# Patient Record
Sex: Male | Born: 1940 | Race: White | Hispanic: No | Smoking: Former smoker
Health system: Southern US, Community
[De-identification: ages and names within clinical notes are randomized; demographics above are authoritative.]

## PROBLEM LIST (undated history)

## (undated) DIAGNOSIS — Z87898 Personal history of other specified conditions: Secondary | ICD-10-CM

## (undated) DIAGNOSIS — R600 Localized edema: Secondary | ICD-10-CM

## (undated) DIAGNOSIS — H9190 Unspecified hearing loss, unspecified ear: Secondary | ICD-10-CM

## (undated) DIAGNOSIS — N183 Chronic kidney disease, stage 3 unspecified: Secondary | ICD-10-CM

## (undated) DIAGNOSIS — Z923 Personal history of irradiation: Secondary | ICD-10-CM

## (undated) DIAGNOSIS — E119 Type 2 diabetes mellitus without complications: Secondary | ICD-10-CM

## (undated) DIAGNOSIS — D649 Anemia, unspecified: Secondary | ICD-10-CM

## (undated) DIAGNOSIS — Z8719 Personal history of other diseases of the digestive system: Secondary | ICD-10-CM

## (undated) DIAGNOSIS — I872 Venous insufficiency (chronic) (peripheral): Secondary | ICD-10-CM

## (undated) DIAGNOSIS — M1990 Unspecified osteoarthritis, unspecified site: Secondary | ICD-10-CM

## (undated) DIAGNOSIS — F1011 Alcohol abuse, in remission: Secondary | ICD-10-CM

## (undated) DIAGNOSIS — I10 Essential (primary) hypertension: Secondary | ICD-10-CM

## (undated) DIAGNOSIS — C61 Malignant neoplasm of prostate: Secondary | ICD-10-CM

## (undated) HISTORY — DX: Essential (primary) hypertension: I10

## (undated) HISTORY — PX: TOTAL KNEE ARTHROPLASTY: SHX125

## (undated) HISTORY — DX: Venous insufficiency (chronic) (peripheral): I87.2

## (undated) HISTORY — PX: TONSILLECTOMY: SUR1361

---

## 1998-11-27 ENCOUNTER — Encounter: Admission: RE | Admit: 1998-11-27 | Discharge: 1998-11-27 | Payer: Self-pay | Admitting: Internal Medicine

## 2002-04-27 ENCOUNTER — Ambulatory Visit (HOSPITAL_COMMUNITY): Admission: RE | Admit: 2002-04-27 | Discharge: 2002-04-27 | Payer: Self-pay | Admitting: Internal Medicine

## 2002-04-27 ENCOUNTER — Encounter: Payer: Self-pay | Admitting: Internal Medicine

## 2004-04-17 ENCOUNTER — Ambulatory Visit (HOSPITAL_COMMUNITY): Admission: RE | Admit: 2004-04-17 | Discharge: 2004-04-17 | Payer: Self-pay | Admitting: General Surgery

## 2004-10-02 ENCOUNTER — Ambulatory Visit: Payer: Self-pay | Admitting: Gastroenterology

## 2004-11-07 ENCOUNTER — Ambulatory Visit (HOSPITAL_COMMUNITY): Admission: RE | Admit: 2004-11-07 | Discharge: 2004-11-07 | Payer: Self-pay | Admitting: Obstetrics and Gynecology

## 2004-11-07 ENCOUNTER — Encounter (INDEPENDENT_AMBULATORY_CARE_PROVIDER_SITE_OTHER): Payer: Self-pay | Admitting: Specialist

## 2005-10-21 ENCOUNTER — Encounter (INDEPENDENT_AMBULATORY_CARE_PROVIDER_SITE_OTHER): Payer: Self-pay | Admitting: Specialist

## 2006-01-31 ENCOUNTER — Ambulatory Visit: Payer: Self-pay | Admitting: Gastroenterology

## 2009-09-07 ENCOUNTER — Ambulatory Visit (HOSPITAL_COMMUNITY): Admission: RE | Admit: 2009-09-07 | Discharge: 2009-09-07 | Payer: Self-pay | Admitting: Family Medicine

## 2009-12-25 ENCOUNTER — Encounter: Payer: Self-pay | Admitting: Infectious Diseases

## 2009-12-27 ENCOUNTER — Inpatient Hospital Stay (HOSPITAL_COMMUNITY): Admission: RE | Admit: 2009-12-27 | Discharge: 2010-01-01 | Payer: Self-pay | Admitting: Orthopedic Surgery

## 2009-12-29 HISTORY — PX: TOTAL KNEE  PROSTHESIS REMOVAL W/ SPACER INSERTION: SHX2541

## 2009-12-30 ENCOUNTER — Ambulatory Visit: Payer: Self-pay | Admitting: Infectious Diseases

## 2010-01-01 ENCOUNTER — Inpatient Hospital Stay: Admission: AD | Admit: 2010-01-01 | Discharge: 2010-02-15 | Payer: Self-pay | Admitting: Internal Medicine

## 2010-01-04 ENCOUNTER — Ambulatory Visit: Payer: Self-pay | Admitting: Infectious Diseases

## 2010-01-04 DIAGNOSIS — E119 Type 2 diabetes mellitus without complications: Secondary | ICD-10-CM

## 2010-01-04 DIAGNOSIS — M86669 Other chronic osteomyelitis, unspecified tibia and fibula: Secondary | ICD-10-CM | POA: Insufficient documentation

## 2010-01-10 ENCOUNTER — Encounter (INDEPENDENT_AMBULATORY_CARE_PROVIDER_SITE_OTHER): Payer: Self-pay | Admitting: *Deleted

## 2010-01-10 DIAGNOSIS — Z8546 Personal history of malignant neoplasm of prostate: Secondary | ICD-10-CM

## 2010-01-10 DIAGNOSIS — I10 Essential (primary) hypertension: Secondary | ICD-10-CM

## 2010-02-21 ENCOUNTER — Ambulatory Visit: Payer: Self-pay | Admitting: Infectious Diseases

## 2010-03-16 ENCOUNTER — Inpatient Hospital Stay (HOSPITAL_COMMUNITY): Admission: RE | Admit: 2010-03-16 | Discharge: 2010-03-19 | Payer: Self-pay | Admitting: Orthopedic Surgery

## 2010-03-16 HISTORY — PX: REIMPLANTATION OF TOTAL KNEE: SHX6052

## 2010-06-08 ENCOUNTER — Ambulatory Visit (HOSPITAL_COMMUNITY): Admission: RE | Admit: 2010-06-08 | Discharge: 2010-06-08 | Payer: Self-pay | Admitting: Family Medicine

## 2010-07-29 ENCOUNTER — Encounter (HOSPITAL_BASED_OUTPATIENT_CLINIC_OR_DEPARTMENT_OTHER): Payer: Self-pay | Admitting: Internal Medicine

## 2010-08-07 NOTE — Miscellaneous (Signed)
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 d/c summary

## 2010-08-07 NOTE — Assessment & Plan Note (Signed)
Summary: per Gunnar Fusi 6wks f/u [mkj]   CC:  6 weeks follow up.  History of Present Illness: 70  yo M with left total knee arthroplasty that was   performed in 2007.  In 2009 the knee became infected.  It was cleaned   out arthroscopically and did well following that.  According to records   and cultures were strep and at the time they thought that this was a contaminant.  The patient was given vancomycin.  He was then seen in Dr Jeannetta Ellis office with    2 week of swelling  and redness about the left knee that is becoming progressively worse.   The patient denied any constitutional symptoms such as fever. He was adm 12-27-09 and had his prosthesis resected 12-30-09. His Cx grew 12-25-09 Streptococcus galolyticus (s-pen) from the office.  He was d/c home on 6-27-11with IV ceftriaxone.  His hospital course was further complicated by the new dx of Diabetes Mellitus.  ESR 113 and CRP 14.8  NO fevers or chills. No wound drainage. off antibiotics. Not wt bearing yet. Has f/u with Dr Darrelyn Hillock 02-23-10.   Preventive Screening-Counseling & Management  Alcohol-Tobacco     Alcohol drinks/day: 0     Smoking Status: never  Caffeine-Diet-Exercise     Caffeine use/day: sodas     Does Patient Exercise: yes     Type of exercise: waiting to start PT  Safety-Violence-Falls     Seat Belt Use: yes  Current Medications (verified): 1)  Torsemide 20 Mg Tabs (Torsemide) .... Take One Tablet By Mouth Daily 2)  Klor-Con 10 10 Meq Cr-Tabs (Potassium Chloride) .... Take One Tablet By Mouth Daily 3)  Coumadin 5 Mg Tabs (Warfarin Sodium) .... Take One Tablet By Mouth Daily 4)  Lantus 100 Unit/ml Soln (Insulin Glargine) .... 20 Units Subcu Q Pm 5)  Metformin Hcl 500 Mg Tabs (Metformin Hcl) .... Take One Tablet By Mouth Twice A Day 6)  Oxycodone-Acetaminophen 10-650 Mg Tabs (Oxycodone-Acetaminophen) .... Take One Tablet By Mouth Every 4 Hours As Needed For Pain 7)  Robaxin 500 Mg Tabs (Methocarbamol) .... Take One Tablet  By Mouth Three Times A Day For Muscle Spasms 8)  Klor-Con M10 10 Meq Cr-Tabs (Potassium Chloride Crys Cr) .... Take 1 Tablet By Mouth Once A Day 9)  Cozaar 100 Mg Tabs (Losartan Potassium) .... Take 1 Tablet By Mouth Once A Day 10)  Coumadin 4 Mg Tabs (Warfarin Sodium) .... 2 Tab At Bedtime 11)  Oxycontin 10 Mg Xr12h-Tab (Oxycodone Hcl) .... Take 1 Tablet By Mouth Two Times A Day  Allergies (verified): No Known Drug Allergies    Updated Prior Medication List: TORSEMIDE 20 MG TABS (TORSEMIDE) Take one tablet by mouth daily KLOR-CON 10 10 MEQ CR-TABS (POTASSIUM CHLORIDE) Take one tablet by mouth daily COUMADIN 5 MG TABS (WARFARIN SODIUM) Take one tablet by mouth daily LANTUS 100 UNIT/ML SOLN (INSULIN GLARGINE) 20 units subcu q pm METFORMIN HCL 500 MG TABS (METFORMIN HCL) Take one tablet by mouth twice a day OXYCODONE-ACETAMINOPHEN 10-650 MG TABS (OXYCODONE-ACETAMINOPHEN) Take one tablet by mouth every 4 hours as needed for pain ROBAXIN 500 MG TABS (METHOCARBAMOL) Take one tablet by mouth three times a day for muscle spasms KLOR-CON M10 10 MEQ CR-TABS (POTASSIUM CHLORIDE CRYS CR) Take 1 tablet by mouth once a day COZAAR 100 MG TABS (LOSARTAN POTASSIUM) Take 1 tablet by mouth once a day COUMADIN 4 MG TABS (WARFARIN SODIUM) 2 tab at bedtime OXYCONTIN 10 MG XR12H-TAB (OXYCODONE HCL) Take 1  tablet by mouth two times a day  Current Allergies (reviewed today): No known allergies  Review of Systems       FSGs run 108-140.   Vital Signs:  Patient profile:   70 year old male Height:      0.0 inches (0 cm) Weight:      000.0 pounds (0 kg) Temp:     98.8 degrees F (37.11 degrees C) oral Pulse rate:   89 / minute BP sitting:   119 / 71  (left arm)  Vitals Entered By: Baxter Hire) (February 21, 2010 11:06 AM) CC: 6 weeks follow up Pain Assessment Patient in pain? yes     Location: left knee Intensity: 3 Type: aching Onset of pain  pain gets worse at night Nutritional Status  Detail appetite is great per patient  Does patient need assistance? Functional Status Self care Ambulation Wheelchair   Physical Exam  General:  well-developed, well-nourished, well-hydrated, and overweight-appearing.   Extremities:  L knee with  ~5cm wound. there is some yellowness of the skin at the inferior portion but no d/c. It is non-tender. there is no fluctuance or effusion.    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 will check his ESR and CRP. If these are normal would suggest he can procede to reimplantation. If they are elavated, would suggest 3-6 months of by mouth antibiotics (amoxil or keflx or levaquin or avelox). He will return to clinic if his labs are abnormal.  The following medications were removed from the medication list:    Rocephin 1 Gm Solr (Ceftriaxone sodium) .Marland Kitchen... Pic line q 24 hours His updated medication list for this problem includes:    Oxycodone-acetaminophen 10-650 Mg Tabs (Oxycodone-acetaminophen) .Marland Kitchen... Take one tablet by mouth every 4 hours as needed for pain    Oxycontin 10 Mg Xr12h-tab (Oxycodone hcl) .Marland Kitchen... Take 1 tablet by mouth two times a day  Orders: Est. Patient Level III (03474) T-C-Reactive Protein (25956-38756) T-Sed Rate (Automated) (43329-51884)  Medications Added to Medication List This Visit: 1)  Klor-con M10 10 Meq Cr-tabs (Potassium chloride crys cr) .... Take 1 tablet by mouth once a day 2)  Cozaar 100 Mg Tabs (Losartan potassium) .... Take 1 tablet by mouth once a day 3)  Coumadin 4 Mg Tabs (Warfarin sodium) .... 2 tab at bedtime 4)  Oxycontin 10 Mg Xr12h-tab (Oxycodone hcl) .... Take 1 tablet by mouth two times a day  Prevention & Chronic Care Immunizations   Influenza vaccine: Not documented    Tetanus booster: Not documented    Pneumococcal vaccine: Not documented    H. zoster vaccine: Not documented  Colorectal Screening   Hemoccult:  Not documented    Colonoscopy: Not documented  Other Screening   PSA: Not documented   Smoking status: never  (02/21/2010)  Diabetes Mellitus   HgbA1C: Not documented    Eye exam: Not documented    Foot exam: Not documented   High risk foot: Not documented   Foot care education: Not documented    Urine microalbumin/creatinine ratio: Not documented  Lipids   Total Cholesterol: Not documented   LDL: Not documented   LDL Direct: Not documented   HDL: Not documented   Triglycerides: Not documented  Hypertension   Last Blood Pressure: 119 / 71  (02/21/2010)   Serum creatinine: Not documented   Serum potassium Not documented  Self-Management Support :    Diabetes self-management  support: Not documented    Hypertension self-management support: Not documented

## 2010-08-07 NOTE — Assessment & Plan Note (Signed)
Summary: hsfu l tkr inf/need chart/jh oupt6/11   CC:  hsfu I tkr inf.  History of Present Illness: 70  yo M with left total knee arthroplasty that was   performed in 2007.  In 2009 the knee became infected.  It was cleaned   out arthroscopically and did well following that.  According to records   and cultures were strep and at the time they thought that this was a contaminant.  The patient was given vancomycin.  He was then seen in Dr Jeannetta Ellis office with    2 week of swelling  and redness about the left knee that is becoming progressively worse.   The patient denied any constitutional symptoms such as fever. He was adm 12-27-09 and had his prosthesis resected 12-30-09. His Cx grew 12-25-09 Streptococcus galolyticus (s-pen) from the office.  He was d/c home on 6-27-11with IV ceftriaxone.  His hospital course was further complicated by the new dx of Diabetes Mellitus.  ESR 113 and CRP 14.8  Toady c/o pain in his leg. no probs with PIC line. He is at [facility]. has had FSG there- last was 170.   Preventive Screening-Counseling & Management  Alcohol-Tobacco     Alcohol drinks/day: 0     Smoking Status: never  Caffeine-Diet-Exercise     Caffeine use/day: tea     Does Patient Exercise: yes     Type of exercise: starting PT  Safety-Violence-Falls     Seat Belt Use: yes      Drug Use:  yes.     Updated Prior Medication List: TORSEMIDE 20 MG TABS (TORSEMIDE) Take one tablet by mouth daily KLOR-CON 10 10 MEQ CR-TABS (POTASSIUM CHLORIDE) Take one tablet by mouth daily COUMADIN 5 MG TABS (WARFARIN SODIUM) Take one tablet by mouth daily ROCEPHIN 1 GM SOLR (CEFTRIAXONE SODIUM) PIC line q 24 hours LANTUS 100 UNIT/ML SOLN (INSULIN GLARGINE) 20 units subcu q pm METFORMIN HCL 500 MG TABS (METFORMIN HCL) Take one tablet by mouth twice a day OXYCODONE-ACETAMINOPHEN 10-650 MG TABS (OXYCODONE-ACETAMINOPHEN) Take one tablet by mouth every 4 hours as needed for pain ROBAXIN 500 MG TABS  (METHOCARBAMOL) Take one tablet by mouth three times a day for muscle spasms KEFLEX 500 MG CAPS (CEPHALEXIN) four times a day for 5 days  Current Allergies (reviewed today): No known allergies  Past History:  Past Medical History: Current Problems:  DIABETES MELLITUS, TYPE II (ICD-250.00) OSTEOMYELITIS, CHRONIC, 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  Social History: Never Smoked Alcohol use-yes- 5 drinks /day Drug Use:  yes  Review of Systems       no diarrhea, has had constipation, no fever/chills. no paresthesias. having LE edema with being up all day.   Vital Signs:  Patient profile:   70 year old male Temp:     98.6 degrees F oral Pulse rate:   109 / minute BP sitting:   100 / 73  (left arm) CC: hsfu I tkr inf Pain Assessment Patient in pain? yes     Location: left leg Intensity: 7 Type: aching Onset of pain  pain comes and goes  Does patient need assistance? Functional Status Self care Ambulation Wheelchair   Physical Exam  General:  well-developed, well-nourished, well-hydrated, and overweight-appearing.   Eyes:  pupils equal, pupils round, and pupils reactive to light.   Mouth:  pharynx pink and moist and no exudates.   Neck:  no masses.   Lungs:  normal respiratory effort and normal breath sounds.  Heart:  normal rate, regular rhythm, and no murmur.   Abdomen:  soft, non-tender, and normal bowel sounds.   Extremities:  RUE PIC- clean, nontender, no d/c. L Knee- well healed except for 4 x 2 cm open wound midway. no d/c. non-tender. 3+ BLE edema.    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 adds nothing to coverage. will cont his ceftriaxone for at least 6 weeks, guide his therapy by his wound healing as well as his ESR and CRP.  will see him back in 6 weeks to re-asses.  His updated medication list for this problem includes:    Rocephin 1 Gm Solr (Ceftriaxone  sodium) .Marland Kitchen... Pic line q 24 hours    Oxycodone-acetaminophen 10-650 Mg Tabs (Oxycodone-acetaminophen) .Marland Kitchen... Take one tablet by mouth every 4 hours as needed for pain  Orders: New Patient Level IV (16109)  Problem # 2:  DIABETES MELLITUS, TYPE II (ICD-250.00) he will con tto get f[facility] for this at the [facility]. Diabetic control will key to healing.  His updated medication list for this problem includes:    Lantus 100 Unit/ml Soln (Insulin glargine) .Marland Kitchen... 20 units subcu q pm    Metformin Hcl 500 Mg Tabs (Metformin hcl) .Marland Kitchen... Take one tablet by mouth twice a day  Medications Added to Medication List This Visit: 1)  Torsemide 20 Mg Tabs (Torsemide) .... Take one tablet by mouth daily 2)  Klor-con 10 10 Meq Cr-tabs (Potassium chloride) .... Take one tablet by mouth daily 3)  Coumadin 5 Mg Tabs (Warfarin sodium) .... Take one tablet by mouth daily 4)  Rocephin 1 Gm Solr (Ceftriaxone sodium) .... Pic line q 24 hours 5)  Lantus 100 Unit/ml Soln (Insulin glargine) .... 20 units subcu q pm 6)  Metformin Hcl 500 Mg Tabs (Metformin hcl) .... Take one tablet by mouth twice a day 7)  Oxycodone-acetaminophen 10-650 Mg Tabs (Oxycodone-acetaminophen) .... Take one tablet by mouth every 4 hours as needed for pain 8)  Robaxin 500 Mg Tabs (Methocarbamol) .... Take one tablet by mouth three times a day for muscle spasms

## 2010-08-07 NOTE — Miscellaneous (Signed)
Summary: HIPAA Restriction  HIPAA Restriction   Imported By: Florinda Marker 01/05/2010 14:34:11  _____________________________________________________________________  External Attachment:    Type:   Image     Comment:   External Document

## 2010-09-20 LAB — GRAM STAIN

## 2010-09-20 LAB — PROTIME-INR: INR: 1.08 (ref 0.00–1.49)

## 2010-09-20 LAB — COMPREHENSIVE METABOLIC PANEL
Albumin: 4.3 g/dL (ref 3.5–5.2)
Alkaline Phosphatase: 82 U/L (ref 39–117)
BUN: 12 mg/dL (ref 6–23)
Calcium: 9.7 mg/dL (ref 8.4–10.5)
Creatinine, Ser: 0.89 mg/dL (ref 0.4–1.5)
GFR calc Af Amer: >60 mL/min (ref >60)
GFR calc non Af Amer: >60 mL/min (ref >60)
Potassium: 4.4 mEq/L (ref 3.5–5.1)
Sodium: 140 mEq/L (ref 135–145)
Total Protein: 8 g/dL (ref 6.0–8.3)

## 2010-09-20 LAB — BASIC METABOLIC PANEL
Calcium: 8.8 mg/dL (ref 8.4–10.5)
Chloride: 105 mEq/L (ref 96–112)
Chloride: 106 mEq/L (ref 96–112)
Creatinine, Ser: 0.77 mg/dL (ref 0.4–1.5)
Creatinine, Ser: 0.79 mg/dL (ref 0.4–1.5)
GFR calc Af Amer: >60 mL/min (ref >60)
GFR calc Af Amer: >60 mL/min (ref >60)
GFR calc non Af Amer: >60 mL/min (ref >60)
GFR calc non Af Amer: >60 mL/min (ref >60)
Sodium: 137 mEq/L (ref 135–145)

## 2010-09-20 LAB — GLUCOSE, CAPILLARY
Glucose-Capillary: 129 mg/dL — ABNORMAL HIGH (ref 70–99)
Glucose-Capillary: 134 mg/dL — ABNORMAL HIGH (ref 70–99)
Glucose-Capillary: 137 mg/dL — ABNORMAL HIGH (ref 70–99)
Glucose-Capillary: 141 mg/dL — ABNORMAL HIGH (ref 70–99)
Glucose-Capillary: 145 mg/dL — ABNORMAL HIGH (ref 70–99)
Glucose-Capillary: 149 mg/dL — ABNORMAL HIGH (ref 70–99)
Glucose-Capillary: 157 mg/dL — ABNORMAL HIGH (ref 70–99)
Glucose-Capillary: 98 mg/dL (ref 70–99)

## 2010-09-20 LAB — CBC
Hemoglobin: 13.5 g/dL (ref 13.0–17.0)
MCH: 31.5 pg (ref 26.0–34.0)
MCH: 31.6 pg (ref 26.0–34.0)
MCHC: 34.4 g/dL (ref 30.0–36.0)
MCV: 91.8 fL (ref 78.0–100.0)
Platelets: 162 10*3/uL (ref 150–400)
Platelets: 233 10*3/uL (ref 150–400)
RBC: 3.24 MIL/uL — ABNORMAL LOW (ref 4.22–5.81)
RBC: 4.27 MIL/uL (ref 4.22–5.81)
WBC: 8.9 10*3/uL (ref 4.0–10.5)
WBC: 9.4 10*3/uL (ref 4.0–10.5)
WBC: 9.5 10*3/uL (ref 4.0–10.5)

## 2010-09-20 LAB — URINALYSIS, ROUTINE W REFLEX MICROSCOPIC
Hgb urine dipstick: NEGATIVE
Ketones, ur: NEGATIVE mg/dL
Nitrite: NEGATIVE
Protein, ur: NEGATIVE mg/dL
Specific Gravity, Urine: 1.022 (ref 1.005–1.030)
Urobilinogen, UA: 0.2 mg/dL (ref 0.0–1.0)
pH: 5 (ref 5.0–8.0)

## 2010-09-20 LAB — CULTURE, ROUTINE-ABSCESS: Culture: NO GROWTH

## 2010-09-20 LAB — SURGICAL PCR SCREEN
MRSA, PCR: NEGATIVE
Staphylococcus aureus: NEGATIVE

## 2010-09-20 LAB — DIFFERENTIAL
Basophils Absolute: 0.1 10*3/uL (ref 0.0–0.1)
Basophils Relative: 2 % — ABNORMAL HIGH (ref 0–1)
Lymphocytes Relative: 19 % (ref 12–46)
Monocytes Relative: 8 % (ref 3–12)
Neutro Abs: 6 10*3/uL (ref 1.7–7.7)
Neutrophils Relative %: 67 % (ref 43–77)

## 2010-09-20 LAB — TYPE AND SCREEN: Antibody Screen: NEGATIVE

## 2010-09-20 LAB — ANAEROBIC CULTURE

## 2010-09-21 LAB — GLUCOSE, CAPILLARY
Glucose-Capillary: 101 mg/dL — ABNORMAL HIGH (ref 70–99)
Glucose-Capillary: 103 mg/dL — ABNORMAL HIGH (ref 70–99)
Glucose-Capillary: 119 mg/dL — ABNORMAL HIGH (ref 70–99)
Glucose-Capillary: 125 mg/dL — ABNORMAL HIGH (ref 70–99)
Glucose-Capillary: 136 mg/dL — ABNORMAL HIGH (ref 70–99)
Glucose-Capillary: 174 mg/dL — ABNORMAL HIGH (ref 70–99)
Glucose-Capillary: 183 mg/dL — ABNORMAL HIGH (ref 70–99)
Glucose-Capillary: 184 mg/dL — ABNORMAL HIGH (ref 70–99)
Glucose-Capillary: 84 mg/dL (ref 70–99)
Glucose-Capillary: 89 mg/dL (ref 70–99)

## 2010-09-22 LAB — GLUCOSE, CAPILLARY
Glucose-Capillary: 101 mg/dL — ABNORMAL HIGH (ref 70–99)
Glucose-Capillary: 101 mg/dL — ABNORMAL HIGH (ref 70–99)
Glucose-Capillary: 102 mg/dL — ABNORMAL HIGH (ref 70–99)
Glucose-Capillary: 106 mg/dL — ABNORMAL HIGH (ref 70–99)
Glucose-Capillary: 106 mg/dL — ABNORMAL HIGH (ref 70–99)
Glucose-Capillary: 106 mg/dL — ABNORMAL HIGH (ref 70–99)
Glucose-Capillary: 107 mg/dL — ABNORMAL HIGH (ref 70–99)
Glucose-Capillary: 108 mg/dL — ABNORMAL HIGH (ref 70–99)
Glucose-Capillary: 118 mg/dL — ABNORMAL HIGH (ref 70–99)
Glucose-Capillary: 118 mg/dL — ABNORMAL HIGH (ref 70–99)
Glucose-Capillary: 130 mg/dL — ABNORMAL HIGH (ref 70–99)
Glucose-Capillary: 143 mg/dL — ABNORMAL HIGH (ref 70–99)
Glucose-Capillary: 160 mg/dL — ABNORMAL HIGH (ref 70–99)
Glucose-Capillary: 172 mg/dL — ABNORMAL HIGH (ref 70–99)
Glucose-Capillary: 201 mg/dL — ABNORMAL HIGH (ref 70–99)
Glucose-Capillary: 97 mg/dL (ref 70–99)
Glucose-Capillary: 98 mg/dL (ref 70–99)

## 2010-09-23 LAB — GLUCOSE, CAPILLARY
Glucose-Capillary: 101 mg/dL — ABNORMAL HIGH (ref 70–99)
Glucose-Capillary: 108 mg/dL — ABNORMAL HIGH (ref 70–99)
Glucose-Capillary: 112 mg/dL — ABNORMAL HIGH (ref 70–99)
Glucose-Capillary: 113 mg/dL — ABNORMAL HIGH (ref 70–99)
Glucose-Capillary: 114 mg/dL — ABNORMAL HIGH (ref 70–99)
Glucose-Capillary: 118 mg/dL — ABNORMAL HIGH (ref 70–99)
Glucose-Capillary: 119 mg/dL — ABNORMAL HIGH (ref 70–99)
Glucose-Capillary: 130 mg/dL — ABNORMAL HIGH (ref 70–99)
Glucose-Capillary: 134 mg/dL — ABNORMAL HIGH (ref 70–99)
Glucose-Capillary: 135 mg/dL — ABNORMAL HIGH (ref 70–99)
Glucose-Capillary: 137 mg/dL — ABNORMAL HIGH (ref 70–99)
Glucose-Capillary: 139 mg/dL — ABNORMAL HIGH (ref 70–99)
Glucose-Capillary: 141 mg/dL — ABNORMAL HIGH (ref 70–99)
Glucose-Capillary: 150 mg/dL — ABNORMAL HIGH (ref 70–99)
Glucose-Capillary: 151 mg/dL — ABNORMAL HIGH (ref 70–99)
Glucose-Capillary: 151 mg/dL — ABNORMAL HIGH (ref 70–99)
Glucose-Capillary: 152 mg/dL — ABNORMAL HIGH (ref 70–99)
Glucose-Capillary: 153 mg/dL — ABNORMAL HIGH (ref 70–99)
Glucose-Capillary: 164 mg/dL — ABNORMAL HIGH (ref 70–99)
Glucose-Capillary: 166 mg/dL — ABNORMAL HIGH (ref 70–99)
Glucose-Capillary: 170 mg/dL — ABNORMAL HIGH (ref 70–99)
Glucose-Capillary: 171 mg/dL — ABNORMAL HIGH (ref 70–99)
Glucose-Capillary: 172 mg/dL — ABNORMAL HIGH (ref 70–99)
Glucose-Capillary: 175 mg/dL — ABNORMAL HIGH (ref 70–99)
Glucose-Capillary: 181 mg/dL — ABNORMAL HIGH (ref 70–99)
Glucose-Capillary: 183 mg/dL — ABNORMAL HIGH (ref 70–99)
Glucose-Capillary: 186 mg/dL — ABNORMAL HIGH (ref 70–99)
Glucose-Capillary: 189 mg/dL — ABNORMAL HIGH (ref 70–99)
Glucose-Capillary: 196 mg/dL — ABNORMAL HIGH (ref 70–99)
Glucose-Capillary: 198 mg/dL — ABNORMAL HIGH (ref 70–99)
Glucose-Capillary: 224 mg/dL — ABNORMAL HIGH (ref 70–99)
Glucose-Capillary: 224 mg/dL — ABNORMAL HIGH (ref 70–99)
Glucose-Capillary: 226 mg/dL — ABNORMAL HIGH (ref 70–99)
Glucose-Capillary: 233 mg/dL — ABNORMAL HIGH (ref 70–99)
Glucose-Capillary: 238 mg/dL — ABNORMAL HIGH (ref 70–99)
Glucose-Capillary: 273 mg/dL — ABNORMAL HIGH (ref 70–99)
Glucose-Capillary: 405 mg/dL — ABNORMAL HIGH (ref 70–99)
Glucose-Capillary: 94 mg/dL (ref 70–99)
Glucose-Capillary: 99 mg/dL (ref 70–99)

## 2010-09-23 LAB — BASIC METABOLIC PANEL
BUN: 5 mg/dL — ABNORMAL LOW (ref 6–23)
CO2: 27 mEq/L (ref 19–32)
CO2: 27 mEq/L (ref 19–32)
Calcium: 8 mg/dL — ABNORMAL LOW (ref 8.4–10.5)
Chloride: 100 mEq/L (ref 96–112)
Chloride: 103 mEq/L (ref 96–112)
Creatinine, Ser: 0.64 mg/dL (ref 0.4–1.5)
Creatinine, Ser: 0.68 mg/dL (ref 0.4–1.5)
GFR calc Af Amer: >60 mL/min (ref >60)
GFR calc Af Amer: >60 mL/min (ref >60)
GFR calc non Af Amer: >60 mL/min (ref >60)
GFR calc non Af Amer: >60 mL/min (ref >60)
Glucose, Bld: 170 mg/dL — ABNORMAL HIGH (ref 70–99)
Potassium: 4.5 mEq/L (ref 3.5–5.1)
Sodium: 133 mEq/L — ABNORMAL LOW (ref 135–145)
Sodium: 135 mEq/L (ref 135–145)
Sodium: 135 mEq/L (ref 135–145)

## 2010-09-23 LAB — CBC
HCT: 38 % — ABNORMAL LOW (ref 39.0–52.0)
MCV: 96.7 fL (ref 78.0–100.0)
Platelets: 286 10*3/uL (ref 150–400)
RBC: 3.93 MIL/uL — ABNORMAL LOW (ref 4.22–5.81)
WBC: 12.1 10*3/uL — ABNORMAL HIGH (ref 4.0–10.5)

## 2010-09-23 LAB — COMPREHENSIVE METABOLIC PANEL
ALT: 33 U/L (ref 0–53)
AST: 40 U/L — ABNORMAL HIGH (ref 0–37)
Albumin: 2.7 g/dL — ABNORMAL LOW (ref 3.5–5.2)
Alkaline Phosphatase: 76 U/L (ref 39–117)
Chloride: 95 mEq/L — ABNORMAL LOW (ref 96–112)
GFR calc Af Amer: >60 mL/min (ref >60)
Potassium: 4.9 mEq/L (ref 3.5–5.1)
Sodium: 129 mEq/L — ABNORMAL LOW (ref 135–145)
Total Bilirubin: 0.7 mg/dL (ref 0.3–1.2)

## 2010-09-23 LAB — HEMOGLOBIN AND HEMATOCRIT, BLOOD
HCT: 29.4 % — ABNORMAL LOW (ref 39.0–52.0)
HCT: 29.5 % — ABNORMAL LOW (ref 39.0–52.0)
HCT: 34.8 % — ABNORMAL LOW (ref 39.0–52.0)
Hemoglobin: 11.4 g/dL — ABNORMAL LOW (ref 13.0–17.0)
Hemoglobin: 9.9 g/dL — ABNORMAL LOW (ref 13.0–17.0)

## 2010-09-23 LAB — CROSSMATCH

## 2010-09-23 LAB — URINALYSIS, ROUTINE W REFLEX MICROSCOPIC
Bilirubin Urine: NEGATIVE
Specific Gravity, Urine: 1.014 (ref 1.005–1.030)
Urobilinogen, UA: 0.2 mg/dL (ref 0.0–1.0)
pH: 5 (ref 5.0–8.0)

## 2010-09-23 LAB — PROTIME-INR
INR: 1.23 (ref 0.00–1.49)
Prothrombin Time: 15.4 seconds — ABNORMAL HIGH (ref 11.6–15.2)
Prothrombin Time: 16.9 seconds — ABNORMAL HIGH (ref 11.6–15.2)
Prothrombin Time: 17.3 seconds — ABNORMAL HIGH (ref 11.6–15.2)

## 2010-09-23 LAB — URINE MICROSCOPIC-ADD ON

## 2010-09-23 LAB — MRSA PCR SCREENING: MRSA by PCR: NEGATIVE

## 2010-09-23 LAB — C-REACTIVE PROTEIN: CRP: 14.8 mg/dL — ABNORMAL HIGH (ref <0.6)

## 2010-09-23 LAB — HEMOGLOBIN A1C: Mean Plasma Glucose: 229 mg/dL — ABNORMAL HIGH (ref <117)

## 2010-09-23 LAB — SEDIMENTATION RATE: Sed Rate: 113 mm/hr — ABNORMAL HIGH (ref 0–16)

## 2010-09-26 ENCOUNTER — Encounter: Payer: Self-pay | Admitting: *Deleted

## 2010-11-23 NOTE — H&P (Signed)
NAMECOPELAND, NEISEN                 ACCOUNT NO.:  [id number]   MEDICAL RECORD NO.:  [id number]           PATIENT TYPE:   LOCATION:                                 FACILITY:   PHYSICIAN:  Dalia Heading, M.D.  DATE OF BIRTH:  1941-02-06   DATE OF ADMISSION:  04/17/2004  DATE OF DISCHARGE:  LH                                HISTORY & PHYSICAL   CHIEF COMPLAINT:  Need for screening colonoscopy.   HISTORY OF PRESENT ILLNESS:  The patient is a 70 year old white male who is  referred for endoscopic evaluation.  He needs a colonoscopy for screening  purposes.  No abdominal pain, weight loss, nausea, vomiting, diarrhea, or  constipation have been noted.  He has never had a colonoscopy.  There is no  family history of colon carcinoma.   PAST MEDICAL HISTORY:  Hypertension.   PAST SURGICAL HISTORY:  Arthroscopy on the knee.   CURRENT MEDICATIONS:  A blood pressure pill.   ALLERGIES:  No known drug allergies.   REVIEW OF SYSTEMS:  Noncontributory.   PHYSICAL EXAMINATION:  GENERAL:  The patient is a well-developed, well-  nourished white male in no acute distress.  VITAL SIGNS:  He is afebrile, and vital signs are stable.  CHEST:  Lungs are clear to auscultation with equal breath sounds  bilaterally.  CARDIAC:  Regular rate and rhythm without S3, S4, or murmurs.  ABDOMEN:  Soft, nontender, nondistended.  No hepatosplenomegaly or masses  are noted.  RECTAL:  Deferred to the procedure.   IMPRESSION:  Need for screening colonoscopy.   PLAN:  The patient is scheduled for a colonoscopy on April 17, 2004.  The  risks and benefits of the procedure, including bleeding and perforation,  were fully explained to the patient, who gave informed consent.       ___________________________________________  Dalia Heading, M.D.    MAJ/MEDQ  D:  04/10/2004  T:  04/10/2004  Job:  [id number]   cc:   Patrica Duel, M.D.  [address]  [place]  [place] [address]  Fax: [phone]

## 2011-05-06 ENCOUNTER — Other Ambulatory Visit (HOSPITAL_COMMUNITY): Payer: Self-pay | Admitting: Physician Assistant

## 2011-05-06 DIAGNOSIS — K7689 Other specified diseases of liver: Secondary | ICD-10-CM

## 2011-05-09 ENCOUNTER — Other Ambulatory Visit (HOSPITAL_COMMUNITY): Payer: Medicare Other

## 2011-05-10 ENCOUNTER — Ambulatory Visit (HOSPITAL_COMMUNITY)
Admission: RE | Admit: 2011-05-10 | Discharge: 2011-05-10 | Disposition: A | Discharge status: Home / Self Care | Payer: Medicare Other | Source: Ambulatory Visit | Attending: Physician Assistant | Admitting: Physician Assistant

## 2011-05-10 DIAGNOSIS — K8020 Calculus of gallbladder without cholecystitis without obstruction: Secondary | ICD-10-CM | POA: Insufficient documentation

## 2011-05-10 DIAGNOSIS — R748 Abnormal levels of other serum enzymes: Secondary | ICD-10-CM | POA: Insufficient documentation

## 2011-05-10 DIAGNOSIS — K7689 Other specified diseases of liver: Secondary | ICD-10-CM

## 2011-05-15 ENCOUNTER — Encounter (INDEPENDENT_AMBULATORY_CARE_PROVIDER_SITE_OTHER): Payer: Self-pay | Admitting: Internal Medicine

## 2011-05-23 ENCOUNTER — Ambulatory Visit (INDEPENDENT_AMBULATORY_CARE_PROVIDER_SITE_OTHER): Payer: Medicare Other | Admitting: Internal Medicine

## 2011-05-28 ENCOUNTER — Ambulatory Visit (INDEPENDENT_AMBULATORY_CARE_PROVIDER_SITE_OTHER): Payer: Medicare Other | Admitting: Internal Medicine

## 2011-05-28 ENCOUNTER — Encounter (INDEPENDENT_AMBULATORY_CARE_PROVIDER_SITE_OTHER): Payer: Self-pay | Admitting: Internal Medicine

## 2011-05-28 VITALS — BP 112/80 | HR 80 | Temp 98.5°F | Ht 67.0 in | Wt 277.9 lb

## 2011-05-28 DIAGNOSIS — K760 Fatty (change of) liver, not elsewhere classified: Secondary | ICD-10-CM

## 2011-05-28 DIAGNOSIS — B1920 Unspecified viral hepatitis C without hepatic coma: Secondary | ICD-10-CM

## 2011-05-28 DIAGNOSIS — R935 Abnormal findings on diagnostic imaging of other abdominal regions, including retroperitoneum: Secondary | ICD-10-CM

## 2011-05-28 DIAGNOSIS — K7689 Other specified diseases of liver: Secondary | ICD-10-CM

## 2011-05-28 LAB — CBC WITH DIFFERENTIAL/PLATELET
Basophils Absolute: 0 10*3/uL (ref 0.0–0.1)
Basophils Relative: 1 % (ref 0–1)
Eosinophils Absolute: 0.2 10*3/uL (ref 0.0–0.7)
Eosinophils Relative: 2 % (ref 0–5)
MCH: 34.5 pg — ABNORMAL HIGH (ref 26.0–34.0)
MCHC: 34.6 g/dL (ref 30.0–36.0)
MCV: 99.5 fL (ref 78.0–100.0)
Platelets: 372 10*3/uL (ref 150–400)
RDW: 13 % (ref 11.5–15.5)

## 2011-05-28 LAB — COMPREHENSIVE METABOLIC PANEL
BUN: 14 mg/dL (ref 6–23)
CO2: 20 mEq/L (ref 19–32)
Creat: 0.93 mg/dL (ref 0.50–1.35)
Glucose, Bld: 194 mg/dL — ABNORMAL HIGH (ref 70–99)
Sodium: 133 mEq/L — ABNORMAL LOW (ref 135–145)
Total Bilirubin: 1.2 mg/dL (ref 0.3–1.2)
Total Protein: 7.3 g/dL (ref 6.0–8.3)

## 2011-05-28 NOTE — Patient Instructions (Signed)
Will get labs. OV in 3 months.

## 2011-05-28 NOTE — Progress Notes (Signed)
Subjective:     Patient ID: Colin [name], male   DOB: 01-23-1941, 70 y.o.   MRN: [id number]  HPI  Colin [name] is a 70 yr old male referred fatty liver/Hepatitis C.  He says he has known he had Hepatitis C for 30-35 yrs.  He has never undergone treatment for Hepatitis C. 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 He denies any problems. No abdominal pain. No prior history of jaundice. Appetite is good. No weight loss.  BM x 1 a day. No melena or bright red rectal bleeding.   05/10/2011 US abdomen: cholelithiasis without evidence of acute cholecystitis. Significant increased echogenicity of the liver, which could be related to fatty infiltration, cirrhosis or some infiltrative disorder.  No gross hepatic mass identified though intrahepatic assessment is limited and intrahepatic pathology is not excluded by this exam. CBD upper normal caliber 6mm diameter.  05/14/2011 Bilirubin 1.5mg , ALP 57, AST 86, ALT 83,  Total protein 7.4, Hep A AB igM negative, Hep B Core AB igM  negative, Hep B S antigen negative,  Hep C antibody reactive Review of Systems see hpi     Current Outpatient Prescriptions  Medication Sig Dispense Refill  . losartan (COZAAR) 100 MG tablet Take 100 mg by mouth daily.        . metFORMIN (GLUCOPHAGE) 500 MG tablet Take 500 mg by mouth 2 (two) times daily.        . Multiple Vitamins-Minerals (MULTIVITAMIN WITH MINERALS) tablet Take 1 tablet by mouth daily.        Marland Kitchen oxyCODONE-acetaminophen (PERCOCET) 10-650 MG per tablet Take 1 tablet by mouth every 4 (four) hours as needed.        . potassium chloride (KLOR-CON 10) 10 MEQ CR tablet Take 10 mEq by mouth daily.        Marland Kitchen torsemide (DEMADEX) 20 MG tablet Take 20 mg by mouth daily.         Past Surgical History  Procedure Date  . Replacement total knee     bilateral   Past Medical History  Diagnosis Date  . Diabetes mellitus   . Hypertension   . Prostate cancer    History   Social History  . Marital Status: Divorced   Spouse Name: N/A    Number of Children: N/A  . Years of Education: N/A   Occupational History  . Not on file.   Social History Main Topics  . Smoking status: Never Smoker   . Smokeless tobacco: Not on file  . Alcohol Use: Yes     3-4 drinks a day  . Drug Use: Yes     Previous IV drug user ovr 30 yrs ago  . Sexually Active: Not on file   Other Topics Concern  . Not on file   Social History Narrative  . No narrative on file   No Known Allergies    Objective:   Physical Exam Filed Vitals:   05/28/11 1109  BP: 112/80  Pulse: 80  Temp: 98.5 F (36.9 C)  Height: 5\' 7"  (1.702 m)  Weight: 277 lb 14.4 oz (126.055 kg)    Alert and oriented. Skin warm and dry. Oral mucosa is moist. Natural teeth in good condition. Sclera anicteric, conjunctivae is pink. Thyroid not enlarged. No cervical lymphadenopathy. Lungs clear. Heart regular rate and rhythm.  Abdomen is soft. Bowel sounds are positive. No hepatomegaly. No abdominal masses felt. No tenderness.  No edema to lower extremities. Patient is alert and oriented.  Assessment:    Fatty Liver, Hepatitis C. He has never received treatment or followed for his Hepatitis C    Plan:     CT abdomen with CM.   AFP, PT/INR Hep C RNA Quant., Hep C Qualitative   OV in 3 months.  Need Hepatitis A and B vaccine. In future may need US guided needle biopsy.

## 2011-05-29 LAB — HEPATITIS C RNA QUANTITATIVE
HCV Quantitative Log: 6.1 {Log} — ABNORMAL HIGH (ref <1.63)
HCV Quantitative: 1270000 IU/mL — ABNORMAL HIGH (ref <43)

## 2011-05-31 ENCOUNTER — Ambulatory Visit (HOSPITAL_COMMUNITY)
Admission: RE | Admit: 2011-05-31 | Discharge: 2011-05-31 | Disposition: A | Discharge status: Home / Self Care | Payer: Medicare Other | Source: Ambulatory Visit | Attending: Internal Medicine | Admitting: Internal Medicine

## 2011-05-31 DIAGNOSIS — R935 Abnormal findings on diagnostic imaging of other abdominal regions, including retroperitoneum: Secondary | ICD-10-CM

## 2011-05-31 DIAGNOSIS — K7689 Other specified diseases of liver: Secondary | ICD-10-CM | POA: Insufficient documentation

## 2011-05-31 DIAGNOSIS — B1920 Unspecified viral hepatitis C without hepatic coma: Secondary | ICD-10-CM

## 2011-05-31 DIAGNOSIS — K8020 Calculus of gallbladder without cholecystitis without obstruction: Secondary | ICD-10-CM | POA: Insufficient documentation

## 2011-05-31 DIAGNOSIS — K760 Fatty (change of) liver, not elsewhere classified: Secondary | ICD-10-CM

## 2011-05-31 MED ORDER — IOHEXOL 300 MG/ML  SOLN
100.0000 mL | Freq: Once | INTRAMUSCULAR | Status: AC | PRN
  Administered 2011-05-31: 100 mL via INTRAVENOUS

## 2011-07-09 HISTORY — PX: ENDOVENOUS ABLATION SAPHENOUS VEIN W/ LASER: SUR449

## 2011-07-31 ENCOUNTER — Encounter (INDEPENDENT_AMBULATORY_CARE_PROVIDER_SITE_OTHER): Payer: Self-pay | Admitting: *Deleted

## 2011-08-08 ENCOUNTER — Ambulatory Visit: Payer: Medicare Other | Admitting: Gastroenterology

## 2011-08-28 ENCOUNTER — Ambulatory Visit (INDEPENDENT_AMBULATORY_CARE_PROVIDER_SITE_OTHER): Payer: Medicare Other | Admitting: Internal Medicine

## 2011-08-28 ENCOUNTER — Encounter (INDEPENDENT_AMBULATORY_CARE_PROVIDER_SITE_OTHER): Payer: Self-pay | Admitting: Internal Medicine

## 2011-08-28 VITALS — BP 132/80 | HR 72 | Temp 98.3°F | Ht 67.0 in | Wt 283.2 lb

## 2011-08-28 DIAGNOSIS — B1920 Unspecified viral hepatitis C without hepatic coma: Secondary | ICD-10-CM

## 2011-08-28 NOTE — Patient Instructions (Signed)
Health dept for Hep  A and B vaccine

## 2011-08-28 NOTE — Progress Notes (Signed)
Subjective:     Patient ID: Colin [name], male   DOB: 1941-01-31, 71 y.o.   MRN: [id number]  HPI Colin [name] presents today for f/u of his Hepatitis C.  He has had Hepatitis C for over 20 yrs.  He was previously an IV drug user. Appetite is good. No weight loss. No abdominal pain. No ascites. No confusion. No lower leg extremity. He has BM at least once a day. No melena or bright red rectal bleeding.  05/28/2011 H and H 15.0 and 43.3, MCV 99.5, ALP 145, AST 92, ALT 90, Albumin 4.2 Hep C RNA Quantiative: 127000, PT/INR  PT 13.4, INR 0.90, AFP 6.2 Hep A ABa IGM negative. Hep B AB IGM negative  05/31/2011 CT abdomen with CT: Fatty infiltration of the liver. Gallstones. 05/10/2011 US abdomen: IMPRESSION:  Cholelithiasis without evidence of acute cholecystitis.  Inadequate visualization of the aorta, pancreas and IVC.  Significantly increased echogenicity of the liver, which could be  related to fatty infiltration, cirrhosis or some infiltrative  disorders.  No gross hepatic mass identified though intrahepatic assessment is  limited and intrahepatic pathology is not excluded by this exam.  If better assessment of the liver is required, recommend CT or MR  imaging with IV contrast.   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 Moderate steatosis.  Review of Systems see hpi Current Outpatient Prescriptions  Medication Sig Dispense Refill  . losartan (COZAAR) 100 MG tablet Take 100 mg by mouth daily.        . metFORMIN (GLUCOPHAGE) 500 MG tablet Take 500 mg by mouth 2 (two) times daily.        . Multiple Vitamins-Minerals (MULTIVITAMIN WITH MINERALS) tablet Take 1 tablet by mouth daily.        Marland Kitchen oxyCODONE-acetaminophen (PERCOCET) 10-650 MG per tablet Take 1 tablet by mouth every 4 (four) hours as needed.        . potassium chloride (KLOR-CON 10) 10 MEQ CR tablet Take 10 mEq by mouth daily.        Marland Kitchen torsemide  (DEMADEX) 20 MG tablet Take 20 mg by mouth daily.         Past Medical History  Diagnosis Date  . Diabetes mellitus   . Hypertension   . Prostate cancer   . 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 Hepatitis C    Past Surgical History  Procedure Date  . Replacement total knee     bilateral   Family Status  Relation Status Death Age  . Mother Deceased     age 47 Alzheimer's  . Father Deceased     80 Alzheimer's  . Brother Alive     good health   History   Social History  . Marital Status: Divorced    Spouse Name: N/A    Number of Children: N/A  . Years of Education: N/A   Occupational History  . Not on file.   Social History Main Topics  . Smoking status: Never Smoker   . Smokeless tobacco: Not on file  . Alcohol Use: Yes     3-4 drinks a day  . Drug Use: Yes     Previous IV drug user ovr 30 yrs ago  . Sexually Active: Not on file   Other Topics Concern  . Not on file   Social History Narrative  . No narrative on file   No Known Allergies  Objective:   Physical Exam Filed Vitals:   08/28/11 1018  Height: 5\' 7"  (1.702 m)  Weight: 283 lb 3.2 oz (128.459 kg)  Alert and oriented. Skin warm and dry. Oral mucosa is moist.   . Sclera anicteric, conjunctivae is pink. Thyroid not enlarged. No cervical lymphadenopathy. Lungs clear. Heart regular rate and rhythm.  Abdomen is soft. Bowel sounds are positive. Unable to palpate liver. No abdominal masses felt. No tenderness.  No edema to L lower extremities, 2+ edema to rt lower extremitiy. Patient is alert and oriented.       Assessment:    Hepatitis C. Patient does not want to seek treatment at this time. Advised to go to Health Dept for Hep A and B vaccine    Plan:    He may follow up on a prn basis.

## 2011-11-05 ENCOUNTER — Encounter (INDEPENDENT_AMBULATORY_CARE_PROVIDER_SITE_OTHER): Payer: Self-pay

## 2012-04-10 ENCOUNTER — Other Ambulatory Visit: Payer: Self-pay

## 2012-04-10 DIAGNOSIS — M7989 Other specified soft tissue disorders: Secondary | ICD-10-CM

## 2012-04-13 ENCOUNTER — Encounter: Payer: Medicare Other | Admitting: Vascular Surgery

## 2012-05-01 ENCOUNTER — Encounter: Payer: Self-pay | Admitting: Vascular Surgery

## 2012-05-04 ENCOUNTER — Ambulatory Visit (INDEPENDENT_AMBULATORY_CARE_PROVIDER_SITE_OTHER): Payer: Medicare Other | Admitting: Vascular Surgery

## 2012-05-04 ENCOUNTER — Encounter (INDEPENDENT_AMBULATORY_CARE_PROVIDER_SITE_OTHER): Payer: Medicare Other | Admitting: *Deleted

## 2012-05-04 ENCOUNTER — Encounter: Payer: Self-pay | Admitting: Vascular Surgery

## 2012-05-04 VITALS — BP 119/77 | HR 98 | Resp 20 | Ht 67.0 in | Wt 273.0 lb

## 2012-05-04 DIAGNOSIS — R609 Edema, unspecified: Secondary | ICD-10-CM

## 2012-05-04 DIAGNOSIS — R600 Localized edema: Secondary | ICD-10-CM | POA: Insufficient documentation

## 2012-05-04 DIAGNOSIS — I83893 Varicose veins of bilateral lower extremities with other complications: Secondary | ICD-10-CM

## 2012-05-04 DIAGNOSIS — M7989 Other specified soft tissue disorders: Secondary | ICD-10-CM

## 2012-05-04 NOTE — Progress Notes (Signed)
Subjective:     Patient ID: Colin [name], male   DOB: 1940/11/16, 71 y.o.   MRN: [id number]  HPI this 71 year old male patient was referred by Dr. Rennis Golden for possible closure of perforating branch right leg. Patient states that one year ago both legs were equal in size and he had no swelling in the right lower extremity. Sometime in the past several months he developed progressive edema in the right leg with no documented DVT or thrombophlebitis. He developed severe skin changes with scaly thickened skin over the lower half of the right leg. He had RF ablation of the right small saphenous and right great saphenous veins performed by Dr. Rennis Golden in August and September. Patient has continued to have progressive edema. He is not elevate his legs at night. He has tried wearing elastic stockings but does not wear them on a regular basis. Dr. Blanchie Dessert lab discovered a perforating branch in the lower third of the leg and he was referred for closure of that branch. Patient has no symptoms in the contralateral left leg.  Past Medical History  Diagnosis Date  . Diabetes mellitus   . Hypertension   . Prostate cancer   . 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 Hepatitis C     History  Substance Use Topics  . Smoking status: Former Smoker    Quit date: 05/04/1972  . Smokeless tobacco: Not on file  . Alcohol Use: Yes     3-4 drinks a day    History reviewed. No pertinent family history.  No Known Allergies  Current outpatient prescriptions:losartan (COZAAR) 100 MG tablet, Take 100 mg by mouth daily.  , Disp: , Rfl: ;  metFORMIN (GLUCOPHAGE) 500 MG tablet, Take 500 mg by mouth 2 (two) times daily.  , Disp: , Rfl: ;  Multiple Vitamins-Minerals (MULTIVITAMIN WITH MINERALS) tablet, Take 1 tablet by mouth daily.  , Disp: , Rfl: ;  oxyCODONE-acetaminophen (PERCOCET) 10-650 MG per tablet, Take 1 tablet by mouth every 4 (four) hours as needed.  , Disp: , Rfl:  potassium chloride (KLOR-CON 10) 10 MEQ CR tablet, Take  10 mEq by mouth daily.  , Disp: , Rfl: ;  torsemide (DEMADEX) 20 MG tablet, Take 20 mg by mouth daily.  , Disp: , Rfl:   BP 119/77  Pulse 98  Resp 20  Ht 5\' 7"  (1.702 m)  Wt 273 lb (123.832 kg)  BMI 42.76 kg/m2  SpO2 98%  Body mass index is 42.76 kg/(m^2).           Review of Systems denies chest pain, but does complain of mild dyspnea on exertion. Denies claudication. Has had multiple problems with joints including 2 knee replacements on the left and one on the right. Other systems negative and complete review of systems     Objective:   Physical Exam blood pressure 119/77 heart rate 98 respirations 20 Gen.-alert and oriented x3 in no apparent distress-obese  HEENT normal for age Lungs no rhonchi or wheezing Cardiovascular regular rhythm no murmurs carotid pulses 3+ palpable no bruits audible Abdomen soft nontender no palpable masses-obese Musculoskeletal free of  major deformities Skin clear -no rashes Neurologic normal Lower extremities 3+ femoral and dorsalis pedis pulses palpable bilaterally  Right leg with 2+ edema particularly below the knee with severe white lipo dermato sclerosis involving the lower half of the right leg. There is no active ulceration noted. Multiple small reticular veins are noted no bulging varicosities noted.  Left leg is free of bulging varicosities or distal edema.  Today I ordered a venous duplex of the right leg which I reviewed and interpreted. The right great saphenous and right small saphenous veins are absent. No perforators are identified in the lower half of the right leg. Right leg is very edematous. There is gross deep vein reflux on the right.      Assessment:     Chronic edema right lower extremity status post RF closure right great saphenous and right small saphenous vein by Dr. Rennis Golden Referred for perforator closure right leg-none identified on venous study in our office    Plan:     #1 chronic edema with severe skin changes  right lower extremity post right RF closure GSD and SSV #2 deep vein insufficiency right leg   Recommend #1 elevate foot of bed 3-4 inches every night #2 placed short leg elastic compression stocking before getting out of bed in the morning #3 treatment of his problem needs to the medical with elevation and elastic compression #4 patient return to see Dr. Rennis Golden for followup

## 2012-12-01 ENCOUNTER — Encounter (HOSPITAL_COMMUNITY): Payer: Self-pay | Admitting: Pharmacy Technician

## 2012-12-07 ENCOUNTER — Encounter (HOSPITAL_COMMUNITY): Payer: Self-pay

## 2012-12-07 ENCOUNTER — Encounter (HOSPITAL_COMMUNITY): Payer: Self-pay | Admitting: Pharmacy Technician

## 2012-12-07 ENCOUNTER — Encounter (HOSPITAL_COMMUNITY)
Admission: RE | Admit: 2012-12-07 | Discharge: 2012-12-07 | Disposition: A | Discharge status: Home / Self Care | Payer: Medicare Other | Source: Ambulatory Visit | Attending: Ophthalmology | Admitting: Ophthalmology

## 2012-12-07 HISTORY — DX: Unspecified osteoarthritis, unspecified site: M19.90

## 2012-12-07 LAB — BASIC METABOLIC PANEL
BUN: 18 mg/dL (ref 6–23)
Calcium: 9.6 mg/dL (ref 8.4–10.5)
Creatinine, Ser: 1.25 mg/dL (ref 0.50–1.35)
GFR calc non Af Amer: 56 mL/min — ABNORMAL LOW (ref >90)
Glucose, Bld: 151 mg/dL — ABNORMAL HIGH (ref 70–99)

## 2012-12-07 NOTE — Patient Instructions (Addendum)
Your procedure is scheduled on: 12/14/2012  Report to [facility] at  1230   PM.  Call this number if you have problems the morning of surgery: [phone]   Do not eat food or drink liquids :After Midnight.      Take these medicines the morning of surgery with A SIP OF WATER: cozaar, percocet   Do not wear jewelry, make-up or nail polish.  Do not wear lotions, powders, or perfumes.   Do not shave 48 hours prior to surgery.  Do not bring valuables to the hospital.  Contacts, dentures or bridgework may not be worn into surgery.  Leave suitcase in the car. After surgery it may be brought to your room.  For patients admitted to the hospital, checkout time is 11:00 AM the day of discharge.   Patients discharged the day of surgery will not be allowed to drive home.  :     Please read over the following fact sheets that you were given: Coughing and Deep Breathing, Surgical Site Infection Prevention, Anesthesia Post-op Instructions and Care and Recovery After Surgery    Cataract A cataract is a clouding of the lens of the eye. When a lens becomes cloudy, vision is reduced based on the degree and nature of the clouding. Many cataracts reduce vision to some degree. Some cataracts make people more near-sighted as they develop. Other cataracts increase glare. Cataracts that are ignored and become worse can sometimes look white. The white color can be seen through the pupil. CAUSES   Aging. However, cataracts may occur at any age, even in newborns.   Certain drugs.   Trauma to the eye.   Certain diseases such as diabetes.   Specific eye diseases such as chronic inflammation inside the eye or a sudden attack of a rare form of glaucoma.   Inherited or acquired medical problems.  SYMPTOMS   Gradual, progressive drop in vision in the affected eye.   Severe, rapid visual loss. This most often happens when trauma is the cause.  DIAGNOSIS  To detect a cataract, an eye doctor examines the lens.  Cataracts are best diagnosed with an exam of the eyes with the pupils enlarged (dilated) by drops.  TREATMENT  For an early cataract, vision may improve by using different eyeglasses or stronger lighting. If that does not help your vision, surgery is the only effective treatment. A cataract needs to be surgically removed when vision loss interferes with your everyday activities, such as driving, reading, or watching TV. A cataract may also have to be removed if it prevents examination or treatment of another eye problem. Surgery removes the cloudy lens and usually replaces it with a substitute lens (intraocular lens, IOL).  At a time when both you and your doctor agree, the cataract will be surgically removed. If you have cataracts in both eyes, only one is usually removed at a time. This allows the operated eye to heal and be out of danger from any possible problems after surgery (such as infection or poor wound healing). In rare cases, a cataract may be doing damage to your eye. In these cases, your caregiver may advise surgical removal right away. The vast majority of people who have cataract surgery have better vision afterward. HOME CARE INSTRUCTIONS  If you are not planning surgery, you may be asked to do the following:  Use different eyeglasses.   Use stronger or brighter lighting.   Ask your eye doctor about reducing your medicine dose or changing  medicines if it is thought that a medicine caused your cataract. Changing medicines does not make the cataract go away on its own.   Become familiar with your surroundings. Poor vision can lead to injury. Avoid bumping into things on the affected side. You are at a higher risk for tripping or falling.   Exercise extreme care when driving or operating machinery.   Wear sunglasses if you are sensitive to bright light or experiencing problems with glare.  SEEK IMMEDIATE MEDICAL CARE IF:   You have a worsening or sudden vision loss.   You notice  redness, swelling, or increasing pain in the eye.   You have a fever.  Document Released: 06/24/2005 Document Revised: 06/13/2011 Document Reviewed: 02/15/2011 [facility] Patient Information 2012 [place].PATIENT INSTRUCTIONS POST-ANESTHESIA  IMMEDIATELY FOLLOWING SURGERY:  Do not drive or operate machinery for the first twenty four hours after surgery.  Do not make any important decisions for twenty four hours after surgery or while taking narcotic pain medications or sedatives.  If you develop intractable nausea and vomiting or a severe headache please notify your doctor immediately.  FOLLOW-UP:  Please make an appointment with your surgeon as instructed. You do not need to follow up with anesthesia unless specifically instructed to do so.  WOUND CARE INSTRUCTIONS (if applicable):  Keep a dry clean dressing on the anesthesia/puncture wound site if there is drainage.  Once the wound has quit draining you may leave it open to air.  Generally you should leave the bandage intact for twenty four hours unless there is drainage.  If the epidural site drains for more than 36-48 hours please call the anesthesia department.  QUESTIONS?:  Please feel free to call your physician or the hospital operator if you have any questions, and they will be happy to assist you.

## 2012-12-14 ENCOUNTER — Encounter (HOSPITAL_COMMUNITY): Payer: Self-pay | Admitting: *Deleted

## 2012-12-14 ENCOUNTER — Ambulatory Visit (HOSPITAL_COMMUNITY)
Admission: RE | Admit: 2012-12-14 | Discharge: 2012-12-14 | Disposition: A | Discharge status: Home / Self Care | Payer: Medicare Other | Source: Ambulatory Visit | Attending: Ophthalmology | Admitting: Ophthalmology

## 2012-12-14 ENCOUNTER — Ambulatory Visit (HOSPITAL_COMMUNITY): Payer: Medicare Other | Admitting: Anesthesiology

## 2012-12-14 ENCOUNTER — Encounter (HOSPITAL_COMMUNITY): Payer: Self-pay | Admitting: Anesthesiology

## 2012-12-14 ENCOUNTER — Encounter (HOSPITAL_COMMUNITY)
Admission: RE | Disposition: A | Discharge status: Home / Self Care | Payer: Self-pay | Source: Ambulatory Visit | Attending: Ophthalmology

## 2012-12-14 DIAGNOSIS — Z01810 Encounter for preprocedural cardiovascular examination: Secondary | ICD-10-CM | POA: Insufficient documentation

## 2012-12-14 DIAGNOSIS — Z01812 Encounter for preprocedural laboratory examination: Secondary | ICD-10-CM | POA: Insufficient documentation

## 2012-12-14 DIAGNOSIS — E119 Type 2 diabetes mellitus without complications: Secondary | ICD-10-CM | POA: Insufficient documentation

## 2012-12-14 DIAGNOSIS — I10 Essential (primary) hypertension: Secondary | ICD-10-CM | POA: Insufficient documentation

## 2012-12-14 DIAGNOSIS — H2589 Other age-related cataract: Secondary | ICD-10-CM | POA: Insufficient documentation

## 2012-12-14 HISTORY — PX: CATARACT EXTRACTION W/PHACO: SHX586

## 2012-12-14 SURGERY — PHACOEMULSIFICATION, CATARACT, WITH IOL INSERTION
Anesthesia: Monitor Anesthesia Care | Site: Eye | Laterality: Left | Wound class: Clean

## 2012-12-14 MED ORDER — LIDOCAINE HCL (PF) 1 % IJ SOLN
INTRAMUSCULAR | Status: DC | PRN
  Administered 2012-12-14: .4 mL

## 2012-12-14 MED ORDER — EPINEPHRINE HCL 1 MG/ML IJ SOLN
INTRAOCULAR | Status: DC | PRN
  Administered 2012-12-14: 15:00:00

## 2012-12-14 MED ORDER — MIDAZOLAM HCL 5 MG/5ML IJ SOLN
INTRAMUSCULAR | Status: AC
  Filled 2012-12-14: qty 5

## 2012-12-14 MED ORDER — TETRACAINE HCL 0.5 % OP SOLN
1.0000 [drp] | OPHTHALMIC | Status: AC
  Administered 2012-12-14 (×3): 1 [drp] via OPHTHALMIC

## 2012-12-14 MED ORDER — PHENYLEPHRINE HCL 2.5 % OP SOLN
1.0000 [drp] | OPHTHALMIC | Status: AC
  Administered 2012-12-14 (×3): 1 [drp] via OPHTHALMIC

## 2012-12-14 MED ORDER — LIDOCAINE HCL 3.5 % OP GEL
OPHTHALMIC | Status: AC
  Filled 2012-12-14: qty 5

## 2012-12-14 MED ORDER — NEOMYCIN-POLYMYXIN-DEXAMETH 0.1 % OP OINT
TOPICAL_OINTMENT | OPHTHALMIC | Status: DC | PRN
  Administered 2012-12-14: 1 via OPHTHALMIC

## 2012-12-14 MED ORDER — CYCLOPENTOLATE-PHENYLEPHRINE 0.2-1 % OP SOLN
1.0000 [drp] | OPHTHALMIC | Status: AC
  Administered 2012-12-14 (×3): 1 [drp] via OPHTHALMIC

## 2012-12-14 MED ORDER — NA HYALUR & NA CHOND-NA HYALUR 0.55-0.5 ML IO KIT
PACK | INTRAOCULAR | Status: DC | PRN
  Administered 2012-12-14: 1 via OPHTHALMIC

## 2012-12-14 MED ORDER — EPINEPHRINE HCL 1 MG/ML IJ SOLN
INTRAMUSCULAR | Status: AC
  Filled 2012-12-14: qty 1

## 2012-12-14 MED ORDER — NEOMYCIN-POLYMYXIN-DEXAMETH 3.5-10000-0.1 OP OINT
TOPICAL_OINTMENT | OPHTHALMIC | Status: AC
  Filled 2012-12-14: qty 3.5

## 2012-12-14 MED ORDER — TETRACAINE HCL 0.5 % OP SOLN
OPHTHALMIC | Status: AC
  Filled 2012-12-14: qty 2

## 2012-12-14 MED ORDER — PHENYLEPHRINE HCL 2.5 % OP SOLN
OPHTHALMIC | Status: AC
  Filled 2012-12-14: qty 2

## 2012-12-14 MED ORDER — LACTATED RINGERS IV SOLN
INTRAVENOUS | Status: DC
  Administered 2012-12-14: 15:00:00 via INTRAVENOUS

## 2012-12-14 MED ORDER — MIDAZOLAM HCL 2 MG/2ML IJ SOLN
1.0000 mg | INTRAMUSCULAR | Status: DC | PRN
  Administered 2012-12-14: 2 mg via INTRAVENOUS

## 2012-12-14 MED ORDER — LIDOCAINE HCL (PF) 1 % IJ SOLN
INTRAMUSCULAR | Status: AC
  Filled 2012-12-14: qty 2

## 2012-12-14 MED ORDER — BSS IO SOLN
INTRAOCULAR | Status: DC | PRN
  Administered 2012-12-14: 15 mL via INTRAOCULAR

## 2012-12-14 MED ORDER — LIDOCAINE 3.5 % OP GEL OPTIME - NO CHARGE
OPHTHALMIC | Status: DC | PRN
  Administered 2012-12-14: 1 [drp] via OPHTHALMIC

## 2012-12-14 MED ORDER — LIDOCAINE HCL 3.5 % OP GEL
1.0000 "application " | Freq: Once | OPHTHALMIC | Status: AC
  Administered 2012-12-14: 1 via OPHTHALMIC

## 2012-12-14 MED ORDER — POVIDONE-IODINE 5 % OP SOLN
OPHTHALMIC | Status: DC | PRN
  Administered 2012-12-14: 1 via OPHTHALMIC

## 2012-12-14 MED ORDER — CYCLOPENTOLATE-PHENYLEPHRINE 0.2-1 % OP SOLN
OPHTHALMIC | Status: AC
  Filled 2012-12-14: qty 2

## 2012-12-14 SURGICAL SUPPLY — 32 items
CAPSULAR TENSION RING-AMO (OPHTHALMIC RELATED) IMPLANT
CLOTH BEACON ORANGE TIMEOUT ST (SAFETY) ×1 IMPLANT
EYE SHIELD UNIVERSAL CLEAR (GAUZE/BANDAGES/DRESSINGS) ×1 IMPLANT
GLOVE BIO SURGEON STRL SZ 6.5 (GLOVE) IMPLANT
GLOVE BIOGEL PI IND STRL 6.5 (GLOVE) IMPLANT
GLOVE BIOGEL PI IND STRL 7.0 (GLOVE) IMPLANT
GLOVE BIOGEL PI IND STRL 7.5 (GLOVE) IMPLANT
GLOVE BIOGEL PI INDICATOR 6.5 (GLOVE)
GLOVE BIOGEL PI INDICATOR 7.0 (GLOVE) ×2
GLOVE BIOGEL PI INDICATOR 7.5 (GLOVE)
GLOVE ECLIPSE 6.5 STRL STRAW (GLOVE) IMPLANT
GLOVE ECLIPSE 7.0 STRL STRAW (GLOVE) IMPLANT
GLOVE ECLIPSE 7.5 STRL STRAW (GLOVE) IMPLANT
GLOVE EXAM NITRILE LRG STRL (GLOVE) IMPLANT
GLOVE EXAM NITRILE MD LF STRL (GLOVE) IMPLANT
GLOVE SKINSENSE NS SZ6.5 (GLOVE)
GLOVE SKINSENSE NS SZ7.0 (GLOVE)
GLOVE SKINSENSE STRL SZ6.5 (GLOVE) IMPLANT
GLOVE SKINSENSE STRL SZ7.0 (GLOVE) IMPLANT
KIT VITRECTOMY (OPHTHALMIC RELATED) IMPLANT
PAD ARMBOARD 7.5X6 YLW CONV (MISCELLANEOUS) ×1 IMPLANT
PROC W NO LENS (INTRAOCULAR LENS)
PROC W SPEC LENS (INTRAOCULAR LENS)
PROCESS W NO LENS (INTRAOCULAR LENS) IMPLANT
PROCESS W SPEC LENS (INTRAOCULAR LENS) IMPLANT
RING MALYGIN (MISCELLANEOUS) IMPLANT
SIGHTPATH CAT PROC W REG LENS (Ophthalmic Related) ×2 IMPLANT
SYR TB 1ML LL NO SAFETY (SYRINGE) ×1 IMPLANT
TAPE SURG TRANSPORE 1 IN (GAUZE/BANDAGES/DRESSINGS) IMPLANT
TAPE SURGICAL TRANSPORE 1 IN (GAUZE/BANDAGES/DRESSINGS) ×1
VISCOELASTIC ADDITIONAL (OPHTHALMIC RELATED) IMPLANT
WATER STERILE IRR 250ML POUR (IV SOLUTION) ×1 IMPLANT

## 2012-12-14 NOTE — Anesthesia Preprocedure Evaluation (Signed)
Anesthesia Evaluation  Patient identified by MRN, date of birth, ID band Patient awake    Reviewed: Allergy & Precautions, H&P , NPO status , Patient's Chart, lab work & pertinent test results  Airway Mallampati: II TM Distance: >3 FB     Dental  (+) Edentulous Upper and Edentulous Lower   Pulmonary  breath sounds clear to auscultation        Cardiovascular hypertension, + Peripheral Vascular Disease Rhythm:Regular Rate:Normal     Neuro/Psych    GI/Hepatic (+)     substance abuse (remission)  IV drug use, Hepatitis -, C  Endo/Other  diabetes, Type 2  Renal/GU      Musculoskeletal   Abdominal   Peds  Hematology   Anesthesia Other Findings   Reproductive/Obstetrics                           Anesthesia Physical Anesthesia Plan  ASA: III  Anesthesia Plan: MAC   Post-op Pain Management:    Induction: Intravenous  Airway Management Planned: Nasal Cannula  Additional Equipment:   Intra-op Plan:   Post-operative Plan:   Informed Consent: I have reviewed the patients History and Physical, chart, labs and discussed the procedure including the risks, benefits and alternatives for the proposed anesthesia with the patient or authorized representative who has indicated his/her understanding and acceptance.     Plan Discussed with:   Anesthesia Plan Comments:         Anesthesia Quick Evaluation

## 2012-12-14 NOTE — Transfer of Care (Signed)
Immediate Anesthesia Transfer of Care Note  Patient: Colin [name]  Procedure(s) Performed: Procedure(s) with comments: CATARACT EXTRACTION PHACO AND INTRAOCULAR LENS PLACEMENT (IOC) (Left) - CDE: 13.74  Patient Location: Short Stay  Anesthesia Type:MAC  Level of Consciousness: awake, alert , oriented and patient cooperative  Airway & Oxygen Therapy: Patient Spontanous Breathing  Post-op Assessment: Report given to PACU RN and Post -op Vital signs reviewed and stable  Post vital signs: Reviewed and stable  Complications: No apparent anesthesia complications

## 2012-12-14 NOTE — Anesthesia Postprocedure Evaluation (Signed)
  Anesthesia Post-op Note  Patient: Colin [name]  Procedure(s) Performed: Procedure(s) with comments: CATARACT EXTRACTION PHACO AND INTRAOCULAR LENS PLACEMENT (IOC) (Left) - CDE: 13.74  Patient Location: Short Stay  Anesthesia Type:MAC  Level of Consciousness: awake, alert , oriented and patient cooperative  Airway and Oxygen Therapy: Patient Spontanous Breathing  Post-op Pain: none  Post-op Assessment: Post-op Vital signs reviewed, Patient's Cardiovascular Status Stable, Respiratory Function Stable, Patent Airway and Pain level controlled  Post-op Vital Signs: Reviewed and stable  Complications: No apparent anesthesia complications

## 2012-12-14 NOTE — Op Note (Signed)
Date of Admission: 12/14/2012  Date of Surgery: 12/14/2012  Pre-Op Dx: Cataract  Left  Eye  Post-Op Dx: Cataract  Left  Eye,  Dx Code 366.19  Surgeon: Gemma Payor, M.D.  Assistants: None  Anesthesia: Topical with MAC  Indications: Painless, progressive loss of vision with compromise of daily activities.  Surgery: Cataract Extraction with Intraocular lens Implant Left Eye  Discription: The patient had dilating drops and viscous lidocaine placed into the left eye in the pre-op holding area. After transfer to the operating room, a time out was performed. The patient was then prepped and draped. Beginning with a 75 degree blade a paracentesis port was made at the surgeon's 2 o'clock position. The anterior chamber was then filled with 1% non-preserved lidocaine. This was followed by filling the anterior chamber with Provisc. A bent cystatome needle was used to create a continuous tear capsulotomy. Hydrodissection was performed with balanced salt solution on a Fine canula. The lens nucleus was then removed using the phacoemulsification handpiece. Residual cortex was removed with the I&A handpiece. The anterior chamber and capsular bag were refilled with Provisc. A posterior chamber intraocular lens was placed into the capsular bag with it's injector. The implant was positioned with the Kuglan hook. The Provisc was then removed from the anterior chamber and capsular bag with the I&A handpiece. Stromal hydration of the main incision and paracentesis port was performed with BSS on a Fine canula. The wounds were tested for leak which was negative. The patient tolerated the procedure well. There were no operative complications. The patient was then transferred to the recovery room in stable condition.  Complications: None  Specimen: None  EBL: None  Prosthetic device: B&L enVista, MX60, power 19.0, SN [id number].

## 2012-12-14 NOTE — H&P (Signed)
I have reviewed the H&P, the patient was re-examined, and I have identified no interval changes in medical condition and plan of care since the history and physical of record  

## 2012-12-15 ENCOUNTER — Encounter (HOSPITAL_COMMUNITY): Payer: Self-pay | Admitting: Ophthalmology

## 2012-12-21 ENCOUNTER — Encounter (HOSPITAL_COMMUNITY): Payer: Medicare Other

## 2012-12-21 ENCOUNTER — Encounter (HOSPITAL_COMMUNITY): Payer: Self-pay | Admitting: *Deleted

## 2012-12-21 NOTE — Progress Notes (Signed)
12/21/12 1300  OBSTRUCTIVE SLEEP APNEA  Have you ever been diagnosed with sleep apnea through a sleep study? No  Do you snore loudly (loud enough to be heard through closed doors)?  0  Do you often feel tired, fatigued, or sleepy during the daytime? 0  Has anyone observed you stop breathing during your sleep? 0  Do you have, or are you being treated for high blood pressure? 1  BMI more than 35 kg/m2? 1  Age over 71 years old? 1  Neck circumference greater than 40 cm/18 inches? 1  Gender: 1  Obstructive Sleep Apnea Score 5  Score 4 or greater  Results sent to PCP

## 2012-12-24 MED ORDER — FENTANYL CITRATE 0.05 MG/ML IJ SOLN: 25.0000 ug | INTRAMUSCULAR | Status: DC | PRN

## 2012-12-24 MED ORDER — ONDANSETRON HCL 4 MG/2ML IJ SOLN: 4.0000 mg | Freq: Once | INTRAMUSCULAR | Status: AC | PRN

## 2012-12-25 MED ORDER — CYCLOPENTOLATE-PHENYLEPHRINE 0.2-1 % OP SOLN
OPHTHALMIC | Status: AC
  Filled 2012-12-25: qty 2

## 2012-12-25 MED ORDER — TETRACAINE HCL 0.5 % OP SOLN
OPHTHALMIC | Status: AC
  Filled 2012-12-25: qty 2

## 2012-12-25 MED ORDER — NEOMYCIN-POLYMYXIN-DEXAMETH 3.5-10000-0.1 OP OINT
TOPICAL_OINTMENT | OPHTHALMIC | Status: AC
  Filled 2012-12-25: qty 3.5

## 2012-12-25 MED ORDER — LIDOCAINE HCL 3.5 % OP GEL
OPHTHALMIC | Status: AC
  Filled 2012-12-25: qty 5

## 2012-12-25 MED ORDER — LIDOCAINE HCL (PF) 1 % IJ SOLN
INTRAMUSCULAR | Status: AC
  Filled 2012-12-25: qty 2

## 2012-12-28 ENCOUNTER — Encounter (HOSPITAL_COMMUNITY): Payer: Self-pay | Admitting: *Deleted

## 2012-12-28 ENCOUNTER — Ambulatory Visit (HOSPITAL_COMMUNITY)
Admission: RE | Admit: 2012-12-28 | Discharge: 2012-12-28 | Disposition: A | Discharge status: Home / Self Care | Payer: Medicare Other | Source: Ambulatory Visit | Attending: Ophthalmology | Admitting: Ophthalmology

## 2012-12-28 ENCOUNTER — Ambulatory Visit (HOSPITAL_COMMUNITY): Payer: Medicare Other | Admitting: Anesthesiology

## 2012-12-28 ENCOUNTER — Encounter (HOSPITAL_COMMUNITY)
Admission: RE | Disposition: A | Discharge status: Home / Self Care | Payer: Self-pay | Source: Ambulatory Visit | Attending: Ophthalmology

## 2012-12-28 ENCOUNTER — Encounter (HOSPITAL_COMMUNITY): Payer: Self-pay | Admitting: Anesthesiology

## 2012-12-28 DIAGNOSIS — I10 Essential (primary) hypertension: Secondary | ICD-10-CM | POA: Insufficient documentation

## 2012-12-28 DIAGNOSIS — E119 Type 2 diabetes mellitus without complications: Secondary | ICD-10-CM | POA: Insufficient documentation

## 2012-12-28 DIAGNOSIS — Z01812 Encounter for preprocedural laboratory examination: Secondary | ICD-10-CM | POA: Insufficient documentation

## 2012-12-28 DIAGNOSIS — H2589 Other age-related cataract: Secondary | ICD-10-CM | POA: Insufficient documentation

## 2012-12-28 HISTORY — PX: CATARACT EXTRACTION W/PHACO: SHX586

## 2012-12-28 LAB — GLUCOSE, CAPILLARY: Glucose-Capillary: 139 mg/dL — ABNORMAL HIGH (ref 70–99)

## 2012-12-28 SURGERY — PHACOEMULSIFICATION, CATARACT, WITH IOL INSERTION
Anesthesia: Monitor Anesthesia Care | Site: Eye | Laterality: Right | Wound class: Clean

## 2012-12-28 MED ORDER — PHENYLEPHRINE HCL 2.5 % OP SOLN
1.0000 [drp] | OPHTHALMIC | Status: AC
  Administered 2012-12-28 (×3): 1 [drp] via OPHTHALMIC

## 2012-12-28 MED ORDER — MIDAZOLAM HCL 2 MG/2ML IJ SOLN
INTRAMUSCULAR | Status: AC
  Filled 2012-12-28: qty 2

## 2012-12-28 MED ORDER — BSS IO SOLN
INTRAOCULAR | Status: DC | PRN
  Administered 2012-12-28: 15 mL via INTRAOCULAR

## 2012-12-28 MED ORDER — MIDAZOLAM HCL 2 MG/2ML IJ SOLN
1.0000 mg | INTRAMUSCULAR | Status: DC | PRN
  Administered 2012-12-28 (×2): 2 mg via INTRAVENOUS

## 2012-12-28 MED ORDER — PHENYLEPHRINE HCL 2.5 % OP SOLN
OPHTHALMIC | Status: AC
  Filled 2012-12-28: qty 15

## 2012-12-28 MED ORDER — NEOMYCIN-POLYMYXIN-DEXAMETH 0.1 % OP OINT
TOPICAL_OINTMENT | OPHTHALMIC | Status: DC | PRN
  Administered 2012-12-28: 1 via OPHTHALMIC

## 2012-12-28 MED ORDER — LIDOCAINE HCL (PF) 1 % IJ SOLN
INTRAMUSCULAR | Status: DC | PRN
  Administered 2012-12-28: .4 mL

## 2012-12-28 MED ORDER — LIDOCAINE HCL 3.5 % OP GEL
1.0000 "application " | Freq: Once | OPHTHALMIC | Status: AC
  Administered 2012-12-28: 1 via OPHTHALMIC

## 2012-12-28 MED ORDER — PROVISC 10 MG/ML IO SOLN
INTRAOCULAR | Status: DC | PRN
  Administered 2012-12-28: 8.5 mg via INTRAOCULAR

## 2012-12-28 MED ORDER — LACTATED RINGERS IV SOLN
INTRAVENOUS | Status: DC
  Administered 2012-12-28: 08:00:00 via INTRAVENOUS

## 2012-12-28 MED ORDER — EPINEPHRINE HCL 1 MG/ML IJ SOLN
INTRAMUSCULAR | Status: AC
  Filled 2012-12-28: qty 1

## 2012-12-28 MED ORDER — POVIDONE-IODINE 5 % OP SOLN
OPHTHALMIC | Status: DC | PRN
  Administered 2012-12-28: 1 via OPHTHALMIC

## 2012-12-28 MED ORDER — TETRACAINE HCL 0.5 % OP SOLN
1.0000 [drp] | OPHTHALMIC | Status: AC
  Administered 2012-12-28 (×3): 1 [drp] via OPHTHALMIC

## 2012-12-28 MED ORDER — CYCLOPENTOLATE-PHENYLEPHRINE 0.2-1 % OP SOLN
1.0000 [drp] | OPHTHALMIC | Status: AC
  Administered 2012-12-28 (×3): 1 [drp] via OPHTHALMIC

## 2012-12-28 MED ORDER — EPINEPHRINE HCL 1 MG/ML IJ SOLN
INTRAOCULAR | Status: DC | PRN
  Administered 2012-12-28: 08:00:00

## 2012-12-28 SURGICAL SUPPLY — 31 items
CAPSULAR TENSION RING-AMO (OPHTHALMIC RELATED) IMPLANT
CLOTH BEACON ORANGE TIMEOUT ST (SAFETY) ×1 IMPLANT
EYE SHIELD UNIVERSAL CLEAR (GAUZE/BANDAGES/DRESSINGS) ×1 IMPLANT
GLOVE BIO SURGEON STRL SZ 6.5 (GLOVE) IMPLANT
GLOVE BIOGEL PI IND STRL 6.5 (GLOVE) IMPLANT
GLOVE BIOGEL PI IND STRL 7.0 (GLOVE) IMPLANT
GLOVE BIOGEL PI IND STRL 7.5 (GLOVE) IMPLANT
GLOVE BIOGEL PI INDICATOR 6.5 (GLOVE) ×1
GLOVE BIOGEL PI INDICATOR 7.0 (GLOVE) ×1
GLOVE BIOGEL PI INDICATOR 7.5 (GLOVE)
GLOVE ECLIPSE 6.5 STRL STRAW (GLOVE) IMPLANT
GLOVE ECLIPSE 7.0 STRL STRAW (GLOVE) IMPLANT
GLOVE ECLIPSE 7.5 STRL STRAW (GLOVE) IMPLANT
GLOVE EXAM NITRILE LRG STRL (GLOVE) IMPLANT
GLOVE EXAM NITRILE MD LF STRL (GLOVE) IMPLANT
GLOVE SKINSENSE NS SZ6.5 (GLOVE)
GLOVE SKINSENSE NS SZ7.0 (GLOVE)
GLOVE SKINSENSE STRL SZ6.5 (GLOVE) IMPLANT
GLOVE SKINSENSE STRL SZ7.0 (GLOVE) IMPLANT
KIT VITRECTOMY (OPHTHALMIC RELATED) IMPLANT
PAD ARMBOARD 7.5X6 YLW CONV (MISCELLANEOUS) ×1 IMPLANT
PROC W NO LENS (INTRAOCULAR LENS)
PROC W SPEC LENS (INTRAOCULAR LENS)
PROCESS W NO LENS (INTRAOCULAR LENS) IMPLANT
PROCESS W SPEC LENS (INTRAOCULAR LENS) IMPLANT
RING MALYGIN (MISCELLANEOUS) IMPLANT
SIGHTPATH CAT PROC W REG LENS (Ophthalmic Related) ×2 IMPLANT
SYR TB 1ML LL NO SAFETY (SYRINGE) ×1 IMPLANT
TAPE CLOTH SOFT 2X10 (GAUZE/BANDAGES/DRESSINGS) ×1 IMPLANT
VISCOELASTIC ADDITIONAL (OPHTHALMIC RELATED) IMPLANT
WATER STERILE IRR 250ML POUR (IV SOLUTION) ×1 IMPLANT

## 2012-12-28 NOTE — Transfer of Care (Signed)
Immediate Anesthesia Transfer of Care Note  Patient: Colin [name]  Procedure(s) Performed: Procedure(s) with comments: CATARACT EXTRACTION PHACO AND INTRAOCULAR LENS PLACEMENT (IOC) (Right) - CDE:12.80  Patient Location: PACU and Short Stay  Anesthesia Type:MAC  Level of Consciousness: awake  Airway & Oxygen Therapy: Patient Spontanous Breathing  Post-op Assessment: Report given to PACU RN  Post vital signs: Reviewed  Complications: No apparent anesthesia complications

## 2012-12-28 NOTE — H&P (Signed)
I have reviewed the H&P, the patient was re-examined, and I have identified no interval changes in medical condition and plan of care since the history and physical of record  

## 2012-12-28 NOTE — Op Note (Signed)
Date of Admission: 12/28/2012  Date of Surgery: 12/28/2012  Pre-Op Dx: Cataract  Right  Eye  Post-Op Dx: Cataract  Right  Eye,  Dx Code 366.19  Surgeon: Gemma Payor, M.D.  Assistants: None  Anesthesia: Topical with MAC  Indications: Painless, progressive loss of vision with compromise of daily activities.  Surgery: Cataract Extraction with Intraocular lens Implant Right Eye  Discription: The patient had dilating drops and viscous lidocaine placed into the left eye in the pre-op holding area. After transfer to the operating room, a time out was performed. The patient was then prepped and draped. Beginning with a 75 degree blade a paracentesis port was made at the surgeon's 2 o'clock position. The anterior chamber was then filled with 1% non-preserved lidocaine. This was followed by filling the anterior chamber with Provisc. A bent cystatome needle was used to create a continuous tear capsulotomy. Hydrodissection was performed with balanced salt solution on a Fine canula. The lens nucleus was then removed using the phacoemulsification handpiece. Residual cortex was removed with the I&A handpiece. The anterior chamber and capsular bag were refilled with Provisc. A posterior chamber intraocular lens was placed into the capsular bag with it's injector. The implant was positioned with the Kuglan hook. The Provisc was then removed from the anterior chamber and capsular bag with the I&A handpiece. Stromal hydration of the main incision and paracentesis port was performed with BSS on a Fine canula. The wounds were tested for leak which was negative. The patient tolerated the procedure well. There were no operative complications. The patient was then transferred to the recovery room in stable condition.  Complications: None  Specimen: None  EBL: None  Prosthetic device: B&L enVista, MX60, power 19.0D, SN [id number].

## 2012-12-28 NOTE — Anesthesia Preprocedure Evaluation (Signed)
Anesthesia Evaluation  Patient identified by MRN, date of birth, ID band Patient awake    Reviewed: Allergy & Precautions, H&P , NPO status , Patient's Chart, lab work & pertinent test results  Airway Mallampati: II TM Distance: >3 FB     Dental  (+) Edentulous Upper and Edentulous Lower   Pulmonary  breath sounds clear to auscultation        Cardiovascular hypertension, + Peripheral Vascular Disease Rhythm:Regular Rate:Normal     Neuro/Psych    GI/Hepatic (+)     substance abuse (remission)  IV drug use, Hepatitis -, C  Endo/Other  diabetes, Type 2  Renal/GU      Musculoskeletal   Abdominal   Peds  Hematology   Anesthesia Other Findings   Reproductive/Obstetrics                           Anesthesia Physical Anesthesia Plan  ASA: III  Anesthesia Plan: MAC   Post-op Pain Management:    Induction: Intravenous  Airway Management Planned: Nasal Cannula  Additional Equipment:   Intra-op Plan:   Post-operative Plan:   Informed Consent: I have reviewed the patients History and Physical, chart, labs and discussed the procedure including the risks, benefits and alternatives for the proposed anesthesia with the patient or authorized representative who has indicated his/her understanding and acceptance.     Plan Discussed with:   Anesthesia Plan Comments:         Anesthesia Quick Evaluation  

## 2012-12-28 NOTE — Anesthesia Postprocedure Evaluation (Signed)
  Anesthesia Post-op Note  Patient: Colin [name]  Procedure(s) Performed: Procedure(s) with comments: CATARACT EXTRACTION PHACO AND INTRAOCULAR LENS PLACEMENT (IOC) (Right) - CDE:12.80  Patient Location: PACU and Short Stay  Anesthesia Type:MAC  Level of Consciousness: awake, alert  and oriented  Airway and Oxygen Therapy: Patient Spontanous Breathing  Post-op Pain: none  Post-op Assessment: Post-op Vital signs reviewed, Patient's Cardiovascular Status Stable, Respiratory Function Stable, Patent Airway and No signs of Nausea or vomiting  Post-op Vital Signs: Reviewed  Complications: No apparent anesthesia complications

## 2012-12-29 ENCOUNTER — Encounter (HOSPITAL_COMMUNITY): Payer: Self-pay | Admitting: Ophthalmology

## 2013-01-25 ENCOUNTER — Other Ambulatory Visit (HOSPITAL_COMMUNITY): Payer: Self-pay | Admitting: Family Medicine

## 2013-01-25 DIAGNOSIS — R1013 Epigastric pain: Secondary | ICD-10-CM

## 2013-01-27 ENCOUNTER — Ambulatory Visit (HOSPITAL_COMMUNITY)
Admission: RE | Admit: 2013-01-27 | Discharge: 2013-01-27 | Disposition: A | Discharge status: Home / Self Care | Payer: Medicare Other | Source: Ambulatory Visit | Attending: Family Medicine | Admitting: Family Medicine

## 2013-01-27 DIAGNOSIS — K7689 Other specified diseases of liver: Secondary | ICD-10-CM | POA: Insufficient documentation

## 2013-01-27 DIAGNOSIS — R1013 Epigastric pain: Secondary | ICD-10-CM

## 2013-01-27 DIAGNOSIS — Z8546 Personal history of malignant neoplasm of prostate: Secondary | ICD-10-CM | POA: Insufficient documentation

## 2013-01-27 DIAGNOSIS — K8020 Calculus of gallbladder without cholecystitis without obstruction: Secondary | ICD-10-CM | POA: Insufficient documentation

## 2013-01-27 DIAGNOSIS — R112 Nausea with vomiting, unspecified: Secondary | ICD-10-CM | POA: Insufficient documentation

## 2013-01-27 LAB — POCT I-STAT, CHEM 8
BUN: 21 mg/dL (ref 6–23)
Creatinine, Ser: 1.5 mg/dL — ABNORMAL HIGH (ref 0.50–1.35)
Potassium: 3.7 mEq/L (ref 3.5–5.1)
Sodium: 135 mEq/L (ref 135–145)

## 2013-01-27 MED ORDER — IOHEXOL 300 MG/ML  SOLN
100.0000 mL | Freq: Once | INTRAMUSCULAR | Status: AC | PRN
  Administered 2013-01-27: 100 mL via INTRAVENOUS

## 2013-02-24 ENCOUNTER — Encounter: Payer: Self-pay | Admitting: Internal Medicine

## 2013-02-24 ENCOUNTER — Encounter: Payer: Self-pay | Admitting: *Deleted

## 2013-02-25 ENCOUNTER — Ambulatory Visit (INDEPENDENT_AMBULATORY_CARE_PROVIDER_SITE_OTHER): Payer: Medicare Other | Admitting: Internal Medicine

## 2013-02-25 ENCOUNTER — Encounter: Payer: Self-pay | Admitting: Internal Medicine

## 2013-02-25 VITALS — BP 100/60 | HR 82 | Ht 67.0 in | Wt 218.8 lb

## 2013-02-25 DIAGNOSIS — I10 Essential (primary) hypertension: Secondary | ICD-10-CM

## 2013-02-25 DIAGNOSIS — I890 Lymphedema, not elsewhere classified: Secondary | ICD-10-CM

## 2013-02-25 DIAGNOSIS — I83893 Varicose veins of bilateral lower extremities with other complications: Secondary | ICD-10-CM

## 2013-02-25 NOTE — Progress Notes (Signed)
OFFICE NOTE  Chief Complaint:  Routine office visit  Primary Care Physician: Cassell Smiles., MD  HPI:  Colin [name] s a 72 year old gentleman with continued right lower extremity swelling. He has significant reflux in the right short saphenous vein but very little in the right greater saphenous vein. He underwent successful closure of the right short saphenous vein and then developed significant reflux of the right greater saphenous vein. We then went ahead and closed the right greater saphenous vein which initially provided improvement in his swelling; however, some of the swelling did return. It is dependent and goes away almost completely at night. On review of his Dopplers, it appears that there is only 1 small area of a septal perforator in the right mid calf which could be responsible for his symptoms. We talked about various management options; however, I felt that ablation of this final septal perforator would eliminate all of the possible causes of his swelling due to venous reflux, and at that point, if there continues to be swelling it is more likely related to small vessel lymphatics. Treatment for swelling would be very difficult and could include a sequential compression device or other therapies. I did refer him to see Dr. Hart Rochester had been in vascular surgery. They repeated the ultrasound which indicated successful closure of the greater and short saphenous veins. The perforator vessel could not be demonstrated. There was however a small amount of deep vein reflux noted. It was thought that he most likely has some element of lymphedema and that no further treatment options were available.  PMHx:  Past Medical History  Diagnosis Date  . Diabetes mellitus   . Hypertension   . Prostate cancer   . 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 Hepatitis C   . Arthritis   . Lower extremity edema   . Chronic venous insufficiency     Past Surgical History  Procedure Laterality Date  .  Joint replacement      Bilateral knee replacement  . Endovenous ablation saphenous vein w/ laser  2013    Right leg X 2 by Dr. Rennis Golden  . Left knee replacement      x2  . Right knee replacement    . Tonsillectomy    . Cataract extraction w/phaco Left 12/14/2012    Procedure: CATARACT EXTRACTION PHACO AND INTRAOCULAR LENS PLACEMENT (IOC);  Surgeon: Gemma Payor, MD;  Location: [facility];  Service: Ophthalmology;  Laterality: Left;  CDE: 13.74  . Cataract extraction w/phaco Right 12/28/2012    Procedure: CATARACT EXTRACTION PHACO AND INTRAOCULAR LENS PLACEMENT (IOC);  Surgeon: Gemma Payor, MD;  Location: [facility];  Service: Ophthalmology;  Laterality: Right;  CDE:12.80    FAMHx:  History reviewed. No pertinent family history.  SOCHx:   reports that he quit smoking about 40 years ago. He does not have any smokeless tobacco history on file. He reports that  drinks alcohol. He reports that he does not use illicit drugs.  ALLERGIES:  No Known Allergies  ROS: A comprehensive review of systems was negative except for: Cardiovascular: positive for lower extremity edema  HOME MEDS: Current Outpatient Prescriptions  Medication Sig Dispense Refill  . HYDROcodone-acetaminophen (LORCET HD) 10-325 MG per tablet Take 1 tablet by mouth every 6 (six) hours as needed for pain.      Marland Kitchen losartan (COZAAR) 100 MG tablet Take 100 mg by mouth daily.        . metFORMIN (GLUCOPHAGE) 500 MG tablet  Take 500 mg by mouth 3 (three) times daily.       . Multiple Vitamins-Minerals (MULTIVITAMIN WITH MINERALS) tablet Take 1 tablet by mouth daily.        Marland Kitchen omeprazole (PRILOSEC) 40 MG capsule Take 40 mg by mouth daily.      Marland Kitchen torsemide (DEMADEX) 20 MG tablet Take 120 mg by mouth daily.        No current facility-administered medications for this visit.    LABS/IMAGING: No results found for this or any previous visit (from the past 48 hour(s)). No results found.  VITALS: BP 100/60  Pulse 82  Ht 5\' 7"  (1.702 m)  Wt 218 lb  12.8 oz (99.247 kg)  BMI 34.26 kg/m2  EXAM: General appearance: alert and no distress Neck: no adenopathy, no carotid bruit, no JVD, supple, symmetrical, trachea midline and thyroid not enlarged, symmetric, no tenderness/mass/nodules Lungs: clear to auscultation bilaterally Heart: regular rate and rhythm, S1, S2 normal, no murmur, click, rub or gallop Abdomen: soft, non-tender; bowel sounds normal; no masses,  no organomegaly and morbidly obese Extremities: edema 3+ RLE, 2+ LLE, varicose veins noted and venous stasis dermatitis noted Pulses: 2+ and symmetric Skin: Skin color, texture, turgor normal. No rashes or lesions Neurologic: Grossly normal  EKG: NSR at 82  ASSESSMENT: 1. Symptomatic varicose veins status post ablation of the right greater saphenous vein and right short saphenous vein 2. Chronic lymphedema 3. Hypertension-controlled  PLAN: 1.   Colin [name] has had several venous ablations successful closure of the varicose veins, but has persistent swelling mostly in the right lower extremity. He was seen by vascular surgery and felt to have no options for further treatment. This is likely chronic lymphedema which can only be managed with diuretics and compression and elevation. Some patients do get benefits from a sequential compression device. At this point with very little left to offer, I would not recommend any further followup with me and his blood pressure can continue to be managed in your office.   Thank you again for allowing me to participate in his care.  Chrystie Nose, MD, [facility] Attending Cardiologist [facility]  HILTY,Kenneth C 02/25/2013, 11:58 AM

## 2013-02-25 NOTE — Patient Instructions (Signed)
Follow up as needed

## 2013-07-07 ENCOUNTER — Telehealth: Payer: Self-pay

## 2013-07-07 NOTE — Telephone Encounter (Signed)
Pt was referred by Dr. Sherwood Gambler for screening colonoscopy. I called him and he is not having any problems and no family hx of colon cancer. He thinks he had one 6-7 years ago at [facility] ( I will request any record).  He said he will call me when he is ready to schedule.

## 2013-08-19 NOTE — Telephone Encounter (Signed)
Called [facility] and requested again from [place].

## 2013-09-28 NOTE — Telephone Encounter (Signed)
Letter was sent to pt and PCP on 09/20/2013.

## 2013-12-08 ENCOUNTER — Encounter: Payer: Self-pay | Admitting: *Deleted

## 2014-01-03 ENCOUNTER — Other Ambulatory Visit (HOSPITAL_COMMUNITY): Payer: Self-pay | Admitting: Nephrology

## 2014-01-03 DIAGNOSIS — N179 Acute kidney failure, unspecified: Secondary | ICD-10-CM

## 2014-01-03 DIAGNOSIS — N184 Chronic kidney disease, stage 4 (severe): Secondary | ICD-10-CM

## 2014-01-05 ENCOUNTER — Ambulatory Visit (HOSPITAL_COMMUNITY)
Admission: RE | Admit: 2014-01-05 | Discharge: 2014-01-05 | Disposition: A | Discharge status: Home / Self Care | Payer: Medicare Other | Source: Ambulatory Visit | Attending: Nephrology | Admitting: Nephrology

## 2014-01-05 DIAGNOSIS — N179 Acute kidney failure, unspecified: Secondary | ICD-10-CM

## 2014-01-05 DIAGNOSIS — N281 Cyst of kidney, acquired: Secondary | ICD-10-CM | POA: Insufficient documentation

## 2014-01-05 DIAGNOSIS — N184 Chronic kidney disease, stage 4 (severe): Secondary | ICD-10-CM

## 2014-01-14 ENCOUNTER — Encounter: Payer: Self-pay | Admitting: Gastroenterology

## 2014-01-14 ENCOUNTER — Ambulatory Visit (INDEPENDENT_AMBULATORY_CARE_PROVIDER_SITE_OTHER): Payer: Medicare Other | Admitting: Gastroenterology

## 2014-01-14 VITALS — BP 113/69 | HR 73 | Temp 98.6°F | Ht 67.0 in | Wt 190.2 lb

## 2014-01-14 DIAGNOSIS — B182 Chronic viral hepatitis C: Secondary | ICD-10-CM

## 2014-01-14 DIAGNOSIS — Z1211 Encounter for screening for malignant neoplasm of colon: Secondary | ICD-10-CM | POA: Insufficient documentation

## 2014-01-14 LAB — CBC WITH DIFFERENTIAL/PLATELET
BASOS PCT: 0 % (ref 0–1)
Basophils Absolute: 0 10*3/uL (ref 0.0–0.1)
Eosinophils Absolute: 0.3 10*3/uL (ref 0.0–0.7)
Eosinophils Relative: 5 % (ref 0–5)
HEMATOCRIT: 28.8 % — AB (ref 39.0–52.0)
HEMOGLOBIN: 9.7 g/dL — AB (ref 13.0–17.0)
LYMPHS ABS: 1.8 10*3/uL (ref 0.7–4.0)
LYMPHS PCT: 27 % (ref 12–46)
MCH: 30.6 pg (ref 26.0–34.0)
MCHC: 33.7 g/dL (ref 30.0–36.0)
MCV: 90.9 fL (ref 78.0–100.0)
MONO ABS: 0.7 10*3/uL (ref 0.1–1.0)
MONOS PCT: 10 % (ref 3–12)
NEUTROS ABS: 3.9 10*3/uL (ref 1.7–7.7)
Neutrophils Relative %: 58 % (ref 43–77)
Platelets: 326 10*3/uL (ref 150–400)
RBC: 3.17 MIL/uL — AB (ref 4.22–5.81)
RDW: 14.9 % (ref 11.5–15.5)
WBC: 6.7 10*3/uL (ref 4.0–10.5)

## 2014-01-14 LAB — COMPREHENSIVE METABOLIC PANEL
ALT: 18 U/L (ref 0–53)
AST: 24 U/L (ref 0–37)
Albumin: 3.8 g/dL (ref 3.5–5.2)
Alkaline Phosphatase: 112 U/L (ref 39–117)
BILIRUBIN TOTAL: 0.4 mg/dL (ref 0.2–1.2)
BUN: 15 mg/dL (ref 6–23)
CO2: 24 mEq/L (ref 19–32)
Calcium: 9 mg/dL (ref 8.4–10.5)
Chloride: 103 mEq/L (ref 96–112)
Creat: 1.31 mg/dL (ref 0.50–1.35)
GLUCOSE: 103 mg/dL — AB (ref 70–99)
POTASSIUM: 3.6 meq/L (ref 3.5–5.3)
Sodium: 141 mEq/L (ref 135–145)
Total Protein: 6.5 g/dL (ref 6.0–8.3)

## 2014-01-14 NOTE — Progress Notes (Signed)
Primary Care Physician:  Glo Herring., MD  Primary Gastroenterologist:  Garfield Cornea, MD   Chief Complaint  Patient presents with  . none    he is not sure why he is here except PCP told him to come    HPI:  Colin [name] is a 73 y.o. male here at the request of Dr. Gerarda Fraction for further evaluation of hepatitis C. Patient reports he was not sure why was here today. He has a about his chronic hepatitis C for years but is not "bothered by it". He saw all Allen Norris, NP with Dr. Laural Golden back in 2013 for hepatitis C but did not want to pursue treatment at that time. Patient denies any abdominal pain, pruritus, fatigue, constipation, diarrhea, melena, rectal bleeding, dysphagia. Denies heartburn on his Prilosec. Reports he's never had a EGD. Discussed possibility of Barrett's esophagus and chronic GERD the patient declines EGD. He reports having a previous colonoscopy and had a small polyp which could not be retrieved. This was in the remote past. He declines on further colonoscopies.  He reports his hepatitis C is likely from his remote IV drug use. HCV RNA was positive in 2012, 1,270,000 international units per mL. Genotype was not done. I do not have any recent labs.  Current Outpatient Prescriptions  Medication Sig Dispense Refill  . HYDROcodone-acetaminophen (LORCET HD) 10-325 MG per tablet Take 1 tablet by mouth every 6 (six) hours as needed for pain.      Marland Kitchen losartan (COZAAR) 100 MG tablet Take 100 mg by mouth daily.        . Multiple Vitamins-Minerals (MULTIVITAMIN WITH MINERALS) tablet Take 1 tablet by mouth daily.        Marland Kitchen omeprazole (PRILOSEC) 40 MG capsule Take 40 mg by mouth daily.      Marland Kitchen torsemide (DEMADEX) 20 MG tablet 3 tablets 3 (three) times daily.       No current facility-administered medications for this visit.    Allergies as of 01/14/2014  . (No Known Allergies)    Past Medical History  Diagnosis Date  . Diabetes mellitus     no longer on meds  . Hypertension    . Prostate cancer   . 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 Hepatitis C   . Arthritis   . Lower extremity edema   . Chronic venous insufficiency   . CKD (chronic kidney disease)     Past Surgical History  Procedure Laterality Date  . Joint replacement      Bilateral knee replacement  . Endovenous ablation saphenous vein w/ laser  2013    Right leg X 2 by Dr. Debara Pickett  . Left knee replacement      x2  . Right knee replacement    . Tonsillectomy    . Cataract extraction w/phaco Left 12/14/2012    Procedure: CATARACT EXTRACTION PHACO AND INTRAOCULAR LENS PLACEMENT (IOC);  Surgeon: Tonny Branch, MD;  Location: [facility];  Service: Ophthalmology;  Laterality: Left;  CDE: 13.74  . Cataract extraction w/phaco Right 12/28/2012    Procedure: CATARACT EXTRACTION PHACO AND INTRAOCULAR LENS PLACEMENT (IOC);  Surgeon: Tonny Branch, MD;  Location: [facility];  Service: Ophthalmology;  Laterality: Right;  CDE:12.80    Family History  Problem Relation Age of Onset  . Colon cancer Neg Hx   . Liver disease Neg Hx     History   Social History  . Marital Status: Divorced    Spouse Name:  N/A    Number of Children: 3  . Years of Education: N/A   Occupational History  . Not on file.   Social History Main Topics  . Smoking status: Former Smoker    Quit date: 05/04/1972  . Smokeless tobacco: Not on file  . Alcohol Use: No     Comment: no beer since 2013.  . Drug Use: No     Comment: Previous IV drug user ovr 30 yrs ago  . Sexual Activity: Not on file   Other Topics Concern  . Not on file   Social History Narrative  . No narrative on file      ROS:  General: Negative for anorexia, weight loss, fever, chills, fatigue, weakness. Eyes: Negative for vision changes.  ENT: Negative for hoarseness, difficulty swallowing , nasal congestion. CV: Negative for chest pain, angina, palpitations, dyspnea on exertion, peripheral edema.  Respiratory: Negative for dyspnea at rest, dyspnea on exertion, cough,  sputum, wheezing.  GI: See history of present illness. GU:  Negative for dysuria, hematuria, urinary incontinence, urinary frequency, nocturnal urination.  MS: Negative for joint pain, low back pain.  Derm: Negative for rash or itching.  Neuro: Negative for weakness, abnormal sensation, seizure, frequent headaches, memory loss, confusion.  Psych: Negative for anxiety, depression, suicidal ideation, hallucinations.  Endo: Negative for unusual weight change.  Heme: Negative for bruising or bleeding. Allergy: Negative for rash or hives.    Physical Examination:  BP 113/69  Pulse 73  Temp(Src) 98.6 F (37 C) (Oral)  Ht 5\' 7"  (1.702 m)  Wt 190 lb 3.2 oz (86.274 kg)  BMI 29.78 kg/m2   General: Well-nourished, well-developed in no acute distress.  Head: Normocephalic, atraumatic.   Eyes: Conjunctiva pink, no icterus. Mouth: Oropharyngeal mucosa moist and pink , no lesions erythema or exudate. Neck: Supple without thyromegaly, masses, or lymphadenopathy.  Lungs: Clear to auscultation bilaterally.  Heart: Regular rate and rhythm, no murmurs rubs or gallops.  Abdomen: Bowel sounds are normal, nontender, nondistended, no hepatosplenomegaly or masses, no abdominal bruits or    hernia , no rebound or guarding.   Rectal: Not performed Extremities: No lower extremity edema. No clubbing or deformities.  Neuro: Alert and oriented x 4 , grossly normal neurologically.  Skin: Warm and dry, no rash or jaundice.   Psych: Alert and cooperative, normal mood and affect.  Imaging Studies: US Renal  01/05/2014   CLINICAL DATA:  Stage IV chronic renal disease  EXAM: RETROPERITONEAL ULTRASOUND  COMPARISON:  CT abdomen and pelvis January 27, 2013  FINDINGS: Right Kidney:  Length: 11.6 cm. Echogenicity and renal cortical thickness within normal limits. No hydronephrosis or perinephric fluid visualized. There is a simple cyst arising from the lower pole right kidney measuring 1.8 x 1.7 x 1.8 cm. No noncystic renal  masses are identified. There is no demonstrable renal calculus or ureterectasis.  Left Kidney:  Length: 12.8 cm. Echogenicity within normal limits. No mass, perinephric fluid, or hydronephrosis visualized. There is no demonstrable renal calculus or ureterectasis.  Bladder:  Appears normal for degree of bladder distention.  IMPRESSION: Cyst arising from lower pole right kidney. Study otherwise unremarkable.   Electronically Signed   By: Lowella Grip M.D.   On: 01/05/2014 13:09

## 2014-01-14 NOTE — Patient Instructions (Signed)
1. Please collect your stool specimen and return to our office. 2. Please have your labs and ultrasound done.  Hepatitis C Hepatitis C is a viral infection of the liver. Infection may go undetected for months or years because symptoms may be absent or very mild. Chronic liver disease is the main danger of hepatitis C. This may lead to scarring of the liver (cirrhosis), liver failure, and liver cancer. CAUSES  Hepatitis C is caused by the hepatitis C virus (HCV). Formerly, hepatitis C infections were most commonly transmitted through blood transfusions. In the early 1990s, routine testing of donated blood for hepatitis C and exclusion of blood that tests positive for HCV began. Now, HCV is most commonly transmitted from person to person through injection drug use, sharing needles, or sex with an infected person. A caregiver may also get the infection from exposure to the blood of an infected patient by way of a cut or needle stick.  SYMPTOMS  Acute Phase Many cases of acute HCV infection are mild and cause few problems.Some people may not even realize they are sick.Symptoms in others may last a few weeks to several months and include:  Feeling very tired.  Loss of appetite.  Nausea.  Vomiting.  Abdominal pain.  Dark yellow urine.  Yellow skin and eyes (jaundice).  Itching of the skin. Chronic Phase  Between 50% to 85% of people who get HCV infection become "chronic carriers." They often have no symptoms, but the virus stays in their body.They may spread the virus to others and can get long-term liver disease.  Many people with chronic HCV infection remain healthy for many years. However, up to 1 in 5 chronically infected people may develop severe liver diseases including scarring of the liver (cirrhosis), liver failure, or liver cancer. DIAGNOSIS  Diagnosis of hepatitis C infection is made by testing blood for the presence of hepatitis C viral particles called RNA. Other tests may  also be done to measure the status of current liver function, exclude other liver problems, or assess liver damage. TREATMENT  Treatment with many antiviral drugs is available and recommended for some patients with chronic HCV infection. Drug treatment is generally considered appropriate for patients who:  Are 58 years of age or older.  Have a positive test for HCV particles in the blood.  Have a liver tissue sample (biopsy) that shows chronic hepatitis and significant scarring (fibrosis).  Do not have signs of liver failure.  Have acceptable blood test results that confirm the wellness of other body organs.  Are willing to be treated and conform to treatment requirements.  Have no other circumstances that would prevent treatment from being recommended (contraindications). All people who are offered and choose to receive drug treatment must understand that careful medical follow up for many months and even years is crucial in order to make successful care possible. The goal of drug treatment is to eliminate any evidence of HCV in the blood on a long-term basis. This is called a "sustained virologic response" or SVR. Achieving a SVR is associated with a decrease in the chance of life-threatening liver problems, need for a liver transplant, liver cancer rates, and liver-related complications. Successful treatment currently requires taking treatment drugs for at least 24 weeks and up to 72 weeks. An injected drug (interferon) given weekly and an oral antiviral medicine taken daily are usually prescribed. Side effects from these drugs are common and some may be very serious. Your response to treatment must be carefully monitored by  both you and your caregiver throughout the entire treatment period. PREVENTION There is no vaccine for hepatitis C. The only way to prevent the disease is to reduce the risk of exposure to the virus.   Avoid sharing drug needles or personal items like toothbrushes,  razors, and nail clippers with an infected person.  Healthcare workers need to avoid injuries and wear appropriate protective equipment such as gloves, gowns, and face masks when performing invasive medical or nursing procedures. HOME CARE INSTRUCTIONS  To avoid making your liver disease worse:  Strictly avoid drinking alcohol.  Carefully review all new prescriptions of medicines with your caregiver. Ask your caregiver which drugs you should avoid. The following drugs are toxic to the liver, and your caregiver may tell you to avoid them:  Isoniazid.  Methyldopa.  Acetaminophen.  Anabolic steroids (muscle-building drugs).  Erythromycin.  Oral contraceptives (birth control pills).  Check with your caregiver to make sure medicine you are currently taking will not be harmful.  Periodic blood tests may be required. Follow your caregiver's advice about when you should have blood tests.  Avoid a sexual relationship until advised otherwise by your caregiver.  Avoid activities that could expose other people to your blood. Examples include sharing a toothbrush, nail clippers, razors, and needles.  Bed rest is not necessary, but it may make you feel better. Recovery time is not related to the amount of rest you receive.  This infection is contagious. Follow your caregiver's instructions in order to avoid spread of the infection. SEEK IMMEDIATE MEDICAL CARE IF:  You have increasing fatigue or weakness.  You have an oral temperature above 102 F (38.9 C), not controlled by medicine.  You develop loss of appetite, nausea, or vomiting.  You develop jaundice.  You develop easy bruising or bleeding.  You develop any severe problems as a result of your treatment. MAKE SURE YOU:   Understand these instructions.  Will watch your condition.  Will get help right away if you are not doing well or get worse. Document Released: 06/21/2000 Document Revised: 09/16/2011 Document Reviewed:  10/24/2010 [facility] Patient Information 2015 [place], [place]. This information is not intended to replace advice given to you by your health care provider. Make sure you discuss any questions you have with your health care provider.

## 2014-01-15 LAB — PROTIME-INR
INR: 1.16 (ref <1.50)
Prothrombin Time: 14.8 seconds (ref 11.6–15.2)

## 2014-01-17 ENCOUNTER — Encounter: Payer: Self-pay | Admitting: Gastroenterology

## 2014-01-17 ENCOUNTER — Ambulatory Visit (INDEPENDENT_AMBULATORY_CARE_PROVIDER_SITE_OTHER): Payer: Medicare Other | Admitting: Gastroenterology

## 2014-01-17 DIAGNOSIS — B182 Chronic viral hepatitis C: Secondary | ICD-10-CM

## 2014-01-17 LAB — IFOBT (OCCULT BLOOD): IFOBT: POSITIVE

## 2014-01-17 NOTE — Progress Notes (Signed)
cc'd to pcp 

## 2014-01-17 NOTE — Assessment & Plan Note (Signed)
Chronic hepatitis C, genotype unknown. Never treated. Patient declined previously. At this time he really does not want to pursue HCV treatment. I discussed various options, potential likelihood of minimal side effects, benefits of treatment but he simply is not sure that he wants to pursue it. Discussed at length need to have at least yearly monitoring of his liver looking for advanced liver disease related to his hepatitis C. He agreed to blood work at this time. We will also genotype him to help him further decide about treatment. Further recommendations to follow.

## 2014-01-17 NOTE — Assessment & Plan Note (Signed)
Patient with history of small polyp not retrieved at time of remote colonoscopy. He declines colonoscopy at this time but does agree to I. FOBT. If positive he states he may reconsider having a colonoscopy.

## 2014-01-17 NOTE — Progress Notes (Signed)
IFOBT Test Results (+) Positive

## 2014-01-18 ENCOUNTER — Ambulatory Visit (HOSPITAL_COMMUNITY)
Admission: RE | Admit: 2014-01-18 | Discharge: 2014-01-18 | Disposition: A | Discharge status: Home / Self Care | Payer: Medicare Other | Source: Ambulatory Visit | Attending: Gastroenterology | Admitting: Gastroenterology

## 2014-01-18 DIAGNOSIS — B182 Chronic viral hepatitis C: Secondary | ICD-10-CM | POA: Insufficient documentation

## 2014-01-18 DIAGNOSIS — K8020 Calculus of gallbladder without cholecystitis without obstruction: Secondary | ICD-10-CM | POA: Insufficient documentation

## 2014-01-18 DIAGNOSIS — K769 Liver disease, unspecified: Secondary | ICD-10-CM | POA: Insufficient documentation

## 2014-01-18 DIAGNOSIS — Z1211 Encounter for screening for malignant neoplasm of colon: Secondary | ICD-10-CM

## 2014-01-18 LAB — HCV RNA QUANT RFLX ULTRA OR GENOTYP
HCV Quantitative Log: 5.75 {Log} — ABNORMAL HIGH (ref <1.18)
HCV Quantitative: 557210 IU/mL — ABNORMAL HIGH (ref <15)

## 2014-01-21 LAB — HEPATITIS C GENOTYPE

## 2014-01-24 NOTE — Progress Notes (Signed)
Quick Note:  Please patient know he is significantly anemia. ifobt was positive. Chronic hep C, genotype 1 (patient aware of HCV status). Please let him know is has a 95% chance of being cured with treatment in case he changes his mind. Gallstones on U/S, no evidence of tumor or cirrhosis.   He needs colonoscopy +/- EGD (dx: anemia, heme +, chronic GERD, h/o colon polyp). With RMR. Recommend abd u/s in 1 year (hepatoma surveillance, chronic HCV). OV six months for f/u chronic HCV/liver disease. He previously declined HCV treatment at recent OV.  ______

## 2014-01-24 NOTE — Progress Notes (Signed)
Quick Note:  See lab result note. ______

## 2014-01-27 ENCOUNTER — Other Ambulatory Visit: Payer: Self-pay | Admitting: Internal Medicine

## 2014-01-27 DIAGNOSIS — R195 Other fecal abnormalities: Secondary | ICD-10-CM

## 2014-01-27 DIAGNOSIS — Z86010 Personal history of colonic polyps: Secondary | ICD-10-CM

## 2014-01-27 DIAGNOSIS — D500 Iron deficiency anemia secondary to blood loss (chronic): Secondary | ICD-10-CM

## 2014-01-27 DIAGNOSIS — D509 Iron deficiency anemia, unspecified: Secondary | ICD-10-CM

## 2014-01-27 DIAGNOSIS — K219 Gastro-esophageal reflux disease without esophagitis: Secondary | ICD-10-CM

## 2014-01-27 MED ORDER — PEG 3350-KCL-NA BICARB-NACL 420 G PO SOLR: 4000.0000 mL | ORAL | Status: DC

## 2014-01-27 NOTE — Progress Notes (Signed)
Routing to RMR to sign off on

## 2014-01-27 NOTE — Progress Notes (Signed)
Quick Note:  Discussed with Colin [name]. She said 02/14/14 would be within 30 days from OV. OK to schedule. ______

## 2014-01-27 NOTE — Progress Notes (Signed)
Quick Note:  Routing to RMR to sign off on ______

## 2014-01-28 ENCOUNTER — Encounter (HOSPITAL_COMMUNITY): Payer: Self-pay | Admitting: Pharmacy Technician

## 2014-02-11 DIAGNOSIS — D649 Anemia, unspecified: Secondary | ICD-10-CM | POA: Diagnosis not present

## 2014-02-11 DIAGNOSIS — B182 Chronic viral hepatitis C: Secondary | ICD-10-CM | POA: Diagnosis not present

## 2014-02-11 DIAGNOSIS — D131 Benign neoplasm of stomach: Secondary | ICD-10-CM | POA: Diagnosis not present

## 2014-02-11 DIAGNOSIS — K219 Gastro-esophageal reflux disease without esophagitis: Secondary | ICD-10-CM | POA: Diagnosis not present

## 2014-02-11 DIAGNOSIS — D126 Benign neoplasm of colon, unspecified: Secondary | ICD-10-CM | POA: Diagnosis not present

## 2014-02-11 DIAGNOSIS — K5730 Diverticulosis of large intestine without perforation or abscess without bleeding: Secondary | ICD-10-CM | POA: Diagnosis not present

## 2014-02-11 DIAGNOSIS — Z86010 Personal history of colonic polyps: Secondary | ICD-10-CM | POA: Diagnosis not present

## 2014-02-11 DIAGNOSIS — R195 Other fecal abnormalities: Secondary | ICD-10-CM | POA: Diagnosis not present

## 2014-02-14 ENCOUNTER — Ambulatory Visit (HOSPITAL_COMMUNITY)
Admission: RE | Admit: 2014-02-14 | Discharge: 2014-02-14 | Disposition: A | Discharge status: Home / Self Care | Payer: Medicare Other | Source: Ambulatory Visit | Attending: Internal Medicine | Admitting: Internal Medicine

## 2014-02-14 ENCOUNTER — Encounter (HOSPITAL_COMMUNITY)
Admission: RE | Disposition: A | Discharge status: Home / Self Care | Payer: Self-pay | Source: Ambulatory Visit | Attending: Internal Medicine

## 2014-02-14 DIAGNOSIS — K219 Gastro-esophageal reflux disease without esophagitis: Secondary | ICD-10-CM | POA: Diagnosis not present

## 2014-02-14 DIAGNOSIS — K317 Polyp of stomach and duodenum: Secondary | ICD-10-CM

## 2014-02-14 DIAGNOSIS — D126 Benign neoplasm of colon, unspecified: Secondary | ICD-10-CM | POA: Insufficient documentation

## 2014-02-14 DIAGNOSIS — B182 Chronic viral hepatitis C: Secondary | ICD-10-CM | POA: Insufficient documentation

## 2014-02-14 DIAGNOSIS — D131 Benign neoplasm of stomach: Secondary | ICD-10-CM

## 2014-02-14 DIAGNOSIS — K5730 Diverticulosis of large intestine without perforation or abscess without bleeding: Secondary | ICD-10-CM

## 2014-02-14 DIAGNOSIS — D649 Anemia, unspecified: Secondary | ICD-10-CM | POA: Insufficient documentation

## 2014-02-14 DIAGNOSIS — Z860100 Personal history of colon polyps, unspecified: Secondary | ICD-10-CM | POA: Insufficient documentation

## 2014-02-14 DIAGNOSIS — R195 Other fecal abnormalities: Secondary | ICD-10-CM | POA: Insufficient documentation

## 2014-02-14 DIAGNOSIS — D509 Iron deficiency anemia, unspecified: Secondary | ICD-10-CM

## 2014-02-14 HISTORY — PX: ESOPHAGOGASTRODUODENOSCOPY: SHX5428

## 2014-02-14 HISTORY — PX: COLONOSCOPY: SHX5424

## 2014-02-14 LAB — GLUCOSE, CAPILLARY: GLUCOSE-CAPILLARY: 104 mg/dL — AB (ref 70–99)

## 2014-02-14 SURGERY — COLONOSCOPY
Anesthesia: Moderate Sedation

## 2014-02-14 MED ORDER — MIDAZOLAM HCL 5 MG/5ML IJ SOLN
INTRAMUSCULAR | Status: AC
  Filled 2014-02-14: qty 10

## 2014-02-14 MED ORDER — LIDOCAINE VISCOUS 2 % MT SOLN
OROMUCOSAL | Status: AC
  Filled 2014-02-14: qty 30

## 2014-02-14 MED ORDER — LIDOCAINE VISCOUS 2 % MT SOLN
OROMUCOSAL | Status: DC | PRN
  Administered 2014-02-14: 1 via OROMUCOSAL

## 2014-02-14 MED ORDER — MEPERIDINE HCL 100 MG/ML IJ SOLN
INTRAMUSCULAR | Status: AC
  Filled 2014-02-14: qty 2

## 2014-02-14 MED ORDER — ONDANSETRON HCL 4 MG/2ML IJ SOLN
INTRAMUSCULAR | Status: DC | PRN
  Administered 2014-02-14: 4 mg via INTRAVENOUS

## 2014-02-14 MED ORDER — ONDANSETRON HCL 4 MG/2ML IJ SOLN
INTRAMUSCULAR | Status: AC
  Filled 2014-02-14: qty 2

## 2014-02-14 MED ORDER — MIDAZOLAM HCL 5 MG/5ML IJ SOLN
INTRAMUSCULAR | Status: DC | PRN
  Administered 2014-02-14 (×2): 2 mg via INTRAVENOUS
  Administered 2014-02-14 (×2): 1 mg via INTRAVENOUS

## 2014-02-14 MED ORDER — MEPERIDINE HCL 100 MG/ML IJ SOLN
INTRAMUSCULAR | Status: DC | PRN
  Administered 2014-02-14 (×2): 25 mg via INTRAVENOUS
  Administered 2014-02-14: 50 mg via INTRAVENOUS

## 2014-02-14 MED ORDER — STERILE WATER FOR IRRIGATION IR SOLN
Status: DC | PRN
  Administered 2014-02-14: 14:00:00

## 2014-02-14 MED ORDER — SODIUM CHLORIDE 0.9 % IV SOLN
INTRAVENOUS | Status: DC
  Administered 2014-02-14: 1000 mL via INTRAVENOUS

## 2014-02-14 NOTE — H&P (View-Only) (Signed)
Quick Note:  Please patient know he is significantly anemia. ifobt was positive. Chronic hep C, genotype 1 (patient aware of HCV status). Please let him know is has a 95% chance of being cured with treatment in case he changes his mind. Gallstones on U/S, no evidence of tumor or cirrhosis.   He needs colonoscopy +/- EGD (dx: anemia, heme +, chronic GERD, h/o colon polyp). With RMR. Recommend abd u/s in 1 year (hepatoma surveillance, chronic HCV). OV six months for f/u chronic HCV/liver disease. He previously declined HCV treatment at recent OV.  ______

## 2014-02-14 NOTE — Op Note (Signed)
[facility] [address] [place], [address]   COLONOSCOPY PROCEDURE REPORT  PATIENT: [name], Colin  MR#:         [id number] BIRTHDATE: 1941/06/24 , 73  yrs. old GENDER: Male ENDOSCOPIST: R.  Garfield Cornea, MD FACP FACG REFERRED BY:  Kerin Perna, M.D. PROCEDURE DATE:  02/14/2014 PROCEDURE:     Colonoscopy with snare polypectomy and hemostasis clips placement  INDICATIONS:  Hemoccult-positive stool  INFORMED CONSENT:  The risks, benefits, alternatives and imponderables including but not limited to bleeding, perforation as well as the possibility of a missed lesion have been reviewed.  The potential for biopsy, lesion removal, etc. have also been discussed.  Questions have been answered.  All parties agreeable. Please see the history and physical in the medical record for more information.  MEDICATIONS: Versed 6 mg IV and Demerol 100 mg IV in divided doses. Zofran 4 mg IV.  DESCRIPTION OF PROCEDURE:  After a digital rectal exam was performed, the EG-[id number] ([id number]) and [id number] ([id number]) colonoscope was advanced from the anus through the rectum and colon to the area of the cecum, ileocecal valve and appendiceal orifice. The cecum was deeply intubated.  These structures were well-seen and photographed for the record.  From the level of the cecum and ileocecal valve, the scope was slowly and cautiously withdrawn. The mucosal surfaces were carefully surveyed utilizing scope tip deflection to facilitate fold flattening as needed.  The scope was pulled down into the rectum where a thorough examination including retroflexion was performed.    FINDINGS:  adequate preparation; anal papilla; otherwise, normal rectum.  Scattered left-sided diverticula; (1) 1.5cm x 8 mm carpet polyp in the base of the cecum. The patient also had a 5 mm polyp in the mid descending segment; otherwise, the remainder of colonic mucosa appeared normal.  THERAPEUTIC / DIAGNOSTIC  MANEUVERS PERFORMED:  The above-mentioned polyps were hot snare removed. The cecal polypectomy site had slight oozing at the periphery  - left somewhat of a gaping defect. No evidence of any through and through injury.  I elected to close this polypectomy site with 3 clips  - (2) resolution and 1 instinct.  COMPLICATIONS: none  CECAL WITHDRAWAL TIME:  11 minutes  IMPRESSION:  Multiple colonic polyps-removed as described above. Colonic diverticulosis.  RECOMMENDATIONS: No MRI until clip gone. Followup on pathology.  See EGD report.   _______________________________ eSigned:  R. Garfield Cornea, MD FACP [facility] 02/14/2014 2:51 PM   CC:

## 2014-02-14 NOTE — Interval H&P Note (Signed)
History and Physical Interval Note:  02/14/2014 1:42 PM  Colin [name]  has presented today for surgery, with the diagnosis of ANEMIA, HEME POSITIVE STOOL, GERD, HISTORY OF COLON POLYP  The various methods of treatment have been discussed with the patient and family. After consideration of risks, benefits and other options for treatment, the patient has consented to  Procedure(s) with comments: COLONOSCOPY (N/A) - 230-moved to [place] notified pt ESOPHAGOGASTRODUODENOSCOPY (EGD) (N/A) as a surgical intervention .  The patient's history has been reviewed, patient examined, no change in status, stable for surgery.  I have reviewed the patient's chart and labs.  Questions were answered to the patient's satisfaction.     No change. Heme she colonoscopy per plan.The risks, benefits, limitations, imponderables and alternatives regarding both EGD and colonoscopy have been reviewed with the patient. Questions have been answered. All parties agreeable.    Manus Rudd

## 2014-02-14 NOTE — Op Note (Signed)
[facility] [address] [place], [address]   ENDOSCOPY PROCEDURE REPORT  PATIENT: Colin, [name]  MR#: [id number] BIRTHDATE: [birth year] , 73  yrs. old GENDER: Male ENDOSCOPIST: R.  Garfield Cornea, MD FACP FACG REFERRED BY:  Kerin Perna, M.D. PROCEDURE DATE:  02/14/2014 PROCEDURE:     EGD with gastric polypectomy and hemostasis clipping  INDICATIONS:     chronic GERD; Hemoccult-positive stool  INFORMED CONSENT:   The risks, benefits, limitations, alternatives and imponderables have been discussed.  The potential for biopsy, esophogeal dilation, etc. have also been reviewed.  Questions have been answered.  All parties agreeable.  Please see the history and physical in the medical record for more information.  MEDICATIONS:   Versed 4 mg IV and Demerol 75 mg IV in divided doses. Zofran 4 mg IV and Xylocaine gel orally  DESCRIPTION OF PROCEDURE:   The BL-[id number] ([id number])  endoscope was introduced through the mouth and advanced to the second portion of the duodenum without difficulty or limitations.  The mucosal surfaces were surveyed very carefully during advancement of the scope and upon withdrawal.  Retroflexion view of the proximal stomach and esophagogastric junction was performed.      FINDINGS: Normal esophagus. Stomach empty. (1) 9-10 mm pedunculated Antral polyp. Otherwise, the remainder of the gastric mucosa appeared normal. Patent pylorus. Normal first and second portion of the duodenum  THERAPEUTIC / DIAGNOSTIC MANEUVERS PERFORMED:  The above-mentioned polyp was clipped at its base to ensure hemostasis. Subsequently, the polyp was removed with hot snare cautery x1 pass. It was recovered with the Jabier Mutton net   COMPLICATIONS:  None  IMPRESSION:  Single large pedunculated gastric polyp as described - removed as described above.  RECOMMENDATIONS:   No MRI until clip gone. Followup on pathology. See colonoscopy  report.    _______________________________ R. Garfield Cornea, MD FACP [facility] eSigned:  R. Garfield Cornea, MD FACP [facility] 02/14/2014 2:14 PM     CC:  PATIENT NAME:  Colin, [name] MR#: [id number]

## 2014-02-14 NOTE — Discharge Instructions (Addendum)
Colonoscopy Discharge Instructions  Read the instructions outlined below and refer to this sheet in the next few weeks. These discharge instructions provide you with general information on caring for yourself after you leave the hospital. Your doctor may also give you specific instructions. While your treatment has been planned according to the most current medical practices available, unavoidable complications occasionally occur. If you have any problems or questions after discharge, call Dr. Gala Romney at [phone]. ACTIVITY  You may resume your regular activity, but move at a slower pace for the next 24 hours.   Take frequent rest periods for the next 24 hours.   Walking will help get rid of the air and reduce the bloated feeling in your belly (abdomen).   No driving for 24 hours (because of the medicine (anesthesia) used during the test).    Do not sign any important legal documents or operate any machinery for 24 hours (because of the anesthesia used during the test).  NUTRITION  Drink plenty of fluids.   You may resume your normal diet as instructed by your doctor.   Begin with a light meal and progress to your normal diet. Heavy or fried foods are harder to digest and may make you feel sick to your stomach (nauseated).   Avoid alcoholic beverages for 24 hours or as instructed.  MEDICATIONS  You may resume your normal medications unless your doctor tells you otherwise.  WHAT YOU CAN EXPECT TODAY  Some feelings of bloating in the abdomen.   Passage of more gas than usual.   Spotting of blood in your stool or on the toilet paper.  IF YOU HAD POLYPS REMOVED DURING THE COLONOSCOPY:  No aspirin products for 7 days or as instructed.   No alcohol for 7 days or as instructed.   Eat a soft diet for the next 24 hours.  FINDING OUT THE RESULTS OF YOUR TEST Not all test results are available during your visit. If your test results are not back during the visit, make an appointment  with your caregiver to find out the results. Do not assume everything is normal if you have not heard from your caregiver or the medical facility. It is important for you to follow up on all of your test results.  SEEK IMMEDIATE MEDICAL ATTENTION IF:  You have more than a spotting of blood in your stool.   Your belly is swollen (abdominal distention).   You are nauseated or vomiting.   You have a temperature over 101.  You have abdominal pain or discomfort that is severe or gets worse throughout the day. EGD Discharge instructions Please read the instructions outlined below and refer to this sheet in the next few weeks. These discharge instructions provide you with general information on caring for yourself after you leave the hospital. Your doctor may also give you specific instructions. While your treatment has been planned according to the most current medical practices available, unavoidable complications occasionally occur. If you have any problems or questions after discharge, please call your doctor. ACTIVITY You may resume your regular activity but move at a slower pace for the next 24 hours.  Take frequent rest periods for the next 24 hours.  Walking will help expel (get rid of) the air and reduce the bloated feeling in your abdomen.  No driving for 24 hours (because of the anesthesia (medicine) used during the test).  You may shower.  Do not sign any important legal documents or operate any machinery for 24  hours (because of the anesthesia used during the test).  NUTRITION Drink plenty of fluids.  You may resume your normal diet.  Begin with a light meal and progress to your normal diet.  Avoid alcoholic beverages for 24 hours or as instructed by your caregiver.  MEDICATIONS You may resume your normal medications unless your caregiver tells you otherwise.  WHAT YOU CAN EXPECT TODAY You may experience abdominal discomfort such as a feeling of fullness or gas pains.   FOLLOW-UP Your doctor will discuss the results of your test with you.  SEEK IMMEDIATE MEDICAL ATTENTION IF ANY OF THE FOLLOWING OCCUR: Excessive nausea (feeling sick to your stomach) and/or vomiting.  Severe abdominal pain and distention (swelling).  Trouble swallowing.  Temperature over 101 F (37.8 C).  Rectal bleeding or vomiting of blood.    You had polyps removed from your stomach and your colon. You also had diverticulosis.  You cannot have an MRI in the future until the clips placed are no longer present.  Office visit with [place] in 6 weeks  Further recommendations to follow pending review of pathology report     Diverticulosis Diverticulosis is the condition that develops when small pouches (diverticula) form in the wall of your colon. Your colon, or large intestine, is where water is absorbed and stool is formed. The pouches form when the inside layer of your colon pushes through weak spots in the outer layers of your colon. CAUSES  No one knows exactly what causes diverticulosis. RISK FACTORS Being older than 48. Your risk for this condition increases with age. Diverticulosis is rare in people younger than 40 years. By age 78, almost everyone has it. Eating a low-fiber diet. Being frequently constipated. Being overweight. Not getting enough exercise. Smoking. Taking over-the-counter pain medicines, like aspirin and ibuprofen. SYMPTOMS  Most people with diverticulosis do not have symptoms. DIAGNOSIS  Because diverticulosis often has no symptoms, health care providers often discover the condition during an exam for other colon problems. In many cases, a health care provider will diagnose diverticulosis while using a flexible scope to examine the colon (colonoscopy). TREATMENT  If you have never developed an infection related to diverticulosis, you may not need treatment. If you have had an infection before, treatment may include: Eating more fruits, vegetables, and  grains. Taking a fiber supplement. Taking a live bacteria supplement (probiotic). Taking medicine to relax your colon. HOME CARE INSTRUCTIONS  Drink at least 6-8 glasses of water each day to prevent constipation. Try not to strain when you have a bowel movement. Keep all follow-up appointments. If you have had an infection before: Increase the fiber in your diet as directed by your health care provider or dietitian. Take a dietary fiber supplement if your health care provider approves. Only take medicines as directed by your health care provider. SEEK MEDICAL CARE IF:  You have abdominal pain. You have bloating. You have cramps. You have not gone to the bathroom in 3 days. SEEK IMMEDIATE MEDICAL CARE IF:  Your pain gets worse. Yourbloating becomes very bad. You have a fever or chills, and your symptoms suddenly get worse. You begin vomiting. You have bowel movements that are bloody or black. MAKE SURE YOU: Understand these instructions. Will watch your condition. Will get help right away if you are not doing well or get worse. Document Released: 03/21/2004 Document Revised: 06/29/2013 Document Reviewed: 05/19/2013 [facility] Patient Information 2015 [place], [place]. This information is not intended to replace advice given to you by your health  care provider. Make sure you discuss any questions you have with your health care provider. Gastric Polyps Gastric polyps (also called stomach polyps) are growths on the inner lining of the stomach. They are clumps of cells that usually do not cause any symptoms. The polyps are often found when medical procedures are being performed for a different problem.  Gastric polyps are rare. However, several types of polyps can develop. Most are noncancerous (benign). Some are very small and do not require any treatment. Some are potentially harmful because of their size or location. Other types (adenomas) are known to cause complications and may become  cancerous if left alone. Polyps that may become harmful are usually removed. Removal is often successful in preventing problems from developing. CAUSES  Gastric polyps form when inflammation or damage to the stomach lining occurs. This may happen because of: Chronic stomach conditions, such as gastritis. Use of certain medicines for reducing stomach acid (proton pump inhibitors). An inherited condition called familial adenomatous polyposis. SYMPTOMS  Gastric polyps usually do not cause any symptoms. In rare cases, they may develop into ulcers or cause blockages. The following symptoms may occur: Abdominal pain or tenderness. Nausea. Trouble eating or swallowing. Blood in the stool. Reduced number of red blood cells in the blood (anemia). DIAGNOSIS  Most often, gastric polyps are found when a medical procedure called endoscopy is being performed to look for some other problem in the stomach area. During endoscopy, a thin tube with a tiny camera attached is placed down the throat and into the stomach. If polyps are discovered during endoscopy, your health care provider may: Examine the polyps closely to determine the type of polyps. Remove a tissue sample (biopsy) from the polyps for testing in a lab. A medical history will then be taken to assess risks and the possibility of inherited disorders.  TREATMENT  Treatment will depend on the type, location, and size of the polyps found. Often, treatment is not necessary. In that case, your health care provider may choose to monitor the polyps by performing endoscopy periodically. Polyps that cause symptoms or are potentially harmful will often be removed during endoscopy. If you have a stomach infection or other gastric condition, you may be given medicines to treat the condition. In rare cases, surgery (partial gastrectomy) may be done to remove very large polyps. HOME CARE INSTRUCTIONS  Only take over-the-counter or prescription medicines as directed  by your health care provider. Do not take any other medicines unless your health care provider approves. If your health care provider prescribes antibiotic medicines, take them as directed. Finish them even if you start to feel better. Follow up with your health care provider as directed. SEEK MEDICAL CARE IF:  You develop new or worsening symptoms. SEEK IMMEDIATE MEDICAL CARE IF: You throw up blood. You develop severe abdominal pain. You cannot eat or drink. You notice blood in your stool. Document Released: 06/10/2012 Document Revised: 11/08/2013 Document Reviewed: 06/10/2012 [facility] Patient Information 2015 [place], [place]. This information is not intended to replace advice given to you by your health care provider. Make sure you discuss any questions you have with your health care provider. Colon Polyps Polyps are lumps of extra tissue growing inside the body. Polyps can grow in the large intestine (colon). Most colon polyps are noncancerous (benign). However, some colon polyps can become cancerous over time. Polyps that are larger than a pea may be harmful. To be safe, caregivers remove and test all polyps. CAUSES  Polyps form when  mutations in the genes cause your cells to grow and divide even though no more tissue is needed. RISK FACTORS There are a number of risk factors that can increase your chances of getting colon polyps. They include: Being older than 50 years. Family history of colon polyps or colon cancer. Long-term colon diseases, such as colitis or Crohn disease. Being overweight. Smoking. Being inactive. Drinking too much alcohol. SYMPTOMS  Most small polyps do not cause symptoms. If symptoms are present, they may include: Blood in the stool. The stool may look dark red or black. Constipation or diarrhea that lasts longer than 1 week. DIAGNOSIS People often do not know they have polyps until their caregiver finds them during a regular checkup. Your caregiver can use 4  tests to check for polyps: Digital rectal exam. The caregiver wears gloves and feels inside the rectum. This test would find polyps only in the rectum. Barium enema. The caregiver puts a liquid called barium into your rectum before taking X-rays of your colon. Barium makes your colon look white. Polyps are dark, so they are easy to see in the X-ray pictures. Sigmoidoscopy. A thin, flexible tube (sigmoidoscope) is placed into your rectum. The sigmoidoscope has a light and tiny camera in it. The caregiver uses the sigmoidoscope to look at the last third of your colon. Colonoscopy. This test is like sigmoidoscopy, but the caregiver looks at the entire colon. This is the most common method for finding and removing polyps. TREATMENT  Any polyps will be removed during a sigmoidoscopy or colonoscopy. The polyps are then tested for cancer. PREVENTION  To help lower your risk of getting more colon polyps: Eat plenty of fruits and vegetables. Avoid eating fatty foods. Do not smoke. Avoid drinking alcohol. Exercise every day. Lose weight if recommended by your caregiver. Eat plenty of calcium and folate. Foods that are rich in calcium include milk, cheese, and broccoli. Foods that are rich in folate include chickpeas, kidney beans, and spinach. HOME CARE INSTRUCTIONS Keep all follow-up appointments as directed by your caregiver. You may need periodic exams to check for polyps. SEEK MEDICAL CARE IF: You notice bleeding during a bowel movement. Document Released: 03/20/2004 Document Revised: 09/16/2011 Document Reviewed: 09/03/2011 [facility] Patient Information 2015 [place], [place]. This information is not intended to replace advice given to you by your health care provider. Make sure you discuss any questions you have with your health care provider.

## 2014-02-16 ENCOUNTER — Encounter: Payer: Self-pay | Admitting: Internal Medicine

## 2014-02-18 ENCOUNTER — Encounter (HOSPITAL_COMMUNITY): Payer: Self-pay | Admitting: Internal Medicine

## 2014-04-14 ENCOUNTER — Encounter: Payer: Self-pay | Admitting: Internal Medicine

## 2014-05-16 ENCOUNTER — Ambulatory Visit (INDEPENDENT_AMBULATORY_CARE_PROVIDER_SITE_OTHER): Payer: Medicare Other | Admitting: Gastroenterology

## 2014-05-16 ENCOUNTER — Encounter: Payer: Self-pay | Admitting: Gastroenterology

## 2014-05-16 VITALS — BP 129/76 | HR 63 | Temp 97.4°F | Ht 67.0 in | Wt 210.0 lb

## 2014-05-16 DIAGNOSIS — D649 Anemia, unspecified: Secondary | ICD-10-CM | POA: Insufficient documentation

## 2014-05-16 DIAGNOSIS — B182 Chronic viral hepatitis C: Secondary | ICD-10-CM

## 2014-05-16 NOTE — Assessment & Plan Note (Signed)
Chronic hepatitis C, genotype 1A, treatment nave. Previous ultrasound without evidence of cirrhosis. Patient now interested in pursuing HCV treatment. Discussed Harvoni as treatment option. Discussed importance of compliance with medication and labs. He will notify us at anytime his medication list changes whether due to RX or OTC changes. We will start process to get approval for Harvoni. Update labs today.

## 2014-05-16 NOTE — Patient Instructions (Signed)
1. Please read over the [facility] information booklet. 2. We will start the approval process for Harvoni with your insurance company. 3. Please have your labs done. 4. Please notify us if you plan on taking any over the counter medications OR if you doctors change/add any new medications.

## 2014-05-16 NOTE — Assessment & Plan Note (Signed)
EGD/TCS as outlined. Recheck labs today.

## 2014-05-16 NOTE — Progress Notes (Signed)
Primary Care Physician: Glo Herring., MD  Primary Gastroenterologist:  Garfield Cornea, MD   Chief Complaint  Patient presents with  . Referral    HPI: Colin [name] is a 73 y.o. male here today again at the request of his PCP for hepatitis C treatment. He was initially seen back in July for the same. Previously seen by [name], NP with Dr. Laural Golden, in 2013 for the same. Patient declined hepatitis C treatment back then as well as recently when I saw him. States he's never been "bothered by it". He believes he was infected with hepatitis C when he was using IV drugs remotely.  At his July 2015 office visit we've advised an EGD to rule out Barrett's for his chronic GERD as well as a colonoscopy for his history of polyps (previously not retrieved). Initially he declined procedures however we checked labs which showed significant anemia, I FOBT was positive. Noted to have chronic hepatitis C, genotype 1. EGD and colonoscopy were done in August which showed multiple tubular adenomas, 9 millimeter pedunculated antral polyp which was hyperplastic. He will be due for repeat colonoscopy in 3 years.  Patient is now considering HCV treatment. States his PCP has continued to advise him to do it. He denies abdominal pain, vomiting, constipation, diarrhea, melena, rectal bleeding. Appetite good. Since his last office visit he came off omeprazole for unclear reasons. He denies any heartburn. Overall no complaints. Discussed HCV treatment at length. Patient has never been treated. Last alcohol consumption more than 2 years ago, even more remotely IV drug use decades ago. Denies any drug use at this time.  Current Outpatient Prescriptions  Medication Sig Dispense Refill  . HYDROcodone-acetaminophen (LORCET HD) 10-325 MG per tablet Take 1 tablet by mouth every 6 (six) hours as needed for pain.    . Multiple Vitamins-Minerals (MULTIVITAMIN WITH MINERALS) tablet Take 1 tablet by mouth daily.      Marland Kitchen  torsemide (DEMADEX) 20 MG tablet Take 3 tablets by mouth 3 (three) times daily.      No current facility-administered medications for this visit.    Allergies as of 05/16/2014  . (No Known Allergies)   Past Medical History  Diagnosis Date  . Diabetes mellitus     no longer on meds  . Hypertension   . Prostate cancer   . 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 Hepatitis C   . Arthritis   . Lower extremity edema   . Chronic venous insufficiency   . CKD (chronic kidney disease)    Past Surgical History  Procedure Laterality Date  . Joint replacement      Bilateral knee replacement  . Endovenous ablation saphenous vein w/ laser  2013    Right leg X 2 by Dr. Debara Pickett  . Left knee replacement      x2  . Right knee replacement    . Tonsillectomy    . Cataract extraction w/phaco Left 12/14/2012    Procedure: CATARACT EXTRACTION PHACO AND INTRAOCULAR LENS PLACEMENT (IOC);  Surgeon: Tonny Branch, MD;  Location: [facility];  Service: Ophthalmology;  Laterality: Left;  CDE: 13.74  . Cataract extraction w/phaco Right 12/28/2012    Procedure: CATARACT EXTRACTION PHACO AND INTRAOCULAR LENS PLACEMENT (IOC);  Surgeon: Tonny Branch, MD;  Location: [facility];  Service: Ophthalmology;  Laterality: Right;  CDE:12.80  . Colonoscopy N/A 02/14/2014    KWI:OXBDZHGD colonic polyps-removed as described above Colonic diverticulosis (tubular adenoma)  .  Esophagogastroduodenoscopy N/A 02/14/2014    EUM:PNTIRW large pedunculated gastric polyp (hyperplastic)    ROS:  General: Negative for anorexia, weight loss, fever, chills, fatigue, weakness. ENT: Negative for hoarseness, difficulty swallowing , nasal congestion. CV: Negative for chest pain, angina, palpitations, dyspnea on exertion, peripheral edema.  Respiratory: Negative for dyspnea at rest, dyspnea on exertion, cough, sputum, wheezing.  GI: See history of present illness. GU:  Negative for dysuria, hematuria, urinary incontinence, urinary frequency, nocturnal  urination.  Endo: Negative for unusual weight change.  MS: chronic leg pain   Physical Examination:   BP 129/76 mmHg  Pulse 63  Temp(Src) 97.4 F (36.3 C) (Oral)  Ht 5\' 7"  (1.702 m)  Wt 210 lb (95.255 kg)  BMI 32.88 kg/m2  General: Well-nourished, well-developed in no acute distress.  Eyes: No icterus. Mouth: Oropharyngeal mucosa moist and pink , no lesions erythema or exudate. Lungs: Clear to auscultation bilaterally.  Heart: Regular rate and rhythm, no murmurs rubs or gallops.  Abdomen: Bowel sounds are normal, nontender, nondistended, no hepatosplenomegaly or masses, no abdominal bruits or hernia , no rebound or guarding.   Extremities: No lower extremity edema. No clubbing or deformities. Neuro: Alert and oriented x 4   Skin: Warm and dry, no jaundice.   Psych: Alert and cooperative, normal mood and affect.

## 2014-05-17 LAB — CBC WITH DIFFERENTIAL/PLATELET
Basophils Absolute: 0 10*3/uL (ref 0.0–0.1)
Basophils Relative: 0 % (ref 0–1)
EOS PCT: 3 % (ref 0–5)
Eosinophils Absolute: 0.3 10*3/uL (ref 0.0–0.7)
HCT: 35.6 % — ABNORMAL LOW (ref 39.0–52.0)
HEMOGLOBIN: 12.1 g/dL — AB (ref 13.0–17.0)
LYMPHS ABS: 1.6 10*3/uL (ref 0.7–4.0)
LYMPHS PCT: 16 % (ref 12–46)
MCH: 29 pg (ref 26.0–34.0)
MCHC: 34 g/dL (ref 30.0–36.0)
MCV: 85.4 fL (ref 78.0–100.0)
MONO ABS: 0.8 10*3/uL (ref 0.1–1.0)
MONOS PCT: 8 % (ref 3–12)
Neutro Abs: 7.4 10*3/uL (ref 1.7–7.7)
Neutrophils Relative %: 73 % (ref 43–77)
Platelets: 278 10*3/uL (ref 150–400)
RBC: 4.17 MIL/uL — AB (ref 4.22–5.81)
RDW: 16 % — AB (ref 11.5–15.5)
WBC: 10.2 10*3/uL (ref 4.0–10.5)

## 2014-05-17 LAB — COMPREHENSIVE METABOLIC PANEL
ALK PHOS: 127 U/L — AB (ref 39–117)
ALT: 25 U/L (ref 0–53)
AST: 36 U/L (ref 0–37)
Albumin: 4.3 g/dL (ref 3.5–5.2)
BILIRUBIN TOTAL: 1 mg/dL (ref 0.2–1.2)
BUN: 32 mg/dL — ABNORMAL HIGH (ref 6–23)
CO2: 29 mEq/L (ref 19–32)
Calcium: 9.4 mg/dL (ref 8.4–10.5)
Chloride: 98 mEq/L (ref 96–112)
Creat: 1.74 mg/dL — ABNORMAL HIGH (ref 0.50–1.35)
GLUCOSE: 90 mg/dL (ref 70–99)
Potassium: 4.3 mEq/L (ref 3.5–5.3)
SODIUM: 140 meq/L (ref 135–145)
TOTAL PROTEIN: 7.3 g/dL (ref 6.0–8.3)

## 2014-05-17 LAB — IRON AND TIBC
%SAT: 11 % — AB (ref 20–55)
Iron: 33 ug/dL — ABNORMAL LOW (ref 42–165)
TIBC: 305 ug/dL (ref 215–435)
UIBC: 272 ug/dL (ref 125–400)

## 2014-05-17 LAB — PROTIME-INR
INR: 1.08 (ref <1.50)
Prothrombin Time: 14 seconds (ref 11.6–15.2)

## 2014-05-17 LAB — FERRITIN: Ferritin: 344 ng/mL — ABNORMAL HIGH (ref 22–322)

## 2014-05-17 NOTE — Progress Notes (Signed)
cc'ed to pcp °

## 2014-05-30 NOTE — Progress Notes (Signed)
Quick Note:  Please let patient know his labs show improved Hgb. He has some increased creatinine c/w known chronic renal disease. His estimated GFR is 40.  If he is still interested in [place], let's start Bioplus referral. Find out if he will need elastography or Fibrosure test. Order whichever is required by his insurance.    ______

## 2014-06-06 NOTE — Progress Notes (Signed)
Quick Note:  Pt is aware of results. OK to start the process for Harvoni. ______

## 2014-06-21 ENCOUNTER — Other Ambulatory Visit: Payer: Self-pay

## 2014-06-21 ENCOUNTER — Telehealth: Payer: Self-pay | Admitting: Gastroenterology

## 2014-06-21 DIAGNOSIS — D649 Anemia, unspecified: Secondary | ICD-10-CM

## 2014-06-21 DIAGNOSIS — B182 Chronic viral hepatitis C: Secondary | ICD-10-CM

## 2014-06-21 NOTE — Telephone Encounter (Signed)
Patient needs U/S with elastography in preparation of Harvoni.

## 2014-06-21 NOTE — Telephone Encounter (Signed)
Called pt and he is aware of  U/S appointment.  Which is 06/24/2014 @ 845am. Called [employer] and no PA is required per machine.

## 2014-06-21 NOTE — Telephone Encounter (Signed)
I spoke with the pt and he said it was ok to schedule u/s with elastography.   Please schedule.

## 2014-06-24 ENCOUNTER — Ambulatory Visit (HOSPITAL_COMMUNITY)
Admission: RE | Admit: 2014-06-24 | Discharge: 2014-06-24 | Disposition: A | Discharge status: Home / Self Care | Payer: Medicare Other | Source: Ambulatory Visit | Attending: Gastroenterology | Admitting: Gastroenterology

## 2014-06-24 DIAGNOSIS — B182 Chronic viral hepatitis C: Secondary | ICD-10-CM | POA: Diagnosis not present

## 2014-06-24 DIAGNOSIS — D649 Anemia, unspecified: Secondary | ICD-10-CM | POA: Diagnosis not present

## 2014-07-04 NOTE — Progress Notes (Signed)
Quick Note:  . ______ 

## 2014-07-04 NOTE — Progress Notes (Signed)
Quick Note:  Please let pt know his has gallstones, fatty liver, F0/F1 score with minimal fibrosis. We will push forward on the paperwork for HCV approval but may be declined since very little fibrosis. ______

## 2014-07-06 ENCOUNTER — Encounter: Payer: Self-pay | Admitting: Internal Medicine

## 2014-07-07 NOTE — Progress Notes (Signed)
Quick Note:  Paperwork completed and placed on Julie's desk. ______

## 2014-08-02 DIAGNOSIS — Z8546 Personal history of malignant neoplasm of prostate: Secondary | ICD-10-CM | POA: Diagnosis not present

## 2014-08-02 DIAGNOSIS — E1129 Type 2 diabetes mellitus with other diabetic kidney complication: Secondary | ICD-10-CM | POA: Diagnosis not present

## 2014-08-02 DIAGNOSIS — I10 Essential (primary) hypertension: Secondary | ICD-10-CM | POA: Diagnosis not present

## 2014-08-02 DIAGNOSIS — Z6834 Body mass index (BMI) 34.0-34.9, adult: Secondary | ICD-10-CM | POA: Diagnosis not present

## 2014-08-02 DIAGNOSIS — R32 Unspecified urinary incontinence: Secondary | ICD-10-CM | POA: Diagnosis not present

## 2014-08-16 ENCOUNTER — Ambulatory Visit (INDEPENDENT_AMBULATORY_CARE_PROVIDER_SITE_OTHER): Payer: Medicare Other | Admitting: Gastroenterology

## 2014-08-16 ENCOUNTER — Encounter: Payer: Self-pay | Admitting: Gastroenterology

## 2014-08-16 VITALS — BP 126/73 | HR 80 | Temp 97.6°F | Ht 67.0 in | Wt 219.8 lb

## 2014-08-16 DIAGNOSIS — D649 Anemia, unspecified: Secondary | ICD-10-CM | POA: Diagnosis not present

## 2014-08-16 DIAGNOSIS — B182 Chronic viral hepatitis C: Secondary | ICD-10-CM | POA: Diagnosis not present

## 2014-08-16 NOTE — Progress Notes (Signed)
      Primary Care Physician: Glo Herring., MD  Primary Gastroenterologist:  Garfield Cornea, MD   Chief Complaint  Patient presents with  . Follow-up    HPI: Colin [name] is a 74 y.o. male here for f/u of chronic Hepatitis C, genotype 1a. Awaiting approval for Harvoni. H/O chronic renal insufficiency, last calculated GFR at 40. He is HCV treatment naive. F0/F1 on elastography.   Patient is feeling well. Denies abdominal pain, vomiting, heartburn, dysphagia, melena, brbpr.   Current Outpatient Prescriptions  Medication Sig Dispense Refill  . HYDROcodone-acetaminophen (LORCET HD) 10-325 MG per tablet Take 1 tablet by mouth every 6 (six) hours as needed for pain.    . Multiple Vitamins-Minerals (MULTIVITAMIN WITH MINERALS) tablet Take 1 tablet by mouth daily.      Marland Kitchen torsemide (DEMADEX) 20 MG tablet Take 3 tablets by mouth 3 (three) times daily.      No current facility-administered medications for this visit.    Allergies as of 08/16/2014  . (No Known Allergies)    ROS:  General: Negative for anorexia, weight loss, fever, chills, fatigue, weakness. ENT: Negative for hoarseness, difficulty swallowing , nasal congestion. CV: Negative for chest pain, angina, palpitations, dyspnea on exertion, peripheral edema.  Respiratory: Negative for dyspnea at rest, dyspnea on exertion, cough, sputum, wheezing.  GI: See history of present illness. GU:  Negative for dysuria, hematuria, urinary incontinence, urinary frequency, nocturnal urination.  Endo: Negative for unusual weight change.    Physical Examination:   BP 126/73 mmHg  Pulse 80  Temp(Src) 97.6 F (36.4 C) (Oral)  Ht 5\' 7"  (1.702 m)  Wt 219 lb 12.8 oz (99.701 kg)  BMI 34.42 kg/m2  General: Well-nourished, well-developed in no acute distress.  Eyes: No icterus. Mouth: Oropharyngeal mucosa moist and pink , no lesions erythema or exudate. Lungs: Clear to auscultation bilaterally.  Heart: Regular rate and rhythm, no  murmurs rubs or gallops.  Abdomen: Bowel sounds are normal, nontender, nondistended, no hepatosplenomegaly or masses, no abdominal bruits or hernia , no rebound or guarding.   Extremities: No lower extremity edema. No clubbing or deformities. Neuro: Alert and oriented x 4   Skin: Warm and dry, no jaundice.   Psych: Alert and cooperative, normal mood and affect.   Imaging Studies: No results found.

## 2014-08-16 NOTE — Patient Instructions (Signed)
1. We will continue to work on getting your medication approved. Further recommendations to follow.

## 2014-08-18 ENCOUNTER — Telehealth: Payer: Self-pay

## 2014-08-18 DIAGNOSIS — K7460 Unspecified cirrhosis of liver: Secondary | ICD-10-CM

## 2014-08-18 DIAGNOSIS — Z79899 Other long term (current) drug therapy: Secondary | ICD-10-CM

## 2014-08-18 NOTE — Telephone Encounter (Signed)
Called pt- his harvoni arrived today. It is at the front desk for him to pick up. I advised him he would have to sign for the medication and he said that was fine. He said he may come tomorrow, he is aware that we close at noon on Friday.

## 2014-08-19 DIAGNOSIS — Z79899 Other long term (current) drug therapy: Secondary | ICD-10-CM | POA: Diagnosis not present

## 2014-08-19 DIAGNOSIS — K7460 Unspecified cirrhosis of liver: Secondary | ICD-10-CM | POA: Diagnosis not present

## 2014-08-19 LAB — BASIC METABOLIC PANEL
BUN: 32 mg/dL — AB (ref 6–23)
CO2: 27 meq/L (ref 19–32)
CREATININE: 1.58 mg/dL — AB (ref 0.50–1.35)
Calcium: 9.4 mg/dL (ref 8.4–10.5)
Chloride: 100 mEq/L (ref 96–112)
Glucose, Bld: 131 mg/dL — ABNORMAL HIGH (ref 70–99)
Potassium: 3.8 mEq/L (ref 3.5–5.3)
Sodium: 139 mEq/L (ref 135–145)

## 2014-08-19 LAB — PROTIME-INR
INR: 1.11 (ref <1.50)
PROTHROMBIN TIME: 14.3 s (ref 11.6–15.2)

## 2014-08-19 LAB — CBC WITH DIFFERENTIAL/PLATELET
BASOS ABS: 0 10*3/uL (ref 0.0–0.1)
Basophils Relative: 0 % (ref 0–1)
EOS PCT: 8 % — AB (ref 0–5)
Eosinophils Absolute: 0.7 10*3/uL (ref 0.0–0.7)
HCT: 32.3 % — ABNORMAL LOW (ref 39.0–52.0)
Hemoglobin: 10.6 g/dL — ABNORMAL LOW (ref 13.0–17.0)
Lymphocytes Relative: 19 % (ref 12–46)
Lymphs Abs: 1.6 10*3/uL (ref 0.7–4.0)
MCH: 28.3 pg (ref 26.0–34.0)
MCHC: 32.8 g/dL (ref 30.0–36.0)
MCV: 86.1 fL (ref 78.0–100.0)
MONO ABS: 0.8 10*3/uL (ref 0.1–1.0)
MPV: 9.7 fL (ref 8.6–12.4)
Monocytes Relative: 9 % (ref 3–12)
NEUTROS PCT: 64 % (ref 43–77)
Neutro Abs: 5.4 10*3/uL (ref 1.7–7.7)
Platelets: 343 10*3/uL (ref 150–400)
RBC: 3.75 MIL/uL — ABNORMAL LOW (ref 4.22–5.81)
RDW: 14.7 % (ref 11.5–15.5)
WBC: 8.5 10*3/uL (ref 4.0–10.5)

## 2014-08-19 LAB — HEPATIC FUNCTION PANEL
ALK PHOS: 131 U/L — AB (ref 39–117)
ALT: 22 U/L (ref 0–53)
AST: 25 U/L (ref 0–37)
Albumin: 4 g/dL (ref 3.5–5.2)
BILIRUBIN TOTAL: 0.5 mg/dL (ref 0.2–1.2)
Bilirubin, Direct: 0.2 mg/dL (ref 0.0–0.3)
Indirect Bilirubin: 0.3 mg/dL (ref 0.2–1.2)
Total Protein: 7.6 g/dL (ref 6.0–8.3)

## 2014-08-19 NOTE — Telephone Encounter (Signed)
Pt came in the office to pick up Colin [name]. I gave him the orders for the bloodwork. He said he would go have it done. He is aware to not start Harvoni until he hears from [place] about his blood work. Pt verbalized understanding of instructions.

## 2014-08-19 NOTE — Telephone Encounter (Signed)
OK. Let's update his labs so we can get started.  Tell him to NOT start the meds until we have call him with lab results and give him the go ahead.

## 2014-08-20 NOTE — Assessment & Plan Note (Signed)
Chronic hepatitis C, genotype 1A, treatment naive. F0/F1 on elastography. Awaiting Harvoni approval/assistance with high copay. We will start treatment once approved. Patient aware of importance of compliance with medication and labs. He will notify us of any medication changes RX or OTC.

## 2014-08-20 NOTE — Assessment & Plan Note (Signed)
F/u with labs at this time. Currently off PPI therapy and asymptomatic.

## 2014-08-23 NOTE — Telephone Encounter (Signed)
APPOINTMENT MADE AND PATIENT CALLED. AWARE OF DATE AND TIME OF APPOINTMENT

## 2014-08-23 NOTE — Telephone Encounter (Signed)
Quick Note:  Calculated GFR of 44. OK to start [place] one tab daily. Please send patient copy of below in letter form and verbally tell patient as well. Please document what day patient plans on starting. He needs ov with me in 4 weeks. We will make arrangements for labs at that time.   Send to patient in a letter. -Please start Harvoni one tablet daily. Take at the same time every day. Do not skip a dose. Please keep your medication safe. -Do not start any new medications without consulting [place] first. This includes prescription medications or medications you can buy without a prescription. This includes all medications including vitamins and supplements, antacids, etc. -Please be sure to get plenty of rest and drink plenty of fluids to keep your urine light yellow. This will help you copy with the side effects of fatigue and headache. -We will check your blood work in four weeks and have you return to the office at that time. -If you have any questions or concerns, please call us immediately. ______

## 2014-08-27 NOTE — Progress Notes (Signed)
CC'ED TO PCP 

## 2014-09-20 ENCOUNTER — Ambulatory Visit (INDEPENDENT_AMBULATORY_CARE_PROVIDER_SITE_OTHER): Payer: Medicare Other | Admitting: Nurse Practitioner

## 2014-09-20 ENCOUNTER — Encounter: Payer: Self-pay | Admitting: Nurse Practitioner

## 2014-09-20 VITALS — BP 123/73 | HR 72 | Temp 98.2°F | Ht 67.0 in | Wt 227.6 lb

## 2014-09-20 DIAGNOSIS — B182 Chronic viral hepatitis C: Secondary | ICD-10-CM

## 2014-09-20 NOTE — Progress Notes (Signed)
Referring Provider: Redmond School, MD Primary Care Physician:  Glo Herring., MD Primary GI:  Dr. Gala Romney  Chief Complaint  Patient presents with  . Follow-up    HPI:   74 year old male presents for follow-up on initiation of Hep C treatment with Harvoni. Last viral load was 557210 on 01/14/14, elastography F0/F1. Started treatment about 1 month ago.  Today states he's doing quite well. Finished the first bottle and is starting the second. States his medication was 1 pill short and he contacted Harvoni and they were supposed to send a single pill to our office. The second bottle that he counted was accurate. Denies headaches, worsening fatigue, jaundice, dark urine. Is drinking a lot of fluids. Denies any excessive bruising or bleeding. Denies any other symptoms at this time.  Past Medical History  Diagnosis Date  . Diabetes mellitus     no longer on meds  . Hypertension   . Prostate cancer   . 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 Hepatitis C   . Arthritis   . Lower extremity edema   . Chronic venous insufficiency   . CKD (chronic kidney disease)     Past Surgical History  Procedure Laterality Date  . Joint replacement      Bilateral knee replacement  . Endovenous ablation saphenous vein w/ laser  2013    Right leg X 2 by Dr. Debara Pickett  . Left knee replacement      x2  . Right knee replacement    . Tonsillectomy    . Cataract extraction w/phaco Left 12/14/2012    Procedure: CATARACT EXTRACTION PHACO AND INTRAOCULAR LENS PLACEMENT (IOC);  Surgeon: Tonny Branch, MD;  Location: [facility];  Service: Ophthalmology;  Laterality: Left;  CDE: 13.74  . Cataract extraction w/phaco Right 12/28/2012    Procedure: CATARACT EXTRACTION PHACO AND INTRAOCULAR LENS PLACEMENT (IOC);  Surgeon: Tonny Branch, MD;  Location: [facility];  Service: Ophthalmology;  Laterality: Right;  CDE:12.80  . Colonoscopy N/A 02/14/2014    YHC:WCBJSEGB colonic polyps-removed as described above Colonic diverticulosis  (tubular adenoma)  . Esophagogastroduodenoscopy N/A 02/14/2014    TDV:VOHYWV large pedunculated gastric polyp (hyperplastic)    Current Outpatient Prescriptions  Medication Sig Dispense Refill  . HYDROcodone-acetaminophen (LORCET HD) 10-325 MG per tablet Take 1 tablet by mouth every 6 (six) hours as needed for pain.    . Ledipasvir-Sofosbuvir 90-400 MG TABS Take by mouth.    . Multiple Vitamins-Minerals (MULTIVITAMIN WITH MINERALS) tablet Take 1 tablet by mouth daily.      Marland Kitchen torsemide (DEMADEX) 20 MG tablet Take 3 tablets by mouth 3 (three) times daily.      No current facility-administered medications for this visit.    Allergies as of 09/20/2014  . (No Known Allergies)    Family History  Problem Relation Age of Onset  . Colon cancer Neg Hx   . Liver disease Neg Hx     History   Social History  . Marital Status: Divorced    Spouse Name: N/A  . Number of Children: 3  . Years of Education: N/A   Social History Main Topics  . Smoking status: Former Smoker    Quit date: 05/04/1972  . Smokeless tobacco: Not on file  . Alcohol Use: No     Comment: no beer since 2013.  . Drug Use: No     Comment: Previous IV drug user ovr 30 yrs ago  . Sexual Activity: Not  on file   Other Topics Concern  . None   Social History Narrative    Review of Systems: Gen: Denies fever, chills, anorexia. Denies fatigue, weakness, weight loss.  CV: Denies chest pain, palpitations, syncope, peripheral edema, and claudication. Resp: Denies dyspnea at rest, cough, wheezing, coughing up blood, and pleurisy. GI: Denies vomiting blood, jaundice, and fecal incontinence.   Denies dysphagia or odynophagia. Derm: Denies rash, itching, dry skin Psych: Denies depression, anxiety, memory loss, confusion. No homicidal or suicidal ideation.  Heme: Denies bruising, bleeding, and enlarged lymph nodes.  Physical Exam: BP 123/73 mmHg  Pulse 72  Temp(Src) 98.2 F (36.8 C) (Oral)  Ht 5\' 7"  (1.702 m)  Wt 227  lb 9.6 oz (103.239 kg)  BMI 35.64 kg/m2 General:   Alert and oriented. No distress noted. Pleasant and cooperative.  Head:  Normocephalic and atraumatic. Eyes:  Conjuctiva clear without scleral icterus. Mouth:  Oral mucosa pink and moist. No lesions. Neck:  Supple, without mass or thyromegaly. Lungs:  Clear to auscultation bilaterally. No wheezes, rales, or rhonchi. No distress.  Heart:  S1, S2 present without murmurs, rubs, or gallops. Regular rate and rhythm. Abdomen:  +BS, soft, non-tender and non-distended. No rebound or guarding. No HSM or masses noted. Extremities:  With mild non-[itting edema consistent with baseline (per patient). Neurologic:  Alert and  oriented x4;  grossly normal neurologically. Skin:  Intact without significant lesions or rashes. Psych:  Alert and cooperative. Normal mood and affect.    09/20/2014 10:03 AM

## 2014-09-20 NOTE — Patient Instructions (Signed)
1. We will check some labs for you today to monitor your progress. 2. Continue taking your medication as ordered 3. Notify us before starting any new medications including over the counter medication, prescription medications, or herbal medications. 4. Call if you have any problems. 5. Return for follow-up in 2 months (or sooner if any problems)

## 2014-09-21 NOTE — Assessment & Plan Note (Addendum)
74 year old male with a history of hepatitis C hepatitis current amount treatment with her bony has completed the first month of treatment and is doing well. Denies any side effects or adverse reactions. Today as part of routine monitoring of her bony antiviral treatment will order lab tests including CBC, BMP, HSP, HCV RNA. Return to clinic in 2 months for continued management or sooner if any problems. Reinforce education to patient to please notify us of any changes in medications including over-the-counter prescription herbal supplements. Instructed to notify our office of any new problems.

## 2014-09-21 NOTE — Progress Notes (Signed)
cc'ed to pcp °

## 2014-09-22 LAB — CBC WITH DIFFERENTIAL/PLATELET
BASOS ABS: 0 10*3/uL (ref 0.0–0.1)
Basophils Relative: 0 % (ref 0–1)
EOS PCT: 6 % — AB (ref 0–5)
Eosinophils Absolute: 0.5 10*3/uL (ref 0.0–0.7)
HEMATOCRIT: 32.3 % — AB (ref 39.0–52.0)
Hemoglobin: 10.3 g/dL — ABNORMAL LOW (ref 13.0–17.0)
Lymphocytes Relative: 19 % (ref 12–46)
Lymphs Abs: 1.5 10*3/uL (ref 0.7–4.0)
MCH: 26.3 pg (ref 26.0–34.0)
MCHC: 31.9 g/dL (ref 30.0–36.0)
MCV: 82.6 fL (ref 78.0–100.0)
MONO ABS: 0.6 10*3/uL (ref 0.1–1.0)
MPV: 10.2 fL (ref 8.6–12.4)
Monocytes Relative: 8 % (ref 3–12)
NEUTROS ABS: 5.3 10*3/uL (ref 1.7–7.7)
Neutrophils Relative %: 67 % (ref 43–77)
Platelets: 350 10*3/uL (ref 150–400)
RBC: 3.91 MIL/uL — ABNORMAL LOW (ref 4.22–5.81)
RDW: 14.8 % (ref 11.5–15.5)
WBC: 7.9 10*3/uL (ref 4.0–10.5)

## 2014-09-22 LAB — BASIC METABOLIC PANEL
BUN: 33 mg/dL — ABNORMAL HIGH (ref 6–23)
CALCIUM: 9.4 mg/dL (ref 8.4–10.5)
CO2: 26 mEq/L (ref 19–32)
Chloride: 102 mEq/L (ref 96–112)
Creat: 1.86 mg/dL — ABNORMAL HIGH (ref 0.50–1.35)
Glucose, Bld: 122 mg/dL — ABNORMAL HIGH (ref 70–99)
Potassium: 4.1 mEq/L (ref 3.5–5.3)
Sodium: 141 mEq/L (ref 135–145)

## 2014-09-22 LAB — HEPATIC FUNCTION PANEL
ALBUMIN: 3.9 g/dL (ref 3.5–5.2)
ALK PHOS: 131 U/L — AB (ref 39–117)
ALT: 9 U/L (ref 0–53)
AST: 15 U/L (ref 0–37)
BILIRUBIN DIRECT: 0.1 mg/dL (ref 0.0–0.3)
BILIRUBIN INDIRECT: 0.3 mg/dL (ref 0.2–1.2)
BILIRUBIN TOTAL: 0.4 mg/dL (ref 0.2–1.2)
TOTAL PROTEIN: 7.8 g/dL (ref 6.0–8.3)

## 2014-09-22 LAB — HEPATITIS C RNA QUANTITATIVE
HCV Quantitative Log: <1.18 {Log} (ref <1.18)
HCV Quantitative: <15 IU/mL (ref <15)

## 2014-11-01 ENCOUNTER — Ambulatory Visit (INDEPENDENT_AMBULATORY_CARE_PROVIDER_SITE_OTHER): Payer: Medicare Other | Admitting: Urology

## 2014-11-01 DIAGNOSIS — N3941 Urge incontinence: Secondary | ICD-10-CM | POA: Diagnosis not present

## 2014-11-01 DIAGNOSIS — C61 Malignant neoplasm of prostate: Secondary | ICD-10-CM | POA: Diagnosis not present

## 2014-11-01 DIAGNOSIS — R3915 Urgency of urination: Secondary | ICD-10-CM | POA: Diagnosis not present

## 2014-11-01 DIAGNOSIS — R32 Unspecified urinary incontinence: Secondary | ICD-10-CM | POA: Diagnosis not present

## 2014-11-01 DIAGNOSIS — N529 Male erectile dysfunction, unspecified: Secondary | ICD-10-CM

## 2014-11-21 ENCOUNTER — Encounter: Payer: Self-pay | Admitting: Gastroenterology

## 2014-11-21 ENCOUNTER — Ambulatory Visit (INDEPENDENT_AMBULATORY_CARE_PROVIDER_SITE_OTHER): Payer: Medicare Other | Admitting: Gastroenterology

## 2014-11-21 ENCOUNTER — Ambulatory Visit: Payer: Medicare Other | Admitting: Nurse Practitioner

## 2014-11-21 VITALS — BP 139/77 | HR 66 | Temp 98.3°F | Ht 67.0 in | Wt 236.2 lb

## 2014-11-21 DIAGNOSIS — B182 Chronic viral hepatitis C: Secondary | ICD-10-CM

## 2014-11-21 DIAGNOSIS — D649 Anemia, unspecified: Secondary | ICD-10-CM | POA: Diagnosis not present

## 2014-11-21 NOTE — Assessment & Plan Note (Signed)
Completed 3 month course of Harvoni. Negative HCV RNA after first four weeks of therapy. Clinically doing well. Limited fibrosis on u/s with elastography. No evidence of portal hypertension on EGD/TCS previously.   Will recheck HCV RNA first week of August (3 months post-completion of Harvoni) and at 6 months after treatment completion.   Will also recheck his CBC, CMET at that time as well to follow up on chronic renal insufficiency, elevated alkaline phosphatase and anemia.

## 2014-11-21 NOTE — Progress Notes (Signed)
Need CBC, CMET, HCV RNA quantitative, iron/tibc, ferritin in 10 weeks.   Dx: HCV, normocytic anemia, chronic renal insufficiency.

## 2014-11-21 NOTE — Patient Instructions (Signed)
1. Please collect ifobt (stool specimen) and return to our office. 2. We will send you a letter when it is time to repeat your blood work. 3. Congratulations on completing your Hepatitis C treatment!

## 2014-11-21 NOTE — Assessment & Plan Note (Signed)
Recheck CBC in August. Check I FOBT now. EGD and colonoscopy up-to-date as outlined above. If I FOBT is positive, may consider small bowel endoscopy. Suspect his anemia related to chronic renal insufficiency.

## 2014-11-21 NOTE — Progress Notes (Signed)
      Primary Care Physician: Glo Herring., MD  Primary Gastroenterologist:  Garfield Cornea, MD   Chief Complaint  Patient presents with  . OTHER    Followup    HPI: Colin [name] is a 74 y.o. male here for follow up. Completed HCV antiviral treatment with Harvoni for genotype 1, Metavir F0/F1. Complete treatment last week. H/o chronic renal insufficiency with stable creatinine during treatment.  Patient had no side effects to treatment. He feels well. Chronic lower extremity edema stable. No abdominal pain, heartburn, vomiting, melena, brbpr, constipation, diarrhea.   Last HCV RNA was <15 on 09/20/14.   Lab Results  Component Value Date   WBC 7.9 09/20/2014   HGB 10.3* 09/20/2014   HCT 32.3* 09/20/2014   MCV 82.6 09/20/2014   PLT 350 09/20/2014   Lab Results  Component Value Date   ALT 9 09/20/2014   AST 15 09/20/2014   ALKPHOS 131* 09/20/2014   BILITOT 0.4 09/20/2014   Lab Results  Component Value Date   CREATININE 1.86* 09/20/2014   BUN 33* 09/20/2014   NA 141 09/20/2014   K 4.1 09/20/2014   CL 102 09/20/2014   CO2 26 09/20/2014     Current Outpatient Prescriptions  Medication Sig Dispense Refill  . HYDROcodone-acetaminophen (LORCET HD) 10-325 MG per tablet Take 1 tablet by mouth every 6 (six) hours as needed for pain.    . Multiple Vitamins-Minerals (MULTIVITAMIN WITH MINERALS) tablet Take 1 tablet by mouth daily.      Marland Kitchen torsemide (DEMADEX) 20 MG tablet Take 3 tablets by mouth 3 (three) times daily.      No current facility-administered medications for this visit.    Allergies as of 11/21/2014  . (No Known Allergies)    ROS:  General: Negative for anorexia, weight loss, fever, chills, fatigue, weakness. ENT: Negative for hoarseness, difficulty swallowing , nasal congestion. CV: Negative for chest pain, angina, palpitations, dyspnea on exertion. 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  Respiratory: Negative for dyspnea at rest, dyspnea on  exertion, cough, sputum, wheezing.  GI: See history of present illness. GU:  Negative for dysuria, hematuria, urinary incontinence, urinary frequency, nocturnal urination.  Endo: Negative for unusual weight change.    Physical Examination:   BP 139/77 mmHg  Pulse 66  Temp(Src) 98.3 F (36.8 C) (Oral)  Ht 5\' 7"  (1.702 m)  Wt 236 lb 3.2 oz (107.14 kg)  BMI 36.99 kg/m2  General: Well-nourished, well-developed in no acute distress.  Eyes: No icterus. Mouth: Oropharyngeal mucosa moist and pink , no lesions erythema or exudate. Lungs: Clear to auscultation bilaterally.  Heart: Regular rate and rhythm, no murmurs rubs or gallops.  Abdomen: Bowel sounds are normal, nontender, nondistended, no hepatosplenomegaly or masses, no abdominal bruits or hernia , no rebound or guarding.   Extremities:right lower extremity 2+, left LLE 1+. No clubbing or deformities. Neuro: Alert and oriented x 4   Skin: Warm and dry, no jaundice.   Psych: Alert and cooperative, normal mood and affect.   Imaging Studies: No results found.

## 2014-11-22 ENCOUNTER — Ambulatory Visit (INDEPENDENT_AMBULATORY_CARE_PROVIDER_SITE_OTHER): Payer: Self-pay

## 2014-11-22 DIAGNOSIS — D649 Anemia, unspecified: Secondary | ICD-10-CM

## 2014-11-22 LAB — IFOBT (OCCULT BLOOD): IMMUNOLOGICAL FECAL OCCULT BLOOD TEST: NEGATIVE

## 2014-11-22 NOTE — Progress Notes (Signed)
Pt returned ifobt and it was negative.  

## 2014-11-23 ENCOUNTER — Other Ambulatory Visit: Payer: Self-pay | Admitting: Gastroenterology

## 2014-11-23 DIAGNOSIS — B182 Chronic viral hepatitis C: Secondary | ICD-10-CM

## 2014-11-23 DIAGNOSIS — D649 Anemia, unspecified: Secondary | ICD-10-CM

## 2014-11-23 NOTE — Progress Notes (Signed)
Lab order on file. 

## 2014-11-23 NOTE — Progress Notes (Signed)
cc'ed to pcp °

## 2014-11-23 NOTE — Progress Notes (Signed)
Quick Note:  ifobt negative.  Please let pt know.  Recheck labs in 10 weeks as planned. ______

## 2014-12-20 ENCOUNTER — Telehealth: Payer: Self-pay | Admitting: Internal Medicine

## 2014-12-20 NOTE — Telephone Encounter (Signed)
Pt is on the July recall to have abd U/S in one year

## 2014-12-20 NOTE — Telephone Encounter (Signed)
Letter in mail

## 2014-12-23 ENCOUNTER — Other Ambulatory Visit: Payer: Self-pay

## 2014-12-23 DIAGNOSIS — D649 Anemia, unspecified: Secondary | ICD-10-CM

## 2014-12-23 DIAGNOSIS — B182 Chronic viral hepatitis C: Secondary | ICD-10-CM

## 2015-01-19 DIAGNOSIS — R600 Localized edema: Secondary | ICD-10-CM | POA: Diagnosis not present

## 2015-01-19 DIAGNOSIS — Z1389 Encounter for screening for other disorder: Secondary | ICD-10-CM | POA: Diagnosis not present

## 2015-01-19 DIAGNOSIS — M1991 Primary osteoarthritis, unspecified site: Secondary | ICD-10-CM | POA: Diagnosis not present

## 2015-01-19 DIAGNOSIS — I872 Venous insufficiency (chronic) (peripheral): Secondary | ICD-10-CM | POA: Diagnosis not present

## 2015-01-19 DIAGNOSIS — I10 Essential (primary) hypertension: Secondary | ICD-10-CM | POA: Diagnosis not present

## 2015-01-19 DIAGNOSIS — E118 Type 2 diabetes mellitus with unspecified complications: Secondary | ICD-10-CM | POA: Diagnosis not present

## 2015-01-27 ENCOUNTER — Other Ambulatory Visit: Payer: Self-pay | Admitting: Gastroenterology

## 2015-01-27 DIAGNOSIS — D649 Anemia, unspecified: Secondary | ICD-10-CM | POA: Diagnosis not present

## 2015-01-27 DIAGNOSIS — B182 Chronic viral hepatitis C: Secondary | ICD-10-CM | POA: Diagnosis not present

## 2015-01-28 ENCOUNTER — Other Ambulatory Visit (INDEPENDENT_AMBULATORY_CARE_PROVIDER_SITE_OTHER): Payer: Self-pay | Admitting: Internal Medicine

## 2015-01-28 ENCOUNTER — Telehealth (INDEPENDENT_AMBULATORY_CARE_PROVIDER_SITE_OTHER): Payer: Self-pay | Admitting: Internal Medicine

## 2015-01-28 LAB — COMPREHENSIVE METABOLIC PANEL
ALBUMIN: 4.7 g/dL (ref 3.5–5.2)
ALK PHOS: 159 U/L — AB (ref 39–117)
ALT: 29 U/L (ref 0–53)
AST: 25 U/L (ref 0–37)
BUN: 108 mg/dL — ABNORMAL HIGH (ref 6–23)
CO2: 31 mEq/L (ref 19–32)
Calcium: 9.6 mg/dL (ref 8.4–10.5)
Chloride: 82 mEq/L — ABNORMAL LOW (ref 96–112)
Creat: 2.66 mg/dL — ABNORMAL HIGH (ref 0.50–1.35)
Glucose, Bld: 209 mg/dL — ABNORMAL HIGH (ref 70–99)
Potassium: 2.6 mEq/L — CL (ref 3.5–5.3)
SODIUM: 131 meq/L — AB (ref 135–145)
TOTAL PROTEIN: 8.7 g/dL — AB (ref 6.0–8.3)
Total Bilirubin: 1 mg/dL (ref 0.2–1.2)

## 2015-01-28 LAB — IRON AND TIBC
%SAT: 19 % — ABNORMAL LOW (ref 20–55)
Iron: 71 ug/dL (ref 42–165)
TIBC: 379 ug/dL (ref 215–435)
UIBC: 308 ug/dL (ref 125–400)

## 2015-01-28 LAB — CBC WITH DIFFERENTIAL/PLATELET
BASOS ABS: 0 10*3/uL (ref 0.0–0.1)
BASOS PCT: 0 % (ref 0–1)
Eosinophils Absolute: 0.1 10*3/uL (ref 0.0–0.7)
Eosinophils Relative: 1 % (ref 0–5)
HCT: 38.2 % — ABNORMAL LOW (ref 39.0–52.0)
Hemoglobin: 12.9 g/dL — ABNORMAL LOW (ref 13.0–17.0)
LYMPHS PCT: 12 % (ref 12–46)
Lymphs Abs: 1.5 10*3/uL (ref 0.7–4.0)
MCH: 26.5 pg (ref 26.0–34.0)
MCHC: 33.8 g/dL (ref 30.0–36.0)
MCV: 78.4 fL (ref 78.0–100.0)
MONO ABS: 0.9 10*3/uL (ref 0.1–1.0)
MPV: 9.6 fL (ref 8.6–12.4)
Monocytes Relative: 7 % (ref 3–12)
NEUTROS ABS: 9.8 10*3/uL — AB (ref 1.7–7.7)
NEUTROS PCT: 80 % — AB (ref 43–77)
Platelets: 354 10*3/uL (ref 150–400)
RBC: 4.87 MIL/uL (ref 4.22–5.81)
RDW: 15.7 % — AB (ref 11.5–15.5)
WBC: 12.3 10*3/uL — AB (ref 4.0–10.5)

## 2015-01-28 LAB — MAGNESIUM: Magnesium: 3.5 mg/dL — ABNORMAL HIGH (ref 1.5–2.5)

## 2015-01-28 LAB — FERRITIN: Ferritin: 103 ng/mL (ref 22–322)

## 2015-01-28 MED ORDER — POTASSIUM CHLORIDE CRYS ER 20 MEQ PO TBCR: EXTENDED_RELEASE_TABLET | ORAL | Status: DC

## 2015-01-28 NOTE — Telephone Encounter (Signed)
Lab called to let me know patient's potassium was 2.6. Asian contacted immediately. He is on torsemide but no potassium supplement. Patient reports no symptoms such as weakness. He declined to come to emergency room but agreed to take by mouth potassium. Tamara at lab asked to check serum magnesium. Prescription for KCl sent to his pharmacy as follows 40 mg by mouth twice a day for 2 days and 40 mg by mouth daily. Patient will need repeat abs studies repeated next week to be done by Ms. Neil Crouch PAC.

## 2015-01-30 DIAGNOSIS — E1129 Type 2 diabetes mellitus with other diabetic kidney complication: Secondary | ICD-10-CM | POA: Diagnosis not present

## 2015-01-30 DIAGNOSIS — Z6834 Body mass index (BMI) 34.0-34.9, adult: Secondary | ICD-10-CM | POA: Diagnosis not present

## 2015-01-30 DIAGNOSIS — E6609 Other obesity due to excess calories: Secondary | ICD-10-CM | POA: Diagnosis not present

## 2015-01-30 DIAGNOSIS — Z1389 Encounter for screening for other disorder: Secondary | ICD-10-CM | POA: Diagnosis not present

## 2015-01-30 DIAGNOSIS — I10 Essential (primary) hypertension: Secondary | ICD-10-CM | POA: Diagnosis not present

## 2015-01-30 DIAGNOSIS — R600 Localized edema: Secondary | ICD-10-CM | POA: Diagnosis not present

## 2015-01-30 DIAGNOSIS — R609 Edema, unspecified: Secondary | ICD-10-CM | POA: Diagnosis not present

## 2015-01-30 LAB — HEPATITIS C RNA QUANTITATIVE: HCV Quantitative: NOT DETECTED IU/mL (ref <15)

## 2015-01-30 NOTE — Progress Notes (Signed)
Quick Note:  I called and informed pt that he should see PCP today, that I would call PCP and let them know and they will call him for appt today. I called [facility] and spoke to [place] who is taking care of making the appt for pt today. I am faxing the labs to [phone]. ______

## 2015-01-30 NOTE — Progress Notes (Signed)
Quick Note:  Pt is aware PCP will call for appt today. ______

## 2015-01-30 NOTE — Progress Notes (Signed)
Quick Note:  See result note for CMET.  Patient needs to follow up with PCP regarding worsening renal failure and electrolyte abnormalities.   We will follow up with patient when pending HCV results are back. ______

## 2015-01-30 NOTE — Telephone Encounter (Signed)
Patient to see PCP today about electrolyte abnormalities and worsening renal insufficiency. See result note.

## 2015-01-30 NOTE — Progress Notes (Signed)
Quick Note:  Please call patient first thing this morning.  He needs to see his PCP today about his electrolyte abnormalities and worsening renal function TODAY. -His potassium is low, his magnesium is high, creatinine from 1.86 to 2.66.  Please send copy of labs (CMET and magnesium) to his PCP and make contact with PCP nurse regarding our request for them to manage issue. ______

## 2015-01-30 NOTE — Progress Notes (Signed)
Quick Note:  Thanks. ______

## 2015-01-30 NOTE — Progress Notes (Signed)
Quick Note:  Please let patient know his HCV is still negative. His H/H was improved.   Needs HCV RNA quantitative in 3 months.  Needs LFTs, CBC, ferritin in 3 months.  Complete ifobt now. Let's get copy of PCP note from today when available. ______

## 2015-01-31 ENCOUNTER — Other Ambulatory Visit: Payer: Self-pay

## 2015-01-31 DIAGNOSIS — D649 Anemia, unspecified: Secondary | ICD-10-CM

## 2015-01-31 DIAGNOSIS — B182 Chronic viral hepatitis C: Secondary | ICD-10-CM

## 2015-01-31 NOTE — Progress Notes (Signed)
Quick Note:  PT is aware of results. He will be by today or tomorrow to pick up iFOBT and I will explain how to do it. I called [facility] Pickett and spoke to [place] who said the computers are down, but as soon as they are up she will fax the office visit notes from yesterday. ______

## 2015-02-08 DIAGNOSIS — Z1389 Encounter for screening for other disorder: Secondary | ICD-10-CM | POA: Diagnosis not present

## 2015-02-27 DIAGNOSIS — Z6833 Body mass index (BMI) 33.0-33.9, adult: Secondary | ICD-10-CM | POA: Diagnosis not present

## 2015-02-27 DIAGNOSIS — E1129 Type 2 diabetes mellitus with other diabetic kidney complication: Secondary | ICD-10-CM | POA: Diagnosis not present

## 2015-02-27 DIAGNOSIS — Z0000 Encounter for general adult medical examination without abnormal findings: Secondary | ICD-10-CM | POA: Diagnosis not present

## 2015-02-27 DIAGNOSIS — R5383 Other fatigue: Secondary | ICD-10-CM | POA: Diagnosis not present

## 2015-02-27 DIAGNOSIS — E6609 Other obesity due to excess calories: Secondary | ICD-10-CM | POA: Diagnosis not present

## 2015-02-27 DIAGNOSIS — Z1389 Encounter for screening for other disorder: Secondary | ICD-10-CM | POA: Diagnosis not present

## 2015-02-27 DIAGNOSIS — E119 Type 2 diabetes mellitus without complications: Secondary | ICD-10-CM | POA: Diagnosis not present

## 2015-03-17 ENCOUNTER — Other Ambulatory Visit: Payer: Self-pay

## 2015-03-17 DIAGNOSIS — B182 Chronic viral hepatitis C: Secondary | ICD-10-CM

## 2015-03-17 DIAGNOSIS — D649 Anemia, unspecified: Secondary | ICD-10-CM

## 2015-03-23 DIAGNOSIS — E871 Hypo-osmolality and hyponatremia: Secondary | ICD-10-CM | POA: Diagnosis not present

## 2015-03-23 DIAGNOSIS — G894 Chronic pain syndrome: Secondary | ICD-10-CM | POA: Diagnosis not present

## 2015-03-23 DIAGNOSIS — R600 Localized edema: Secondary | ICD-10-CM | POA: Diagnosis not present

## 2015-03-23 DIAGNOSIS — Z6835 Body mass index (BMI) 35.0-35.9, adult: Secondary | ICD-10-CM | POA: Diagnosis not present

## 2015-03-23 DIAGNOSIS — Z1389 Encounter for screening for other disorder: Secondary | ICD-10-CM | POA: Diagnosis not present

## 2015-03-23 DIAGNOSIS — E6609 Other obesity due to excess calories: Secondary | ICD-10-CM | POA: Diagnosis not present

## 2015-05-02 ENCOUNTER — Ambulatory Visit: Payer: Self-pay | Admitting: Urology

## 2015-07-19 DIAGNOSIS — E6609 Other obesity due to excess calories: Secondary | ICD-10-CM | POA: Diagnosis not present

## 2015-07-19 DIAGNOSIS — I739 Peripheral vascular disease, unspecified: Secondary | ICD-10-CM | POA: Diagnosis not present

## 2015-07-19 DIAGNOSIS — G894 Chronic pain syndrome: Secondary | ICD-10-CM | POA: Diagnosis not present

## 2015-07-19 DIAGNOSIS — Z23 Encounter for immunization: Secondary | ICD-10-CM | POA: Diagnosis not present

## 2015-07-19 DIAGNOSIS — I872 Venous insufficiency (chronic) (peripheral): Secondary | ICD-10-CM | POA: Diagnosis not present

## 2015-07-19 DIAGNOSIS — Z1389 Encounter for screening for other disorder: Secondary | ICD-10-CM | POA: Diagnosis not present

## 2015-08-19 NOTE — Progress Notes (Signed)
REVIEWED-NO ADDITIONAL RECOMMENDATIONS. 

## 2015-09-05 ENCOUNTER — Ambulatory Visit (INDEPENDENT_AMBULATORY_CARE_PROVIDER_SITE_OTHER): Payer: Medicare Other | Admitting: Urology

## 2015-09-05 DIAGNOSIS — C61 Malignant neoplasm of prostate: Secondary | ICD-10-CM

## 2015-09-05 DIAGNOSIS — R3915 Urgency of urination: Secondary | ICD-10-CM | POA: Diagnosis not present

## 2015-09-12 ENCOUNTER — Other Ambulatory Visit: Payer: Self-pay | Admitting: Urology

## 2015-09-12 DIAGNOSIS — C61 Malignant neoplasm of prostate: Secondary | ICD-10-CM

## 2015-09-19 ENCOUNTER — Ambulatory Visit (HOSPITAL_COMMUNITY)
Admission: RE | Admit: 2015-09-19 | Discharge: 2015-09-19 | Disposition: A | Discharge status: Home / Self Care | Payer: Medicare Other | Source: Ambulatory Visit | Attending: Urology | Admitting: Urology

## 2015-09-19 ENCOUNTER — Encounter (HOSPITAL_COMMUNITY): Payer: Self-pay

## 2015-09-19 DIAGNOSIS — C61 Malignant neoplasm of prostate: Secondary | ICD-10-CM | POA: Insufficient documentation

## 2015-09-19 MED ORDER — GENTAMICIN SULFATE 40 MG/ML IJ SOLN
160.0000 mg | Freq: Once | INTRAMUSCULAR | Status: AC
  Administered 2015-09-19: 160 mg via INTRAMUSCULAR

## 2015-09-19 MED ORDER — GENTAMICIN SULFATE 40 MG/ML IJ SOLN
INTRAMUSCULAR | Status: AC
  Administered 2015-09-19: 160 mg via INTRAMUSCULAR
  Filled 2015-09-19: qty 4

## 2015-09-19 MED ORDER — LIDOCAINE HCL (PF) 2 % IJ SOLN
10.0000 mL | Freq: Once | INTRAMUSCULAR | Status: AC
  Administered 2015-09-19: 10 mL

## 2015-09-19 MED ORDER — LIDOCAINE HCL (PF) 2 % IJ SOLN
INTRAMUSCULAR | Status: AC
  Administered 2015-09-19: 10 mL
  Filled 2015-09-19: qty 10

## 2015-09-19 NOTE — Discharge Instructions (Signed)
Transrectal Ultrasound-Guided Biopsy °A transrectal ultrasound-guided biopsy is a procedure to take samples of tissue from your prostate. Ultrasound images are used to guide the procedure. It is usually done to check the prostate gland for cancer. °BEFORE THE PROCEDURE °· Do not eat or drink after midnight on the night before your procedure. °· Take medicines as your doctor tells you. °· Your doctor may have you stop taking some medicines 5-7 days before the procedure. °· You will be given an enema before your procedure. During an enema, a liquid is put into your butt (rectum) to clear out waste. °· You may have lab tests the day of your procedure. °· Make plans to have someone drive you home. °PROCEDURE °· You will be given medicine to help you relax before the procedure. An IV tube will be put into one of your veins. It will be used to give fluids and medicine. °· You will be given medicine to reduce the risk of infection (antibiotic). °· You will be placed on your side. °· A probe with gel will be put in your butt. This is used to take pictures of your prostate and the area around it. °· A medicine to numb the area is put into your prostate. °· A biopsy needle is then inserted and guided to your prostate. °· Samples of prostate tissue are taken. The needle is removed. °· The samples are sent to a lab to be checked. Results are usually back in 2-3 days. °AFTER THE PROCEDURE °· You will be taken to a room where you will be watched until you are doing okay. °· You may have some pain in the area around your butt. You will be given medicines for this. °· You may be able to go home the same day. Sometimes, an overnight stay in the hospital is needed. °  °This information is not intended to replace advice given to you by your health care provider. Make sure you discuss any questions you have with your health care provider. °  °Document Released: 06/12/2009 Document Revised: 06/29/2013 Document Reviewed:  02/10/2013 °Elsevier Interactive Patient Education ©2016 [employer] ° °

## 2015-10-02 ENCOUNTER — Other Ambulatory Visit: Payer: Self-pay | Admitting: Urology

## 2015-10-02 DIAGNOSIS — C61 Malignant neoplasm of prostate: Secondary | ICD-10-CM

## 2015-10-10 ENCOUNTER — Encounter (HOSPITAL_COMMUNITY)
Admission: RE | Admit: 2015-10-10 | Discharge: 2015-10-10 | Disposition: A | Discharge status: Home / Self Care | Payer: Medicare Other | Source: Ambulatory Visit | Attending: Urology | Admitting: Urology

## 2015-10-10 ENCOUNTER — Encounter (HOSPITAL_COMMUNITY): Payer: Self-pay

## 2015-10-10 DIAGNOSIS — C61 Malignant neoplasm of prostate: Secondary | ICD-10-CM

## 2015-10-10 MED ORDER — TECHNETIUM TC 99M MEDRONATE IV KIT
25.0000 | PACK | Freq: Once | INTRAVENOUS | Status: AC | PRN
  Administered 2015-10-10: 24.8 via INTRAVENOUS

## 2015-10-13 ENCOUNTER — Ambulatory Visit (HOSPITAL_COMMUNITY)
Admission: RE | Admit: 2015-10-13 | Discharge: 2015-10-13 | Disposition: A | Discharge status: Home / Self Care | Payer: Medicare Other | Source: Ambulatory Visit | Attending: Urology | Admitting: Urology

## 2015-10-13 DIAGNOSIS — C61 Malignant neoplasm of prostate: Secondary | ICD-10-CM | POA: Insufficient documentation

## 2015-10-13 DIAGNOSIS — K838 Other specified diseases of biliary tract: Secondary | ICD-10-CM | POA: Diagnosis not present

## 2015-10-13 DIAGNOSIS — K8020 Calculus of gallbladder without cholecystitis without obstruction: Secondary | ICD-10-CM | POA: Diagnosis not present

## 2015-10-13 LAB — POCT I-STAT CREATININE: Creatinine, Ser: 1.9 mg/dL — ABNORMAL HIGH (ref 0.61–1.24)

## 2015-10-13 MED ORDER — IOPAMIDOL (ISOVUE-300) INJECTION 61%
80.0000 mL | Freq: Once | INTRAVENOUS | Status: AC | PRN
  Administered 2015-10-13: 80 mL via INTRAVENOUS

## 2015-10-18 DIAGNOSIS — M1991 Primary osteoarthritis, unspecified site: Secondary | ICD-10-CM | POA: Diagnosis not present

## 2015-10-18 DIAGNOSIS — G894 Chronic pain syndrome: Secondary | ICD-10-CM | POA: Diagnosis not present

## 2015-10-18 DIAGNOSIS — I10 Essential (primary) hypertension: Secondary | ICD-10-CM | POA: Diagnosis not present

## 2015-10-18 DIAGNOSIS — E1129 Type 2 diabetes mellitus with other diabetic kidney complication: Secondary | ICD-10-CM | POA: Diagnosis not present

## 2015-10-30 ENCOUNTER — Telehealth: Payer: Self-pay | Admitting: Gastroenterology

## 2015-10-30 ENCOUNTER — Encounter: Payer: Self-pay | Admitting: Internal Medicine

## 2015-10-30 NOTE — Telephone Encounter (Signed)
APPT MADE AND LETTER SENT  °

## 2015-10-30 NOTE — Telephone Encounter (Signed)
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

## 2015-10-30 NOTE — Telephone Encounter (Signed)
Stacey, please schedule pt. 

## 2015-12-21 ENCOUNTER — Other Ambulatory Visit: Payer: Self-pay | Admitting: Urology

## 2015-12-21 DIAGNOSIS — C61 Malignant neoplasm of prostate: Secondary | ICD-10-CM

## 2016-01-01 ENCOUNTER — Ambulatory Visit (HOSPITAL_COMMUNITY)
Admission: RE | Admit: 2016-01-01 | Discharge: 2016-01-01 | Disposition: A | Discharge status: Home / Self Care | Payer: Medicare Other | Source: Ambulatory Visit | Attending: Urology | Admitting: Urology

## 2016-01-01 ENCOUNTER — Other Ambulatory Visit: Payer: Self-pay | Admitting: Urology

## 2016-01-01 DIAGNOSIS — C61 Malignant neoplasm of prostate: Secondary | ICD-10-CM

## 2016-01-01 LAB — POCT I-STAT CREATININE: CREATININE: 1.9 mg/dL — AB (ref 0.61–1.24)

## 2016-01-23 DIAGNOSIS — Z1389 Encounter for screening for other disorder: Secondary | ICD-10-CM | POA: Diagnosis not present

## 2016-01-23 DIAGNOSIS — G894 Chronic pain syndrome: Secondary | ICD-10-CM | POA: Diagnosis not present

## 2016-01-23 DIAGNOSIS — I8311 Varicose veins of right lower extremity with inflammation: Secondary | ICD-10-CM | POA: Diagnosis not present

## 2016-01-23 DIAGNOSIS — R600 Localized edema: Secondary | ICD-10-CM | POA: Diagnosis not present

## 2016-01-29 ENCOUNTER — Encounter: Payer: Self-pay | Admitting: Gastroenterology

## 2016-01-29 ENCOUNTER — Encounter (INDEPENDENT_AMBULATORY_CARE_PROVIDER_SITE_OTHER): Payer: Self-pay

## 2016-01-29 ENCOUNTER — Ambulatory Visit (INDEPENDENT_AMBULATORY_CARE_PROVIDER_SITE_OTHER): Payer: Medicare Other | Admitting: Gastroenterology

## 2016-01-29 VITALS — BP 130/80 | HR 86 | Temp 99.0°F | Ht 67.0 in | Wt 228.0 lb

## 2016-01-29 DIAGNOSIS — D649 Anemia, unspecified: Secondary | ICD-10-CM | POA: Diagnosis not present

## 2016-01-29 DIAGNOSIS — K838 Other specified diseases of biliary tract: Secondary | ICD-10-CM

## 2016-01-29 DIAGNOSIS — B1920 Unspecified viral hepatitis C without hepatic coma: Secondary | ICD-10-CM | POA: Diagnosis not present

## 2016-01-29 LAB — IRON AND TIBC
%SAT: 13 % — ABNORMAL LOW (ref 15–60)
IRON: 37 ug/dL — AB (ref 50–180)
TIBC: 295 ug/dL (ref 250–425)
UIBC: 258 ug/dL (ref 125–400)

## 2016-01-29 LAB — CBC WITH DIFFERENTIAL/PLATELET
BASOS ABS: 0 {cells}/uL (ref 0–200)
BASOS PCT: 0 %
EOS PCT: 2 %
Eosinophils Absolute: 204 cells/uL (ref 15–500)
HCT: 39.1 % (ref 38.5–50.0)
Hemoglobin: 13 g/dL — ABNORMAL LOW (ref 13.2–17.1)
LYMPHS PCT: 15 %
Lymphs Abs: 1530 cells/uL (ref 850–3900)
MCH: 28.6 pg (ref 27.0–33.0)
MCHC: 33.2 g/dL (ref 32.0–36.0)
MCV: 85.9 fL (ref 80.0–100.0)
MONOS PCT: 9 %
MPV: 11 fL (ref 7.5–12.5)
Monocytes Absolute: 918 cells/uL (ref 200–950)
NEUTROS PCT: 74 %
Neutro Abs: 7548 cells/uL (ref 1500–7800)
PLATELETS: 258 10*3/uL (ref 140–400)
RBC: 4.55 MIL/uL (ref 4.20–5.80)
RDW: 15.7 % — AB (ref 11.0–15.0)
WBC: 10.2 10*3/uL (ref 3.8–10.8)

## 2016-01-29 LAB — HEPATIC FUNCTION PANEL
ALT: 29 U/L (ref 9–46)
AST: 27 U/L (ref 10–35)
Albumin: 4.2 g/dL (ref 3.6–5.1)
Alkaline Phosphatase: 116 U/L — ABNORMAL HIGH (ref 40–115)
BILIRUBIN DIRECT: 0.2 mg/dL (ref <=0.2)
BILIRUBIN INDIRECT: 0.5 mg/dL (ref 0.2–1.2)
TOTAL PROTEIN: 7 g/dL (ref 6.1–8.1)
Total Bilirubin: 0.7 mg/dL (ref 0.2–1.2)

## 2016-01-29 NOTE — Patient Instructions (Signed)
1. Please have your labs done. We will contact you with results and further recommendations.

## 2016-01-29 NOTE — Assessment & Plan Note (Signed)
Patient with history of chronic hepatitis C, limited fibrosis on elastography. No evidence of cirrhosis. Recent CT done for prostate issues by his urologist indicated mild intrahepatic biliary dilation, significance uncertain. He has gallstones. No extrahepatic biliary dilation. Pancreas appeared to be normal. No evidence of cirrhosis.  Update labs at this time including LFTs and final HCV RNA. If LFTs are abnormal, would consider further imaging of the biliary tract. Otherwise we'll address with Dr. Gala Romney.

## 2016-01-29 NOTE — Progress Notes (Signed)
Primary Care Physician: Glo Herring., MD  Primary Gastroenterologist:  Garfield Cornea, MD   Chief Complaint  Patient presents with  . Follow-up    Doing well    HPI: Colin [name] is a 75 y.o. male here for follow-up of anemia, status post HCV treatment. 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 History of chronic renal insufficiency was stable during his treatment. Patient has a history of normocytic anemia, EGD and colonoscopy up-to-date. I FOBT May 2016 was negative.  Since we last saw the patient he had a CT of the abdomen pelvis with contrast by his urologist. Gallstones noted, mildly distended gallbladder. Borderline intrahepatic biliary dilation without extrahepatic biliary dilation. No hepatic mass Or cirrhosis.  Patient states he has had prostate cancer in the past. Recently found prostate cancer, question new or recurrent, patient not sure. Workup still in process.  From a GI standpoint, patient is feeling well. Denies any constipation, diarrhea, melena, rectal bleeding, abdominal pain. Appetite is good. No heartburn, vomiting.   Current Outpatient Prescriptions  Medication Sig Dispense Refill  . HYDROcodone-acetaminophen (LORCET HD) 10-325 MG per tablet Take 1 tablet by mouth every 6 (six) hours as needed for pain.    . Multiple Vitamins-Minerals (MULTIVITAMIN WITH MINERALS) tablet Take 1 tablet by mouth daily.      Marland Kitchen torsemide (DEMADEX) 20 MG tablet Take 3 tablets by mouth 3 (three) times daily.      No current facility-administered medications for this visit.     Allergies as of 01/29/2016  . (No Known Allergies)    ROS:  General: Negative for anorexia, weight loss, fever, chills, fatigue, weakness. ENT: Negative for hoarseness, difficulty swallowing , nasal congestion. CV: Negative for chest pain, angina, palpitations, dyspnea on exertion, peripheral edema.  Respiratory: Negative for dyspnea  at rest, dyspnea on exertion, cough, sputum, wheezing.  GI: See history of present illness. GU:  Negative for dysuria, hematuria, urinary incontinence, urinary frequency, nocturnal urination.  Endo: Negative for unusual weight change.    Physical Examination:   BP 130/80   Pulse 86   Temp 99 F (37.2 C) (Oral)   Ht 5\' 7"  (1.702 m)   Wt 228 lb (103.4 kg)   BMI 35.71 kg/m   General: Well-nourished, well-developed in no acute distress.  Eyes: No icterus. Mouth: Oropharyngeal mucosa moist and pink , no lesions erythema or exudate. Lungs: Clear to auscultation bilaterally.  Heart: Regular rate and rhythm, no murmurs rubs or gallops.  Abdomen: Bowel sounds are normal, nontender, nondistended, no hepatosplenomegaly or masses, no abdominal bruits or hernia , no rebound or guarding.   Extremities: No lower extremity edema. No clubbing or deformities. Neuro: Alert and oriented x 4   Skin: Warm and dry, no jaundice.   Psych: Alert and cooperative, normal mood and affect.  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 Previous radiation therapy. EXAM: MR PROSTATE WITHOUT CONTRAST TECHNIQUE: Multiplanar multisequence MRI images were obtained of the pelvis centered about the prostate, without contrast administration. Patient requested to stop exam before intravenous contrast administration/dynamic imaging could be performed. COMPARISON:  CT on 10/13/2015 FINDINGS: Exam is technically suboptimal due to patient motion artifact and lack of dynamic contrast enhanced imaging. Prostate: Small size. Indistinct margins between peripheral zone and central gland seen, consistent with previous radiation therapy. Artifact from several fiducial markers seen within the peripheral  zone bilaterally, as demonstrated on recent CT. There is a 7 x 9 mm T2 hypointense peripheral zone nodule seen in the anterior apex near the midline on  image 17/series 11. This is located just anterior to the prostatic urethra. This shows low ADC signal. This is suspicious for high-grade carcinoma. Transcapsular spread:  Absent Seminal vesicle involvement: Absent. Low T2 signal fluid throughout left seminal vesicle consistent with hemorrhagic or proteinaceous fluid. Neurovascular bundle involvement: Absent Pelvic adenopathy: Absent Bone metastasis: Absent. Left hip osteoarthritis and lower lumbar spine degenerative spondylosis noted. Other findings: None IMPRESSION: 9 mm peripheral zone nodule in the anterior apex near the midline and adjacent to the prostatic urethra, suspicious for focus of recurrent high-grade carcinoma. No definite extracapsular extension or other signs of metastatic disease. Electronically Signed   By: Earle Gell M.D.   On: 01/01/2016 15:00    CT Abdomen Pelvis W Contrast (Accession [id number]) (Order [id number])  Imaging  Date: 10/13/2015 Department: [facility] Released By: Brett Fairy Authorizing: Franchot Gallo, MD  PACS Images   Show images for CT Abdomen Pelvis W Contrast  Study Result   CLINICAL DATA:  Recurrent prostate adenocarcinoma. hepatitis c. Hypertension. Chronic kidney disease.  EXAM: CT ABDOMEN AND PELVIS WITH CONTRAST  TECHNIQUE: Multidetector CT imaging of the abdomen and pelvis was performed using the standard protocol following bolus administration of intravenous contrast.  CONTRAST:  46mL ISOVUE-300 IOPAMIDOL (ISOVUE-300) INJECTION 61%  COMPARISON:  Multiple exams, including 10/13/2015  FINDINGS: Lower chest: Circumflex coronary artery atherosclerotic calcification.  Hepatobiliary: Gallstones are observed including a 10 mm in long axis calcified gallstone. Mildly distended gallbladder at 5.1 cm transverse. Borderline intrahepatic biliary dilatation without extrahepatic biliary dilatation. No definite hepatic hilar mass is readily apparent.  Pancreas:  Unremarkable  Spleen: Unremarkable  Adrenals/Urinary Tract: Fluid density 1.7 by 1.1 cm lesion of the right kidney lower pole anteriorly, similar size to 2014, no significant change in density between portal venous and delayed phase images. No urinary tract calculi are identified. Urinary bladder unremarkable.  Stomach/Bowel: Unremarkable  Vascular/Lymphatic: Aortoiliac atherosclerotic vascular disease. Borderline enlarged right inguinal lymph node at 1.5 cm but with a fatty hilum. Bilateral external iliac nodes likewise have fatty hila.  Reproductive: Several fiducials along the soft tissue margins of the somewhat small residual prostate tissue, with faint calcifications in the prostate.  Other: No supplemental non-categorized findings.  Musculoskeletal: Severe degenerative arthropathy of the left hip with moderate degenerative arthropathy of the right hip. Notable lumbar spondylosis and degenerative disc disease with multilevel impingement. Prominent degenerative spurring along the costovertebral margins in the lower thoracic spine may account for some of the high activity in this vicinity on prior bone scan. I do not see definite compelling evidence of osseous metastatic disease. Healed fracture of the right lateral tenth rib. Healed fracture of the left anterior fifth rib.  IMPRESSION: 1. Borderline enlarged right inguinal lymph node has a fatty hilum, and could be reactive or less likely neoplastic. Degenerative findings in the lower thoracic spine, lumbar spine, and hips. 2. Mild intrahepatic biliary dilatation, significance uncertain. There is no extrahepatic biliary dilatation. If the patient has abnormal bilirubin levels, then further workup of the biliary tree using MRCP or ERCP may be warranted. 3. Other imaging findings of potential clinical significance: Cholelithiasis. Coronary atherosclerosis. Aortoiliac atherosclerotic vascular  disease.   Electronically Signed   By: Van Clines M.D.   On: 10/13/2015 13:01

## 2016-01-29 NOTE — Progress Notes (Signed)
cc'ed to pcp °

## 2016-01-29 NOTE — Assessment & Plan Note (Signed)
Update labs at this time. Suspect anemia related to chronic renal insufficiency.

## 2016-01-30 LAB — FERRITIN: Ferritin: 75 ng/mL (ref 20–380)

## 2016-01-31 LAB — HEPATITIS C RNA QUANTITATIVE: HCV Quantitative: NOT DETECTED IU/mL (ref <15)

## 2016-02-01 NOTE — Progress Notes (Signed)
Please let patient know his HCV is negative. No further work up or testing for HCV. He had limited fibrosis. No anemia, no iron def.  I am going to discuss CT findings from 10/2015 (ordered by his urologist) with RMR once he returns. He had mild intrahepatic biliary dilation, his LFTs are fine. I don't think this is significant but just want to cover the bases.

## 2016-02-06 DIAGNOSIS — E119 Type 2 diabetes mellitus without complications: Secondary | ICD-10-CM | POA: Diagnosis not present

## 2016-03-05 ENCOUNTER — Ambulatory Visit (INDEPENDENT_AMBULATORY_CARE_PROVIDER_SITE_OTHER): Payer: Medicare Other | Admitting: Urology

## 2016-03-05 DIAGNOSIS — C61 Malignant neoplasm of prostate: Secondary | ICD-10-CM

## 2016-04-10 DIAGNOSIS — C61 Malignant neoplasm of prostate: Secondary | ICD-10-CM | POA: Diagnosis not present

## 2016-04-12 ENCOUNTER — Other Ambulatory Visit: Payer: Self-pay | Admitting: Urology

## 2016-05-08 DIAGNOSIS — Z0000 Encounter for general adult medical examination without abnormal findings: Secondary | ICD-10-CM | POA: Diagnosis not present

## 2016-05-08 DIAGNOSIS — C61 Malignant neoplasm of prostate: Secondary | ICD-10-CM | POA: Diagnosis not present

## 2016-05-08 DIAGNOSIS — E119 Type 2 diabetes mellitus without complications: Secondary | ICD-10-CM | POA: Diagnosis not present

## 2016-05-08 DIAGNOSIS — L0100 Impetigo, unspecified: Secondary | ICD-10-CM | POA: Diagnosis not present

## 2016-05-08 DIAGNOSIS — Z23 Encounter for immunization: Secondary | ICD-10-CM | POA: Diagnosis not present

## 2016-05-20 ENCOUNTER — Encounter (HOSPITAL_BASED_OUTPATIENT_CLINIC_OR_DEPARTMENT_OTHER): Payer: Self-pay | Admitting: *Deleted

## 2016-05-20 NOTE — Progress Notes (Signed)
NPO AFTER MN.  ARRIVE AT 0600.  NEEDS ISTAT 8 AND EKG.  MAY TAKE PAIN MED IF NEEDED AM DOS W/ SIPS OF WATER.

## 2016-05-23 NOTE — Anesthesia Preprocedure Evaluation (Addendum)
Anesthesia Evaluation  Patient identified by MRN, date of birth, ID band Patient awake    Reviewed: Allergy & Precautions, NPO status , Patient's Chart, lab work & pertinent test results  Airway Mallampati: II  TM Distance: >3 FB Neck ROM: Full    Dental  (+) Dental Advisory Given   Pulmonary former smoker,    breath sounds clear to auscultation       Cardiovascular hypertension, Pt. on medications + Peripheral Vascular Disease   Rhythm:Regular Rate:Normal     Neuro/Psych negative neurological ROS     GI/Hepatic negative GI ROS, (+) Hepatitis -, C  Endo/Other  diabetes, Type 2  Renal/GU CRFRenal disease     Musculoskeletal   Abdominal   Peds  Hematology negative hematology ROS (+)   Anesthesia Other Findings   Reproductive/Obstetrics                            Lab Results  Component Value Date   WBC 10.2 01/29/2016   HGB 13.6 05/24/2016   HCT 40.0 05/24/2016   MCV 85.9 01/29/2016   PLT 258 01/29/2016   Lab Results  Component Value Date   CREATININE 1.70 (H) 05/24/2016   BUN 26 (H) 05/24/2016   NA 139 05/24/2016   K 3.7 05/24/2016   CL 100 (L) 05/24/2016   CO2 31 01/27/2015    Anesthesia Physical Anesthesia Plan  ASA: II  Anesthesia Plan: General   Post-op Pain Management:    Induction: Intravenous  Airway Management Planned: LMA  Additional Equipment:   Intra-op Plan:   Post-operative Plan: Extubation in OR  Informed Consent: I have reviewed the patients History and Physical, chart, labs and discussed the procedure including the risks, benefits and alternatives for the proposed anesthesia with the patient or authorized representative who has indicated his/her understanding and acceptance.     Plan Discussed with:   Anesthesia Plan Comments:        Anesthesia Quick Evaluation

## 2016-05-24 ENCOUNTER — Ambulatory Visit (HOSPITAL_BASED_OUTPATIENT_CLINIC_OR_DEPARTMENT_OTHER): Payer: Medicare Other | Admitting: Anesthesiology

## 2016-05-24 ENCOUNTER — Encounter (HOSPITAL_BASED_OUTPATIENT_CLINIC_OR_DEPARTMENT_OTHER)
Admission: RE | Disposition: A | Discharge status: Home / Self Care | Payer: Self-pay | Source: Ambulatory Visit | Attending: Urology

## 2016-05-24 ENCOUNTER — Ambulatory Visit (HOSPITAL_BASED_OUTPATIENT_CLINIC_OR_DEPARTMENT_OTHER)
Admission: RE | Admit: 2016-05-24 | Discharge: 2016-05-24 | Disposition: A | Discharge status: Home / Self Care | Payer: Medicare Other | Source: Ambulatory Visit | Attending: Urology | Admitting: Urology

## 2016-05-24 ENCOUNTER — Encounter (HOSPITAL_BASED_OUTPATIENT_CLINIC_OR_DEPARTMENT_OTHER): Payer: Self-pay | Admitting: *Deleted

## 2016-05-24 DIAGNOSIS — Z923 Personal history of irradiation: Secondary | ICD-10-CM | POA: Diagnosis not present

## 2016-05-24 DIAGNOSIS — Z9841 Cataract extraction status, right eye: Secondary | ICD-10-CM | POA: Diagnosis not present

## 2016-05-24 DIAGNOSIS — Z961 Presence of intraocular lens: Secondary | ICD-10-CM | POA: Diagnosis not present

## 2016-05-24 DIAGNOSIS — D494 Neoplasm of unspecified behavior of bladder: Secondary | ICD-10-CM | POA: Diagnosis not present

## 2016-05-24 DIAGNOSIS — C61 Malignant neoplasm of prostate: Secondary | ICD-10-CM | POA: Insufficient documentation

## 2016-05-24 DIAGNOSIS — Z87891 Personal history of nicotine dependence: Secondary | ICD-10-CM | POA: Diagnosis not present

## 2016-05-24 DIAGNOSIS — I872 Venous insufficiency (chronic) (peripheral): Secondary | ICD-10-CM | POA: Insufficient documentation

## 2016-05-24 DIAGNOSIS — N183 Chronic kidney disease, stage 3 (moderate): Secondary | ICD-10-CM | POA: Diagnosis not present

## 2016-05-24 DIAGNOSIS — M1990 Unspecified osteoarthritis, unspecified site: Secondary | ICD-10-CM | POA: Insufficient documentation

## 2016-05-24 DIAGNOSIS — Z96653 Presence of artificial knee joint, bilateral: Secondary | ICD-10-CM | POA: Diagnosis not present

## 2016-05-24 DIAGNOSIS — E1122 Type 2 diabetes mellitus with diabetic chronic kidney disease: Secondary | ICD-10-CM | POA: Insufficient documentation

## 2016-05-24 DIAGNOSIS — Z8546 Personal history of malignant neoplasm of prostate: Secondary | ICD-10-CM | POA: Insufficient documentation

## 2016-05-24 DIAGNOSIS — I10 Essential (primary) hypertension: Secondary | ICD-10-CM | POA: Diagnosis not present

## 2016-05-24 DIAGNOSIS — I129 Hypertensive chronic kidney disease with stage 1 through stage 4 chronic kidney disease, or unspecified chronic kidney disease: Secondary | ICD-10-CM | POA: Insufficient documentation

## 2016-05-24 DIAGNOSIS — E119 Type 2 diabetes mellitus without complications: Secondary | ICD-10-CM | POA: Diagnosis not present

## 2016-05-24 HISTORY — DX: Type 2 diabetes mellitus without complications: E11.9

## 2016-05-24 HISTORY — DX: Malignant neoplasm of prostate: C61

## 2016-05-24 HISTORY — DX: Personal history of other specified conditions: Z87.898

## 2016-05-24 HISTORY — DX: Chronic kidney disease, stage 3 unspecified: N18.30

## 2016-05-24 HISTORY — DX: Chronic kidney disease, stage 3 (moderate): N18.3

## 2016-05-24 HISTORY — PX: CRYOABLATION: SHX1415

## 2016-05-24 HISTORY — DX: Personal history of other diseases of the digestive system: Z87.19

## 2016-05-24 HISTORY — DX: Personal history of irradiation: Z92.3

## 2016-05-24 HISTORY — DX: Unspecified hearing loss, unspecified ear: H91.90

## 2016-05-24 HISTORY — DX: Localized edema: R60.0

## 2016-05-24 HISTORY — DX: Anemia, unspecified: D64.9

## 2016-05-24 HISTORY — DX: Alcohol abuse, in remission: F10.11

## 2016-05-24 LAB — POCT I-STAT, CHEM 8
BUN: 26 mg/dL — ABNORMAL HIGH (ref 6–20)
CHLORIDE: 100 mmol/L — AB (ref 101–111)
Calcium, Ion: 1.16 mmol/L (ref 1.15–1.40)
Creatinine, Ser: 1.7 mg/dL — ABNORMAL HIGH (ref 0.61–1.24)
Glucose, Bld: 126 mg/dL — ABNORMAL HIGH (ref 65–99)
HCT: 40 % (ref 39.0–52.0)
Hemoglobin: 13.6 g/dL (ref 13.0–17.0)
POTASSIUM: 3.7 mmol/L (ref 3.5–5.1)
SODIUM: 139 mmol/L (ref 135–145)
TCO2: 28 mmol/L (ref 0–100)

## 2016-05-24 LAB — GLUCOSE, CAPILLARY: GLUCOSE-CAPILLARY: 115 mg/dL — AB (ref 65–99)

## 2016-05-24 SURGERY — CRYOABLATION, PROSTATE
Anesthesia: General | Site: Prostate

## 2016-05-24 MED ORDER — DEXAMETHASONE SODIUM PHOSPHATE 10 MG/ML IJ SOLN
INTRAMUSCULAR | Status: AC
  Filled 2016-05-24: qty 1

## 2016-05-24 MED ORDER — CEFAZOLIN SODIUM-DEXTROSE 2-4 GM/100ML-% IV SOLN
2.0000 g | INTRAVENOUS | Status: AC
  Administered 2016-05-24: 2 g via INTRAVENOUS
  Filled 2016-05-24: qty 100

## 2016-05-24 MED ORDER — ONDANSETRON HCL 4 MG/2ML IJ SOLN
INTRAMUSCULAR | Status: AC
  Filled 2016-05-24: qty 2

## 2016-05-24 MED ORDER — LIDOCAINE HCL (CARDIAC) 20 MG/ML IV SOLN
INTRAVENOUS | Status: DC | PRN
  Administered 2016-05-24: 60 mg via INTRAVENOUS

## 2016-05-24 MED ORDER — FENTANYL CITRATE (PF) 100 MCG/2ML IJ SOLN
INTRAMUSCULAR | Status: AC
  Filled 2016-05-24: qty 2

## 2016-05-24 MED ORDER — FENTANYL CITRATE (PF) 100 MCG/2ML IJ SOLN
INTRAMUSCULAR | Status: DC | PRN
  Administered 2016-05-24 (×8): 25 ug via INTRAVENOUS

## 2016-05-24 MED ORDER — CEPHALEXIN 500 MG PO CAPS: 500.0000 mg | ORAL_CAPSULE | Freq: Two times a day (BID) | ORAL | 0 refills | Status: DC

## 2016-05-24 MED ORDER — ONDANSETRON HCL 4 MG/2ML IJ SOLN
INTRAMUSCULAR | Status: DC | PRN
  Administered 2016-05-24: 4 mg via INTRAVENOUS

## 2016-05-24 MED ORDER — EPHEDRINE 5 MG/ML INJ
INTRAVENOUS | Status: AC
Start: 2016-05-24 — End: 2016-05-24
  Filled 2016-05-24: qty 10

## 2016-05-24 MED ORDER — CEFAZOLIN SODIUM-DEXTROSE 2-4 GM/100ML-% IV SOLN
INTRAVENOUS | Status: AC
  Filled 2016-05-24: qty 100

## 2016-05-24 MED ORDER — KETOROLAC TROMETHAMINE 30 MG/ML IJ SOLN
INTRAMUSCULAR | Status: AC
  Filled 2016-05-24: qty 1

## 2016-05-24 MED ORDER — DEXAMETHASONE SODIUM PHOSPHATE 4 MG/ML IJ SOLN
INTRAMUSCULAR | Status: DC | PRN
  Administered 2016-05-24: 10 mg via INTRAVENOUS

## 2016-05-24 MED ORDER — EPHEDRINE SULFATE 50 MG/ML IJ SOLN
INTRAMUSCULAR | Status: DC | PRN
Start: 2016-05-24 — End: 2016-05-24
  Administered 2016-05-24 (×2): 10 mg via INTRAVENOUS

## 2016-05-24 MED ORDER — DOCUSATE SODIUM 100 MG PO CAPS: 100.0000 mg | ORAL_CAPSULE | Freq: Two times a day (BID) | ORAL | 0 refills | Status: DC

## 2016-05-24 MED ORDER — STERILE WATER FOR IRRIGATION IR SOLN
Status: DC | PRN
  Administered 2016-05-24: 3000 mL

## 2016-05-24 MED ORDER — LIDOCAINE 2% (20 MG/ML) 5 ML SYRINGE
INTRAMUSCULAR | Status: AC
  Filled 2016-05-24: qty 5

## 2016-05-24 MED ORDER — KETOROLAC TROMETHAMINE 30 MG/ML IJ SOLN
INTRAMUSCULAR | Status: DC | PRN
  Administered 2016-05-24: 30 mg via INTRAVENOUS

## 2016-05-24 MED ORDER — PROPOFOL 10 MG/ML IV BOLUS
INTRAVENOUS | Status: DC | PRN
Start: 2016-05-24 — End: 2016-05-24
  Administered 2016-05-24: 150 mg via INTRAVENOUS
  Administered 2016-05-24: 50 mg via INTRAVENOUS

## 2016-05-24 MED ORDER — BELLADONNA ALKALOIDS-OPIUM 16.2-60 MG RE SUPP
RECTAL | Status: AC
  Filled 2016-05-24: qty 1

## 2016-05-24 MED ORDER — TRAMADOL HCL 50 MG PO TABS: 50.0000 mg | ORAL_TABLET | Freq: Four times a day (QID) | ORAL | 0 refills | Status: DC | PRN

## 2016-05-24 MED ORDER — PROMETHAZINE HCL 25 MG/ML IJ SOLN
6.2500 mg | INTRAMUSCULAR | Status: DC | PRN
  Filled 2016-05-24: qty 1

## 2016-05-24 MED ORDER — PROPOFOL 10 MG/ML IV BOLUS
INTRAVENOUS | Status: AC
  Filled 2016-05-24: qty 40

## 2016-05-24 MED ORDER — SODIUM CHLORIDE 0.9 % IV SOLN
INTRAVENOUS | Status: DC
  Administered 2016-05-24 (×2): via INTRAVENOUS
  Filled 2016-05-24: qty 1000

## 2016-05-24 MED ORDER — TAMSULOSIN HCL 0.4 MG PO CAPS: 0.4000 mg | ORAL_CAPSULE | Freq: Every day | ORAL | 0 refills | Status: DC

## 2016-05-24 MED ORDER — FENTANYL CITRATE (PF) 100 MCG/2ML IJ SOLN
25.0000 ug | INTRAMUSCULAR | Status: DC | PRN
  Filled 2016-05-24: qty 1

## 2016-05-24 MED ORDER — CEFAZOLIN IN D5W 1 GM/50ML IV SOLN
1.0000 g | INTRAVENOUS | Status: DC
  Filled 2016-05-24: qty 50

## 2016-05-24 MED ORDER — SODIUM CHLORIDE 0.9 % IR SOLN
Status: DC | PRN
  Administered 2016-05-24: 1000 mL

## 2016-05-24 SURGICAL SUPPLY — 36 items
BAG DRN ANRFLXCHMBR STRAP LEK (BAG)
BAG URINE DRAINAGE (UROLOGICAL SUPPLIES) ×2 IMPLANT
BAG URINE LEG 19OZ MD ST LTX (BAG) IMPLANT
BLADE CLIPPER SURG (BLADE) ×3 IMPLANT
BNDG CONFORM 3 STRL LF (GAUZE/BANDAGES/DRESSINGS) IMPLANT
BOOTIES KNEE HIGH SLOAN (MISCELLANEOUS) ×3 IMPLANT
CATH FOLEY 2WAY SLVR  5CC 16FR (CATHETERS) ×2
CATH FOLEY 2WAY SLVR 5CC 16FR (CATHETERS) ×1 IMPLANT
CHARGE TECH PROCEDURE ONCURA (LABOR (TRAVEL & OVERTIME)) ×3 IMPLANT
CLOTH BEACON ORANGE TIMEOUT ST (SAFETY) ×3 IMPLANT
COVER BACK TABLE 60X90IN (DRAPES) ×3 IMPLANT
DRAPE INCISE IOBAN 66X45 STRL (DRAPES) ×3 IMPLANT
DRSG TEGADERM 4X4.75 (GAUZE/BANDAGES/DRESSINGS) ×3 IMPLANT
DRSG TEGADERM 8X12 (GAUZE/BANDAGES/DRESSINGS) IMPLANT
GAS ARGON HIGH PRESSURE (MEDICAL GASES) ×3 IMPLANT
GAS HELIUM HIGH PRESSURE (MEDICAL GASES) ×3 IMPLANT
GAUZE SPONGE 4X4 12PLY STRL (GAUZE/BANDAGES/DRESSINGS) IMPLANT
GLOVE BIO SURGEON STRL SZ7.5 (GLOVE) ×5 IMPLANT
GOWN STRL REUS W/TWL LRG LVL3 (GOWN DISPOSABLE) ×2 IMPLANT
GOWN STRL REUS W/TWL XL LVL3 (GOWN DISPOSABLE) ×6 IMPLANT
GUIDEWIRE SUPER STIFF (WIRE) ×3 IMPLANT
HOLDER FOLEY CATH W/STRAP (MISCELLANEOUS) ×3 IMPLANT
KIT CRYO ENDOCARE (DISPOSABLE) ×2 IMPLANT
KIT PROSTATE PRESICE I (KITS) IMPLANT
KIT ROOM TURNOVER WOR (KITS) ×3 IMPLANT
NDL SPNL 18GX3.5 QUINCKE PK (NEEDLE) IMPLANT
NEEDLE SPNL 18GX3.5 QUINCKE PK (NEEDLE) IMPLANT
PACK CYSTOSCOPY (CUSTOM PROCEDURE TRAY) ×3 IMPLANT
PLUG CATH AND CAP STER (CATHETERS) ×3 IMPLANT
SPONGE GAUZE 4X4 12PLY STER LF (GAUZE/BANDAGES/DRESSINGS) ×2 IMPLANT
SYRINGE 10CC LL (SYRINGE) IMPLANT
SYRINGE IRR TOOMEY STRL 70CC (SYRINGE) IMPLANT
TUBE CONNECTING 12'X1/4 (SUCTIONS)
TUBE CONNECTING 12X1/4 (SUCTIONS) IMPLANT
UNDERPAD 30X30 INCONTINENT (UNDERPADS AND DIAPERS) ×5 IMPLANT
WATER STERILE IRR 500ML POUR (IV SOLUTION) ×3 IMPLANT

## 2016-05-24 NOTE — Discharge Instructions (Signed)
Cryotherapy for Prostate Cancer, Care After Refer to this sheet in the next few weeks. These instructions provide you with information on caring for yourself after your procedure. Your health care provider may also give you more specific instructions. Your treatment has been planned according to current medical practices, but problems sometimes occur. Call your health care provider if you have any problems or questions after your procedure. What can I expect after the procedure? The area behind the scrotum will probably be tender and bruised. For a short period of time you may have:  Difficulty passing urine. You may need a catheter for a few days to a month.  Blood in the urine or semen.  A feeling of constipation because of prostate swelling.  Frequent feeling of an urgent need to urinate. For a long period of time you may have:  Inflammation of the rectum. This happens in about 2% of people who have the procedure.  Erection problems. These vary with age and occur in about 15-40% of men.  Difficulty urinating. This is caused by scarring in the urethra.  Diarrhea. Follow these instructions at home:  Take medicines only as directed by your health care provider.  You will probably have a catheter in your bladder for several days. You will have blood in the urine bag and should drink a lot of fluids to keep it a light red color.  Keep all follow-up visits as directed by your health care provider. If you have a catheter, it will be removed during one of these visits.  Try not to sit directly on the area behind the scrotum. A soft cushion can decrease the discomfort. Ice packs may also be helpful for the discomfort. Do not put ice directly on the skin.  Shower and wash the area behind the scrotum gently. Do not sit in a tub.  Get help right away if:  You have a fever.  You have chills.  You have shortness of breath.  You have chest pain.  You have thick blood, like tomato juice,  in the urine bag.  Your catheter is blocked so urine cannot get into the bag. Your bladder area or lower abdomen may be swollen.  There is excessive bleeding from your rectum. It is normal to have a little blood mixed with your stool.  There is severe discomfort in the treated area that does not go away with pain medicine.  You have abdominal discomfort.  You have severe nausea or vomiting.  You develop any new or unusual symptoms.   Foley Catheter Care, Adult A Foley catheter is a soft, flexible tube that is placed into the bladder to drain urine. A Foley catheter may be inserted if:  You leak urine or are not able to control when you urinate (urinary incontinence).  You are not able to urinate when you need to (urinary retention).  You had prostate surgery or surgery on the genitals.  You have certain medical conditions, such as multiple sclerosis, dementia, or a spinal cord injury. If you are going home with a Foley catheter in place, follow the instructions below. TAKING CARE OF THE CATHETER 1. Wash your hands with soap and water. 2. Using mild soap and warm water on a clean washcloth:  Clean the area on your body closest to the catheter insertion site using a circular motion, moving away from the catheter. Never wipe toward the catheter because this could sweep bacteria up into the urethra and cause infection.  Remove all traces  of soap. Pat the area dry with a clean towel. For males, reposition the foreskin. 3. Attach the catheter to your leg so there is no tension on the catheter. Use adhesive tape or a leg strap. If you are using adhesive tape, remove any sticky residue left behind by the previous tape you used. 4. Keep the drainage bag below the level of the bladder, but keep it off the floor. 5. Check throughout the day to be sure the catheter is working and urine is draining freely. Make sure the tubing does not become kinked. 6. Do not pull on the catheter or try to  remove it. Pulling could damage internal tissues. TAKING CARE OF THE DRAINAGE BAGS You will be given two drainage bags to take home. One is a large overnight drainage bag, and the other is a smaller leg bag that fits underneath clothing. You may wear the overnight bag at any time, but you should never wear the smaller leg bag at night. Follow the instructions below for how to empty, change, and clean your drainage bags. Emptying the Drainage Bag  You must empty your drainage bag when it is ?- full or at least 2-3 times a day. 1. Wash your hands with soap and water. 2. Keep the drainage bag below your hips, below the level of your bladder. This stops urine from going back into the tubing and into your bladder. 3. Hold the dirty bag over the toilet or a clean container. 4. Open the pour spout at the bottom of the bag and empty the urine into the toilet or container. Do not let the pour spout touch the toilet, container, or any other surface. Doing so can place bacteria on the bag, which can cause an infection. 5. Clean the pour spout with a gauze pad or cotton ball that has rubbing alcohol on it. 6. Close the pour spout. 7. Attach the bag to your leg with adhesive tape or a leg strap. 8. Wash your hands well. Changing the Drainage Bag  Change your drainage bag once a month or sooner if it starts to smell bad or look dirty. Below are steps to follow when changing the drainage bag. 1. Wash your hands with soap and water. 2. Pinch off the rubber catheter so that urine does not spill out. 3. Disconnect the catheter tube from the drainage tube at the connection valve. Do not let the tubes touch any surface. 4. Clean the end of the catheter tube with an alcohol wipe. Use a different alcohol wipe to clean the end of the drainage tube. 5. Connect the catheter tube to the drainage tube of the clean drainage bag. 6. Attach the new bag to the leg with adhesive tape or a leg strap. Avoid attaching the new bag  too tightly. 7. Wash your hands well. Cleaning the Drainage Bag  1. Wash your hands with soap and water. 2. Wash the bag in warm, soapy water. 3. Rinse the bag thoroughly with warm water. 4. Fill the bag with a solution of white vinegar and water (1 cup vinegar to 1 qt warm water [.2 L vinegar to 1 L warm water]). Close the bag and soak it for 30 minutes in the solution. 5. Rinse the bag with warm water. 6. Hang the bag to dry with the pour spout open and hanging downward. 7. Store the clean bag (once it is dry) in a clean plastic bag. 8. Wash your hands well. PREVENTING INFECTION  Wash your hands  before and after handling your catheter.  Take showers daily and wash the area where the catheter enters your body. Do not take baths. Replace wet leg straps with dry ones, if this applies.  Do not use powders, sprays, or lotions on the genital area. Only use creams, lotions, or ointments as directed by your caregiver.  For females, wipe from front to back after each bowel movement.  Drink enough fluids to keep your urine clear or pale yellow unless you have a fluid restriction.  Do not let the drainage bag or tubing touch or lie on the floor.  Wear cotton underwear to absorb moisture and to keep your skin drier. SEEK MEDICAL CARE IF:   Your urine is cloudy or smells unusually bad.  Your catheter becomes clogged.  You are not draining urine into the bag or your bladder feels full.  Your catheter starts to leak. SEEK IMMEDIATE MEDICAL CARE IF:   You have pain, swelling, redness, or pus where the catheter enters the body.  You have pain in the abdomen, legs, lower back, or bladder.  You have a fever.  You see blood fill the catheter, or your urine is pink or red.  You have nausea, vomiting, or chills.  Your catheter gets pulled out. MAKE SURE YOU:   Understand these instructions.  Will watch your condition.  Will get help right away if you are not doing well or get  worse. This information is not intended to replace advice given to you by your health care provider. Make sure you discuss any questions you have with your health care provider. Document Released: 06/24/2005 Document Revised: 11/08/2013 Document Reviewed: 06/10/2015 Elsevier Interactive Patient Education  2017 [place] Anesthesia Home Care Instructions  Activity: Get plenty of rest for the remainder of the day. A responsible adult should stay with you for 24 hours following the procedure.  For the next 24 hours, DO NOT: -Drive a car -Paediatric nurse -Drink alcoholic beverages -Take any medication unless instructed by your physician -Make any legal decisions or sign important papers.  Meals: Start with liquid foods such as gelatin or soup. Progress to regular foods as tolerated. Avoid greasy, spicy, heavy foods. If nausea and/or vomiting occur, drink only clear liquids until the nausea and/or vomiting subsides. Call your physician if vomiting continues.  Special Instructions/Symptoms: Your throat may feel dry or sore from the anesthesia or the breathing tube placed in your throat during surgery. If this causes discomfort, gargle with warm salt water. The discomfort should disappear within 24 hours.  If you had a scopolamine patch placed behind your ear for the management of post- operative nausea and/or vomiting:  1. The medication in the patch is effective for 72 hours, after which it should be removed.  Wrap patch in a tissue and discard in the trash. Wash hands thoroughly with soap and water. 2. You may remove the patch earlier than 72 hours if you experience unpleasant side effects which may include dry mouth, dizziness or visual disturbances. 3. Avoid touching the patch. Wash your hands with soap and water after contact with the patch.

## 2016-05-24 NOTE — Op Note (Addendum)
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   Indication for procedure: 75 year old white male status post radiation therapy for Gleason 6 prostate cancer and 2007. He had a slowly rising PSA and repeat biopsies revealed Gleason 4+3 and Gleason 4+4 and 2 cores on the left mid and left apex of the prostate. MRI was negative for metastatic disease although there was a suspicious anterior lesion. He was brought today for salvage cryotherapy and denied any lower urinary tract symptoms or gross hematuria.  Findings: Prostate measured 15.1 g. On cystoscopy the urethra and prostatic urethra were normal after probe placement, the bladder neck was normal after probe placement, there were multifocal a superficial appearing papillary tumors mainly right anterior bladder. Representative photos were taken. The trigone and ureteral orifices appeared normal.  Description of procedure: After consent was obtained patient brought to the operating room. After adequate anesthesia his placed in lithotomy position and prepped and draped in the usual sterile fashion. A timeout was performed to confirm the patient and procedure. The patient scrotum was taped superiorly and a Foley catheter placed. The ultrasound probe was inserted per urethra. The prostate measured a width of 3.5 cm, height of 2.4 cm in length of 3.5 senna meters. Volume 15.1 mL.  The grid was placed. Planning was done based on the prostate measurements utilizing 6 needles. One was placed right anterior to left anterior 3 right mid 4 left mid 5 right posterior, 6 left posterior. External sphincter temperature probe was placed and Denonvilliers temperature probe placed. Probes were checked in the axial and sagittal views.  Length was appropriate. The Foley catheter was then removed and flexible cystoscopy was performed. The urethra and prostatic urethra appeared normal. In the bladder and bladder neck was inspected. No probe placement was noted in the bladder. Papillary tumors were noted. A superstiff wire was advanced through the cystoscope and the cystoscope backed out. The urethral warming catheter was advanced under direct ultrasonic vision into the bladder. The wire was removed. The warming catheter was secured and warm fluids cycled.  All the probes were then rechecked in the axial and sagittal view and a freeze thaw cycle was commenced. 1 and 2 were activated followed by 345 and 6. Excellent ice ball was formed. We noted the external sphincter catheter temperature probe went down to 0 and on inspection #5 probe had pulled out likely from insertion which were all the ultrasound probe. On closer inspection the tip of #5 it actually pulled out of the capsule and was adjacent to the external sphincter probe. The probes were thawed.  #5 was readvanced. Probe position checked again and noted to be in good position. A second freeze thaw cycle was done with flattening of the ice and complete cryoablation of the prostate. Temperature probes were normal. The second thaw was done passively and once the probes were able to be removed they were backed out as well as the temperature probes. Pressure was held on the perineum while the prostate passively thawed in the warming catheter remained in place. Once thawing was complete the warming catheter was removed and a 16 [place] Foley was placed and left to gravity drainage. A dressing was placed per perineum. The patient was awakened taken to recovery room in stable condition.  Complications: None  Blood loss: 15 mL  Drains: 16  [place] Foley  Specimens: None  Disposition: Patient stable to PACU

## 2016-05-24 NOTE — H&P (Signed)
H&P  Chief Complaint: prostate cancer   History of Present Illness:  He was diagnosed with this 10/23/2005. His previous urologist was Dr Campbell Soup in [place], [place]. PSA at the time of dx was 4.32 . 4/12 cores revealed Gleason 3+3=6 pattern. He underwent 40 external beam radiotherapy treatments.   Because of elevating PSA trend, he underwent repeat biopsy of his prostate. That was performed on3/14/2017. Prostate volume was only 9 mL. 2/6 cores revealed adenocarcinoma, both on the left side. 1 revealed Gleason 4+3 = 7 pattern, the other Gleason 4+4 = 8 pattern.   Following that, MRI and CT revealed no significant evidence of extraprostatic disease. MRI was also performed revealing a 7 x 9 mm apical nodule suspicious for high-grade disease. Prostate about 18 g on biopsy.   He has no significant urologic symptoms. He denies voiding dysfunction, gross hematuria or bony pain. He was referred again to consider cryotherapy.   His current PSA, 03/05/2016, is 2.3.  He is well with no voiding complaints.    Past Medical History:  Diagnosis Date  . Arthritis   . Bilateral lower extremity edema   . Chronic venous insufficiency   . CKD (chronic kidney disease), stage III   . History of alcohol abuse    none since 2013 per pt  . History of chronic hepatitis    Genotype 1, FO/F1-- treated w/ harvoni (completed May 2016)  . History of external beam radiation therapy    2007 prostate  . History of intravenous drug use in remission    per pt in remission since 1980's  . HOH (hard of hearing)   . Hypertension   . Normocytic anemia   . Prostate cancer ([place])   . Recurrent prostate cancer [facility]) urologist-  dr Junious Silk   dx 2007  s/p  external beam radioation therapy/ recurrent 12-2015  Gleason 4+4  . Type 2 diabetes, diet controlled ([place])    no meds since 2010 approx   Past Surgical History:  Procedure Laterality Date  . CATARACT EXTRACTION W/PHACO Left 12/14/2012   Procedure:  CATARACT EXTRACTION PHACO AND INTRAOCULAR LENS PLACEMENT (IOC);  Surgeon: Tonny Branch, MD;  Location: [facility];  Service: Ophthalmology;  Laterality: Left;  CDE: 13.74  . CATARACT EXTRACTION W/PHACO Right 12/28/2012   Procedure: CATARACT EXTRACTION PHACO AND INTRAOCULAR LENS PLACEMENT (IOC);  Surgeon: Tonny Branch, MD;  Location: [facility];  Service: Ophthalmology;  Laterality: Right;  CDE:12.80  . COLONOSCOPY N/A 02/14/2014   [id number] colonic polyps-removed as described above Colonic diverticulosis (tubular adenoma)  . ENDOVENOUS ABLATION SAPHENOUS VEIN W/ LASER  2013   Right leg X 2 by Dr. Debara Pickett  . ESOPHAGOGASTRODUODENOSCOPY N/A 02/14/2014   [id number] large pedunculated gastric polyp (hyperplastic)  . REIMPLANTATION OF TOTAL KNEE Left 03/16/2010  . TONSILLECTOMY  child  . TOTAL KNEE  PROSTHESIS REMOVAL W/ SPACER INSERTION Left 12/29/2009  . TOTAL KNEE ARTHROPLASTY Bilateral left 2007/   right 2005 approx    Home Medications:  Prescriptions Prior to Admission  Medication Sig Dispense Refill Last Dose  . HYDROcodone-acetaminophen (LORCET HD) 10-325 MG per tablet Take 1 tablet by mouth every 6 (six) hours as needed for pain.   05/24/2016 at 0400  . Multiple Vitamins-Minerals (MULTIVITAMIN WITH MINERALS) tablet Take 1 tablet by mouth daily.     05/23/2016 at Unknown time  . torsemide (DEMADEX) 20 MG tablet Take 3 tablets by mouth 3 (three) times daily.    05/23/2016 at Unknown time   Allergies: No Known Allergies  Family History  Problem Relation Age of Onset  . Colon cancer Neg Hx   . Liver disease Neg Hx    Social History:  reports that he quit smoking about 44 years ago. He quit after 12.00 years of use. He has never used smokeless tobacco. He reports that he does not drink alcohol or use drugs.  ROS: A complete review of systems was performed.  All systems are negative except for pertinent findings as noted. ROS   Physical Exam:  Vital signs in last 24 hours: Temp:  [97.8 F (36.6  C)] 97.8 F (36.6 C) (11/17 0614) Resp:  [18] 18 (11/17 0614) BP: (123)/(73) 123/73 (11/17 0614) SpO2:  [100 %] 100 % (11/17 0614) Weight:  [97.7 kg (215 lb 5 oz)] 97.7 kg (215 lb 5 oz) (11/17 [id number]) General:  Alert and oriented, No acute distress HEENT: Normocephalic, atraumatic Neck: No JVD or lymphadenopathy Cardiovascular: Regular rate and rhythm Lungs: Regular rate and effort Abdomen: Soft, nontender, nondistended, no abdominal masses Extremities: No edema Neurologic: Grossly intact  Laboratory Data:  Results for orders placed or performed during the hospital encounter of 05/24/16 (from the past 24 hour(s))  I-STAT, chem 8     Status: Abnormal   Collection Time: 05/24/16  6:38 AM  Result Value Ref Range   Sodium 139 135 - 145 mmol/L   Potassium 3.7 3.5 - 5.1 mmol/L   Chloride 100 (L) 101 - 111 mmol/L   BUN 26 (H) 6 - 20 mg/dL   Creatinine, Ser 1.70 (H) 0.61 - 1.24 mg/dL   Glucose, Bld 126 (H) 65 - 99 mg/dL   Calcium, Ion 1.16 1.15 - 1.40 mmol/L   TCO2 28 0 - 100 mmol/L   Hemoglobin 13.6 13.0 - 17.0 g/dL   HCT 40.0 39.0 - 52.0 %   No results found for this or any previous visit (from the past 240 hour(s)). Creatinine:  Recent Labs  05/24/16 [id number]  CREATININE 1.70*    Impression/Assessment:  High Risk localized prostate cancer following prior XRT -   Plan:  Discussed again with patient the nature, potential benefits, risks and alternatives to prostate cryoablation, including side effects of the proposed treatment, the likelihood of the patient achieving the goals of the procedure, and any potential problems that might occur during the procedure or recuperation. All questions answered. Patient elects to proceed.    Bridget  05/24/2016, 7:22 AM

## 2016-05-24 NOTE — Anesthesia Postprocedure Evaluation (Signed)
Anesthesia Post Note  Patient: Colin [name]  Procedure(s) Performed: Procedure(s) (LRB): CRYO ABLATION PROSTATE (N/A)  Patient location during evaluation: PACU Anesthesia Type: General Level of consciousness: awake and alert Pain management: pain level controlled Vital Signs Assessment: post-procedure vital signs reviewed and stable Respiratory status: spontaneous breathing, nonlabored ventilation and respiratory function stable Cardiovascular status: blood pressure returned to baseline and stable Postop Assessment: no signs of nausea or vomiting Anesthetic complications: no    Last Vitals:  Vitals:   05/24/16 1100 05/24/16 1112  BP: 137/77   Pulse: 64 62  Resp: 10 13  Temp:      Last Pain:  Vitals:   05/24/16 0614  TempSrc: Oral                 [name],W. EDMOND

## 2016-05-24 NOTE — Anesthesia Procedure Notes (Signed)
Procedure Name: LMA Insertion Date/Time: 05/24/2016 8:16 AM Performed by: Justice Rocher Pre-anesthesia Checklist: Patient identified, Emergency Drugs available, Suction available and Patient being monitored Patient Re-evaluated:Patient Re-evaluated prior to inductionOxygen Delivery Method: Circle system utilized Preoxygenation: Pre-oxygenation with 100% oxygen Intubation Type: IV induction Ventilation: Mask ventilation without difficulty LMA: LMA inserted LMA Size: 5.0 Number of attempts: 1 Airway Equipment and Method: Bite block Placement Confirmation: positive ETCO2 Tube secured with: Tape Dental Injury: Teeth and Oropharynx as per pre-operative assessment

## 2016-05-24 NOTE — Transfer of Care (Signed)
Immediate Anesthesia Transfer of Care Note  Patient: Colin [name]  Procedure(s) Performed: Procedure(s) (LRB): CRYO ABLATION PROSTATE (N/A)  Patient Location: PACU  Anesthesia Type: General  Level of Consciousness: awake, sedated, patient cooperative and responds to stimulation  Airway & Oxygen Therapy: Patient Spontanous Breathing and Patient connected to face mask oxygen  Post-op Assessment: Report given to PACU RN, Post -op Vital signs reviewed and stable and Patient moving all extremities  Post vital signs: Reviewed and stable  Complications: No apparent anesthesia complications

## 2016-05-27 ENCOUNTER — Emergency Department (HOSPITAL_COMMUNITY)
Admission: EM | Admit: 2016-05-27 | Discharge: 2016-05-27 | Disposition: A | Discharge status: Home / Self Care | Payer: Medicare Other | Attending: Emergency Medicine | Admitting: Emergency Medicine

## 2016-05-27 ENCOUNTER — Encounter (HOSPITAL_BASED_OUTPATIENT_CLINIC_OR_DEPARTMENT_OTHER): Payer: Self-pay | Admitting: Urology

## 2016-05-27 DIAGNOSIS — I129 Hypertensive chronic kidney disease with stage 1 through stage 4 chronic kidney disease, or unspecified chronic kidney disease: Secondary | ICD-10-CM | POA: Insufficient documentation

## 2016-05-27 DIAGNOSIS — R1030 Lower abdominal pain, unspecified: Secondary | ICD-10-CM | POA: Diagnosis present

## 2016-05-27 DIAGNOSIS — N183 Chronic kidney disease, stage 3 (moderate): Secondary | ICD-10-CM | POA: Insufficient documentation

## 2016-05-27 DIAGNOSIS — Z87891 Personal history of nicotine dependence: Secondary | ICD-10-CM | POA: Diagnosis not present

## 2016-05-27 DIAGNOSIS — E1122 Type 2 diabetes mellitus with diabetic chronic kidney disease: Secondary | ICD-10-CM | POA: Insufficient documentation

## 2016-05-27 DIAGNOSIS — Z8546 Personal history of malignant neoplasm of prostate: Secondary | ICD-10-CM | POA: Insufficient documentation

## 2016-05-27 DIAGNOSIS — R339 Retention of urine, unspecified: Secondary | ICD-10-CM | POA: Insufficient documentation

## 2016-05-27 DIAGNOSIS — Z79899 Other long term (current) drug therapy: Secondary | ICD-10-CM | POA: Insufficient documentation

## 2016-05-27 LAB — URINE MICROSCOPIC-ADD ON

## 2016-05-27 LAB — CBC WITH DIFFERENTIAL/PLATELET
Basophils Absolute: 0 10*3/uL (ref 0.0–0.1)
Basophils Relative: 0 %
EOS ABS: 0.1 10*3/uL (ref 0.0–0.7)
Eosinophils Relative: 1 %
HCT: 36.6 % — ABNORMAL LOW (ref 39.0–52.0)
HEMOGLOBIN: 12.1 g/dL — AB (ref 13.0–17.0)
LYMPHS ABS: 1.1 10*3/uL (ref 0.7–4.0)
Lymphocytes Relative: 9 %
MCH: 29.7 pg (ref 26.0–34.0)
MCHC: 33.1 g/dL (ref 30.0–36.0)
MCV: 89.9 fL (ref 78.0–100.0)
Monocytes Absolute: 1.1 10*3/uL — ABNORMAL HIGH (ref 0.1–1.0)
Monocytes Relative: 10 %
NEUTROS ABS: 9.7 10*3/uL — AB (ref 1.7–7.7)
NEUTROS PCT: 80 %
Platelets: 210 10*3/uL (ref 150–400)
RBC: 4.07 MIL/uL — AB (ref 4.22–5.81)
RDW: 15.2 % (ref 11.5–15.5)
WBC: 11.9 10*3/uL — AB (ref 4.0–10.5)

## 2016-05-27 LAB — BASIC METABOLIC PANEL
ANION GAP: 10 (ref 5–15)
BUN: 24 mg/dL — ABNORMAL HIGH (ref 6–20)
CHLORIDE: 100 mmol/L — AB (ref 101–111)
CO2: 24 mmol/L (ref 22–32)
CREATININE: 1.45 mg/dL — AB (ref 0.61–1.24)
Calcium: 8.8 mg/dL — ABNORMAL LOW (ref 8.9–10.3)
GFR calc non Af Amer: 46 mL/min — ABNORMAL LOW (ref >60)
GFR, EST AFRICAN AMERICAN: 53 mL/min — AB (ref >60)
Glucose, Bld: 132 mg/dL — ABNORMAL HIGH (ref 65–99)
POTASSIUM: 3.8 mmol/L (ref 3.5–5.1)
SODIUM: 134 mmol/L — AB (ref 135–145)

## 2016-05-27 LAB — URINALYSIS, ROUTINE W REFLEX MICROSCOPIC
BILIRUBIN URINE: NEGATIVE
GLUCOSE, UA: NEGATIVE mg/dL
Ketones, ur: NEGATIVE mg/dL
Leukocytes, UA: NEGATIVE
Nitrite: NEGATIVE
PH: 5.5 (ref 5.0–8.0)
Protein, ur: NEGATIVE mg/dL

## 2016-05-27 NOTE — ED Triage Notes (Signed)
Placed foley catheter placed Friday, pt has not been able to void today

## 2016-05-27 NOTE — ED Notes (Signed)
Flushed catheter with 70 ML and aspirated fluid back, catheter began to drain with 500ML of urine noted. Urine was noted to be tea colored.  Flushed catheter with 500ML of sterile water. Saw few tiny clots. Urine color noted to be bright red. Patient stated he felt much better and did not feel like he needed to void anymore.

## 2016-05-27 NOTE — ED Provider Notes (Signed)
[facility] DEPT Provider Note   CSN: [id number] Arrival date & time: 05/27/16  1407     History   Chief Complaint Chief Complaint  Patient presents with  . Foley catheter    HPI Colin [name] is a 75 y.o. male.  Patient has postoperative procedure on his prostate on Friday. Patient had Foley catheter in place since then. Followed by urology at [facility]. Patient the with sudden increase in suprapubic abdominal pain today and the catheter stopped draining. No fevers no nausea or vomiting. Patient feels like he needs to urinate but is unable to.      Past Medical History:  Diagnosis Date  . Arthritis   . Bilateral lower extremity edema   . Chronic venous insufficiency   . CKD (chronic kidney disease), stage III   . History of alcohol abuse    none since 2013 per pt  . History of chronic hepatitis    Genotype 1, FO/F1-- treated w/ harvoni (completed May 2016)  . History of external beam radiation therapy    2007 prostate  . History of intravenous drug use in remission    per pt in remission since 1980's  . HOH (hard of hearing)   . Hypertension   . Normocytic anemia   . Prostate cancer ([place])   . Recurrent prostate cancer [facility]) urologist-  dr Junious Silk   dx 2007  s/p  external beam radioation therapy/ recurrent 12-2015  Gleason 4+4  . Type 2 diabetes, diet controlled ([place])    no meds since 2010 approx    Patient Active Problem List   Diagnosis Date Noted  . 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 Normocytic anemia 05/16/2014  . Colon cancer screening 01/14/2014  . Lymphedema 02/25/2013  . Leg edema, right 05/04/2012  . Varicose veins of lower extremities with other complications [id number]  . Hepatitis C 08/28/2011  . HYPERTENSION 01/10/2010  . NEOPLASM, MALIGNANT, PROSTATE, HX OF 01/10/2010  . DIABETES MELLITUS, TYPE II 01/04/2010  . 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    Past Surgical History:  Procedure Laterality Date  .  CATARACT EXTRACTION W/PHACO Left 12/14/2012   Procedure: CATARACT EXTRACTION PHACO AND INTRAOCULAR LENS PLACEMENT (IOC);  Surgeon: Tonny Branch, MD;  Location: [facility];  Service: Ophthalmology;  Laterality: Left;  CDE: 13.74  . CATARACT EXTRACTION W/PHACO Right 12/28/2012   Procedure: CATARACT EXTRACTION PHACO AND INTRAOCULAR LENS PLACEMENT (IOC);  Surgeon: Tonny Branch, MD;  Location: [facility];  Service: Ophthalmology;  Laterality: Right;  CDE:12.80  . COLONOSCOPY N/A 02/14/2014   [id number] colonic polyps-removed as described above Colonic diverticulosis (tubular adenoma)  . CRYOABLATION N/A 05/24/2016   Procedure: CRYO ABLATION PROSTATE;  Surgeon: Festus Aloe, MD;  Location: [facility];  Service: Urology;  Laterality: N/A;  . ENDOVENOUS ABLATION SAPHENOUS VEIN W/ LASER  2013   Right leg X 2 by Dr. Debara Pickett  . ESOPHAGOGASTRODUODENOSCOPY N/A 02/14/2014   [id number] large pedunculated gastric polyp (hyperplastic)  . REIMPLANTATION OF TOTAL KNEE Left 03/16/2010  . TONSILLECTOMY  child  . TOTAL KNEE  PROSTHESIS REMOVAL W/ SPACER INSERTION Left 12/29/2009  . TOTAL KNEE ARTHROPLASTY Bilateral left 2007/   right 2005 approx       Home Medications    Prior to Admission medications   Medication Sig Start Date End Date Taking? Authorizing Provider  cephALEXin (KEFLEX) 500 MG capsule Take 1 capsule (500 mg total) by mouth 2 (two) times daily. 05/24/16  Yes Festus Aloe, MD  docusate sodium (COLACE)  100 MG capsule Take 1 capsule (100 mg total) by mouth 2 (two) times daily. 05/24/16  Yes Festus Aloe, MD  HYDROcodone-acetaminophen (LORCET HD) 10-325 MG per tablet Take 1 tablet by mouth every 6 (six) hours as needed for pain.   Yes Historical Provider, MD  Multiple Vitamins-Minerals (MULTIVITAMIN WITH MINERALS) tablet Take 1 tablet by mouth daily.     Yes Historical Provider, MD  tamsulosin (FLOMAX) 0.4 MG CAPS capsule Take 1 capsule (0.4 mg total) by mouth daily after supper.  05/24/16  Yes Festus Aloe, MD  torsemide (DEMADEX) 20 MG tablet Take 3 tablets by mouth 2 (two) times daily.  12/28/13  Yes Historical Provider, MD  traMADol (ULTRAM) 50 MG tablet Take 1 tablet (50 mg total) by mouth every 6 (six) hours as needed for moderate pain. 05/24/16   Festus Aloe, MD    Family History Family History  Problem Relation Age of Onset  . Colon cancer Neg Hx   . Liver disease Neg Hx     Social History Social History  Substance Use Topics  . Smoking status: Former Smoker    Years: 12.00    Quit date: 05/04/1972  . Smokeless tobacco: Never Used  . Alcohol use No     Comment: hx alcohol abuse-- per pt  none since 2013.     Allergies   Patient has no known allergies.   Review of Systems Review of Systems  Constitutional: Negative for fever.  HENT: Negative for congestion.   Eyes: Negative for redness.  Respiratory: Negative for shortness of breath.   Cardiovascular: Negative for chest pain.  Gastrointestinal: Positive for abdominal pain. Negative for nausea and vomiting.  Genitourinary: Positive for difficulty urinating.  Musculoskeletal: Negative for back pain.  Skin: Negative for rash.  Neurological: Negative for headaches.  Hematological: Does not bruise/bleed easily.  Psychiatric/Behavioral: Negative for confusion.     Physical Exam Updated Vital Signs BP 120/80   Pulse 101   Temp 98.3 F (36.8 C)   Resp 16   Ht 5\' 7"  (1.702 m)   Wt 97.5 kg   SpO2 97%   BMI 33.67 kg/m   Physical Exam  Constitutional: He is oriented to person, place, and time. He appears well-developed and well-nourished. He appears distressed.  HENT:  Head: Normocephalic and atraumatic.  Eyes: EOM are normal. Pupils are equal, round, and reactive to light.  Neck: Normal range of motion. Neck supple.  Cardiovascular: Normal rate and regular rhythm.   Pulmonary/Chest: Effort normal and breath sounds normal. No respiratory distress.  Abdominal: Soft. Bowel  sounds are normal. He exhibits mass. There is no tenderness.  Bladder distended  Musculoskeletal: Normal range of motion.  Neurological: He is alert and oriented to person, place, and time. No cranial nerve deficit or sensory deficit. He exhibits normal muscle tone. Coordination normal.  Skin: Skin is warm.  Nursing note and vitals reviewed.    ED Treatments / Results  Labs (all labs ordered are listed, but only abnormal results are displayed) Labs Reviewed  URINALYSIS, ROUTINE W REFLEX MICROSCOPIC (NOT AT [facility]) - Abnormal; Notable for the following:       Result Value   Specific Gravity, Urine <1.005 (*)    Hgb urine dipstick LARGE (*)    All other components within normal limits  BASIC METABOLIC PANEL - Abnormal; Notable for the following:    Sodium 134 (*)    Chloride 100 (*)    Glucose, Bld 132 (*)    BUN 24 (*)  Creatinine, Ser 1.45 (*)    Calcium 8.8 (*)    GFR calc non Af Amer 46 (*)    GFR calc Af Amer 53 (*)    All other components within normal limits  CBC WITH DIFFERENTIAL/PLATELET - Abnormal; Notable for the following:    WBC 11.9 (*)    RBC 4.07 (*)    Hemoglobin 12.1 (*)    HCT 36.6 (*)    Neutro Abs 9.7 (*)    Monocytes Absolute 1.1 (*)    All other components within normal limits  URINE MICROSCOPIC-ADD ON - Abnormal; Notable for the following:    Squamous Epithelial / LPF 0-5 (*)    Bacteria, UA FEW (*)    All other components within normal limits  URINE CULTURE    EKG  EKG Interpretation None       Radiology No results found.  Procedures Procedures (including critical care time)  Medications Ordered in ED Medications - No data to display   Initial Impression / Assessment and Plan / ED Course  I have reviewed the triage vital signs and the nursing notes.  Pertinent labs & imaging results that were available during my care of the patient were reviewed by me and considered in my medical decision making (see chart for details).  Clinical  Course    Patient status post Althia Forts procedure on prostate on Friday.  patient has had Foley catheter in place since then. Patient has a history of prostate cancer. Followed by urology at [facility] long. Suddenly the day of catheter stopped draining. Patient developed a lot of discomfort in the suprapubic area. Bladder scan showed that he had about 4 and 50 mL of urine there. Catheter was irrigated some small clots came out. Then 0 dated the with an additional 500 mL of the saline. Patient now with good flow no further clots. Urine is tinged pink. Labs without any significant abnormalities to include a urinalysis. Urine culture is pending.    Final Clinical Impressions(s) / ED Diagnoses   Final diagnoses:  Urinary retention    New Prescriptions New Prescriptions   No medications on file     Fredia Sorrow, MD 05/27/16 1759

## 2016-05-27 NOTE — ED Notes (Signed)
425ML of urine noted in bladder per bladder scan.

## 2016-05-27 NOTE — Discharge Instructions (Signed)
Follow-up with your urologist as scheduled. Return if the Foley catheter stops draining. Today's workup without any significant findings.

## 2016-05-29 LAB — URINE CULTURE: Culture: NO GROWTH

## 2016-07-05 DIAGNOSIS — D414 Neoplasm of uncertain behavior of bladder: Secondary | ICD-10-CM | POA: Diagnosis not present

## 2016-07-05 DIAGNOSIS — R8279 Other abnormal findings on microbiological examination of urine: Secondary | ICD-10-CM | POA: Diagnosis not present

## 2016-07-05 DIAGNOSIS — C61 Malignant neoplasm of prostate: Secondary | ICD-10-CM | POA: Diagnosis not present

## 2016-07-23 ENCOUNTER — Ambulatory Visit (INDEPENDENT_AMBULATORY_CARE_PROVIDER_SITE_OTHER): Payer: Medicare Other | Admitting: Urology

## 2016-07-23 DIAGNOSIS — C61 Malignant neoplasm of prostate: Secondary | ICD-10-CM

## 2016-08-05 DIAGNOSIS — M25561 Pain in right knee: Secondary | ICD-10-CM | POA: Diagnosis not present

## 2016-08-05 DIAGNOSIS — G894 Chronic pain syndrome: Secondary | ICD-10-CM | POA: Diagnosis not present

## 2016-08-05 DIAGNOSIS — E119 Type 2 diabetes mellitus without complications: Secondary | ICD-10-CM | POA: Diagnosis not present

## 2016-08-13 ENCOUNTER — Ambulatory Visit: Payer: Self-pay | Admitting: Urology

## 2016-08-20 NOTE — Progress Notes (Signed)
H/o elevated alk phos, intrahepatic biliary dilation on ct.   Due for LFTs.

## 2016-08-22 ENCOUNTER — Other Ambulatory Visit: Payer: Self-pay | Admitting: Gastroenterology

## 2016-08-22 ENCOUNTER — Other Ambulatory Visit: Payer: Self-pay

## 2016-08-22 DIAGNOSIS — K838 Other specified diseases of biliary tract: Secondary | ICD-10-CM

## 2016-09-18 DIAGNOSIS — E119 Type 2 diabetes mellitus without complications: Secondary | ICD-10-CM | POA: Diagnosis not present

## 2016-09-18 DIAGNOSIS — I739 Peripheral vascular disease, unspecified: Secondary | ICD-10-CM | POA: Diagnosis not present

## 2016-09-18 DIAGNOSIS — G894 Chronic pain syndrome: Secondary | ICD-10-CM | POA: Diagnosis not present

## 2016-09-18 DIAGNOSIS — Z1389 Encounter for screening for other disorder: Secondary | ICD-10-CM | POA: Diagnosis not present

## 2016-09-18 DIAGNOSIS — E1159 Type 2 diabetes mellitus with other circulatory complications: Secondary | ICD-10-CM | POA: Diagnosis not present

## 2016-09-19 ENCOUNTER — Other Ambulatory Visit (HOSPITAL_COMMUNITY): Payer: Self-pay | Admitting: Internal Medicine

## 2016-09-19 ENCOUNTER — Ambulatory Visit (HOSPITAL_COMMUNITY)
Admission: RE | Admit: 2016-09-19 | Discharge: 2016-09-19 | Disposition: A | Discharge status: Home / Self Care | Payer: Medicare Other | Source: Ambulatory Visit | Attending: Internal Medicine | Admitting: Internal Medicine

## 2016-09-19 DIAGNOSIS — R937 Abnormal findings on diagnostic imaging of other parts of musculoskeletal system: Secondary | ICD-10-CM | POA: Diagnosis not present

## 2016-09-19 DIAGNOSIS — W19XXXA Unspecified fall, initial encounter: Secondary | ICD-10-CM

## 2016-09-19 DIAGNOSIS — X58XXXA Exposure to other specified factors, initial encounter: Secondary | ICD-10-CM | POA: Insufficient documentation

## 2016-09-19 DIAGNOSIS — S6991XA Unspecified injury of right wrist, hand and finger(s), initial encounter: Secondary | ICD-10-CM | POA: Diagnosis not present

## 2016-09-19 DIAGNOSIS — M7989 Other specified soft tissue disorders: Secondary | ICD-10-CM | POA: Diagnosis not present

## 2016-10-11 DIAGNOSIS — C61 Malignant neoplasm of prostate: Secondary | ICD-10-CM | POA: Diagnosis not present

## 2016-10-15 ENCOUNTER — Other Ambulatory Visit (HOSPITAL_COMMUNITY)
Admission: RE | Admit: 2016-10-15 | Discharge: 2016-10-15 | Disposition: A | Discharge status: Home / Self Care | Payer: Medicare Other | Source: Ambulatory Visit | Attending: Urology | Admitting: Urology

## 2016-10-15 ENCOUNTER — Ambulatory Visit (INDEPENDENT_AMBULATORY_CARE_PROVIDER_SITE_OTHER): Payer: Medicare Other | Admitting: Urology

## 2016-10-15 DIAGNOSIS — N390 Urinary tract infection, site not specified: Secondary | ICD-10-CM | POA: Diagnosis not present

## 2016-10-15 DIAGNOSIS — C61 Malignant neoplasm of prostate: Secondary | ICD-10-CM | POA: Diagnosis not present

## 2016-10-15 DIAGNOSIS — D414 Neoplasm of uncertain behavior of bladder: Secondary | ICD-10-CM | POA: Diagnosis not present

## 2016-10-17 LAB — URINE CULTURE

## 2016-12-13 DIAGNOSIS — I872 Venous insufficiency (chronic) (peripheral): Secondary | ICD-10-CM | POA: Diagnosis not present

## 2016-12-13 DIAGNOSIS — G894 Chronic pain syndrome: Secondary | ICD-10-CM | POA: Diagnosis not present

## 2016-12-13 DIAGNOSIS — L03115 Cellulitis of right lower limb: Secondary | ICD-10-CM | POA: Diagnosis not present

## 2016-12-13 DIAGNOSIS — E1129 Type 2 diabetes mellitus with other diabetic kidney complication: Secondary | ICD-10-CM | POA: Diagnosis not present

## 2016-12-13 DIAGNOSIS — R600 Localized edema: Secondary | ICD-10-CM | POA: Diagnosis not present

## 2016-12-13 DIAGNOSIS — E118 Type 2 diabetes mellitus with unspecified complications: Secondary | ICD-10-CM | POA: Diagnosis not present

## 2016-12-13 DIAGNOSIS — Z1389 Encounter for screening for other disorder: Secondary | ICD-10-CM | POA: Diagnosis not present

## 2016-12-13 DIAGNOSIS — D649 Anemia, unspecified: Secondary | ICD-10-CM | POA: Diagnosis not present

## 2016-12-24 ENCOUNTER — Ambulatory Visit (INDEPENDENT_AMBULATORY_CARE_PROVIDER_SITE_OTHER): Payer: Medicare Other | Admitting: Urology

## 2016-12-24 DIAGNOSIS — C61 Malignant neoplasm of prostate: Secondary | ICD-10-CM

## 2016-12-24 DIAGNOSIS — R32 Unspecified urinary incontinence: Secondary | ICD-10-CM

## 2016-12-24 DIAGNOSIS — N320 Bladder-neck obstruction: Secondary | ICD-10-CM | POA: Diagnosis not present

## 2016-12-31 ENCOUNTER — Ambulatory Visit (INDEPENDENT_AMBULATORY_CARE_PROVIDER_SITE_OTHER): Payer: Medicare Other | Admitting: Urology

## 2016-12-31 DIAGNOSIS — C61 Malignant neoplasm of prostate: Secondary | ICD-10-CM

## 2016-12-31 DIAGNOSIS — N320 Bladder-neck obstruction: Secondary | ICD-10-CM

## 2016-12-31 DIAGNOSIS — R32 Unspecified urinary incontinence: Secondary | ICD-10-CM

## 2017-01-09 ENCOUNTER — Encounter: Payer: Self-pay | Admitting: Internal Medicine

## 2017-01-14 ENCOUNTER — Ambulatory Visit (INDEPENDENT_AMBULATORY_CARE_PROVIDER_SITE_OTHER): Payer: Medicare Other | Admitting: Urology

## 2017-01-14 ENCOUNTER — Other Ambulatory Visit (HOSPITAL_COMMUNITY)
Admission: RE | Admit: 2017-01-14 | Discharge: 2017-01-14 | Disposition: A | Discharge status: Home / Self Care | Payer: Medicare Other | Source: Other Acute Inpatient Hospital | Attending: Urology | Admitting: Urology

## 2017-01-14 DIAGNOSIS — N390 Urinary tract infection, site not specified: Secondary | ICD-10-CM | POA: Diagnosis not present

## 2017-01-14 DIAGNOSIS — R32 Unspecified urinary incontinence: Secondary | ICD-10-CM | POA: Diagnosis not present

## 2017-01-14 DIAGNOSIS — N3000 Acute cystitis without hematuria: Secondary | ICD-10-CM

## 2017-01-17 LAB — URINE CULTURE: Culture: >=100000 — AB

## 2017-02-04 ENCOUNTER — Ambulatory Visit (INDEPENDENT_AMBULATORY_CARE_PROVIDER_SITE_OTHER): Payer: Medicare Other | Admitting: Urology

## 2017-02-04 DIAGNOSIS — N320 Bladder-neck obstruction: Secondary | ICD-10-CM

## 2017-02-04 DIAGNOSIS — Z8546 Personal history of malignant neoplasm of prostate: Secondary | ICD-10-CM | POA: Diagnosis not present

## 2017-02-04 DIAGNOSIS — N3000 Acute cystitis without hematuria: Secondary | ICD-10-CM | POA: Diagnosis not present

## 2017-03-03 DIAGNOSIS — E119 Type 2 diabetes mellitus without complications: Secondary | ICD-10-CM | POA: Diagnosis not present

## 2017-03-03 DIAGNOSIS — R600 Localized edema: Secondary | ICD-10-CM | POA: Diagnosis not present

## 2017-03-03 DIAGNOSIS — L03115 Cellulitis of right lower limb: Secondary | ICD-10-CM | POA: Diagnosis not present

## 2017-03-03 DIAGNOSIS — I872 Venous insufficiency (chronic) (peripheral): Secondary | ICD-10-CM | POA: Diagnosis not present

## 2017-03-03 DIAGNOSIS — Z1389 Encounter for screening for other disorder: Secondary | ICD-10-CM | POA: Diagnosis not present

## 2017-03-04 ENCOUNTER — Ambulatory Visit: Payer: Self-pay | Admitting: Urology

## 2017-04-18 ENCOUNTER — Emergency Department (HOSPITAL_COMMUNITY): Payer: Medicare Other

## 2017-04-18 ENCOUNTER — Inpatient Hospital Stay (HOSPITAL_COMMUNITY)
Admission: EM | Admit: 2017-04-18 | Discharge: 2017-05-01 | DRG: 853 | Disposition: A | Discharge status: Skilled Nursing Facility | Payer: Medicare Other | Attending: Family Medicine | Admitting: Family Medicine

## 2017-04-18 ENCOUNTER — Encounter (HOSPITAL_COMMUNITY): Payer: Self-pay | Admitting: Emergency Medicine

## 2017-04-18 DIAGNOSIS — R109 Unspecified abdominal pain: Secondary | ICD-10-CM

## 2017-04-18 DIAGNOSIS — I129 Hypertensive chronic kidney disease with stage 1 through stage 4 chronic kidney disease, or unspecified chronic kidney disease: Secondary | ICD-10-CM | POA: Diagnosis not present

## 2017-04-18 DIAGNOSIS — L0390 Cellulitis, unspecified: Secondary | ICD-10-CM | POA: Diagnosis not present

## 2017-04-18 DIAGNOSIS — E872 Acidosis, unspecified: Secondary | ICD-10-CM

## 2017-04-18 DIAGNOSIS — R195 Other fecal abnormalities: Secondary | ICD-10-CM

## 2017-04-18 DIAGNOSIS — Z433 Encounter for attention to colostomy: Secondary | ICD-10-CM | POA: Diagnosis not present

## 2017-04-18 DIAGNOSIS — K922 Gastrointestinal hemorrhage, unspecified: Secondary | ICD-10-CM | POA: Diagnosis not present

## 2017-04-18 DIAGNOSIS — I248 Other forms of acute ischemic heart disease: Secondary | ICD-10-CM | POA: Diagnosis not present

## 2017-04-18 DIAGNOSIS — K219 Gastro-esophageal reflux disease without esophagitis: Secondary | ICD-10-CM | POA: Diagnosis present

## 2017-04-18 DIAGNOSIS — K610 Anal abscess: Secondary | ICD-10-CM | POA: Diagnosis not present

## 2017-04-18 DIAGNOSIS — E1122 Type 2 diabetes mellitus with diabetic chronic kidney disease: Secondary | ICD-10-CM | POA: Diagnosis not present

## 2017-04-18 DIAGNOSIS — Z23 Encounter for immunization: Secondary | ICD-10-CM

## 2017-04-18 DIAGNOSIS — B9562 Methicillin resistant Staphylococcus aureus infection as the cause of diseases classified elsewhere: Secondary | ICD-10-CM

## 2017-04-18 DIAGNOSIS — R6520 Severe sepsis without septic shock: Secondary | ICD-10-CM | POA: Diagnosis not present

## 2017-04-18 DIAGNOSIS — Z96653 Presence of artificial knee joint, bilateral: Secondary | ICD-10-CM | POA: Diagnosis not present

## 2017-04-18 DIAGNOSIS — D72829 Elevated white blood cell count, unspecified: Secondary | ICD-10-CM

## 2017-04-18 DIAGNOSIS — N4289 Other specified disorders of prostate: Secondary | ICD-10-CM | POA: Diagnosis not present

## 2017-04-18 DIAGNOSIS — Z48815 Encounter for surgical aftercare following surgery on the digestive system: Secondary | ICD-10-CM | POA: Diagnosis not present

## 2017-04-18 DIAGNOSIS — M7989 Other specified soft tissue disorders: Secondary | ICD-10-CM | POA: Diagnosis not present

## 2017-04-18 DIAGNOSIS — F1020 Alcohol dependence, uncomplicated: Secondary | ICD-10-CM | POA: Diagnosis present

## 2017-04-18 DIAGNOSIS — R579 Shock, unspecified: Secondary | ICD-10-CM | POA: Diagnosis not present

## 2017-04-18 DIAGNOSIS — R262 Difficulty in walking, not elsewhere classified: Secondary | ICD-10-CM | POA: Diagnosis not present

## 2017-04-18 DIAGNOSIS — R197 Diarrhea, unspecified: Secondary | ICD-10-CM | POA: Diagnosis not present

## 2017-04-18 DIAGNOSIS — R7881 Bacteremia: Secondary | ICD-10-CM | POA: Diagnosis not present

## 2017-04-18 DIAGNOSIS — L03115 Cellulitis of right lower limb: Secondary | ICD-10-CM | POA: Diagnosis present

## 2017-04-18 DIAGNOSIS — E876 Hypokalemia: Secondary | ICD-10-CM | POA: Diagnosis not present

## 2017-04-18 DIAGNOSIS — I10 Essential (primary) hypertension: Secondary | ICD-10-CM | POA: Diagnosis not present

## 2017-04-18 DIAGNOSIS — R488 Other symbolic dysfunctions: Secondary | ICD-10-CM | POA: Diagnosis not present

## 2017-04-18 DIAGNOSIS — R410 Disorientation, unspecified: Secondary | ICD-10-CM | POA: Diagnosis not present

## 2017-04-18 DIAGNOSIS — M6281 Muscle weakness (generalized): Secondary | ICD-10-CM | POA: Diagnosis not present

## 2017-04-18 DIAGNOSIS — E870 Hyperosmolality and hypernatremia: Secondary | ICD-10-CM | POA: Diagnosis not present

## 2017-04-18 DIAGNOSIS — D62 Acute posthemorrhagic anemia: Secondary | ICD-10-CM | POA: Diagnosis not present

## 2017-04-18 DIAGNOSIS — I361 Nonrheumatic tricuspid (valve) insufficiency: Secondary | ICD-10-CM | POA: Diagnosis not present

## 2017-04-18 DIAGNOSIS — A09 Infectious gastroenteritis and colitis, unspecified: Secondary | ICD-10-CM | POA: Diagnosis not present

## 2017-04-18 DIAGNOSIS — E1151 Type 2 diabetes mellitus with diabetic peripheral angiopathy without gangrene: Secondary | ICD-10-CM | POA: Diagnosis present

## 2017-04-18 DIAGNOSIS — A4101 Sepsis due to Methicillin susceptible Staphylococcus aureus: Principal | ICD-10-CM | POA: Diagnosis present

## 2017-04-18 DIAGNOSIS — Z8546 Personal history of malignant neoplasm of prostate: Secondary | ICD-10-CM | POA: Diagnosis not present

## 2017-04-18 DIAGNOSIS — N183 Chronic kidney disease, stage 3 (moderate): Secondary | ICD-10-CM | POA: Diagnosis present

## 2017-04-18 DIAGNOSIS — K7460 Unspecified cirrhosis of liver: Secondary | ICD-10-CM | POA: Diagnosis not present

## 2017-04-18 DIAGNOSIS — G9341 Metabolic encephalopathy: Secondary | ICD-10-CM | POA: Diagnosis not present

## 2017-04-18 DIAGNOSIS — K8020 Calculus of gallbladder without cholecystitis without obstruction: Secondary | ICD-10-CM | POA: Diagnosis not present

## 2017-04-18 DIAGNOSIS — N340 Urethral abscess: Secondary | ICD-10-CM | POA: Diagnosis present

## 2017-04-18 DIAGNOSIS — R6521 Severe sepsis with septic shock: Secondary | ICD-10-CM | POA: Diagnosis not present

## 2017-04-18 DIAGNOSIS — S065X9A Traumatic subdural hemorrhage with loss of consciousness of unspecified duration, initial encounter: Secondary | ICD-10-CM

## 2017-04-18 DIAGNOSIS — A419 Sepsis, unspecified organism: Secondary | ICD-10-CM

## 2017-04-18 DIAGNOSIS — Z792 Long term (current) use of antibiotics: Secondary | ICD-10-CM | POA: Diagnosis not present

## 2017-04-18 DIAGNOSIS — Z923 Personal history of irradiation: Secondary | ICD-10-CM | POA: Diagnosis not present

## 2017-04-18 DIAGNOSIS — L02215 Cutaneous abscess of perineum: Secondary | ICD-10-CM | POA: Diagnosis present

## 2017-04-18 DIAGNOSIS — R404 Transient alteration of awareness: Secondary | ICD-10-CM | POA: Diagnosis not present

## 2017-04-18 DIAGNOSIS — R4182 Altered mental status, unspecified: Secondary | ICD-10-CM | POA: Diagnosis not present

## 2017-04-18 DIAGNOSIS — N360 Urethral fistula: Secondary | ICD-10-CM | POA: Diagnosis not present

## 2017-04-18 DIAGNOSIS — D649 Anemia, unspecified: Secondary | ICD-10-CM

## 2017-04-18 DIAGNOSIS — N321 Vesicointestinal fistula: Secondary | ICD-10-CM | POA: Diagnosis not present

## 2017-04-18 DIAGNOSIS — S065XAA Traumatic subdural hemorrhage with loss of consciousness status unknown, initial encounter: Secondary | ICD-10-CM

## 2017-04-18 DIAGNOSIS — K591 Functional diarrhea: Secondary | ICD-10-CM | POA: Diagnosis not present

## 2017-04-18 DIAGNOSIS — C61 Malignant neoplasm of prostate: Secondary | ICD-10-CM | POA: Diagnosis not present

## 2017-04-18 DIAGNOSIS — N179 Acute kidney failure, unspecified: Secondary | ICD-10-CM | POA: Diagnosis present

## 2017-04-18 DIAGNOSIS — R531 Weakness: Secondary | ICD-10-CM | POA: Diagnosis not present

## 2017-04-18 DIAGNOSIS — D638 Anemia in other chronic diseases classified elsewhere: Secondary | ICD-10-CM | POA: Diagnosis present

## 2017-04-18 DIAGNOSIS — K921 Melena: Secondary | ICD-10-CM | POA: Diagnosis not present

## 2017-04-18 DIAGNOSIS — Z87891 Personal history of nicotine dependence: Secondary | ICD-10-CM | POA: Diagnosis not present

## 2017-04-18 DIAGNOSIS — K909 Intestinal malabsorption, unspecified: Secondary | ICD-10-CM | POA: Diagnosis not present

## 2017-04-18 DIAGNOSIS — B182 Chronic viral hepatitis C: Secondary | ICD-10-CM | POA: Diagnosis present

## 2017-04-18 DIAGNOSIS — I959 Hypotension, unspecified: Secondary | ICD-10-CM | POA: Diagnosis not present

## 2017-04-18 DIAGNOSIS — K625 Hemorrhage of anus and rectum: Secondary | ICD-10-CM | POA: Diagnosis not present

## 2017-04-18 DIAGNOSIS — N189 Chronic kidney disease, unspecified: Secondary | ICD-10-CM | POA: Diagnosis not present

## 2017-04-18 LAB — BLOOD GAS, ARTERIAL
Drawn by: 22223
FIO2: 21
O2 Saturation: 97.6 %
PATIENT TEMPERATURE: 35.4
pH, Arterial: 7.266 — ABNORMAL LOW (ref 7.350–7.450)
pO2, Arterial: 129 mmHg — ABNORMAL HIGH (ref 83.0–108.0)

## 2017-04-18 LAB — COMPREHENSIVE METABOLIC PANEL
ALT: 28 U/L (ref 17–63)
AST: 50 U/L — AB (ref 15–41)
Albumin: 2.7 g/dL — ABNORMAL LOW (ref 3.5–5.0)
Alkaline Phosphatase: 223 U/L — ABNORMAL HIGH (ref 38–126)
BUN: 140 mg/dL — AB (ref 6–20)
CHLORIDE: 108 mmol/L (ref 101–111)
CO2: <7 mmol/L — ABNORMAL LOW (ref 22–32)
Calcium: 8.9 mg/dL (ref 8.9–10.3)
Creatinine, Ser: 4.26 mg/dL — ABNORMAL HIGH (ref 0.61–1.24)
GFR calc Af Amer: 14 mL/min — ABNORMAL LOW (ref >60)
GFR, EST NON AFRICAN AMERICAN: 12 mL/min — AB (ref >60)
Glucose, Bld: 131 mg/dL — ABNORMAL HIGH (ref 65–99)
POTASSIUM: 3 mmol/L — AB (ref 3.5–5.1)
Sodium: 137 mmol/L (ref 135–145)
Total Bilirubin: 0.7 mg/dL (ref 0.3–1.2)
Total Protein: 7.7 g/dL (ref 6.5–8.1)

## 2017-04-18 LAB — CBC WITH DIFFERENTIAL/PLATELET
BASOS ABS: 0 10*3/uL (ref 0.0–0.1)
Basophils Relative: 0 %
EOS ABS: 0 10*3/uL (ref 0.0–0.7)
EOS PCT: 0 %
HCT: 26.3 % — ABNORMAL LOW (ref 39.0–52.0)
Hemoglobin: 8.8 g/dL — ABNORMAL LOW (ref 13.0–17.0)
LYMPHS PCT: 3 %
Lymphs Abs: 1.3 10*3/uL (ref 0.7–4.0)
MCH: 27.7 pg (ref 26.0–34.0)
MCHC: 33.5 g/dL (ref 30.0–36.0)
MCV: 82.7 fL (ref 78.0–100.0)
MONO ABS: 0.7 10*3/uL (ref 0.1–1.0)
Monocytes Relative: 2 %
Neutro Abs: 39.3 10*3/uL — ABNORMAL HIGH (ref 1.7–7.7)
Neutrophils Relative %: 95 %
PLATELETS: 404 10*3/uL — AB (ref 150–400)
RBC: 3.18 MIL/uL — AB (ref 4.22–5.81)
RDW: 22.7 % — AB (ref 11.5–15.5)
WBC: 41.2 10*3/uL — AB (ref 4.0–10.5)

## 2017-04-18 LAB — AMMONIA: Ammonia: 92 umol/L — ABNORMAL HIGH (ref 9–35)

## 2017-04-18 LAB — PROTIME-INR
INR: 1.26
PROTHROMBIN TIME: 15.7 s — AB (ref 11.4–15.2)

## 2017-04-18 LAB — TROPONIN I: TROPONIN I: 0.04 ng/mL — AB (ref <0.03)

## 2017-04-18 LAB — TYPE AND SCREEN
ABO/RH(D): O POS
ANTIBODY SCREEN: NEGATIVE

## 2017-04-18 LAB — LACTIC ACID, PLASMA
LACTIC ACID, VENOUS: 1.2 mmol/L (ref 0.5–1.9)
LACTIC ACID, VENOUS: 0.9 mmol/L (ref 0.5–1.9)

## 2017-04-18 LAB — ETHANOL: Alcohol, Ethyl (B): <10 mg/dL (ref <10)

## 2017-04-18 LAB — C DIFFICILE QUICK SCREEN W PCR REFLEX
C DIFFICILE (CDIFF) INTERP: NOT DETECTED
C DIFFICILE (CDIFF) TOXIN: NEGATIVE
C DIFFICLE (CDIFF) ANTIGEN: NEGATIVE

## 2017-04-18 MED ORDER — SODIUM BICARBONATE 8.4 % IV SOLN
INTRAVENOUS | Status: DC
  Administered 2017-04-19 – 2017-04-20 (×3): via INTRAVENOUS
  Filled 2017-04-18 (×7): qty 150

## 2017-04-18 MED ORDER — SODIUM CHLORIDE 0.9 % IV BOLUS (SEPSIS)
500.0000 mL | Freq: Once | INTRAVENOUS | Status: DC
Start: 2017-04-18 — End: 2017-04-21

## 2017-04-18 MED ORDER — SODIUM CHLORIDE 0.9 % IV BOLUS (SEPSIS)
1000.0000 mL | Freq: Once | INTRAVENOUS | Status: AC
  Administered 2017-04-18: 1000 mL via INTRAVENOUS

## 2017-04-18 MED ORDER — SODIUM CHLORIDE 0.9 % IV BOLUS (SEPSIS)
500.0000 mL | Freq: Once | INTRAVENOUS | Status: AC
  Administered 2017-04-18: 500 mL via INTRAVENOUS

## 2017-04-18 MED ORDER — SODIUM CHLORIDE 0.9 % IV SOLN
1.0000 g | Freq: Once | INTRAVENOUS | Status: AC
  Administered 2017-04-18: 1 g via INTRAVENOUS
  Filled 2017-04-18: qty 1

## 2017-04-18 MED ORDER — SODIUM BICARBONATE 8.4 % IV SOLN
INTRAVENOUS | Status: AC
  Filled 2017-04-18: qty 150

## 2017-04-18 MED ORDER — VANCOMYCIN HCL IN DEXTROSE 1-5 GM/200ML-% IV SOLN
1000.0000 mg | Freq: Once | INTRAVENOUS | Status: AC
  Administered 2017-04-18: 1000 mg via INTRAVENOUS
  Filled 2017-04-18: qty 200

## 2017-04-18 MED ORDER — SODIUM CHLORIDE 0.9 % IV SOLN: INTRAVENOUS | Status: DC

## 2017-04-18 NOTE — ED Notes (Signed)
Blanket warmer applied

## 2017-04-18 NOTE — ED Triage Notes (Signed)
Pt from home. Black diarrhea, confusion and ALOC per son who called EMS.

## 2017-04-18 NOTE — ED Notes (Signed)
Pt cleaned, has black dried stool all over legs and buttockws.

## 2017-04-18 NOTE — Progress Notes (Signed)
Patient ID: Colin [name], male   DOB: 1941/01/15, 76 y.o.   MRN: [id number]  Called to evaluate head CT on this gentleman with AMS and leukocytosis. He has a very benign appearing left hypodense fluid collection in the subdural space that really causes no mass effect or shift or even flattening of the gyral sulcal pattern. This is likely old and under no pressure. I think this unrelated to his current problem. Would repeat scan in the next week or so to see if there are any changes. Please let us know if we can be of further assistance.

## 2017-04-18 NOTE — ED Notes (Signed)
Panic values taken from respiratory- read to Dr Lita Mains  POC: Colin [name]   PH 7.266 CO2 below reportable range PO2 129 HCO3 below reportable range Sats 97.6 Hgb 11.4

## 2017-04-18 NOTE — ED Notes (Signed)
Date and time results received: 04/18/17 8:16 PM  Test: troponins  Critical Value: 0.04  Name of Provider Notified: Thurnell Garbe  Orders Received? Or Actions Taken?:no further orders at this time.

## 2017-04-18 NOTE — ED Provider Notes (Signed)
[facility] DEPT Provider Note   CSN: [id number] Arrival date & time: 04/18/17  1830     History   Chief Complaint Chief Complaint  Patient presents with  . Diarrhea    HPI Colin [name] is a 76 y.o. male.  The history is provided by the patient, a relative and the EMS personnel. The history is limited by the condition of the patient (AMS).  Diarrhea    Pt was seen at [place].  Per EMS and family report: Pt with increasing confusion and "black diarrhea" for the past 1 month, worse over the past 3 days. EMS states pt lives alone, house was soiled with feces. Pt was found disheveled, confused, wearing soiled clothes. Pt's baseline is usually AAOx3, independent, well-kept person and house per pt's son. Pt himself denies any complaints. Denies abd pain, no N/V, no back pain, no CP/SOB.   Past Medical History:  Diagnosis Date  . Arthritis   . Bilateral lower extremity edema   . Chronic venous insufficiency   . CKD (chronic kidney disease), stage III ([place])   . History of alcohol abuse    none since 2013 per pt  . History of chronic hepatitis    Genotype 1, FO/F1-- treated w/ harvoni (completed May 2016)  . History of external beam radiation therapy    2007 prostate  . History of intravenous drug use in remission    per pt in remission since 1980's  . HOH (hard of hearing)   . Hypertension   . Normocytic anemia   . Prostate cancer ([place])   . Recurrent prostate cancer [facility]) urologist-  dr Junious Silk   dx 2007  s/p  external beam radioation therapy/ recurrent 12-2015  Gleason 4+4  . Type 2 diabetes, diet controlled ([place])    no meds since 2010 approx    Patient Active Problem List   Diagnosis Date Noted  . 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 Normocytic anemia 05/16/2014  . Colon cancer screening 01/14/2014  . Lymphedema 02/25/2013  . Leg edema, right 05/04/2012  . Varicose veins of lower extremities with other complications 20/94/7096  . Hepatitis C 08/28/2011  .  HYPERTENSION 01/10/2010  . NEOPLASM, MALIGNANT, PROSTATE, HX OF 01/10/2010  . DIABETES MELLITUS, TYPE II 01/04/2010  . 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    Past Surgical History:  Procedure Laterality Date  . CATARACT EXTRACTION W/PHACO Left 12/14/2012   Procedure: CATARACT EXTRACTION PHACO AND INTRAOCULAR LENS PLACEMENT (IOC);  Surgeon: Tonny Branch, MD;  Location: [facility];  Service: Ophthalmology;  Laterality: Left;  CDE: 13.74  . CATARACT EXTRACTION W/PHACO Right 12/28/2012   Procedure: CATARACT EXTRACTION PHACO AND INTRAOCULAR LENS PLACEMENT (IOC);  Surgeon: Tonny Branch, MD;  Location: [facility];  Service: Ophthalmology;  Laterality: Right;  CDE:12.80  . COLONOSCOPY N/A 02/14/2014   GEZ:MOQHUTML colonic polyps-removed as described above Colonic diverticulosis (tubular adenoma)  . CRYOABLATION N/A 05/24/2016   Procedure: CRYO ABLATION PROSTATE;  Surgeon: Festus Aloe, MD;  Location: [facility];  Service: Urology;  Laterality: N/A;  . ENDOVENOUS ABLATION SAPHENOUS VEIN W/ LASER  2013   Right leg X 2 by Dr. Debara Pickett  . ESOPHAGOGASTRODUODENOSCOPY N/A 02/14/2014   [id number] large pedunculated gastric polyp (hyperplastic)  . REIMPLANTATION OF TOTAL KNEE Left 03/16/2010  . TONSILLECTOMY  child  . TOTAL KNEE  PROSTHESIS REMOVAL W/ SPACER INSERTION Left 12/29/2009  . TOTAL KNEE ARTHROPLASTY Bilateral left 2007/   right 2005 approx       Home Medications  Prior to Admission medications   Medication Sig Start Date End Date Taking? Authorizing Provider  cephALEXin (KEFLEX) 500 MG capsule Take 1 capsule (500 mg total) by mouth 2 (two) times daily. 05/24/16   Festus Aloe, MD  docusate sodium (COLACE) 100 MG capsule Take 1 capsule (100 mg total) by mouth 2 (two) times daily. 05/24/16   Festus Aloe, MD  HYDROcodone-acetaminophen (LORCET HD) 10-325 MG per tablet Take 1 tablet by mouth every 6 (six) hours as needed for pain.    [provider]  Multiple  Vitamins-Minerals (MULTIVITAMIN WITH MINERALS) tablet Take 1 tablet by mouth daily.      [provider]  tamsulosin (FLOMAX) 0.4 MG CAPS capsule Take 1 capsule (0.4 mg total) by mouth daily after supper. 05/24/16   Festus Aloe, MD  torsemide (DEMADEX) 20 MG tablet Take 3 tablets by mouth 2 (two) times daily.  12/28/13   [provider]  traMADol (ULTRAM) 50 MG tablet Take 1 tablet (50 mg total) by mouth every 6 (six) hours as needed for moderate pain. 05/24/16   Festus Aloe, MD    Family History Family History  Problem Relation Age of Onset  . Colon cancer Neg Hx   . Liver disease Neg Hx     Social History Social History  Substance Use Topics  . Smoking status: Former Smoker    Years: 12.00    Quit date: 05/04/1972  . Smokeless tobacco: Never Used  . Alcohol use No     Comment: hx alcohol abuse-- per pt  none since 2013.     Allergies   Patient has no known allergies.   Review of Systems Review of Systems  Unable to perform ROS: Mental status change  Gastrointestinal: Positive for diarrhea.     Physical Exam Updated Vital Signs BP 107/70 (BP Location: Left Arm)   Pulse 88   Temp (!) 93 F (33.9 C) (Rectal)   Resp 20   SpO2 95%    Patient Vitals for the past 24 hrs:  BP Temp Temp src Pulse Resp SpO2  04/18/17 2230 109/69 (!) 95.7 F (35.4 C) - - 15 -  04/18/17 2200 100/65 (!) 95.4 F (35.2 C) - 88 14 100 %  04/18/17 1900 107/69 - - - 11 -  04/18/17 1838 107/70 (!) 93 F (33.9 C) Rectal 88 20 95 %      Physical Exam 1840: Physical examination:  Nursing notes reviewed; Vital signs and O2 SAT reviewed;  Constitutional: Thin, Disheveled, poor hygiene. In no acute distress; Head:  Normocephalic, atraumatic; Eyes: EOMI, PERRL, No scleral icterus; ENMT: Mouth and pharynx normal, Mucous membranes dry; Neck: Supple, Full range of motion, No lymphadenopathy; Cardiovascular: Regular rate and rhythm, No gallop; Respiratory: Breath sounds  clear & equal bilaterally, No wheezes.  Speaking full sentences with ease, Normal respiratory effort/excursion; Chest: Nontender, Movement normal; Abdomen: Soft, Nontender, Nondistended, Normal bowel sounds. Rectal exam performed w/permission of pt and ED RN chaperone present.  Anal tone normal.  Non-tender, soft black stool in rectal vault, heme positive.  No fissures, no external hemorrhoids, no palp masses.;;; Genitourinary: No CVA tenderness; Extremities: Pulses normal, No tenderness, +2 RLE > LLE edema. Dried black stool on buttocks, legs, and feet..; Neuro: Awake, alert, confused re: time, events.  +very HOH, major CN otherwise grossly intact. No facial droop. Speech clear. Moves all extremities spontaneously and to command without apparent gross focal motor deficits.; Skin: Color normal, Warm, Dry.    ED Treatments / Results  Labs (all labs ordered are listed, but only abnormal results are displayed)    EKG  EKG Interpretation  Date/Time:  Friday April 18 2017 20:58:17 EDT Ventricular Rate:  93 PR Interval:    QRS Duration: 82 QT Interval:  376 QTC Calculation: 468 R Axis:   -16 Text Interpretation:  Sinus rhythm Prolonged PR interval Borderline left axis deviation Low voltage, precordial leads Abnormal R-wave progression, early transition Baseline wander When compared with ECG of 05/24/2016 PR interval has increased Confirmed by Francine Graven [phone]) on 04/18/2017 10:34:42 PM       Radiology   Procedures Procedures (including critical care time)  Medications Ordered in ED Medications - No data to display   Initial Impression / Assessment and Plan / ED Course  I have reviewed the triage vital signs and the nursing notes.  Pertinent labs & imaging results that were available during my care of the patient were reviewed by me and considered in my medical decision making (see chart for details).  MDM Reviewed: previous chart, nursing note and vitals Reviewed  previous: labs and ECG Interpretation: labs, ECG, x-ray and CT scan Total time providing critical care: 30-74 minutes. This excludes time spent performing separately reportable procedures and services. Consults: neurosurgery, urology, critical care and general surgery (and Nephrology)   CRITICAL CARE Performed by: Alfonzo Feller Total critical care time: 70 minutes Critical care time was exclusive of separately billable procedures and treating other patients. Critical care was necessary to treat or prevent imminent or life-threatening deterioration. Critical care was time spent personally by me on the following activities: development of treatment plan with patient and/or surrogate as well as nursing, discussions with consultants, evaluation of patient's response to treatment, examination of patient, obtaining history from patient or surrogate, ordering and performing treatments and interventions, ordering and review of laboratory studies, ordering and review of radiographic studies, pulse oximetry and re-evaluation of patient's condition.   Results for orders placed or performed during the hospital encounter of 04/18/17  C difficile quick scan w PCR reflex  Result Value Ref Range   C Diff antigen NEGATIVE NEGATIVE   C Diff toxin NEGATIVE NEGATIVE   C Diff interpretation No C. difficile detected.   CBC with Differential  Result Value Ref Range   WBC 41.2 (H) 4.0 - 10.5 K/uL   RBC 3.18 (L) 4.22 - 5.81 MIL/uL   Hemoglobin 8.8 (L) 13.0 - 17.0 g/dL   HCT 26.3 (L) 39.0 - 52.0 %   MCV 82.7 78.0 - 100.0 fL   MCH 27.7 26.0 - 34.0 pg   MCHC 33.5 30.0 - 36.0 g/dL   RDW 22.7 (H) 11.5 - 15.5 %   Platelets 404 (H) 150 - 400 K/uL   Neutrophils Relative % 95 %   Neutro Abs 39.3 (H) 1.7 - 7.7 K/uL   Lymphocytes Relative 3 %   Lymphs Abs 1.3 0.7 - 4.0 K/uL   Monocytes Relative 2 %   Monocytes Absolute 0.7 0.1 - 1.0 K/uL   Eosinophils Relative 0 %   Eosinophils Absolute 0.0 0.0 - 0.7 K/uL    Basophils Relative 0 %   Basophils Absolute 0.0 0.0 - 0.1 K/uL  Comprehensive metabolic panel  Result Value Ref Range   Sodium 137 135 - 145 mmol/L   Potassium 3.0 (L) 3.5 - 5.1 mmol/L   Chloride 108 101 - 111 mmol/L   CO2 <7 (L) 22 - 32 mmol/L   Glucose, Bld 131 (H) 65 - 99 mg/dL   BUN  140 (H) 6 - 20 mg/dL   Creatinine, Ser 4.26 (H) 0.61 - 1.24 mg/dL   Calcium 8.9 8.9 - 10.3 mg/dL   Total Protein 7.7 6.5 - 8.1 g/dL   Albumin 2.7 (L) 3.5 - 5.0 g/dL   AST 50 (H) 15 - 41 U/L   ALT 28 17 - 63 U/L   Alkaline Phosphatase 223 (H) 38 - 126 U/L   Total Bilirubin 0.7 0.3 - 1.2 mg/dL   GFR calc non Af Amer 12 (L) >60 mL/min   GFR calc Af Amer 14 (L) >60 mL/min   Anion gap NOT CALCULATED 5 - 15  Troponin I  Result Value Ref Range   Troponin I 0.04 (HH) <0.03 ng/mL  Lactic acid, plasma  Result Value Ref Range   Lactic Acid, Venous 1.2 0.5 - 1.9 mmol/L  Lactic acid, plasma  Result Value Ref Range   Lactic Acid, Venous 0.9 0.5 - 1.9 mmol/L  Ammonia  Result Value Ref Range   Ammonia 92 (H) 9 - 35 umol/L  Protime-INR  Result Value Ref Range   Prothrombin Time 15.7 (H) 11.4 - 15.2 seconds   INR 1.26   Ethanol  Result Value Ref Range   Alcohol, Ethyl (B) <10 <10 mg/dL  Blood gas, arterial  Result Value Ref Range   FIO2 21.00    O2 Content ROOM AIR L/min   Delivery systems ROOM AIR    pH, Arterial 7.266 (L) 7.350 - 7.450   pCO2 arterial  32.0 - 48.0 mmHg    CRITICAL RESULT CALLED TO, READ BACK BY AND VERIFIED WITH:   pO2, Arterial 129.0 (H) 83.0 - 108.0 mmHg   Bicarbonate  20.0 - 28.0 mmol/L    CRITICAL RESULT CALLED TO, READ BACK BY AND VERIFIED WITH:   O2 Saturation 97.6 %   Patient temperature 35.4    Collection site RIGHT BRACHIAL    Drawn by [id number]    Sample type ARTERIAL    Allens test (pass/fail) NOT INDICATED (A) PASS  Type and screen [facility]  Result Value Ref Range   ABO/RH(D) O POS    Antibody Screen NEG    Sample Expiration 04/21/2017    Ct Abdomen  Pelvis Wo Contrast Result Date: 04/18/2017 CLINICAL DATA:  Black diarrhea. Prostate cancer status post cryoablation and November 2017 EXAM: CT ABDOMEN AND PELVIS WITHOUT CONTRAST TECHNIQUE: Multidetector CT imaging of the abdomen and pelvis was performed following the standard protocol without IV contrast. COMPARISON:  CT 10/13/2015 FINDINGS: Lower chest: Lung bases are clear. Hepatobiliary: No focal hepatic lesion. No biliary duct dilatation. Gallbladder is normal. Common bile duct is normal. Pancreas: Pancreas is normal. No ductal dilatation. No pancreatic inflammation. Spleen: Normal spleen Adrenals/urinary tract: Adrenal glands are normal. Kidneys are normal without obstruction. There is gas within bladder. There is Foley catheter expanded within the prostatic urethra of the prostate gland (image 63, series 6). There is gas within the prostate gland anterior to the Foley bulb in posterior to the bulb surrounding the urethra. Gas-filled track between the anterior wall the rectum and the prostate gland / urethra (image 76, series 2). Small gas within the anterior wall of the rectum (image 74, series 2). Additionally there is a tract leading inferiorly from the prostate gland/ urethra the LEFT perineum (image 85 and 90. Series 2). There small abscess at the base the bladder base the penis measuring 2.9 by 1.7 cm. Stomach/Bowel: Stomach, small-bowel, appendix cecum normal. The colon rectosigmoid colon normal. Gas and anterior  wall the rectum and potential communication with the prostate gland/3 described above. No abscess formation within the pelvis peritoneal or retroperitoneal space. Vascular/Lymphatic: Abdominal aorta is normal caliber. There is no retroperitoneal or periportal lymphadenopathy. No pelvic lymphadenopathy. Reproductive: Prostate gland as described above within the bladder section Other: No free fluid. Musculoskeletal: No aggressive osseous lesion IMPRESSION: 1. 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 2. Extensive gas within the prostate gland. The Foley catheter bulb is expanded within the prostatic urethra. Recommend deflation and advancement of the Foley bulb into the lumen of the bladder. 3. Communication between the prostate gland / prostatic urethra / rectum and the base the penis and LEFT perineum consistent with a fistulous track and abscess formation. 4. Recommend urology consultation. Findings conveyed toKATHLEEN Ozro Russett on 04/18/2017  at21:48. Electronically Signed   By: Suzy Bouchard M.D.   On: 04/18/2017 21:48   Ct Head Wo Contrast Result Date: 04/18/2017 CLINICAL DATA:  Confusion and altered mental status. EXAM: CT HEAD WITHOUT CONTRAST TECHNIQUE: Contiguous axial images were obtained from the base of the skull through the vertex without intravenous contrast. COMPARISON:  None. FINDINGS: Brain: There is an age-indeterminate left frontoparietal subdural hematoma, measuring 10 mm in maximum thickness. No evidence of significant midline shift. No evidence of acute infarction or hydrocephalus. Moderate brain parenchymal volume loss and periventricular microangiopathy. Vascular: No hyperdense vessel or unexpected calcification. Skull: Normal. Negative for fracture or focal lesion. Sinuses/Orbits: No acute finding. Other: None. IMPRESSION: Age-indeterminate left frontoparietal subdural hematoma measuring 10 mm in maximum thickness. No significant midline shift. Moderate brain parenchymal volume loss and periventricular microangiopathy. These results were called by telephone at the time of interpretation on 04/18/2017 at 9:33 pm to Dr. Francine Graven , who verbally acknowledged these results. Electronically Signed   By: Fidela Salisbury M.D.   On: 04/18/2017 21:41   Dg Chest Port 1 View Result Date: 04/18/2017 CLINICAL DATA:  Diarrhea and confusion.  Altered  mental status. EXAM: PORTABLE CHEST 1 VIEW COMPARISON:  12/28/2009 FINDINGS: A single AP portable view of the chest demonstrates no focal airspace consolidation or alveolar edema. The lungs are grossly clear. There is no large effusion or pneumothorax. Cardiac and mediastinal contours appear unremarkable. IMPRESSION: No active disease. Electronically Signed   By: Andreas Newport M.D.   On: 04/18/2017 20:44    2140:  Pt's temp 93 on arrival; warming blanket applied. New ARF on labs, pt appears clinically dehydrated and SBP remains 100-110's; IVF boluses ordered.  Black stool is heme positive. Foley with black liquid return; concern regarding GI-bladder fistula so CT A/P ordered (without contrast given pt's new ARF). Pt remains awake/alert, talking with his son at bedside and ED staff, NAD, resps easy, abd remains benign, moves all extremities spontaneously. No change in neuro status. Given complexity of pt's diagnoses and need for multiple services consults; pt will need to be transferred/admitted to a higher level of care/[facility].   2150:  T/C to Neurosurgery Dr. Ronnald Ramp, case discussed, including:  HPI, pertinent PM/SHx, VS/PE, dx testing, ED course and treatment:  No acute surgical issue at this time. T/C to Uro Dr. Alinda Money, case discussed, including:  HPI, pertinent PM/SHx, VS/PE, dx testing, ED course and treatment:  Agreeable to consult, requests to notify General Surgery they may need to be involved.   2205:  T/C to [facility] PCCM Dr. Chase Caller, case discussed, including:  HPI, pertinent  PM/SHx, VS/PE, dx testing, ED course and treatment:  Agreeable to admit, requests to obtain ABG, and pt will likely need bicarb gtt.  2210:  T/C to [facility] General Surgery Dr. Hulen Skains, case discussed, including:  HPI, pertinent PM/SHx, VS/PE, dx testing, ED course and treatment:  Agreeable to consult prn.   2220:  T/C to [facility] PharmD regarding abx:  Recommends IV vancomycin 1gm and meropenem 1gm for now.  T/C to Nephrology Dr. Justin Mend,  case discussed, including:  HPI, pertinent PM/SHx, VS/PE, dx testing, ED course and treatment:  OK with antibiotic choice as recommended by PharmD at [facility], agreeable to consult.   2230:  Pt's son called with update regarding dx testing, dx, and plan of care above:  Son verb understanding; he now states that pt fell 2 weeks ago and did not seek medical evaluation.   2240:  2nd lactic acid remains normal.  PCCM MD updated regarding ABG and starting of IV bicarb gtt.     Final Clinical Impressions(s) / ED Diagnoses   Final diagnoses:  None    New Prescriptions New Prescriptions   No medications on file      Francine Graven, DO 04/20/17 2249

## 2017-04-18 NOTE — H&P (Addendum)
PULMONARY / CRITICAL CARE MEDICINE   Name: TERREZ ANDER MRN: [id number] DOB: [birth year]    ADMISSION DATE:  04/18/2017 CONSULTATION DATE:  04/19/2017  REFERRING MD:  Dr. Thurnell Garbe   CHIEF COMPLAINT:  Urethral/Rectal Fistula   HISTORY OF PRESENT ILLNESS:   76 year old male with PMH of stage 3 CKD, ETOH, Hepatitis, Hepatitis C, HTN, Anemia, Prostate Cancer, DM type 2   Presents to [facility] ED on 10/12 with reported "black" stool for one month in which has progressively worsened over the last 3 days. Hemoglobin 8.8, WBC 41.2. 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 Foley was placed with black stool output noted. Surgery and Urology consulted. Patient transferred to [facility] for further management.   PAST MEDICAL HISTORY :  He  has a past medical history of Arthritis; Bilateral lower extremity edema; Chronic venous insufficiency; CKD (chronic kidney disease), stage III ([place]); History of alcohol abuse; History of chronic hepatitis; History of external beam radiation therapy; History of intravenous drug use in remission; HOH (hard of hearing); Hypertension; Normocytic anemia; Prostate cancer ([place]); Recurrent prostate cancer [facility]) (urologist-  dr Junious Silk); and Type 2 diabetes, diet controlled ([place]).  PAST SURGICAL HISTORY: He  has a past surgical history that includes Endovenous ablation saphenous vein w/ laser (2013); Cataract extraction w/PHACO (Left, 12/14/2012); Cataract extraction w/PHACO (Right, 12/28/2012); Colonoscopy (N/A, 02/14/2014); Esophagogastroduodenoscopy (N/A, 02/14/2014); Total knee arthroplasty (Bilateral, left 2007/   right 2005 approx); Total knee  prosthesis removal w/ spacer insertion (Left, 12/29/2009); Reimplantation of total knee (Left, 03/16/2010); Tonsillectomy (child); and Cryoablation (N/A, 05/24/2016).  No Known Allergies  No  current facility-administered medications on file prior to encounter.    Current Outpatient Prescriptions on File Prior to Encounter  Medication Sig  . cephALEXin (KEFLEX) 500 MG capsule Take 1 capsule (500 mg total) by mouth 2 (two) times daily.  Marland Kitchen HYDROcodone-acetaminophen (LORCET HD) 10-325 MG per tablet Take 1 tablet by mouth every 6 (six) hours as needed for pain.  . Multiple Vitamins-Minerals (MULTIVITAMIN WITH MINERALS) tablet Take 1 tablet by mouth daily.    Marland Kitchen torsemide (DEMADEX) 20 MG tablet Take 2 tablets by mouth 4 (four) times daily.     FAMILY HISTORY:  His indicated that his mother is deceased. He indicated that his father is deceased. He indicated that his brother is alive. He indicated that the status of his neg hx is unknown.    SOCIAL HISTORY: He  reports that he quit smoking about 44 years ago. He quit after 12.00 years of use. He has never used smokeless tobacco. He reports that he does not drink alcohol or use drugs.  REVIEW OF SYSTEMS:   Unable to review as patient is encephalopathic   SUBJECTIVE:    VITAL SIGNS: BP 109/69   Pulse 88   Temp (!) 95.7 F (35.4 C)   Resp 15   SpO2 100%   HEMODYNAMICS:    VENTILATOR SETTINGS:    INTAKE / OUTPUT: No intake/output data recorded.  PHYSICAL EXAMINATION: General:  Elderly male, no distress  Neuro:  Alert, confused, follows commands  HEENT:  Normocephalic  Cardiovascular:  Tachy, no MRG  Lungs:  Clear breath sounds, non-labored  Abdomen:  Tender, active bowel sounds  Musculoskeletal:  BLE edema, RLE redness and warmth noted  Skin:  Dry, intact   LABS:  BMET  Recent Labs Lab 04/18/17 1909  NA 137  K 3.0*  CL 108  CO2 <7*  BUN 140*  CREATININE 4.26*  GLUCOSE 131*    Electrolytes  Recent Labs Lab 04/18/17 1909  CALCIUM 8.9    CBC  Recent Labs Lab 04/18/17 1909  WBC 41.2*  HGB 8.8*  HCT 26.3*  PLT 404*    Coag's  Recent Labs Lab 04/18/17 1909  INR 1.26    Sepsis  Markers  Recent Labs Lab 04/18/17 1909 04/18/17 2116  LATICACIDVEN 1.2 0.9    ABG  Recent Labs Lab 04/18/17 2230  PHART 7.266*  PCO2ART CRITICAL RESULT CALLED TO, READ BACK BY AND VERIFIED WITH:  PO2ART 129.0*    Liver Enzymes  Recent Labs Lab 04/18/17 1909  AST 50*  ALT 28  ALKPHOS 223*  BILITOT 0.7  ALBUMIN 2.7*    Cardiac Enzymes  Recent Labs Lab 04/18/17 1909  TROPONINI 0.04*    Glucose No results for input(s): GLUCAP in the last 168 hours.  Imaging Ct Abdomen Pelvis Wo Contrast  Result Date: 04/18/2017 CLINICAL DATA:  Black diarrhea. Prostate cancer status post cryoablation and November 2017 EXAM: CT ABDOMEN AND PELVIS WITHOUT CONTRAST TECHNIQUE: Multidetector CT imaging of the abdomen and pelvis was performed following the standard protocol without IV contrast. COMPARISON:  CT 10/13/2015 FINDINGS: Lower chest: Lung bases are clear. Hepatobiliary: No focal hepatic lesion. No biliary duct dilatation. Gallbladder is normal. Common bile duct is normal. Pancreas: Pancreas is normal. No ductal dilatation. No pancreatic inflammation. Spleen: Normal spleen Adrenals/urinary tract: Adrenal glands are normal. Kidneys are normal without obstruction. There is gas within bladder. There is Foley catheter expanded within the prostatic urethra of the prostate gland (image 63, series 6). There is gas within the prostate gland anterior to the Foley bulb in posterior to the bulb surrounding the urethra. Gas-filled track between the anterior wall the rectum and the prostate gland / urethra (image 76, series 2). Small gas within the anterior wall of the rectum (image 74, series 2). Additionally there is a tract leading inferiorly from the prostate gland/ urethra the LEFT perineum (image 85 and 90. Series 2). There small abscess at the base the bladder base the penis measuring 2.9 by 1.7 cm. Stomach/Bowel: Stomach, small-bowel, appendix cecum normal. The colon rectosigmoid colon normal.  Gas and anterior wall the rectum and potential communication with the prostate gland/3 described above. No abscess formation within the pelvis peritoneal or retroperitoneal space. Vascular/Lymphatic: Abdominal aorta is normal caliber. There is no retroperitoneal or periportal lymphadenopathy. No pelvic lymphadenopathy. Reproductive: Prostate gland as described above within the bladder section Other: No free fluid. Musculoskeletal: No aggressive osseous lesion IMPRESSION: 1. 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 2. Extensive gas within the prostate gland. The Foley catheter bulb is expanded within the prostatic urethra. Recommend deflation and advancement of the Foley bulb into the lumen of the bladder. 3. Communication between the prostate gland / prostatic urethra / rectum and the base the penis and LEFT perineum consistent with a fistulous track and abscess formation. 4. Recommend urology consultation. Findings conveyed toKATHLEEN MCMANUS on 04/18/2017  at21:48. Electronically Signed   By: Suzy Bouchard M.D.   On: 04/18/2017 21:48   Ct Head Wo Contrast  Result Date: 04/18/2017 CLINICAL DATA:  Confusion and altered mental status. EXAM: CT HEAD WITHOUT CONTRAST TECHNIQUE: Contiguous axial images were obtained from the base of the skull through the vertex  without intravenous contrast. COMPARISON:  None. FINDINGS: Brain: There is an age-indeterminate left frontoparietal subdural hematoma, measuring 10 mm in maximum thickness. No evidence of significant midline shift. No evidence of acute infarction or hydrocephalus. Moderate brain parenchymal volume loss and periventricular microangiopathy. Vascular: No hyperdense vessel or unexpected calcification. Skull: Normal. Negative for fracture or focal lesion. Sinuses/Orbits: No acute finding. Other: None. IMPRESSION: Age-indeterminate left  frontoparietal subdural hematoma measuring 10 mm in maximum thickness. No significant midline shift. Moderate brain parenchymal volume loss and periventricular microangiopathy. These results were called by telephone at the time of interpretation on 04/18/2017 at 9:33 pm to Dr. Francine Graven , who verbally acknowledged these results. Electronically Signed   By: Fidela Salisbury M.D.   On: 04/18/2017 21:41   Dg Chest Port 1 View  Result Date: 04/18/2017 CLINICAL DATA:  Diarrhea and confusion.  Altered mental status. EXAM: PORTABLE CHEST 1 VIEW COMPARISON:  12/28/2009 FINDINGS: A single AP portable view of the chest demonstrates no focal airspace consolidation or alveolar edema. The lungs are grossly clear. There is no large effusion or pneumothorax. Cardiac and mediastinal contours appear unremarkable. IMPRESSION: No active disease. Electronically Signed   By: Andreas Newport M.D.   On: 04/18/2017 20:44     STUDIES:  CXR 10/12 > The lungs are grossly clear. There is no large effusion or pneumothorax. Cardiac and mediastinal contours appear unremarkable. CT Head 10/12 > Age-indeterminate left frontoparietal subdural hematoma measuring 10 mm in maximum thickness. No significant midline shift. Moderate brain parenchymal volume loss and periventricular microangiopathy. CT A/P 10/12 > 1. 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 2. Extensive gas within the prostate gland. The Foley catheter bulb is expanded within the prostatic urethra. Recommend deflation and advancement of the Foley bulb into the lumen of the bladder. 3. Communication between the prostate gland / prostatic urethra / rectum and the base the penis and LEFT perineum consistent with a fistulous track and abscess formation. 4. Recommend urology consultation  CULTURES: Blood 10/12 >>  Urine 10/12 >>    ANTIBIOTICS: Vancomycin 10/12 >> Meropenem 10/12 >>   SIGNIFICANT EVENTS: 10/12 > Presents to ED   LINES/TUBES: Foley 10/12 >>  DISCUSSION: 76 year old male presents to ED with black tarry stool. Found to have fistula. Transferred to [facility] for further management.   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  GI Bleed  Liver Cirrhosis, Hep C  -C.Diff Negative 10/12  P:   Surgery Consulted  Urology Consulted > Plans to possibly either place suprapubic cath or nephrostomy tubes  ABD U/S Pending  NPO PPI   HEMATOLOGIC A:   Blood Loss Anemia  P:  Trend CBC q6h  Transfuse for Hemoglobin <7  Trend INR   INFECTIOUS A:   Leukocytosis  ?Cellulitis to Right LE  H/O Recent Staph UTI  P:   Trend WBC and Fever Curve  Trend PCT Follow Culture Data  Continue Meropenem and Vancomycin   ENDOCRINE A:   H/O DM  P:   Trend Glucose  SSI   NEUROLOGIC A:   Left Subdural Hematoma  Metabolic Encephalopathy  H/O ETOH (stopped 2013), 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) P:   Neurosurgery Consulted > Believes SDH is old  Monitor  Avoid Sedative Medications   FAMILY  - Updates: no family at bedside   - Inter-disciplinary family meet or Palliative Care meeting due by:  04/26/2017    CC Time: 21 minutes   Hayden Pedro, AGACNP-BC [facility]  Pgr: [phone]  PCCM Pgr: [phone]   STAFF NOTE  I, Dr Seward Carol have personally reviewed patient's  available data, including medical history, events of note, physical examination and test results as part of my evaluation. I have discussed with NP Dewaine Oats and other care providers such as Warren Lacy, pharmacist, RN and RRT.  In addition,  I personally evaluated patient: 76 yr old male with a PMHx Stage 3 CKD, ETOH, Hepatitis C, HTN, Anemia, Prostate Cancer, DM type 2 , that presented from [place] with the following active issues: Sepsis: secondary to UTI (h/o  Staph aureus). Rectourethral fistula that has been evaluated by Urology  Which may require a suprapubic catheter vs. nephrostomy tubes awaiting Urology's input.  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 Plan is as stated above pt is alert and responds but has difficulty hearing, at times responses are not coherent, he continues to follow commands. Continue on Neuro checks and Aspiration precautions. Stable not requiring vasopressors currently on Bicarb ggt due to metabolic derangement. Unreadable CO2 pt not working to breath or tachypneic on my exam. Continues to protect his airway. Given the findings of rectourethral fistula and stool in the foley patient will require Urology and Surgical input/intervention. We appreciate their recommendations    The patient is critically ill with multiple organ systems failure and requires high complexity decision making for assessment and support, frequent evaluation and titration of therapies, application of advanced monitoring technologies and extensive interpretation of multiple databases. Critical Care Time devoted to patient care services described in this note isMinutes. This time reflects time of care of this signee Dr Seward Carol. This critical care time does not reflect procedure time, or teaching time or supervisory time of PA/NP/Med student/Med Resident etc but could involve care discussion time    Dr. Seward Carol [facility]  04/19/2017 5:02 AM

## 2017-04-19 ENCOUNTER — Inpatient Hospital Stay (HOSPITAL_COMMUNITY): Payer: Medicare Other

## 2017-04-19 ENCOUNTER — Encounter (HOSPITAL_COMMUNITY): Payer: Self-pay | Admitting: *Deleted

## 2017-04-19 DIAGNOSIS — N4289 Other specified disorders of prostate: Secondary | ICD-10-CM | POA: Diagnosis not present

## 2017-04-19 DIAGNOSIS — R6521 Severe sepsis with septic shock: Secondary | ICD-10-CM | POA: Diagnosis not present

## 2017-04-19 DIAGNOSIS — N360 Urethral fistula: Secondary | ICD-10-CM | POA: Diagnosis not present

## 2017-04-19 DIAGNOSIS — N183 Chronic kidney disease, stage 3 (moderate): Secondary | ICD-10-CM | POA: Diagnosis not present

## 2017-04-19 DIAGNOSIS — Z96653 Presence of artificial knee joint, bilateral: Secondary | ICD-10-CM | POA: Diagnosis not present

## 2017-04-19 DIAGNOSIS — E870 Hyperosmolality and hypernatremia: Secondary | ICD-10-CM | POA: Diagnosis not present

## 2017-04-19 DIAGNOSIS — K921 Melena: Secondary | ICD-10-CM | POA: Diagnosis not present

## 2017-04-19 DIAGNOSIS — N340 Urethral abscess: Secondary | ICD-10-CM | POA: Diagnosis not present

## 2017-04-19 DIAGNOSIS — K7460 Unspecified cirrhosis of liver: Secondary | ICD-10-CM | POA: Diagnosis not present

## 2017-04-19 DIAGNOSIS — E872 Acidosis, unspecified: Secondary | ICD-10-CM

## 2017-04-19 DIAGNOSIS — N179 Acute kidney failure, unspecified: Secondary | ICD-10-CM | POA: Diagnosis not present

## 2017-04-19 DIAGNOSIS — R4182 Altered mental status, unspecified: Secondary | ICD-10-CM

## 2017-04-19 DIAGNOSIS — I129 Hypertensive chronic kidney disease with stage 1 through stage 4 chronic kidney disease, or unspecified chronic kidney disease: Secondary | ICD-10-CM | POA: Diagnosis not present

## 2017-04-19 DIAGNOSIS — I248 Other forms of acute ischemic heart disease: Secondary | ICD-10-CM | POA: Diagnosis not present

## 2017-04-19 DIAGNOSIS — D62 Acute posthemorrhagic anemia: Secondary | ICD-10-CM | POA: Diagnosis not present

## 2017-04-19 DIAGNOSIS — E876 Hypokalemia: Secondary | ICD-10-CM | POA: Diagnosis not present

## 2017-04-19 DIAGNOSIS — L02215 Cutaneous abscess of perineum: Secondary | ICD-10-CM | POA: Diagnosis not present

## 2017-04-19 DIAGNOSIS — L03115 Cellulitis of right lower limb: Secondary | ICD-10-CM | POA: Diagnosis not present

## 2017-04-19 DIAGNOSIS — A4101 Sepsis due to Methicillin susceptible Staphylococcus aureus: Secondary | ICD-10-CM | POA: Diagnosis not present

## 2017-04-19 DIAGNOSIS — M7989 Other specified soft tissue disorders: Secondary | ICD-10-CM

## 2017-04-19 DIAGNOSIS — G9341 Metabolic encephalopathy: Secondary | ICD-10-CM | POA: Diagnosis not present

## 2017-04-19 DIAGNOSIS — L0390 Cellulitis, unspecified: Secondary | ICD-10-CM

## 2017-04-19 DIAGNOSIS — Z87891 Personal history of nicotine dependence: Secondary | ICD-10-CM | POA: Diagnosis not present

## 2017-04-19 DIAGNOSIS — E1122 Type 2 diabetes mellitus with diabetic chronic kidney disease: Secondary | ICD-10-CM | POA: Diagnosis not present

## 2017-04-19 DIAGNOSIS — E1151 Type 2 diabetes mellitus with diabetic peripheral angiopathy without gangrene: Secondary | ICD-10-CM | POA: Diagnosis not present

## 2017-04-19 DIAGNOSIS — Z923 Personal history of irradiation: Secondary | ICD-10-CM | POA: Diagnosis not present

## 2017-04-19 LAB — BASIC METABOLIC PANEL
ANION GAP: 19 — AB (ref 5–15)
Anion gap: 13 (ref 5–15)
BUN: 136 mg/dL — ABNORMAL HIGH (ref 6–20)
BUN: 123 mg/dL — ABNORMAL HIGH (ref 6–20)
CALCIUM: 8.5 mg/dL — AB (ref 8.9–10.3)
CHLORIDE: 116 mmol/L — AB (ref 101–111)
CO2: 8 mmol/L — ABNORMAL LOW (ref 22–32)
CO2: 12 mmol/L — ABNORMAL LOW (ref 22–32)
CREATININE: 2.61 mg/dL — AB (ref 0.61–1.24)
Calcium: 8 mg/dL — ABNORMAL LOW (ref 8.9–10.3)
Chloride: 114 mmol/L — ABNORMAL HIGH (ref 101–111)
Creatinine, Ser: 3.71 mg/dL — ABNORMAL HIGH (ref 0.61–1.24)
GFR calc Af Amer: 26 mL/min — ABNORMAL LOW (ref >60)
GFR calc non Af Amer: 22 mL/min — ABNORMAL LOW (ref >60)
GFR, EST AFRICAN AMERICAN: 17 mL/min — AB (ref >60)
GFR, EST NON AFRICAN AMERICAN: 15 mL/min — AB (ref >60)
GLUCOSE: 120 mg/dL — AB (ref 65–99)
GLUCOSE: 136 mg/dL — AB (ref 65–99)
POTASSIUM: 2.4 mmol/L — AB (ref 3.5–5.1)
Potassium: 2.8 mmol/L — ABNORMAL LOW (ref 3.5–5.1)
Sodium: 141 mmol/L (ref 135–145)
Sodium: 141 mmol/L (ref 135–145)

## 2017-04-19 LAB — GASTROINTESTINAL PANEL BY PCR, STOOL (REPLACES STOOL CULTURE)
ADENOVIRUS F40/41: NOT DETECTED
Astrovirus: NOT DETECTED
CAMPYLOBACTER SPECIES: NOT DETECTED
CRYPTOSPORIDIUM: NOT DETECTED
CYCLOSPORA CAYETANENSIS: NOT DETECTED
Entamoeba histolytica: NOT DETECTED
Enteroaggregative E coli (EAEC): NOT DETECTED
Enteropathogenic E coli (EPEC): NOT DETECTED
Enterotoxigenic E coli (ETEC): NOT DETECTED
Giardia lamblia: NOT DETECTED
Norovirus GI/GII: NOT DETECTED
PLESIMONAS SHIGELLOIDES: NOT DETECTED
ROTAVIRUS A: NOT DETECTED
SALMONELLA SPECIES: NOT DETECTED
SHIGELLA/ENTEROINVASIVE E COLI (EIEC): NOT DETECTED
Sapovirus (I, II, IV, and V): NOT DETECTED
Shiga like toxin producing E coli (STEC): NOT DETECTED
VIBRIO SPECIES: NOT DETECTED
Vibrio cholerae: NOT DETECTED
YERSINIA ENTEROCOLITICA: NOT DETECTED

## 2017-04-19 LAB — CBC
HCT: 25.8 % — ABNORMAL LOW (ref 39.0–52.0)
HCT: 23.2 % — ABNORMAL LOW (ref 39.0–52.0)
HEMATOCRIT: 23.6 % — AB (ref 39.0–52.0)
HEMOGLOBIN: 8.4 g/dL — AB (ref 13.0–17.0)
HEMOGLOBIN: 8 g/dL — AB (ref 13.0–17.0)
HEMOGLOBIN: 7.8 g/dL — AB (ref 13.0–17.0)
MCH: 26.5 pg (ref 26.0–34.0)
MCH: 26.9 pg (ref 26.0–34.0)
MCH: 26.6 pg (ref 26.0–34.0)
MCHC: 32.6 g/dL (ref 30.0–36.0)
MCHC: 33.9 g/dL (ref 30.0–36.0)
MCHC: 33.6 g/dL (ref 30.0–36.0)
MCV: 81.4 fL (ref 78.0–100.0)
MCV: 79.5 fL (ref 78.0–100.0)
MCV: 79.2 fL (ref 78.0–100.0)
Platelets: 425 10*3/uL — ABNORMAL HIGH (ref 150–400)
Platelets: 349 10*3/uL (ref 150–400)
Platelets: 369 10*3/uL (ref 150–400)
RBC: 3.17 MIL/uL — AB (ref 4.22–5.81)
RBC: 2.97 MIL/uL — ABNORMAL LOW (ref 4.22–5.81)
RBC: 2.93 MIL/uL — AB (ref 4.22–5.81)
RDW: 22.7 % — ABNORMAL HIGH (ref 11.5–15.5)
RDW: 22.6 % — AB (ref 11.5–15.5)
RDW: 22.7 % — ABNORMAL HIGH (ref 11.5–15.5)
WBC: 25.6 10*3/uL — AB (ref 4.0–10.5)
WBC: 22.8 10*3/uL — AB (ref 4.0–10.5)
WBC: 21.2 10*3/uL — ABNORMAL HIGH (ref 4.0–10.5)

## 2017-04-19 LAB — BLOOD CULTURE ID PANEL (REFLEXED)
ACINETOBACTER BAUMANNII: NOT DETECTED
CANDIDA ALBICANS: NOT DETECTED
Candida glabrata: NOT DETECTED
Candida krusei: NOT DETECTED
Candida parapsilosis: NOT DETECTED
Candida tropicalis: NOT DETECTED
ENTEROBACTERIACEAE SPECIES: NOT DETECTED
ENTEROCOCCUS SPECIES: NOT DETECTED
Enterobacter cloacae complex: NOT DETECTED
Escherichia coli: NOT DETECTED
HAEMOPHILUS INFLUENZAE: NOT DETECTED
Klebsiella oxytoca: NOT DETECTED
Klebsiella pneumoniae: NOT DETECTED
LISTERIA MONOCYTOGENES: NOT DETECTED
METHICILLIN RESISTANCE: DETECTED — AB
Neisseria meningitidis: NOT DETECTED
PSEUDOMONAS AERUGINOSA: NOT DETECTED
Proteus species: NOT DETECTED
SERRATIA MARCESCENS: NOT DETECTED
STAPHYLOCOCCUS AUREUS BCID: DETECTED — AB
STREPTOCOCCUS AGALACTIAE: NOT DETECTED
STREPTOCOCCUS PNEUMONIAE: NOT DETECTED
STREPTOCOCCUS PYOGENES: NOT DETECTED
STREPTOCOCCUS SPECIES: NOT DETECTED
Staphylococcus species: DETECTED — AB

## 2017-04-19 LAB — GLUCOSE, CAPILLARY
GLUCOSE-CAPILLARY: 109 mg/dL — AB (ref 65–99)
Glucose-Capillary: 117 mg/dL — ABNORMAL HIGH (ref 65–99)
Glucose-Capillary: 121 mg/dL — ABNORMAL HIGH (ref 65–99)
Glucose-Capillary: 129 mg/dL — ABNORMAL HIGH (ref 65–99)
Glucose-Capillary: 92 mg/dL (ref 65–99)
Glucose-Capillary: 95 mg/dL (ref 65–99)

## 2017-04-19 LAB — TROPONIN I
TROPONIN I: 0.04 ng/mL — AB (ref <0.03)
Troponin I: 0.05 ng/mL (ref <0.03)

## 2017-04-19 LAB — PHOSPHORUS: PHOSPHORUS: 4.4 mg/dL (ref 2.5–4.6)

## 2017-04-19 LAB — TYPE AND SCREEN
ABO/RH(D): O POS
ANTIBODY SCREEN: NEGATIVE

## 2017-04-19 LAB — MRSA PCR SCREENING: MRSA BY PCR: NEGATIVE

## 2017-04-19 LAB — CBG MONITORING, ED: GLUCOSE-CAPILLARY: 115 mg/dL — AB (ref 65–99)

## 2017-04-19 LAB — PROCALCITONIN: PROCALCITONIN: 3.25 ng/mL

## 2017-04-19 LAB — ABO/RH: ABO/RH(D): O POS

## 2017-04-19 LAB — MAGNESIUM: Magnesium: 2.6 mg/dL — ABNORMAL HIGH (ref 1.7–2.4)

## 2017-04-19 MED ORDER — VANCOMYCIN HCL IN DEXTROSE 1-5 GM/200ML-% IV SOLN: 1000.0000 mg | INTRAVENOUS | Status: DC

## 2017-04-19 MED ORDER — SODIUM CHLORIDE 0.9 % IV SOLN
500.0000 mg | Freq: Two times a day (BID) | INTRAVENOUS | Status: DC
  Administered 2017-04-19 – 2017-04-21 (×5): 500 mg via INTRAVENOUS
  Filled 2017-04-19 (×6): qty 0.5

## 2017-04-19 MED ORDER — LACTATED RINGERS IV BOLUS (SEPSIS)
1000.0000 mL | Freq: Once | INTRAVENOUS | Status: AC
  Administered 2017-04-19: 1000 mL via INTRAVENOUS

## 2017-04-19 MED ORDER — INSULIN ASPART 100 UNIT/ML ~~LOC~~ SOLN
2.0000 [IU] | SUBCUTANEOUS | Status: DC
  Administered 2017-04-19 (×2): 2 [IU] via SUBCUTANEOUS

## 2017-04-19 MED ORDER — SODIUM CHLORIDE 0.9 % IV SOLN: 250.0000 mL | INTRAVENOUS | Status: DC | PRN

## 2017-04-19 MED ORDER — VANCOMYCIN HCL 500 MG IV SOLR
500.0000 mg | INTRAVENOUS | Status: DC
  Administered 2017-04-19: 500 mg via INTRAVENOUS
  Filled 2017-04-19: qty 500

## 2017-04-19 MED ORDER — SODIUM CHLORIDE 0.9 % IV SOLN: INTRAVENOUS | Status: DC

## 2017-04-19 MED ORDER — POTASSIUM CHLORIDE 10 MEQ/100ML IV SOLN
10.0000 meq | INTRAVENOUS | Status: AC
  Administered 2017-04-19 (×6): 10 meq via INTRAVENOUS
  Filled 2017-04-19 (×5): qty 100

## 2017-04-19 MED ORDER — POTASSIUM CHLORIDE 10 MEQ/100ML IV SOLN
10.0000 meq | INTRAVENOUS | Status: AC
  Administered 2017-04-19 – 2017-04-20 (×6): 10 meq via INTRAVENOUS
  Filled 2017-04-19 (×5): qty 100

## 2017-04-19 NOTE — Progress Notes (Signed)
Pharmacy Antibiotic Note  Colin [name] is a 76 y.o. male admitted on 04/18/2017 with urethral/rectal fistula.  Pharmacy has been consulted for vancomycin and meropenem dosing. Pt is hypothermic and WBC is significantly elevated at 25.6. SCr is well above baseline at 3.71. Lactic acid is <1.   Plan: Change vancomycin to 1gm IV Q48H Continue meropenem 500mg  IV Q12H F/u renal fxn, C&S, clinical status and trough at SS  Height: 5\' 6"  (167.6 cm) Weight: 148 lb 13 oz (67.5 kg) IBW/kg (Calculated) : 63.8  Temp (24hrs), Avg:95.8 F (35.4 C), Min:93 F (33.9 C), Max:97.3 F (36.3 C)   Recent Labs Lab 04/18/17 1909 04/18/17 2116 04/19/17 0342  WBC 41.2*  --  25.6*  CREATININE 4.26*  --  3.71*  LATICACIDVEN 1.2 0.9  --     Estimated Creatinine Clearance: 15.5 mL/min (A) (by C-G formula based on SCr of 3.71 mg/dL (H)).    No Known Allergies  Antimicrobials this admission: Vanc 10/12>> Meropenem 10/12>>  Dose adjustments this admission: N/A  Microbiology results: 10/13 MRSA - NEG 10/12 Blood - pending 10/12 CDiff - NEG  Thank you for allowing pharmacy to be a part of this patient's care.  Ostin Mathey, Rande Lawman 04/19/2017 11:10 AM

## 2017-04-19 NOTE — Consult Note (Addendum)
Urology Consult   Physician requesting consult: Dr. Chase Caller  Reason for consult: Rectourethral fistula  History of Present Illness: Colin [name] is a 76 y.o. with a history of prostate cancer followed by Dr. Diona Fanti and Dr. Junious Silk.  He was initially diagnosed with Gleason 3+3=6 adenocarcinoma of the prostate at some point in the distant past and treated with external beam radiation therapy under the care of Dr. Solon Augusta in [facility].  He subsequently transferred his care to [facility] and was followed by Dr. Diona Fanti.  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  He elected local salvage treatment with cryotherapy by Dr. Junious Silk in December 2017.  His PSA has become undetectable. By the spring of 2018, he subsequently developed total urinary incontinence and urethral stricture disease, however.  He required urethral dilation and reportedly was performing CIC as of June 2018 to maintain urethral patency although the patient cannot give an accurate history to confirm this.  By report, he is very functional and independent at baseline.  He presented to the [facility] ED last night with mental status changes and was found in black tarry stool including stool from his urethral.  An initial placement of a Foley catheter was confirmed to be in his prostatic urethra with evidence of a prostatic urethral-rectal fistula.  His catheter has been draining minimal black tarry stool. 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 He has not been febrile or hemodynamically unstable. He was transferred to [facility] for additional care.    Past Medical History:  Diagnosis Date  . Arthritis   . Bilateral lower extremity edema   . Chronic venous insufficiency   . CKD (chronic kidney disease),  stage III ([place])   . History of alcohol abuse    none since 2013 per pt  . History of chronic hepatitis    Genotype 1, FO/F1-- treated w/ harvoni (completed May 2016)  . History of external beam radiation therapy    2007 prostate  . History of intravenous drug use in remission    per pt in remission since 1980's  . HOH (hard of hearing)   . Hypertension   . Normocytic anemia   . Prostate cancer ([place])   . Recurrent prostate cancer [facility]) urologist-  dr Junious Silk   dx 2007  s/p  external beam radioation therapy/ recurrent 12-2015  Gleason 4+4  . Type 2 diabetes, diet controlled ([place])    no meds since 2010 approx    Past Surgical History:  Procedure Laterality Date  . CATARACT EXTRACTION W/PHACO Left 12/14/2012   Procedure: CATARACT EXTRACTION PHACO AND INTRAOCULAR LENS PLACEMENT (IOC);  Surgeon: Tonny Branch, MD;  Location: [facility] OR[facility];  Service: Ophthalmology;  Laterality: Left;  CDE: 13.74  . CATARACT EXTRACTION W/PHACO Right 12/28/2012   Procedure: CATARACT EXTRACTION PHACO AND INTRAOCULAR LENS PLACEMENT (IOC);  Surgeon: Tonny Branch, MD;  Location: [facility];  Service: Ophthalmology;  Laterality: Right;  CDE:12.80  . COLONOSCOPY N/A 02/14/2014   FBP:ZWCHENID colonic polyps-removed as described above Colonic diverticulosis (tubular adenoma)  . CRYOABLATION N/A 05/24/2016   Procedure: CRYO ABLATION PROSTATE;  Surgeon: Festus Aloe, MD;  Location: [facility];  Service: Urology;  Laterality: N/A;  . ENDOVENOUS ABLATION SAPHENOUS VEIN W/ LASER  2013   Right leg X 2  by Dr. Debara Pickett  . ESOPHAGOGASTRODUODENOSCOPY N/A 02/14/2014   OVF:IEPPIR large pedunculated gastric polyp (hyperplastic)  . REIMPLANTATION OF TOTAL KNEE Left 03/16/2010  . TONSILLECTOMY  child  . TOTAL KNEE  PROSTHESIS REMOVAL W/ SPACER INSERTION Left 12/29/2009  . TOTAL KNEE ARTHROPLASTY Bilateral left 2007/   right 2005 approx    Medications:  Home meds:  Current Meds  Medication Sig  . cephALEXin (KEFLEX) 500 MG  capsule Take 1 capsule (500 mg total) by mouth 2 (two) times daily.  . ferrous sulfate 325 (65 FE) MG tablet Take 325 mg by mouth 3 (three) times daily with meals.  Marland Kitchen HYDROcodone-acetaminophen (LORCET HD) 10-325 MG per tablet Take 1 tablet by mouth every 6 (six) hours as needed for pain.  . Multiple Vitamins-Minerals (MULTIVITAMIN WITH MINERALS) tablet Take 1 tablet by mouth daily.    Marland Kitchen torsemide (DEMADEX) 20 MG tablet Take 2 tablets by mouth 4 (four) times daily.     Scheduled Meds: . insulin aspart  2-6 Units Subcutaneous Q4H   Continuous Infusions: . sodium chloride    . meropenem (MERREM) IV    . potassium chloride 10 mEq (04/19/17 0713)  .  sodium bicarbonate  infusion 1000 mL 75 mL/hr at 04/19/17 0600  . sodium chloride Stopped (04/18/17 2213)  . vancomycin     PRN Meds:.sodium chloride  Allergies: No Known Allergies  Family History  Problem Relation Age of Onset  . Colon cancer Neg Hx   . Liver disease Neg Hx     Social History:  reports that he quit smoking about 44 years ago. He quit after 12.00 years of use. He has never used smokeless tobacco. He reports that he does not drink alcohol or use drugs.  ROS: A complete review of systems was performed.  All systems are negative except for pertinent findings as noted.  Physical Exam:  Vital signs in last 24 hours: Temp:  [93 F (33.9 C)-97.3 F (36.3 C)] 97.3 F (36.3 C) (10/13 0600) Pulse Rate:  [81-101] 101 (10/13 0700) Resp:  [10-24] 15 (10/13 0700) BP: (90-120)/(51-75) 103/68 (10/13 0700) SpO2:  [88 %-100 %] 100 % (10/13 0700) Weight:  [67.5 kg (148 lb 13 oz)] 67.5 kg (148 lb 13 oz) (10/13 0307) Constitutional:  He is confused and unable to answer questions.  He is awake and aware. Cardiovascular: Regular rate and rhythm, No JVD Respiratory: Normal respiratory effort GI: Abdomen is soft, nontender, nondistended, no abdominal masses Genitourinary: No CVAT. Normal male phallus, testes are descended bilaterally  and non-tender and without masses, scrotum is normal in appearance without lesions or masses, perineum is normal on inspection. His catheter is an indwelling 3 way temperature catheter draining minimal black appearing watery stool. Rectal: Normal sphincter tone, no rectal masses, black tarry stool noted per rectum, I cannot palpate his catheter per rectum. I can palpate a large fistulous tract in the vicinity of the prostate.  This likely measures > 1 cm. Lymphatic: No lymphadenopathy Neurologic: Grossly intact, no focal deficits Psychiatric: Confused  Laboratory Data:   Recent Labs  04/18/17 1909 04/19/17 0342  WBC 41.2* 25.6*  HGB 8.8* 8.4*  HCT 26.3* 25.8*  PLT 404* 425*     Recent Labs  04/18/17 1909 04/19/17 0342  NA 137 141  K 3.0* 2.8*  CL 108 114*  GLUCOSE 131* 120*  BUN 140* 136*  CALCIUM 8.9 8.5*  CREATININE 4.26* 3.71*     Results for orders placed or performed during the hospital encounter of 04/18/17 (  from the past 24 hour(s))  CBC with Differential     Status: Abnormal   Collection Time: 04/18/17  7:09 PM  Result Value Ref Range   WBC 41.2 (H) 4.0 - 10.5 K/uL   RBC 3.18 (L) 4.22 - 5.81 MIL/uL   Hemoglobin 8.8 (L) 13.0 - 17.0 g/dL   HCT 26.3 (L) 39.0 - 52.0 %   MCV 82.7 78.0 - 100.0 fL   MCH 27.7 26.0 - 34.0 pg   MCHC 33.5 30.0 - 36.0 g/dL   RDW 22.7 (H) 11.5 - 15.5 %   Platelets 404 (H) 150 - 400 K/uL   Neutrophils Relative % 95 %   Neutro Abs 39.3 (H) 1.7 - 7.7 K/uL   Lymphocytes Relative 3 %   Lymphs Abs 1.3 0.7 - 4.0 K/uL   Monocytes Relative 2 %   Monocytes Absolute 0.7 0.1 - 1.0 K/uL   Eosinophils Relative 0 %   Eosinophils Absolute 0.0 0.0 - 0.7 K/uL   Basophils Relative 0 %   Basophils Absolute 0.0 0.0 - 0.1 K/uL  Comprehensive metabolic panel     Status: Abnormal   Collection Time: 04/18/17  7:09 PM  Result Value Ref Range   Sodium 137 135 - 145 mmol/L   Potassium 3.0 (L) 3.5 - 5.1 mmol/L   Chloride 108 101 - 111 mmol/L   CO2 <7 (L)  22 - 32 mmol/L   Glucose, Bld 131 (H) 65 - 99 mg/dL   BUN 140 (H) 6 - 20 mg/dL   Creatinine, Ser 4.26 (H) 0.61 - 1.24 mg/dL   Calcium 8.9 8.9 - 10.3 mg/dL   Total Protein 7.7 6.5 - 8.1 g/dL   Albumin 2.7 (L) 3.5 - 5.0 g/dL   AST 50 (H) 15 - 41 U/L   ALT 28 17 - 63 U/L   Alkaline Phosphatase 223 (H) 38 - 126 U/L   Total Bilirubin 0.7 0.3 - 1.2 mg/dL   GFR calc non Af Amer 12 (L) >60 mL/min   GFR calc Af Amer 14 (L) >60 mL/min   Anion gap NOT CALCULATED 5 - 15  Troponin I     Status: Abnormal   Collection Time: 04/18/17  7:09 PM  Result Value Ref Range   Troponin I 0.04 (HH) <0.03 ng/mL  Lactic acid, plasma     Status: None   Collection Time: 04/18/17  7:09 PM  Result Value Ref Range   Lactic Acid, Venous 1.2 0.5 - 1.9 mmol/L  Type and screen [facility]     Status: None   Collection Time: 04/18/17  7:09 PM  Result Value Ref Range   ABO/RH(D) O POS    Antibody Screen NEG    Sample Expiration 04/21/2017   Ammonia     Status: Abnormal   Collection Time: 04/18/17  7:09 PM  Result Value Ref Range   Ammonia 92 (H) 9 - 35 umol/L  Protime-INR     Status: Abnormal   Collection Time: 04/18/17  7:09 PM  Result Value Ref Range   Prothrombin Time 15.7 (H) 11.4 - 15.2 seconds   INR 1.26   Blood Culture (routine x 2)     Status: None (Preliminary result)   Collection Time: 04/18/17  7:09 PM  Result Value Ref Range   Specimen Description RIGHT ANTECUBITAL    Special Requests      BOTTLES DRAWN AEROBIC AND ANAEROBIC Blood Culture adequate volume   Culture PENDING    Report Status PENDING   Ethanol  Status: None   Collection Time: 04/18/17  7:11 PM  Result Value Ref Range   Alcohol, Ethyl (B) <10 <10 mg/dL  C difficile quick scan w PCR reflex     Status: None   Collection Time: 04/18/17  9:05 PM  Result Value Ref Range   C Diff antigen NEGATIVE NEGATIVE   C Diff toxin NEGATIVE NEGATIVE   C Diff interpretation No C. difficile detected.   Lactic acid, plasma     Status:  None   Collection Time: 04/18/17  9:16 PM  Result Value Ref Range   Lactic Acid, Venous 0.9 0.5 - 1.9 mmol/L  Blood Culture (routine x 2)     Status: None (Preliminary result)   Collection Time: 04/18/17 10:26 PM  Result Value Ref Range   Specimen Description RIGHT ANTECUBITAL    Special Requests      BOTTLES DRAWN AEROBIC ONLY Blood Culture adequate volume   Culture PENDING    Report Status PENDING   Blood gas, arterial     Status: Abnormal   Collection Time: 04/18/17 10:30 PM  Result Value Ref Range   FIO2 21.00    O2 Content ROOM AIR L/min   Delivery systems ROOM AIR    pH, Arterial 7.266 (L) 7.350 - 7.450   pCO2 arterial  32.0 - 48.0 mmHg    CRITICAL RESULT CALLED TO, READ BACK BY AND VERIFIED WITH:   pO2, Arterial 129.0 (H) 83.0 - 108.0 mmHg   Bicarbonate  20.0 - 28.0 mmol/L    CRITICAL RESULT CALLED TO, READ BACK BY AND VERIFIED WITH:   O2 Saturation 97.6 %   Patient temperature 35.4    Collection site RIGHT BRACHIAL    Drawn by [id number]    Sample type ARTERIAL    Allens test (pass/fail) NOT INDICATED (A) PASS  CBG monitoring, ED     Status: Abnormal   Collection Time: 04/19/17  2:01 AM  Result Value Ref Range   Glucose-Capillary 115 (H) 65 - 99 mg/dL  Procalcitonin - Baseline     Status: None   Collection Time: 04/19/17  3:42 AM  Result Value Ref Range   Procalcitonin 3.25 ng/mL  Basic metabolic panel     Status: Abnormal   Collection Time: 04/19/17  3:42 AM  Result Value Ref Range   Sodium 141 135 - 145 mmol/L   Potassium 2.8 (L) 3.5 - 5.1 mmol/L   Chloride 114 (H) 101 - 111 mmol/L   CO2 8 (L) 22 - 32 mmol/L   Glucose, Bld 120 (H) 65 - 99 mg/dL   BUN 136 (H) 6 - 20 mg/dL   Creatinine, Ser 3.71 (H) 0.61 - 1.24 mg/dL   Calcium 8.5 (L) 8.9 - 10.3 mg/dL   GFR calc non Af Amer 15 (L) >60 mL/min   GFR calc Af Amer 17 (L) >60 mL/min   Anion gap 19 (H) 5 - 15  Magnesium     Status: Abnormal   Collection Time: 04/19/17  3:42 AM  Result Value Ref Range   Magnesium  2.6 (H) 1.7 - 2.4 mg/dL  Phosphorus     Status: None   Collection Time: 04/19/17  3:42 AM  Result Value Ref Range   Phosphorus 4.4 2.5 - 4.6 mg/dL  CBC     Status: Abnormal   Collection Time: 04/19/17  3:42 AM  Result Value Ref Range   WBC 25.6 (H) 4.0 - 10.5 K/uL   RBC 3.17 (L) 4.22 - 5.81 MIL/uL   Hemoglobin  8.4 (L) 13.0 - 17.0 g/dL   HCT 25.8 (L) 39.0 - 52.0 %   MCV 81.4 78.0 - 100.0 fL   MCH 26.5 26.0 - 34.0 pg   MCHC 32.6 30.0 - 36.0 g/dL   RDW 22.7 (H) 11.5 - 15.5 %   Platelets 425 (H) 150 - 400 K/uL  Glucose, capillary     Status: Abnormal   Collection Time: 04/19/17  3:42 AM  Result Value Ref Range   Glucose-Capillary 109 (H) 65 - 99 mg/dL   Comment 1 Capillary Specimen   Blood gas, arterial     Status: Abnormal   Collection Time: 04/19/17  3:45 AM  Result Value Ref Range   FIO2 21.00    pH, Arterial 7.285 (L) 7.350 - 7.450   pCO2 arterial VALUE BELOW REPORTABLE RANGE 32.0 - 48.0 mmHg   pO2, Arterial 126.00 (H) 83.0 - 108.0 mmHg   Bicarbonate 6.1 (L) 20.0 - 28.0 mmol/L   Acid-base deficit 19.8 (H) 0.0 - 2.0 mmol/L   O2 Saturation 97.4 %   Patient temperature 98.6    Collection site LEFT RADIAL    Drawn by [id number]    Sample type ARTERIAL DRAW    Allens test (pass/fail) PASS PASS  Troponin I (q 6hr x 3)     Status: Abnormal   Collection Time: 04/19/17  4:43 AM  Result Value Ref Range   Troponin I 0.05 (HH) <0.03 ng/mL   Recent Results (from the past 240 hour(s))  Blood Culture (routine x 2)     Status: None (Preliminary result)   Collection Time: 04/18/17  7:09 PM  Result Value Ref Range Status   Specimen Description RIGHT ANTECUBITAL  Final   Special Requests   Final    BOTTLES DRAWN AEROBIC AND ANAEROBIC Blood Culture adequate volume   Culture PENDING  Incomplete   Report Status PENDING  Incomplete  C difficile quick scan w PCR reflex     Status: None   Collection Time: 04/18/17  9:05 PM  Result Value Ref Range Status   C Diff antigen NEGATIVE NEGATIVE  Final   C Diff toxin NEGATIVE NEGATIVE Final   C Diff interpretation No C. difficile detected.  Final    Comment: VALID  Blood Culture (routine x 2)     Status: None (Preliminary result)   Collection Time: 04/18/17 10:26 PM  Result Value Ref Range Status   Specimen Description RIGHT ANTECUBITAL  Final   Special Requests   Final    BOTTLES DRAWN AEROBIC ONLY Blood Culture adequate volume   Culture PENDING  Incomplete   Report Status PENDING  Incomplete    Renal Function:  Recent Labs  04/18/17 1909 04/19/17 0342  CREATININE 4.26* 3.71*   Estimated Creatinine Clearance: 15.5 mL/min (A) (by C-G formula based on SCr of 3.71 mg/dL (H)).  Radiologic Imaging: Ct Abdomen Pelvis Wo Contrast  Result Date: 04/18/2017 CLINICAL DATA:  Black diarrhea. Prostate cancer status post cryoablation and November 2017 EXAM: CT ABDOMEN AND PELVIS WITHOUT CONTRAST TECHNIQUE: Multidetector CT imaging of the abdomen and pelvis was performed following the standard protocol without IV contrast. COMPARISON:  CT 10/13/2015 FINDINGS: Lower chest: Lung bases are clear. Hepatobiliary: No focal hepatic lesion. No biliary duct dilatation. Gallbladder is normal. Common bile duct is normal. Pancreas: Pancreas is normal. No ductal dilatation. No pancreatic inflammation. Spleen: Normal spleen Adrenals/urinary tract: Adrenal glands are normal. Kidneys are normal without obstruction. There is gas within bladder. There is Foley catheter expanded within the prostatic  urethra of the prostate gland (image 63, series 6). There is gas within the prostate gland anterior to the Foley bulb in posterior to the bulb surrounding the urethra. Gas-filled track between the anterior wall the rectum and the prostate gland / urethra (image 76, series 2). Small gas within the anterior wall of the rectum (image 74, series 2). Additionally there is a tract leading inferiorly from the prostate gland/ urethra the LEFT perineum (image 85 and 90. Series  2). There small abscess at the base the bladder base the penis measuring 2.9 by 1.7 cm. Stomach/Bowel: Stomach, small-bowel, appendix cecum normal. The colon rectosigmoid colon normal. Gas and anterior wall the rectum and potential communication with the prostate gland/3 described above. No abscess formation within the pelvis peritoneal or retroperitoneal space. Vascular/Lymphatic: Abdominal aorta is normal caliber. There is no retroperitoneal or periportal lymphadenopathy. No pelvic lymphadenopathy. Reproductive: Prostate gland as described above within the bladder section Other: No free fluid. Musculoskeletal: No aggressive osseous lesion IMPRESSION: 1. 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 2. Extensive gas within the prostate gland. The Foley catheter bulb is expanded within the prostatic urethra. Recommend deflation and advancement of the Foley bulb into the lumen of the bladder. 3. Communication between the prostate gland / prostatic urethra / rectum and the base the penis and LEFT perineum consistent with a fistulous track and abscess formation. 4. Recommend urology consultation. Findings conveyed toKATHLEEN MCMANUS on 04/18/2017  at21:48. Electronically Signed   By: Suzy Bouchard M.D.   On: 04/18/2017 21:48   Ct Head Wo Contrast  Result Date: 04/18/2017 CLINICAL DATA:  Confusion and altered mental status. EXAM: CT HEAD WITHOUT CONTRAST TECHNIQUE: Contiguous axial images were obtained from the base of the skull through the vertex without intravenous contrast. COMPARISON:  None. FINDINGS: Brain: There is an age-indeterminate left frontoparietal subdural hematoma, measuring 10 mm in maximum thickness. No evidence of significant midline shift. No evidence of acute infarction or hydrocephalus. Moderate brain parenchymal volume loss and periventricular microangiopathy. Vascular: No  hyperdense vessel or unexpected calcification. Skull: Normal. Negative for fracture or focal lesion. Sinuses/Orbits: No acute finding. Other: None. IMPRESSION: Age-indeterminate left frontoparietal subdural hematoma measuring 10 mm in maximum thickness. No significant midline shift. Moderate brain parenchymal volume loss and periventricular microangiopathy. These results were called by telephone at the time of interpretation on 04/18/2017 at 9:33 pm to Dr. Francine Graven , who verbally acknowledged these results. Electronically Signed   By: Fidela Salisbury M.D.   On: 04/18/2017 21:41   US Abdomen Complete  Result Date: 04/19/2017 CLINICAL DATA:  Initial evaluation for acute abdominal pain for 1 day. EXAM: ABDOMEN ULTRASOUND COMPLETE COMPARISON:  Prior CT from 04/18/2017. FINDINGS: Gallbladder: Shadowing echogenic calculi present within the gallbladder lumen, largest of which measures 1.1 cm. Gallbladder wall measure within normal limits at 2.6 mm. No free pericholecystic fluid. No sonographic Murphy sign elicited on exam. Common bile duct: Diameter: 5.8 mm Liver: No focal lesion identified. Within normal limits in parenchymal echogenicity. Portal vein is patent on color Doppler imaging with normal direction of blood flow towards the liver. IVC: No abnormality visualized. Pancreas: Visualized portion unremarkable. Spleen: Size and appearance within normal limits. Right Kidney: Length: 12.2 cm. Echogenicity within normal limits. No solid mass visualized. Mild hydronephrosis. 1.8 x 1.2 x 2.0 cm cyst noted. Cortical thinning noted. Left Kidney: Length: 10.6 cm. Echogenicity within normal limits. No solid  mass visualized. Mild hydronephrosis. Cortical thinning noted. Abdominal aorta: No aneurysm visualized. Other findings: None. IMPRESSION: 1. Cholelithiasis with no other sonographic features to suggest acute cholecystitis. No biliary dilatation. 2. Mild bilateral hydronephrosis. 3. Chronic renal cortical  thinning bilaterally. Electronically Signed   By: Jeannine Boga M.D.   On: 04/19/2017 05:44   Dg Chest Port 1 View  Result Date: 04/18/2017 CLINICAL DATA:  Diarrhea and confusion.  Altered mental status. EXAM: PORTABLE CHEST 1 VIEW COMPARISON:  12/28/2009 FINDINGS: A single AP portable view of the chest demonstrates no focal airspace consolidation or alveolar edema. The lungs are grossly clear. There is no large effusion or pneumothorax. Cardiac and mediastinal contours appear unremarkable. IMPRESSION: No active disease. Electronically Signed   By: Andreas Newport M.D.   On: 04/18/2017 20:44    I independently reviewed the above imaging studies.  Procedure:  I irrigated his current catheter and was able to flush with saline but could not irrigate.  I took his balloon down and attempted to gently reposition his catheter but felt this was likely not in his bladder. I removed this catheter and placed a 16 Fr coude catheter that appeared to pass without any resistance.  This was irrigated and irrigated well and quantitatively with return of watery black fluid.  It was felt that this was likely within the bladder but difficult to absolutely confirm.  Impression/Recommendation 1) Recto-urethral fistula: Will repeat CT of pelvis to confirm catheter placement in bladder.  If so, this will temporize his situation although he will need more definitive urinary diversion such as suprapubic tube.  I will await general surgery evaluation and discuss with them.  If he is felt to need a bowel diversion (colostomy), we will proceed with suprapubic placement at the same setting.  If general surgery does not plan to proceed with bowel diversion and/or catheter is not in the correct position on his CT this morning, he will need SP tube placement in the OR regardless of general surgery decision assuming he is stabilized enough to proceed with anesthesia.  Alternative would be to place percutaneous nephrostomy tubes  until he is more stable. Continue broad spectrum antibiotics with Meropenem and Vancomycin pending cultures.  2) AKI: This is not likely to be obstructive as major etiology but will ensure proper bladder drainage.    3) Prostate cancer: He is currently disease free.  Will notify Dr. Diona Fanti and Dr. Junious Silk of patient's admission following temporizing measures this weekend.  Jaleigh Mccroskey,LES 04/19/2017, 7:16 AM    Pryor Curia. MD  CC: Dr. Chase Caller

## 2017-04-19 NOTE — Progress Notes (Signed)
PHARMACY - PHYSICIAN COMMUNICATION CRITICAL VALUE ALERT - BLOOD CULTURE IDENTIFICATION (BCID)  Results for orders placed or performed during the hospital encounter of 04/18/17  Blood Culture ID Panel (Reflexed) (Collected: 04/18/2017  7:09 PM)  Result Value Ref Range   Enterococcus species NOT DETECTED NOT DETECTED   Listeria monocytogenes NOT DETECTED NOT DETECTED   Staphylococcus species DETECTED (A) NOT DETECTED   Staphylococcus aureus DETECTED (A) NOT DETECTED   Methicillin resistance DETECTED (A) NOT DETECTED   Streptococcus species NOT DETECTED NOT DETECTED   Streptococcus agalactiae NOT DETECTED NOT DETECTED   Streptococcus pneumoniae NOT DETECTED NOT DETECTED   Streptococcus pyogenes NOT DETECTED NOT DETECTED   Acinetobacter baumannii NOT DETECTED NOT DETECTED   Enterobacteriaceae species NOT DETECTED NOT DETECTED   Enterobacter cloacae complex NOT DETECTED NOT DETECTED   Escherichia coli NOT DETECTED NOT DETECTED   Klebsiella oxytoca NOT DETECTED NOT DETECTED   Klebsiella pneumoniae NOT DETECTED NOT DETECTED   Proteus species NOT DETECTED NOT DETECTED   Serratia marcescens NOT DETECTED NOT DETECTED   Haemophilus influenzae NOT DETECTED NOT DETECTED   Neisseria meningitidis NOT DETECTED NOT DETECTED   Pseudomonas aeruginosa NOT DETECTED NOT DETECTED   Candida albicans NOT DETECTED NOT DETECTED   Candida glabrata NOT DETECTED NOT DETECTED   Candida krusei NOT DETECTED NOT DETECTED   Candida parapsilosis NOT DETECTED NOT DETECTED   Candida tropicalis NOT DETECTED NOT DETECTED    Name of physician (or Provider) Contacted: CCM  Changes to prescribed antibiotics required: No changes needed at this time  Gifford Ballon, Rande Lawman 04/19/2017  7:49 PM

## 2017-04-19 NOTE — ED Notes (Signed)
Report given to Jen Rn [id number] [facility] in route.

## 2017-04-19 NOTE — Consult Note (Signed)
Renal Service Consult Note [place] 04/19/2017 Sol Blazing Requesting Physician:  Dr Chase Caller Reason for Consult:  Acute renal failure HPI: The patient is a 76 y.o. year-old w hx of CKD 3, hep C, HTN, prostate cancer, DM2 and ETOH abuse presented to outside hospital on 10/12 yesterday with reported "black stool" for one month.  In ED WBC was 41k.  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  Foley was placed and blackish liquid stool-like material was returned.    Patient had adenoCa of the prostate treated yrs ago with external beam XRT.  He had recurrence treated w/ local cryotherapy in late 2017.  PSA responded.  In spring 2018 however he developed urinary incontinence and urethral stricture disease, had some dilations done and may have been doing self-cath at home per urology notes.    Pt was hypothermic as well with AKI and creat 4.  Asked to see for AKI.    Pt not responding to questions.   Abd US showed bilat mild hydro with bilat cortical thinning, kidney size was 10- 12 cm .        ROS   N/a   Past Medical History  Past Medical History:  Diagnosis Date  . Arthritis   . Bilateral lower extremity edema   . Chronic venous insufficiency   . CKD (chronic kidney disease), stage III ([place])   . History of alcohol abuse    none since 2013 per pt  . History of chronic hepatitis    Genotype 1, FO/F1-- treated w/ harvoni (completed May 2016)  . History of external beam radiation therapy    2007 prostate  . History of intravenous drug use in remission    per pt in remission since 1980's  . HOH (hard of hearing)   . Hypertension   . Normocytic anemia   . Prostate cancer ([place])   . Recurrent prostate cancer [facility]) urologist-  dr Junious Silk   dx 2007  s/p  external beam radioation therapy/ recurrent 12-2015  Gleason 4+4  . Type 2 diabetes, diet controlled ([place])    no meds since 2010 approx   Past Surgical  History  Past Surgical History:  Procedure Laterality Date  . CATARACT EXTRACTION W/PHACO Left 12/14/2012   Procedure: CATARACT EXTRACTION PHACO AND INTRAOCULAR LENS PLACEMENT (IOC);  Surgeon: Tonny Branch, MD;  Location: [facility];  Service: Ophthalmology;  Laterality: Left;  CDE: 13.74  . CATARACT EXTRACTION W/PHACO Right 12/28/2012   Procedure: CATARACT EXTRACTION PHACO AND INTRAOCULAR LENS PLACEMENT (IOC);  Surgeon: Tonny Branch, MD;  Location: [facility];  Service: Ophthalmology;  Laterality: Right;  CDE:12.80  . COLONOSCOPY N/A 02/14/2014   INO:MVEHMCNO colonic polyps-removed as described above Colonic diverticulosis (tubular adenoma)  . CRYOABLATION N/A 05/24/2016   Procedure: CRYO ABLATION PROSTATE;  Surgeon: Festus Aloe, MD;  Location: [facility];  Service: Urology;  Laterality: N/A;  . ENDOVENOUS ABLATION SAPHENOUS VEIN W/ LASER  2013   Right leg X 2 by Dr. Debara Pickett  . ESOPHAGOGASTRODUODENOSCOPY N/A 02/14/2014   BSJ:GGEZMO large pedunculated gastric polyp (hyperplastic)  . REIMPLANTATION OF TOTAL KNEE Left 03/16/2010  . TONSILLECTOMY  child  . TOTAL KNEE  PROSTHESIS REMOVAL W/ SPACER INSERTION Left 12/29/2009  . TOTAL KNEE ARTHROPLASTY Bilateral left 2007/   right 2005 approx   Family History  Family History  Problem Relation Age of Onset  . Colon cancer Neg Hx   . Liver disease Neg Hx  Social History  reports that he quit smoking about 44 years ago. He quit after 12.00 years of use. He has never used smokeless tobacco. He reports that he does not drink alcohol or use drugs. Allergies No Known Allergies Home medications Prior to Admission medications   Medication Sig Start Date End Date Taking? Authorizing Provider  cephALEXin (KEFLEX) 500 MG capsule Take 1 capsule (500 mg total) by mouth 2 (two) times daily. 05/24/16  Yes Festus Aloe, MD  ferrous sulfate 325 (65 FE) MG tablet Take 325 mg by mouth 3 (three) times daily with meals.   Yes [provider]   HYDROcodone-acetaminophen (LORCET HD) 10-325 MG per tablet Take 1 tablet by mouth every 6 (six) hours as needed for pain.   Yes [provider]  Multiple Vitamins-Minerals (MULTIVITAMIN WITH MINERALS) tablet Take 1 tablet by mouth daily.     Yes [provider]  torsemide (DEMADEX) 20 MG tablet Take 2 tablets by mouth 4 (four) times daily.  12/28/13  Yes [provider]   Liver Function Tests  Recent Labs Lab 04/18/17 1909  AST 50*  ALT 28  ALKPHOS 223*  BILITOT 0.7  PROT 7.7  ALBUMIN 2.7*   No results for input(s): LIPASE, AMYLASE in the last 168 hours. CBC  Recent Labs Lab 04/18/17 1909 04/19/17 0342  WBC 41.2* 25.6*  NEUTROABS 39.3*  --   HGB 8.8* 8.4*  HCT 26.3* 25.8*  MCV 82.7 81.4  PLT 404* 027*   Basic Metabolic Panel  Recent Labs Lab 04/18/17 1909 04/19/17 0342  NA 137 141  K 3.0* 2.8*  CL 108 114*  CO2 <7* 8*  GLUCOSE 131* 120*  BUN 140* 136*  CREATININE 4.26* 3.71*  CALCIUM 8.9 8.5*  PHOS  --  4.4   Iron/TIBC/Ferritin/ %Sat    Component Value Date/Time   IRON 37 (L) 01/29/2016 1017   TIBC 295 01/29/2016 1017   FERRITIN 75 01/29/2016 1017   IRONPCTSAT 13 (L) 01/29/2016 1017    Vitals:   04/19/17 1100 04/19/17 1200 04/19/17 1206 04/19/17 1300  BP: (!) 125/55 (!) 97/55  108/62  Pulse: 95   93  Resp: 11 (!) 8  10  Temp:   (!) 97.4 F (36.3 C)   TempSrc:   Oral   SpO2: 100%   100%  Weight:      Height:       Exam Gen HOH, not responding to voice, sleeping, no distress No rash, cyanosis or gangrene Sclera anicteric, throat clear  No jvd or bruits, flat neck veins Chest clear bilat RRR no MRG Abd soft ntnd no mass or ascites +bs GU normal male, foley in place MS no joint effusions or deformity, bilat 2+ pitting edema Bilat LE chronic skin changes Neuro is HOH, barely opens eyes to voice, sleeping  Na 141  K 2.8   CO2 8  BUN 136  Cr 3.71  eGFR 16 UA - not available   Home meds:  Keflex/ fe/ lorcet/ MVI/  demadex 20 qid  Impression: 1. AKI/ on CKD3 - creat 4. Baseline is 1.4- 1.8 from Nov 2017.  Pt is in shock, not requiring pressors. NO urine recorded but pt with rectal/ urinary fistula so this is being missed.  Has sig LE edema, but o/w looks dry.  Agree with supportive care, IVF's with bicarb gtt.   Avoid nsaid's and IV contrast.  Pressors as needed.   2. LE edema - hx of chronic venous insuff. Would ignore this  for new, pt is hypotensive, may be 3rd spaced local edema as exam shows signs of chronicity. 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 Met acidosis - agree with IVF's with NaHCO3 5. Hypothermia - getting empiric IV abx, cx's pending 6. Hep C 7. Prostate cancer 8. HOH 9. Hypotension - seems to be improving    Plan - no need for RRT yet, follow acidosis/ azotemia w/ medical Rx for now.   Kelly Splinter MD [employer] pager [phone]   04/19/2017, 1:18 PM

## 2017-04-19 NOTE — Progress Notes (Signed)
VASCULAR LAB PRELIMINARY  PRELIMINARY  PRELIMINARY  PRELIMINARY  Bilateral lower extremity venous duplex completed.    Preliminary report:  There is no DVT or SVT noted in the bilateral lower extremities.  Interstitial fluid noted throughout the right calf.   Wyman Meschke, RVT 04/19/2017, 12:48 PM

## 2017-04-19 NOTE — ED Notes (Signed)
Spoke to pt's son Marden Noble who said he would stop by and pick them up. Informed he that they will be locked up in Pharmacy.

## 2017-04-19 NOTE — Consult Note (Signed)
Reason for Colin [name] Referring Physician: Dr Orie Rout is an 76 y.o. male.  HPI: 88 yom with mmp including chronic kidney disease, hep c, prostate cancer and dm who has black stool for a month from his urethra.  He has prior history of prostate cancer and has undergone radiotherapy previoulsy.  He had recurrence and was treated with cryotherapy.  He has urinary incontinence baseline.  He has mental status changes right now but apparently went to outside facility overnight with black stool from urethra and mental status changes. He had foley placed that on a ct scan was in prostatic urethra. 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  He was then transferred to [facility].  Not sure of baseline functional status.  He is admitted to icu, not on ventilator. Wbc is better on abx.    Past Medical History:  Diagnosis Date  . Arthritis   . Bilateral lower extremity edema   . Chronic venous insufficiency   . CKD (chronic kidney disease), stage III ([place])   . History of alcohol abuse    none since 2013 per pt  . History of chronic hepatitis    Genotype 1, FO/F1-- treated w/ harvoni (completed May 2016)  . History of external beam radiation therapy    2007 prostate  . History of intravenous drug use in remission    per pt in remission since 1980's  . HOH (hard of hearing)   . Hypertension   . Normocytic anemia   . Prostate cancer ([place])   . Recurrent prostate cancer [facility]) urologist-  dr Junious Silk   dx 2007  s/p  external beam radioation therapy/ recurrent 12-2015  Gleason 4+4  . Type 2 diabetes, diet controlled ([place])    no meds since 2010 approx    Past Surgical History:  Procedure Laterality Date  . CATARACT EXTRACTION W/PHACO Left 12/14/2012   Procedure: CATARACT EXTRACTION PHACO AND INTRAOCULAR LENS PLACEMENT (IOC);  Surgeon: Tonny Branch, MD;  Location: [facility];  Service: Ophthalmology;  Laterality: Left;  CDE: 13.74  . CATARACT EXTRACTION W/PHACO Right  12/28/2012   Procedure: CATARACT EXTRACTION PHACO AND INTRAOCULAR LENS PLACEMENT (IOC);  Surgeon: Tonny Branch, MD;  Location: [facility];  Service: Ophthalmology;  Laterality: Right;  CDE:12.80  . COLONOSCOPY N/A 02/14/2014   GQB:[id number] colonic polyps-removed as described above Colonic diverticulosis (tubular adenoma)  . CRYOABLATION N/A 05/24/2016   Procedure: CRYO ABLATION PROSTATE;  Surgeon: Festus Aloe, MD;  Location: [facility];  Service: Urology;  Laterality: N/A;  . ENDOVENOUS ABLATION SAPHENOUS VEIN W/ LASER  2013   Right leg X 2 by Dr. Debara Pickett  . ESOPHAGOGASTRODUODENOSCOPY N/A 02/14/2014   UEK:[id number] large pedunculated gastric polyp (hyperplastic)  . REIMPLANTATION OF TOTAL KNEE Left 03/16/2010  . TONSILLECTOMY  child  . TOTAL KNEE  PROSTHESIS REMOVAL W/ SPACER INSERTION Left 12/29/2009  . TOTAL KNEE ARTHROPLASTY Bilateral left 2007/   right 2005 approx    Family History  Problem Relation Age of Onset  . Colon cancer Neg Hx   . Liver disease Neg Hx     Social History:  reports that he quit smoking about 44 years ago. He quit after 12.00 years of use. He has never used smokeless tobacco. He reports that he does not drink alcohol or use drugs.  Allergies: No Known Allergies  Medications: I have reviewed the patient's current medications.  Results for orders placed or performed during the hospital encounter of 04/18/17 (from the past  48 hour(s))  CBC with Differential     Status: Abnormal   Collection Time: 04/18/17  7:09 PM  Result Value Ref Range   WBC 41.2 (H) 4.0 - 10.5 K/uL   RBC 3.18 (L) 4.22 - 5.81 MIL/uL   Hemoglobin 8.8 (L) 13.0 - 17.0 g/dL   HCT 26.3 (L) 39.0 - 52.0 %   MCV 82.7 78.0 - 100.0 fL   MCH 27.7 26.0 - 34.0 pg   MCHC 33.5 30.0 - 36.0 g/dL   RDW 22.7 (H) 11.5 - 15.5 %   Platelets 404 (H) 150 - 400 K/uL   Neutrophils Relative % 95 %   Neutro Abs 39.3 (H) 1.7 - 7.7 K/uL   Lymphocytes Relative 3 %   Lymphs Abs 1.3 0.7 - 4.0 K/uL    Monocytes Relative 2 %   Monocytes Absolute 0.7 0.1 - 1.0 K/uL   Eosinophils Relative 0 %   Eosinophils Absolute 0.0 0.0 - 0.7 K/uL   Basophils Relative 0 %   Basophils Absolute 0.0 0.0 - 0.1 K/uL  Comprehensive metabolic panel     Status: Abnormal   Collection Time: 04/18/17  7:09 PM  Result Value Ref Range   Sodium 137 135 - 145 mmol/L   Potassium 3.0 (L) 3.5 - 5.1 mmol/L   Chloride 108 101 - 111 mmol/L   CO2 <7 (L) 22 - 32 mmol/L   Glucose, Bld 131 (H) 65 - 99 mg/dL   BUN 140 (H) 6 - 20 mg/dL    Comment: RESULTS CONFIRMED BY MANUAL DILUTION   Creatinine, Ser 4.26 (H) 0.61 - 1.24 mg/dL   Calcium 8.9 8.9 - 10.3 mg/dL   Total Protein 7.7 6.5 - 8.1 g/dL   Albumin 2.7 (L) 3.5 - 5.0 g/dL   AST 50 (H) 15 - 41 U/L   ALT 28 17 - 63 U/L   Alkaline Phosphatase 223 (H) 38 - 126 U/L   Total Bilirubin 0.7 0.3 - 1.2 mg/dL   GFR calc non Af Amer 12 (L) >60 mL/min   GFR calc Af Amer 14 (L) >60 mL/min    Comment: (NOTE) The eGFR has been calculated using the CKD EPI equation. This calculation has not been validated in all clinical situations. eGFR's persistently <60 mL/min signify possible Chronic Kidney Disease.    Anion gap NOT CALCULATED 5 - 15  Troponin I     Status: Abnormal   Collection Time: 04/18/17  7:09 PM  Result Value Ref Range   Troponin I 0.04 (HH) <0.03 ng/mL    Comment: CRITICAL RESULT CALLED TO, READ BACK BY AND VERIFIED WITH: LASHLEY,S ON 04/18/17 AT 2015 BY LOY,C   Lactic acid, plasma     Status: None   Collection Time: 04/18/17  7:09 PM  Result Value Ref Range   Lactic Acid, Venous 1.2 0.5 - 1.9 mmol/L  Type and screen [facility]     Status: None   Collection Time: 04/18/17  7:09 PM  Result Value Ref Range   ABO/RH(D) O POS    Antibody Screen NEG    Sample Expiration 04/21/2017   Ammonia     Status: Abnormal   Collection Time: 04/18/17  7:09 PM  Result Value Ref Range   Ammonia 92 (H) 9 - 35 umol/L  Protime-INR     Status: Abnormal   Collection  Time: 04/18/17  7:09 PM  Result Value Ref Range   Prothrombin Time 15.7 (H) 11.4 - 15.2 seconds   INR 1.26  Blood Culture (routine x 2)     Status: None (Preliminary result)   Collection Time: 04/18/17  7:09 PM  Result Value Ref Range   Specimen Description RIGHT ANTECUBITAL    Special Requests      BOTTLES DRAWN AEROBIC AND ANAEROBIC Blood Culture adequate volume   Culture PENDING    Report Status PENDING   Ethanol     Status: None   Collection Time: 04/18/17  7:11 PM  Result Value Ref Range   Alcohol, Ethyl (B) <10 <10 mg/dL    Comment:        LOWEST DETECTABLE LIMIT FOR SERUM ALCOHOL IS 10 mg/dL FOR MEDICAL PURPOSES ONLY   C difficile quick scan w PCR reflex     Status: None   Collection Time: 04/18/17  9:05 PM  Result Value Ref Range   C Diff antigen NEGATIVE NEGATIVE   C Diff toxin NEGATIVE NEGATIVE   C Diff interpretation No C. difficile detected.     Comment: VALID  Lactic acid, plasma     Status: None   Collection Time: 04/18/17  9:16 PM  Result Value Ref Range   Lactic Acid, Venous 0.9 0.5 - 1.9 mmol/L  Blood Culture (routine x 2)     Status: None (Preliminary result)   Collection Time: 04/18/17 10:26 PM  Result Value Ref Range   Specimen Description RIGHT ANTECUBITAL    Special Requests      BOTTLES DRAWN AEROBIC ONLY Blood Culture adequate volume   Culture PENDING    Report Status PENDING   Blood gas, arterial     Status: Abnormal   Collection Time: 04/18/17 10:30 PM  Result Value Ref Range   FIO2 21.00    O2 Content ROOM AIR L/min   Delivery systems ROOM AIR    pH, Arterial 7.266 (L) 7.350 - 7.450   pCO2 arterial  32.0 - 48.0 mmHg    CRITICAL RESULT CALLED TO, READ BACK BY AND VERIFIED WITH:    Comment: BELOW REPORTABLE RANGE TO ANN TUTTLE,RN BY K KNICK,RRT,RCP ON 04/18/2017 AT 2238   pO2, Arterial 129.0 (H) 83.0 - 108.0 mmHg   Bicarbonate  20.0 - 28.0 mmol/L    CRITICAL RESULT CALLED TO, READ BACK BY AND VERIFIED WITH:    Comment: BELOW REPORTABLE  RANGE TO ANN TUTTLE,RN BY K KNICK,RRT,RCP ON10/06/2017 AT 2238   O2 Saturation 97.6 %   Patient temperature 35.4    Collection site RIGHT BRACHIAL    Drawn by [id number]    Sample type ARTERIAL    Allens test (pass/fail) NOT INDICATED (A) PASS  CBG monitoring, ED     Status: Abnormal   Collection Time: 04/19/17  2:01 AM  Result Value Ref Range   Glucose-Capillary 115 (H) 65 - 99 mg/dL  MRSA PCR Screening     Status: None   Collection Time: 04/19/17  3:06 AM  Result Value Ref Range   MRSA by PCR NEGATIVE NEGATIVE    Comment:        The GeneXpert MRSA Assay (FDA approved for NASAL specimens only), is one component of a comprehensive MRSA colonization surveillance program. It is not intended to diagnose MRSA infection nor to guide or monitor treatment for MRSA infections.   Procalcitonin - Baseline     Status: None   Collection Time: 04/19/17  3:42 AM  Result Value Ref Range   Procalcitonin 3.25 ng/mL    Comment:        Interpretation: PCT > 2 ng/mL: Systemic infection (  sepsis) is likely, unless other causes are known. (NOTE)         ICU PCT Algorithm               Non ICU PCT Algorithm    ----------------------------     ------------------------------         PCT < 0.25 ng/mL                 PCT < 0.1 ng/mL     Stopping of antibiotics            Stopping of antibiotics       strongly encouraged.               strongly encouraged.    ----------------------------     ------------------------------       PCT level decrease by               PCT < 0.25 ng/mL       >= 80% from peak PCT       OR PCT 0.25 - 0.5 ng/mL          Stopping of antibiotics                                             encouraged.     Stopping of antibiotics           encouraged.    ----------------------------     ------------------------------       PCT level decrease by              PCT >= 0.25 ng/mL       < 80% from peak PCT        AND PCT >= 0.5 ng/mL            Continuing antibiotics                                                encouraged.       Continuing antibiotics            encouraged.    ----------------------------     ------------------------------     PCT level increase compared          PCT > 0.5 ng/mL         with peak PCT AND          PCT >= 0.5 ng/mL             Escalation of antibiotics                                          strongly encouraged.      Escalation of antibiotics        strongly encouraged.   Basic metabolic panel     Status: Abnormal   Collection Time: 04/19/17  3:42 AM  Result Value Ref Range   Sodium 141 135 - 145 mmol/L   Potassium 2.8 (L) 3.5 - 5.1 mmol/L   Chloride 114 (H) 101 - 111 mmol/L   CO2 8 (L) 22 - 32 mmol/L   Glucose, Bld 120 (H) 65 - 99 mg/dL  BUN 136 (H) 6 - 20 mg/dL   Creatinine, Ser 3.71 (H) 0.61 - 1.24 mg/dL   Calcium 8.5 (L) 8.9 - 10.3 mg/dL   GFR calc non Af Amer 15 (L) >60 mL/min   GFR calc Af Amer 17 (L) >60 mL/min    Comment: (NOTE) The eGFR has been calculated using the CKD EPI equation. This calculation has not been validated in all clinical situations. eGFR's persistently <60 mL/min signify possible Chronic Kidney Disease.    Anion gap 19 (H) 5 - 15  Magnesium     Status: Abnormal   Collection Time: 04/19/17  3:42 AM  Result Value Ref Range   Magnesium 2.6 (H) 1.7 - 2.4 mg/dL  Phosphorus     Status: None   Collection Time: 04/19/17  3:42 AM  Result Value Ref Range   Phosphorus 4.4 2.5 - 4.6 mg/dL  CBC     Status: Abnormal   Collection Time: 04/19/17  3:42 AM  Result Value Ref Range   WBC 25.6 (H) 4.0 - 10.5 K/uL   RBC 3.17 (L) 4.22 - 5.81 MIL/uL   Hemoglobin 8.4 (L) 13.0 - 17.0 g/dL   HCT 25.8 (L) 39.0 - 52.0 %   MCV 81.4 78.0 - 100.0 fL   MCH 26.5 26.0 - 34.0 pg   MCHC 32.6 30.0 - 36.0 g/dL   RDW 22.7 (H) 11.5 - 15.5 %   Platelets 425 (H) 150 - 400 K/uL  Glucose, capillary     Status: Abnormal   Collection Time: 04/19/17  3:42 AM  Result Value Ref Range   Glucose-Capillary 109 (H) 65 - 99 mg/dL    Comment 1 Capillary Specimen   Blood gas, arterial     Status: Abnormal   Collection Time: 04/19/17  3:45 AM  Result Value Ref Range   FIO2 21.00    pH, Arterial 7.285 (L) 7.350 - 7.450   pCO2 arterial VALUE BELOW REPORTABLE RANGE 32.0 - 48.0 mmHg   pO2, Arterial 126.00 (H) 83.0 - 108.0 mmHg   Bicarbonate 6.1 (L) 20.0 - 28.0 mmol/L   Acid-base deficit 19.8 (H) 0.0 - 2.0 mmol/L   O2 Saturation 97.4 %   Patient temperature 98.6    Collection site LEFT RADIAL    Drawn by [id number]    Sample type ARTERIAL DRAW    Allens test (pass/fail) PASS PASS  Troponin I (q 6hr x 3)     Status: Abnormal   Collection Time: 04/19/17  4:43 AM  Result Value Ref Range   Troponin I 0.05 (HH) <0.03 ng/mL    Comment: CRITICAL RESULT CALLED TO, READ BACK BY AND VERIFIED WITH: FLYNT F,RN 04/19/17 0618 WAYK   Glucose, capillary     Status: None   Collection Time: 04/19/17  7:46 AM  Result Value Ref Range   Glucose-Capillary 95 65 - 99 mg/dL    Ct Abdomen Pelvis Wo Contrast  Result Date: 04/18/2017 CLINICAL DATA:  Black diarrhea. Prostate cancer status post cryoablation and November 2017 EXAM: CT ABDOMEN AND PELVIS WITHOUT CONTRAST TECHNIQUE: Multidetector CT imaging of the abdomen and pelvis was performed following the standard protocol without IV contrast. COMPARISON:  CT 10/13/2015 FINDINGS: Lower chest: Lung bases are clear. Hepatobiliary: No focal hepatic lesion. No biliary duct dilatation. Gallbladder is normal. Common bile duct is normal. Pancreas: Pancreas is normal. No ductal dilatation. No pancreatic inflammation. Spleen: Normal spleen Adrenals/urinary tract: Adrenal glands are normal. Kidneys are normal without obstruction. There is gas within bladder. There is Foley catheter  expanded within the prostatic urethra of the prostate gland (image 63, series 6). There is gas within the prostate gland anterior to the Foley bulb in posterior to the bulb surrounding the urethra. Gas-filled track between the  anterior wall the rectum and the prostate gland / urethra (image 76, series 2). Small gas within the anterior wall of the rectum (image 74, series 2). Additionally there is a tract leading inferiorly from the prostate gland/ urethra the LEFT perineum (image 85 and 90. Series 2). There small abscess at the base the bladder base the penis measuring 2.9 by 1.7 cm. Stomach/Bowel: Stomach, small-bowel, appendix cecum normal. The colon rectosigmoid colon normal. Gas and anterior wall the rectum and potential communication with the prostate gland/3 described above. No abscess formation within the pelvis peritoneal or retroperitoneal space. Vascular/Lymphatic: Abdominal aorta is normal caliber. There is no retroperitoneal or periportal lymphadenopathy. No pelvic lymphadenopathy. Reproductive: Prostate gland as described above within the bladder section Other: No free fluid. Musculoskeletal: No aggressive osseous lesion IMPRESSION: 1. 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 2. Extensive gas within the prostate gland. The Foley catheter bulb is expanded within the prostatic urethra. Recommend deflation and advancement of the Foley bulb into the lumen of the bladder. 3. Communication between the prostate gland / prostatic urethra / rectum and the base the penis and LEFT perineum consistent with a fistulous track and abscess formation. 4. Recommend urology consultation. Findings conveyed toKATHLEEN MCMANUS on 04/18/2017  at21:48. Electronically Signed   By: Suzy Bouchard M.D.   On: 04/18/2017 21:48   Ct Head Wo Contrast  Result Date: 04/18/2017 CLINICAL DATA:  Confusion and altered mental status. EXAM: CT HEAD WITHOUT CONTRAST TECHNIQUE: Contiguous axial images were obtained from the base of the skull through the vertex without intravenous contrast. COMPARISON:  None. FINDINGS: Brain: There is an  age-indeterminate left frontoparietal subdural hematoma, measuring 10 mm in maximum thickness. No evidence of significant midline shift. No evidence of acute infarction or hydrocephalus. Moderate brain parenchymal volume loss and periventricular microangiopathy. Vascular: No hyperdense vessel or unexpected calcification. Skull: Normal. Negative for fracture or focal lesion. Sinuses/Orbits: No acute finding. Other: None. IMPRESSION: Age-indeterminate left frontoparietal subdural hematoma measuring 10 mm in maximum thickness. No significant midline shift. Moderate brain parenchymal volume loss and periventricular microangiopathy. These results were called by telephone at the time of interpretation on 04/18/2017 at 9:33 pm to Dr. Francine Graven , who verbally acknowledged these results. Electronically Signed   By: Fidela Salisbury M.D.   On: 04/18/2017 21:41   US Abdomen Complete  Result Date: 04/19/2017 CLINICAL DATA:  Initial evaluation for acute abdominal pain for 1 day. EXAM: ABDOMEN ULTRASOUND COMPLETE COMPARISON:  Prior CT from 04/18/2017. FINDINGS: Gallbladder: Shadowing echogenic calculi present within the gallbladder lumen, largest of which measures 1.1 cm. Gallbladder wall measure within normal limits at 2.6 mm. No free pericholecystic fluid. No sonographic Murphy sign elicited on exam. Common bile duct: Diameter: 5.8 mm Liver: No focal lesion identified. Within normal limits in parenchymal echogenicity. Portal vein is patent on color Doppler imaging with normal direction of blood flow towards the liver. IVC: No abnormality visualized. Pancreas: Visualized portion unremarkable. Spleen: Size and appearance within normal limits. Right Kidney: Length: 12.2 cm. Echogenicity within normal limits. No solid mass visualized. Mild hydronephrosis. 1.8 x 1.2 x 2.0 cm cyst noted. Cortical thinning noted. Left Kidney: Length: 10.6 cm. Echogenicity within  normal limits. No solid mass visualized. Mild  hydronephrosis. Cortical thinning noted. Abdominal aorta: No aneurysm visualized. Other findings: None. IMPRESSION: 1. Cholelithiasis with no other sonographic features to suggest acute cholecystitis. No biliary dilatation. 2. Mild bilateral hydronephrosis. 3. Chronic renal cortical thinning bilaterally. Electronically Signed   By: Jeannine Boga M.D.   On: 04/19/2017 05:44   Dg Chest Port 1 View  Result Date: 04/18/2017 CLINICAL DATA:  Diarrhea and confusion.  Altered mental status. EXAM: PORTABLE CHEST 1 VIEW COMPARISON:  12/28/2009 FINDINGS: A single AP portable view of the chest demonstrates no focal airspace consolidation or alveolar edema. The lungs are grossly clear. There is no large effusion or pneumothorax. Cardiac and mediastinal contours appear unremarkable. IMPRESSION: No active disease. Electronically Signed   By: Andreas Newport M.D.   On: 04/18/2017 20:44    Review of Systems  Unable to perform ROS: Mental status change   Blood pressure 103/68, pulse (!) 101, temperature (!) 97.3 F (36.3 C), resp. rate 15, height _0  (1.676 m), weight 67.5 kg (148 lb 13 oz), SpO2 100 %. Physical Exam  Constitutional: He appears lethargic.  HENT:  Head: Normocephalic and atraumatic.  Eyes: No scleral icterus.  Neck: Neck supple.  Cardiovascular: Normal rate, regular rhythm and normal heart sounds.   Respiratory: Breath sounds normal. He has no wheezes.  GI: Soft.  Mildly tender bilateral lower quadrants  Lymphadenopathy:    He has no cervical adenopathy.  Neurological: He appears lethargic.  Skin: He is diaphoretic.    Assessment/Plan: Prostatic/urethral fistula  He likely needs to have fecal stream diverted with colostomy at some point.  I dont think this is urgent right now and continued resuscitation/abx would be best initial plan. I think taking to or right now has certainly some increased risk for intraabdominal operation and is not urgent as long as continues to improve  with abx.  Will discuss with Dr Alinda Money and will follow  [facility] 04/19/2017, 8:11 AM

## 2017-04-19 NOTE — Progress Notes (Signed)
[place] Progress Note Patient Name: Colin [name] DOB: 1940-08-07 MRN: [id number]   Date of Service  04/19/2017  HPI/Events of Note  Low potassium  eICU Interventions  replaced     Intervention Category Minor Interventions: Electrolytes abnormality - evaluation and management  Mauri Brooklyn, P 04/19/2017, 8:38 PM

## 2017-04-19 NOTE — Progress Notes (Signed)
Pharmacy Antibiotic Note  Colin [name] is a 76 y.o. male admitted on 04/18/2017 with urethral/rectal fistula.  Pharmacy has been consulted for Vancomycin and Meropenem dosing.  Vancomycin 1 g IV given in ED at  2200 10/12  Plan: Vancomycin 500 mg IV q48h Meropenem 500 mg IV q12h  Height: 5\' 6"  (167.6 cm) Weight: 148 lb 13 oz (67.5 kg) IBW/kg (Calculated) : 63.8  Temp (24hrs), Avg:95.5 F (35.3 C), Min:93 F (33.9 C), Max:97 F (36.1 C)   Recent Labs Lab 04/18/17 1909 04/18/17 2116 04/19/17 0342  WBC 41.2*  --  25.6*  CREATININE 4.26*  --  3.71*  LATICACIDVEN 1.2 0.9  --     Estimated Creatinine Clearance: 15.5 mL/min (A) (by C-G formula based on SCr of 3.71 mg/dL (H)).    No Known Allergies   Caryl Pina 04/19/2017 6:05 AM

## 2017-04-19 NOTE — Progress Notes (Signed)
NP [facility] alerted of critical values of ABG

## 2017-04-19 NOTE — Progress Notes (Addendum)
Patient ID: CARLESTER KASPAREK, male   DOB: 28-May-1941, 76 y.o.   MRN: [id number]   I spoke with Dr. Donne Hazel this morning.  General Surgery plan is to wait for patient to be stabilized with plans for eventual colostomy diversion possibly next week.  CT of the pelvis demonstrates catheter still in prostatic urethra after new catheter placed by me this morning.    The patient's indwelling coude catheter was therefore removed.  Procedure:  Surgeon: Dr. Raynelle Bring Resident: Dr. Jeralyn Ruths    The patient was sterilely prepped and draped.  I performed flexible cystoscopy that demonstrated a normal anterior urethra. The prostatic urethra beyond the verumontanum was completely necrotic with no clear lumen into the bladder noted.  There was stool along with necrotic material in a large cavity within the prostatic urethra.  With additional manipulation utilizing a guidewire, the cystoscope was advanced into what was felt to be the bladder.  Mucosa was identified with numerous diverticuli noted.  It was impossible to definitively determine if the scope was in the bladder or not.   A 20 Fr council catheter was then advanced over the wire.Irrigation of the catheter was intermittent.  We then performed bedside ultrasound and we could identify the catheter balloon but did not see obvious bladder surrounding the balloon.  We then performed a digital rectal exam and could feel the fistula tract and the catheter was definitely not in the rectum confirming it must be in the bladder lumen. The procedure was then ended.  There was a mild amount of uriniferous drainage although still brown/black tinged.   Plan:  Recommend leaving indwelling catheter in place which should offer adequate drainage of the urinary tract.  Will allow patient to stabilize.  If he does not, bilateral percutaneous nephrostomy tubes for proximal drainage may be indicated as SP tube placement outside of the OR will not likely be possible as bladder is  not distended, and I do not think patient will currently be best served by undergoing an anesthetic right now (agree with general surgery).  Assuming he improves, will communicate with general surgery about a combined procedure next week for colostomy and SP tube placement in the OR along with possible drainage of periurethral/perineal abscess if still present (will likely need repeat CT prior to OR to evaluate).  Continue broad spectrum IV antibiotics.

## 2017-04-20 DIAGNOSIS — A419 Sepsis, unspecified organism: Secondary | ICD-10-CM

## 2017-04-20 DIAGNOSIS — L02215 Cutaneous abscess of perineum: Secondary | ICD-10-CM

## 2017-04-20 DIAGNOSIS — R195 Other fecal abnormalities: Secondary | ICD-10-CM

## 2017-04-20 DIAGNOSIS — R197 Diarrhea, unspecified: Secondary | ICD-10-CM

## 2017-04-20 DIAGNOSIS — R7881 Bacteremia: Secondary | ICD-10-CM

## 2017-04-20 DIAGNOSIS — R6521 Severe sepsis with septic shock: Secondary | ICD-10-CM

## 2017-04-20 DIAGNOSIS — N179 Acute kidney failure, unspecified: Secondary | ICD-10-CM

## 2017-04-20 DIAGNOSIS — A4101 Sepsis due to Methicillin susceptible Staphylococcus aureus: Secondary | ICD-10-CM

## 2017-04-20 DIAGNOSIS — N4289 Other specified disorders of prostate: Secondary | ICD-10-CM

## 2017-04-20 LAB — CBC
HCT: 25.1 % — ABNORMAL LOW (ref 39.0–52.0)
Hemoglobin: 8.3 g/dL — ABNORMAL LOW (ref 13.0–17.0)
MCH: 26.3 pg (ref 26.0–34.0)
MCHC: 33.1 g/dL (ref 30.0–36.0)
MCV: 79.4 fL (ref 78.0–100.0)
Platelets: 379 10*3/uL (ref 150–400)
RBC: 3.16 MIL/uL — ABNORMAL LOW (ref 4.22–5.81)
RDW: 22.5 % — ABNORMAL HIGH (ref 11.5–15.5)
WBC: 20.5 10*3/uL — ABNORMAL HIGH (ref 4.0–10.5)

## 2017-04-20 LAB — GLUCOSE, CAPILLARY
GLUCOSE-CAPILLARY: 116 mg/dL — AB (ref 65–99)
GLUCOSE-CAPILLARY: 123 mg/dL — AB (ref 65–99)
GLUCOSE-CAPILLARY: 132 mg/dL — AB (ref 65–99)
GLUCOSE-CAPILLARY: 114 mg/dL — AB (ref 65–99)
Glucose-Capillary: 112 mg/dL — ABNORMAL HIGH (ref 65–99)

## 2017-04-20 LAB — BASIC METABOLIC PANEL
Anion gap: 13 (ref 5–15)
BUN: 116 mg/dL — AB (ref 6–20)
CHLORIDE: 116 mmol/L — AB (ref 101–111)
CO2: 14 mmol/L — AB (ref 22–32)
Calcium: 8.2 mg/dL — ABNORMAL LOW (ref 8.9–10.3)
Creatinine, Ser: 2.41 mg/dL — ABNORMAL HIGH (ref 0.61–1.24)
GFR calc non Af Amer: 25 mL/min — ABNORMAL LOW (ref >60)
GFR, EST AFRICAN AMERICAN: 29 mL/min — AB (ref >60)
Glucose, Bld: 123 mg/dL — ABNORMAL HIGH (ref 65–99)
POTASSIUM: 2.8 mmol/L — AB (ref 3.5–5.1)
SODIUM: 143 mmol/L (ref 135–145)

## 2017-04-20 LAB — MAGNESIUM: MAGNESIUM: 2.1 mg/dL (ref 1.7–2.4)

## 2017-04-20 LAB — PROCALCITONIN: Procalcitonin: 7.3 ng/mL

## 2017-04-20 LAB — PHOSPHORUS: PHOSPHORUS: 2.3 mg/dL — AB (ref 2.5–4.6)

## 2017-04-20 MED ORDER — FENTANYL CITRATE (PF) 100 MCG/2ML IJ SOLN
12.5000 ug | INTRAMUSCULAR | Status: DC | PRN
  Administered 2017-04-20 (×2): 12.5 ug via INTRAVENOUS
  Filled 2017-04-20 (×2): qty 2

## 2017-04-20 MED ORDER — HALOPERIDOL LACTATE 5 MG/ML IJ SOLN
INTRAMUSCULAR | Status: AC
  Administered 2017-04-20: 1 mg via INTRAVENOUS
  Filled 2017-04-20: qty 1

## 2017-04-20 MED ORDER — VANCOMYCIN HCL IN DEXTROSE 750-5 MG/150ML-% IV SOLN
750.0000 mg | INTRAVENOUS | Status: DC
  Administered 2017-04-20 – 2017-04-21 (×2): 750 mg via INTRAVENOUS
  Filled 2017-04-20 (×3): qty 150

## 2017-04-20 MED ORDER — FENTANYL CITRATE (PF) 100 MCG/2ML IJ SOLN
25.0000 ug | INTRAMUSCULAR | Status: DC | PRN
  Administered 2017-04-21 – 2017-04-23 (×8): 50 ug via INTRAVENOUS
  Filled 2017-04-20 (×8): qty 2

## 2017-04-20 MED ORDER — HALOPERIDOL LACTATE 5 MG/ML IJ SOLN
1.0000 mg | Freq: Once | INTRAMUSCULAR | Status: AC
  Administered 2017-04-20: 1 mg via INTRAVENOUS

## 2017-04-20 MED ORDER — POTASSIUM CHLORIDE 10 MEQ/100ML IV SOLN
10.0000 meq | INTRAVENOUS | Status: AC
  Administered 2017-04-20 (×6): 10 meq via INTRAVENOUS
  Filled 2017-04-20 (×4): qty 100

## 2017-04-20 MED ORDER — HYDROCODONE-ACETAMINOPHEN 5-325 MG PO TABS: 1.0000 | ORAL_TABLET | ORAL | Status: DC | PRN

## 2017-04-20 NOTE — Progress Notes (Signed)
PULMONARY / CRITICAL CARE MEDICINE   Name: Colin [name] MRN: [id number] DOB: 07-Mar-1941    ADMISSION DATE:  04/18/2017 CONSULTATION DATE:  04/19/2017  REFERRING MD:  Dr. Thurnell Garbe   CHIEF COMPLAINT:  Urethral/Rectal Fistula   HISTORY OF PRESENT ILLNESS:   76 year old male with PMH of stage 3 CKD, ETOH, Hepatitis, Hepatitis C, HTN, Anemia, Prostate Cancer, DM type 2   Presents to [facility] ED on 10/12 with reported "black" stool for one month in which has progressively worsened over the last 3 days. Hemoglobin 8.8, WBC 41.2. 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 Foley was placed with black stool output noted. Surgery and Urology consulted. Patient transferred to [facility] for further management.   PAST MEDICAL HISTORY :  He  has a past medical history of Arthritis; Bilateral lower extremity edema; Chronic venous insufficiency; CKD (chronic kidney disease), stage III ([place]); History of alcohol abuse; History of chronic hepatitis; History of external beam radiation therapy; History of intravenous drug use in remission; HOH (hard of hearing); Hypertension; Normocytic anemia; Prostate cancer ([place]); Recurrent prostate cancer [facility]) (urologist-  dr Junious Silk); and Type 2 diabetes, diet controlled ([place]).  PAST SURGICAL HISTORY: He  has a past surgical history that includes Endovenous ablation saphenous vein w/ laser (2013); Cataract extraction w/PHACO (Left, 12/14/2012); Cataract extraction w/PHACO (Right, 12/28/2012); Colonoscopy (N/A, 02/14/2014); Esophagogastroduodenoscopy (N/A, 02/14/2014); Total knee arthroplasty (Bilateral, left 2007/   right 2005 approx); Total knee  prosthesis removal w/ spacer insertion (Left, 12/29/2009); Reimplantation of total knee (Left, 03/16/2010); Tonsillectomy (child); and Cryoablation (N/A, 05/24/2016).  No Known Allergies  No  current facility-administered medications on file prior to encounter.    Current Outpatient Prescriptions on File Prior to Encounter  Medication Sig  . cephALEXin (KEFLEX) 500 MG capsule Take 1 capsule (500 mg total) by mouth 2 (two) times daily.  Marland Kitchen HYDROcodone-acetaminophen (LORCET HD) 10-325 MG per tablet Take 1 tablet by mouth every 6 (six) hours as needed for pain.  . Multiple Vitamins-Minerals (MULTIVITAMIN WITH MINERALS) tablet Take 1 tablet by mouth daily.    Marland Kitchen torsemide (DEMADEX) 20 MG tablet Take 2 tablets by mouth 4 (four) times daily.     FAMILY HISTORY:  His indicated that his mother is deceased. He indicated that his father is deceased. He indicated that his brother is alive. He indicated that the status of his neg hx is unknown.    SOCIAL HISTORY: He  reports that he quit smoking about 44 years ago. He quit after 12.00 years of use. He has never used smokeless tobacco. He reports that he does not drink alcohol or use drugs.  REVIEW OF SYSTEMS:   Unable to review as patient is encephalopathic   SUBJECTIVE:  No major issues overnight Foley placed by urology.  VITAL SIGNS: BP (!) 80/56   Pulse 91   Temp 97.6 F (36.4 C) (Oral)   Resp (!) 9   Ht 5\' 6"  (1.676 m)   Wt 152 lb 1.9 oz (69 kg)   SpO2 99%   BMI 24.55 kg/m   HEMODYNAMICS:    VENTILATOR SETTINGS:    INTAKE / OUTPUT: I/O last 3 completed shifts: In: 8197.5 [I.V.:2797.5; IV Piggyback:5400] Out: 6195 [Urine:1101; Stool:3]  PHYSICAL EXAMINATION: Gen:      Awake, moaning HEENT:  EOMI, sclera anicteric Neck:     No masses; no thyromegaly Lungs:  Clear to auscultation bilaterally; normal respiratory effort CV:         Regular rate and rhythm; no murmurs Abd:      + bowel sounds; soft, non-tender; no palpable masses, no distension Ext:    1-2 edema. Rt leg erythema Skin:      Warm and dry; no rash Neuro: Moves all extremities. No focal deficits   LABS:  BMET  Recent Labs Lab 04/19/17 0342  04/19/17 1929 04/20/17 0531  NA 141 141 143  K 2.8* 2.4* 2.8*  CL 114* 116* 116*  CO2 8* 12* 14*  BUN 136* 123* 116*  CREATININE 3.71* 2.61* 2.41*  GLUCOSE 120* 136* 123*    Electrolytes  Recent Labs Lab 04/19/17 0342 04/19/17 1929 04/20/17 0531  CALCIUM 8.5* 8.0* 8.2*  MG 2.6*  --  2.1  PHOS 4.4  --  2.3*    CBC  Recent Labs Lab 04/19/17 1441 04/19/17 1929 04/20/17 0531  WBC 22.8* 21.2* 20.5*  HGB 8.0* 7.8* 8.3*  HCT 23.6* 23.2* 25.1*  PLT 349 369 379    Coag's  Recent Labs Lab 04/18/17 1909  INR 1.26    Sepsis Markers  Recent Labs Lab 04/18/17 1909 04/18/17 2116 04/19/17 0342 04/20/17 0531  LATICACIDVEN 1.2 0.9  --   --   PROCALCITON  --   --  3.25 7.30    ABG  Recent Labs Lab 04/18/17 2230 04/19/17 0345  PHART 7.266* 7.285*  PCO2ART CRITICAL RESULT CALLED TO, READ BACK BY AND VERIFIED WITH: VALUE BELOW REPORTABLE RANGE  PO2ART 129.0* 126.00*    Liver Enzymes  Recent Labs Lab 04/18/17 1909  AST 50*  ALT 28  ALKPHOS 223*  BILITOT 0.7  ALBUMIN 2.7*    Cardiac Enzymes  Recent Labs Lab 04/18/17 1909 04/19/17 0443 04/19/17 1441  TROPONINI 0.04* 0.05* 0.04*    Glucose  Recent Labs Lab 04/19/17 1207 04/19/17 1606 04/19/17 2016 04/19/17 2348 04/20/17 0356 04/20/17 0805  GLUCAP 117* 121* 129* 92 116* 123*    Imaging Ct Pelvis Wo Contrast  Result Date: 04/19/2017 CLINICAL DATA:  Rectourethral fistula, evaluate Foley catheter placement EXAM: CT PELVIS WITHOUT CONTRAST TECHNIQUE: Multidetector CT imaging of the pelvis was performed following the standard protocol without intravenous contrast. COMPARISON:  04/18/2017 FINDINGS: Urinary Tract: Thick-walled bladder with nondependent gas (Series 3/image 29). Bowel:  Visualized bowel is grossly unremarkable. Vascular/Lymphatic: No evidence of aneurysm. Mild atherosclerotic calcifications. No suspicious pelvic lymphadenopathy. Reproductive: Necrotic prostate bed status post  cryoablation with trace gas anteriorly (series 3/ image 36). Foley catheter bulb inferiorly along the prostate bed (presumably in the prostatic urethra). Other: Known fistulous tract between the anterior rectum, necrotic prostate bed, and posterior bladder wall is not well visualized on the current study, likely due to the presence of the Foley catheter bulb in this location (series 3/ image 37). Again noted is fluid/phlegmonous change along the left perineum measuring approximately 3.2 x 2.3 cm (series 3/ image 49). Associated stranding and soft tissue gas in the left perineal region (series 3/image 53), suggesting cellulitis. Musculoskeletal: Degenerative changes of the lower lumbar spine. IMPRESSION: Malpositioned Foley catheter pole within the prostatic urethra along the inferior necrotic prostate bed. Known rectal urethral fistulous tract is obscured by the presence of the Foley catheter bulb in this location. Again noted is fluid/phlegmonous change with soft tissue gas along the left perineum, as described above, suggesting cellulitis. Electronically Signed   By: Julian Hy M.D.   On: 04/19/2017 09:33    STUDIES:  CXR 10/12 > The lungs are grossly clear. There is no large effusion or pneumothorax. Cardiac and mediastinal contours appear unremarkable. CT Head 10/12 > Age-indeterminate left frontoparietal subdural hematoma measuring 10 mm in maximum thickness. No significant midline shift. Moderate brain parenchymal volume loss and periventricular microangiopathy. CT A/P 10/12 > 1. 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 2. Extensive gas within the prostate gland. The Foley catheter bulb is expanded within the prostatic urethra. Recommend deflation and advancement of the Foley bulb into the lumen of the bladder. 3. Communication between the prostate gland / prostatic urethra  / rectum and the base the penis and LEFT perineum consistent with a fistulous track and abscess formation. 4. Recommend urology consultation LE duplex 10/13 > No DVT  CULTURES: Blood 10/12 >> GPC Urine 10/12 >>   ANTIBIOTICS: Vancomycin 10/12 >> Meropenem 10/12 >>   SIGNIFICANT EVENTS: 10/12 > Presents to ED   LINES/TUBES: Foley 10/12 >>  DISCUSSION: 76 year old male presents to ED with black tarry stool. Found to have fistula. Transferred to [facility] for further management.   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 Advance diet as tolerated PPI   HEMATOLOGIC A:   Blood Loss Anemia  P:  Trend CBC q6h  Transfuse for Hemoglobin <7  Trend INR   INFECTIOUS A:   Leukocytosis  ?Cellulitis to Right LE  H/O Recent Staph UTI  P:   Trend WBC and Fever Curve  Trend PCT Follow Culture Data  Continue Meropenem and Vancomycin   ENDOCRINE A:   H/O DM  P:   Trend Glucose  SSI   NEUROLOGIC A:   Left Subdural Hematoma  Metabolic Encephalopathy  H/O ETOH (stopped 2013), 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) P:   Neurosurgery Consulted > Believes SDH is old  Monitor  Avoid Sedative Medications   FAMILY  - Updates: no family at bedside  - Inter-disciplinary family meet or Palliative Care meeting due by:  04/26/2017   Stable for transfer to SDU.  Marshell Garfinkel MD  Pulmonary and Critical Care Pager  [phone] If no answer or after 3pm call: [phone] 04/20/2017, 8:44 AM

## 2017-04-20 NOTE — Progress Notes (Signed)
Pharmacy Antibiotic Note  Colin [name] is a 76 y.o. male admitted on 04/18/2017 with urethral/rectal fistula.  Pharmacy has been consulted for vancomycin and meropenem dosing. Pt is hypothermic. WBC is down to 20.5 and SCr is also trending down to 2.41.   Plan: Change vancomycin to 750mg  IV Q24H Continue meropenem 500mg  IV Q12H F/u renal fxn, C&S, clinical status and trough at SS  Height: 5\' 6"  (167.6 cm) Weight: 152 lb 1.9 oz (69 kg) IBW/kg (Calculated) : 63.8  Temp (24hrs), Avg:97.9 F (36.6 C), Min:97.4 F (36.3 C), Max:98.2 F (36.8 C)   Recent Labs Lab 04/18/17 1909 04/18/17 2116 04/19/17 0342 04/19/17 1441 04/19/17 1929 04/20/17 0531  WBC 41.2*  --  25.6* 22.8* 21.2* 20.5*  CREATININE 4.26*  --  3.71*  --  2.61* 2.41*  LATICACIDVEN 1.2 0.9  --   --   --   --     Estimated Creatinine Clearance: 23.9 mL/min (A) (by C-G formula based on SCr of 2.41 mg/dL (H)).    No Known Allergies  Antimicrobials this admission: Vanc 10/12>> Meropenem 10/12>>  Microbiology results: 10/13 MRSA - NEG 10/12 Blood - MRSA 1/2 10/12 CDiff - NEG  Thank you for allowing pharmacy to be a part of this patient's care.  Lashaun Krapf, Rande Lawman 04/20/2017 11:01 AM

## 2017-04-20 NOTE — Progress Notes (Signed)
[facility] Progress Note  Subjective: making urine , ~ 1 L yest, creat down today  Vitals:   04/20/17 0600 04/20/17 0700 04/20/17 0800 04/20/17 0802  BP: (!) 80/56 99/62 (!) 93/54   Pulse: 91 99 99   Resp: (!) '9 20 12   ' Temp:    97.6 F (36.4 C)  TempSrc:    Oral  SpO2: 99% 100% 100%   Weight:      Height:        Inpatient medications: . insulin aspart  2-6 Units Subcutaneous Q4H   . sodium chloride    . meropenem (MERREM) IV 500 mg (04/20/17 0959)  . potassium chloride 10 mEq (04/20/17 0959)  . sodium chloride Stopped (04/18/17 2213)  . [START ON 04/21/2017] vancomycin     sodium chloride, fentaNYL (SUBLIMAZE) injection  Exam: Gen HOH, responds to voice Throat dry No jvd or bruits, flat neck veins Chest clear bilat RRR no MRG Abd soft ntnd no mass or ascites +bs GU normal male, foley in place 1-2+ bilat LE edema Bilat LE chronic skin changes Neuro is HOH, responsive   CXR 10/12 - no active disease  Home meds:  Keflex/ fe/ lorcet/ MVI/ demadex 20 qid  Impression: 1. AKI/ on CKD3 - baseline creat 1.4- 1.8 from Nov 2017.  Creat improving w IVF"s. No other suggestions, will sign off.  2. 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 Met acidosis - improving 4. Volume - has LE edema (venous insuff vs RHF vs other) but intravasc vol is low.  5. Hypothermia - getting empiric IV abx, looks like MRSA bacteremia 6. Hep C 7. Prostate cancer 8. HOH  Plan - as above   Kelly Splinter MD [facility] pager [phone]   04/20/2017, 10:09 AM    Recent Labs Lab 04/19/17 0342 04/19/17 1929 04/20/17 0531  NA 141 141 143  K 2.8* 2.4* 2.8*  CL 114* 116* 116*  CO2 8* 12* 14*  GLUCOSE 120* 136* 123*  BUN 136* 123* 116*  CREATININE 3.71* 2.61* 2.41*  CALCIUM 8.5* 8.0* 8.2*  PHOS 4.4  --  2.3*    Recent Labs Lab 04/18/17 1909  AST 50*  ALT 28  ALKPHOS 223*  BILITOT 0.7  PROT 7.7   ALBUMIN 2.7*    Recent Labs Lab 04/18/17 1909  04/19/17 1441 04/19/17 1929 04/20/17 0531  WBC 41.2*  < > 22.8* 21.2* 20.5*  NEUTROABS 39.3*  --   --   --   --   HGB 8.8*  < > 8.0* 7.8* 8.3*  HCT 26.3*  < > 23.6* 23.2* 25.1*  MCV 82.7  < > 79.5 79.2 79.4  PLT 404*  < > 349 369 379  < > = values in this interval not displayed. Iron/TIBC/Ferritin/ %Sat    Component Value Date/Time   IRON 37 (L) 01/29/2016 1017   TIBC 295 01/29/2016 1017   FERRITIN 75 01/29/2016 1017   IRONPCTSAT 13 (L) 01/29/2016 1017

## 2017-04-20 NOTE — Progress Notes (Signed)
Subjective/Chief Complaint: Moaning, cant hear   Objective: Vital signs in last 24 hours: Temp:  [97.2 F (36.2 C)-98.2 F (36.8 C)] 97.6 F (36.4 C) (10/14 0802) Pulse Rate:  [72-153] 99 (10/14 0700) Resp:  [8-20] 12 (10/14 0800) BP: (76-125)/(41-80) 93/54 (10/14 0800) SpO2:  [97 %-100 %] 100 % (10/14 0700) Weight:  [69 kg (152 lb 1.9 oz)] 69 kg (152 lb 1.9 oz) (10/14 0500) Last BM Date: 04/19/17  Intake/Output from previous day: 10/13 0701 - 10/14 0700 In: 4675 [I.V.:1875; IV Piggyback:2800] Out: 1103 [Urine:1101; Stool:2] Intake/Output this shift: No intake/output data recorded.  GI: soft nt/nd  Lab Results:   Recent Labs  04/19/17 1929 04/20/17 0531  WBC 21.2* 20.5*  HGB 7.8* 8.3*  HCT 23.2* 25.1*  PLT 369 379   BMET  Recent Labs  04/19/17 1929 04/20/17 0531  NA 141 143  K 2.4* 2.8*  CL 116* 116*  CO2 12* 14*  GLUCOSE 136* 123*  BUN 123* 116*  CREATININE 2.61* 2.41*  CALCIUM 8.0* 8.2*   PT/INR  Recent Labs  04/18/17 1909  LABPROT 15.7*  INR 1.26   ABG  Recent Labs  04/18/17 2230 04/19/17 0345  PHART 7.266* 7.285*  HCO3 CRITICAL RESULT CALLED TO, READ BACK BY AND VERIFIED WITH: 6.1*    Studies/Results: Ct Abdomen Pelvis Wo Contrast  Result Date: 04/18/2017 CLINICAL DATA:  Black diarrhea. Prostate cancer status post cryoablation and November 2017 EXAM: CT ABDOMEN AND PELVIS WITHOUT CONTRAST TECHNIQUE: Multidetector CT imaging of the abdomen and pelvis was performed following the standard protocol without IV contrast. COMPARISON:  CT 10/13/2015 FINDINGS: Lower chest: Lung bases are clear. Hepatobiliary: No focal hepatic lesion. No biliary duct dilatation. Gallbladder is normal. Common bile duct is normal. Pancreas: Pancreas is normal. No ductal dilatation. No pancreatic inflammation. Spleen: Normal spleen Adrenals/urinary tract: Adrenal glands are normal. Kidneys are normal without obstruction. There is gas within bladder. There is  Foley catheter expanded within the prostatic urethra of the prostate gland (image 63, series 6). There is gas within the prostate gland anterior to the Foley bulb in posterior to the bulb surrounding the urethra. Gas-filled track between the anterior wall the rectum and the prostate gland / urethra (image 76, series 2). Small gas within the anterior wall of the rectum (image 74, series 2). Additionally there is a tract leading inferiorly from the prostate gland/ urethra the LEFT perineum (image 85 and 90. Series 2). There small abscess at the base the bladder base the penis measuring 2.9 by 1.7 cm. Stomach/Bowel: Stomach, small-bowel, appendix cecum normal. The colon rectosigmoid colon normal. Gas and anterior wall the rectum and potential communication with the prostate gland/3 described above. No abscess formation within the pelvis peritoneal or retroperitoneal space. Vascular/Lymphatic: Abdominal aorta is normal caliber. There is no retroperitoneal or periportal lymphadenopathy. No pelvic lymphadenopathy. Reproductive: Prostate gland as described above within the bladder section Other: No free fluid. Musculoskeletal: No aggressive osseous lesion IMPRESSION: 1. 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 2. Extensive gas within the prostate gland. The Foley catheter bulb is expanded within the prostatic urethra. Recommend deflation and advancement of the Foley bulb into the lumen of the bladder. 3. Communication between the prostate gland / prostatic urethra / rectum and the base the penis and LEFT perineum consistent with a fistulous track and abscess formation. 4. Recommend urology consultation. Findings conveyed toKATHLEEN MCMANUS on  04/18/2017  at21:48. Electronically Signed   By: Suzy Bouchard M.D.   On: 04/18/2017 21:48   Ct Head Wo Contrast  Result Date: 04/18/2017 CLINICAL DATA:   Confusion and altered mental status. EXAM: CT HEAD WITHOUT CONTRAST TECHNIQUE: Contiguous axial images were obtained from the base of the skull through the vertex without intravenous contrast. COMPARISON:  None. FINDINGS: Brain: There is an age-indeterminate left frontoparietal subdural hematoma, measuring 10 mm in maximum thickness. No evidence of significant midline shift. No evidence of acute infarction or hydrocephalus. Moderate brain parenchymal volume loss and periventricular microangiopathy. Vascular: No hyperdense vessel or unexpected calcification. Skull: Normal. Negative for fracture or focal lesion. Sinuses/Orbits: No acute finding. Other: None. IMPRESSION: Age-indeterminate left frontoparietal subdural hematoma measuring 10 mm in maximum thickness. No significant midline shift. Moderate brain parenchymal volume loss and periventricular microangiopathy. These results were called by telephone at the time of interpretation on 04/18/2017 at 9:33 pm to Dr. Francine Graven , who verbally acknowledged these results. Electronically Signed   By: Fidela Salisbury M.D.   On: 04/18/2017 21:41   Ct Pelvis Wo Contrast  Result Date: 04/19/2017 CLINICAL DATA:  Rectourethral fistula, evaluate Foley catheter placement EXAM: CT PELVIS WITHOUT CONTRAST TECHNIQUE: Multidetector CT imaging of the pelvis was performed following the standard protocol without intravenous contrast. COMPARISON:  04/18/2017 FINDINGS: Urinary Tract: Thick-walled bladder with nondependent gas (Series 3/image 29). Bowel:  Visualized bowel is grossly unremarkable. Vascular/Lymphatic: No evidence of aneurysm. Mild atherosclerotic calcifications. No suspicious pelvic lymphadenopathy. Reproductive: Necrotic prostate bed status post cryoablation with trace gas anteriorly (series 3/ image 36). Foley catheter bulb inferiorly along the prostate bed (presumably in the prostatic urethra). Other: Known fistulous tract between the anterior rectum,  necrotic prostate bed, and posterior bladder wall is not well visualized on the current study, likely due to the presence of the Foley catheter bulb in this location (series 3/ image 37). Again noted is fluid/phlegmonous change along the left perineum measuring approximately 3.2 x 2.3 cm (series 3/ image 49). Associated stranding and soft tissue gas in the left perineal region (series 3/image 53), suggesting cellulitis. Musculoskeletal: Degenerative changes of the lower lumbar spine. IMPRESSION: Malpositioned Foley catheter pole within the prostatic urethra along the inferior necrotic prostate bed. Known rectal urethral fistulous tract is obscured by the presence of the Foley catheter bulb in this location. Again noted is fluid/phlegmonous change with soft tissue gas along the left perineum, as described above, suggesting cellulitis. Electronically Signed   By: Julian Hy M.D.   On: 04/19/2017 09:33   US Abdomen Complete  Result Date: 04/19/2017 CLINICAL DATA:  Initial evaluation for acute abdominal pain for 1 day. EXAM: ABDOMEN ULTRASOUND COMPLETE COMPARISON:  Prior CT from 04/18/2017. FINDINGS: Gallbladder: Shadowing echogenic calculi present within the gallbladder lumen, largest of which measures 1.1 cm. Gallbladder wall measure within normal limits at 2.6 mm. No free pericholecystic fluid. No sonographic Murphy sign elicited on exam. Common bile duct: Diameter: 5.8 mm Liver: No focal lesion identified. Within normal limits in parenchymal echogenicity. Portal vein is patent on color Doppler imaging with normal direction of blood flow towards the liver. IVC: No abnormality visualized. Pancreas: Visualized portion unremarkable. Spleen: Size and appearance within normal limits. Right Kidney: Length: 12.2 cm. Echogenicity within normal limits. No solid mass visualized. Mild hydronephrosis. 1.8 x 1.2 x 2.0 cm cyst noted. Cortical thinning noted. Left Kidney: Length: 10.6 cm. Echogenicity within normal  limits. No solid mass visualized. Mild hydronephrosis. Cortical thinning noted. Abdominal aorta: No aneurysm visualized.  Other findings: None. IMPRESSION: 1. Cholelithiasis with no other sonographic features to suggest acute cholecystitis. No biliary dilatation. 2. Mild bilateral hydronephrosis. 3. Chronic renal cortical thinning bilaterally. Electronically Signed   By: Jeannine Boga M.D.   On: 04/19/2017 05:44   Dg Chest Port 1 View  Result Date: 04/18/2017 CLINICAL DATA:  Diarrhea and confusion.  Altered mental status. EXAM: PORTABLE CHEST 1 VIEW COMPARISON:  12/28/2009 FINDINGS: A single AP portable view of the chest demonstrates no focal airspace consolidation or alveolar edema. The lungs are grossly clear. There is no large effusion or pneumothorax. Cardiac and mediastinal contours appear unremarkable. IMPRESSION: No active disease. Electronically Signed   By: Andreas Newport M.D.   On: 04/18/2017 20:44    Anti-infectives: Anti-infectives    Start     Dose/Rate Route Frequency Ordered Stop   04/21/17 0800  vancomycin (VANCOCIN) IVPB 1000 mg/200 mL premix     1,000 mg 200 mL/hr over 60 Minutes Intravenous Every 48 hours 04/19/17 1109     04/19/17 1000  meropenem (MERREM) 500 mg in sodium chloride 0.9 % 50 mL IVPB     500 mg 100 mL/hr over 30 Minutes Intravenous Every 12 hours 04/19/17 0612     04/19/17 0800  vancomycin (VANCOCIN) 500 mg in sodium chloride 0.9 % 100 mL IVPB  Status:  Discontinued     500 mg 100 mL/hr over 60 Minutes Intravenous Every 48 hours 04/19/17 0612 04/19/17 1109   04/18/17 2215  vancomycin (VANCOCIN) IVPB 1000 mg/200 mL premix     1,000 mg 200 mL/hr over 60 Minutes Intravenous  Once 04/18/17 2209 04/18/17 2317   04/18/17 2215  meropenem (MERREM) 1 g in sodium chloride 0.9 % 100 mL IVPB     1 g 200 mL/hr over 30 Minutes Intravenous  Once 04/18/17 2209 04/18/17 2330      Assessment/Plan: Recto-prostatic fistula  He is improving medically and urology  believes is drained adequately now.  I think with continued resuscitation and discussion with family (no one hear right now) that sometime later this week he will likely need diverting stoma and suprapubic tube. Will continue to follow  [facility] 04/20/2017

## 2017-04-20 NOTE — Progress Notes (Signed)
CRITICAL VALUE ALERT  Critical Value:  K+ 2.4  Date & Time Notied:  04/19/2017 2020  Provider Notified: Bethann Humble. MD via [place]  Orders Received/Actions taken: orders to be received.

## 2017-04-20 NOTE — Progress Notes (Addendum)
Patient ID: Colin [name], male   DOB: [birth year], 76 y.o.   MRN: [id number]    Subjective: Pt moderately improved.  Renal function better.  Still with hypotension.  WBC improved.  Pt unable to answer questions as still with mental status change compared to baseline.  Objective: Vital signs in last 24 hours: Temp:  [97.4 F (36.3 C)-98.2 F (36.8 C)] 97.6 F (36.4 C) (10/14 0802) Pulse Rate:  [80-153] 99 (10/14 0800) Resp:  [8-20] 12 (10/14 0800) BP: (76-125)/(41-80) 93/54 (10/14 0800) SpO2:  [97 %-100 %] 100 % (10/14 0800) Weight:  [69 kg (152 lb 1.9 oz)] 69 kg (152 lb 1.9 oz) (10/14 0500)  Intake/Output from previous day: 10/13 0701 - 10/14 0700 In: 4675 [I.V.:1875; IV Piggyback:2800] Out: 1103 [Urine:1101; Stool:2] Intake/Output this shift: Total I/O In: 75 [I.V.:75] Out: -   Physical Exam:  General: Alert and oriented GU: Council catheter in place with uriniferous drainage still with evidence of liquid stool in catheter.  Lab Results:  Recent Labs  04/19/17 1441 04/19/17 1929 04/20/17 0531  HGB 8.0* 7.8* 8.3*  HCT 23.6* 23.2* 25.1*   CBC Latest Ref Rng & Units 04/20/2017 04/19/2017 04/19/2017  WBC 4.0 - 10.5 K/uL 20.5(H) 21.2(H) 22.8(H)  Hemoglobin 13.0 - 17.0 g/dL 8.3(L) 7.8(L) 8.0(L)  Hematocrit 39.0 - 52.0 % 25.1(L) 23.2(L) 23.6(L)  Platelets 150 - 400 K/uL 379 369 349     BMET  Recent Labs  04/19/17 1929 04/20/17 0531  NA 141 143  K 2.4* 2.8*  CL 116* 116*  CO2 12* 14*  GLUCOSE 136* 123*  BUN 123* 116*  CREATININE 2.61* 2.41*  CALCIUM 8.0* 8.2*     Studies/Results: Urine cultures pending  Assessment/Plan: 1. Prostatic urethra-rectal fistula:  Catheter in place as per yesterday's cystoscopic procedure and appears to be draining urine as expected.  Continue broad spectrum IV antibiotics (on Meropenem and Vancomycin) pending cultures.  Awaiting patient to stabilize for operative intervention for bowel and urinary diversion, probably later this  week.  Will need SP tube placed to more optimally divert urine.  This will need to be done in the OR due to the fact that bladder is not distended and will be best done at time of bowel diversion.   2. AKI: Resolving and nearing baseline function.  3. Periurethral abscess:  Should have repeat CT prior to OR later in the week to see if there is an abscess that would require drainage.   LOS: 2 days   Jennilyn Esteve,LES 04/20/2017, 10:20 AM

## 2017-04-20 NOTE — Consult Note (Signed)
Date of Admission:  04/18/2017          Reason for Consult: MRSA bacteremia   Referring Provider: Auto consult and Dr. Guadalupe Dawn   Assessment: 1. MRSA bacteremia  2. Prostatic urethra-rectal fistula 3. Sepsis 4. Hypothermia 5. Delirium 6. 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 R>> L leg edema  Plan: 1. Continue vancomycin for MRSA, but low threshold to change for cubicin if renal fxn worsens 2. Continue merrem as well given rectal-prostatic, urethral fistula 3. DC enteric precautions 4. Repeat blood cultures 5. TTE 6. DO NOT PLACE CENTRAL OR PICC LINE UNTIL WE HAVE CLEARED HIS BACTEREMIA 7. Duplex RLE 8. HIV, hep B, Hep A tests       [facility] Staphylococcus aureus bacteremia   Staphylococcus aureus bacteremia (SAB) is associated with a high rate of complications and mortality.  Specific aspects of clinical management are critical to optimizing the outcome of patients with SAB.  Therefore, the [facility] Antimicrobial Management Team [facility]) has initiated an intervention aimed at improving the management of SAB at [facility].  To do so, Infectious Diseases physicians are providing an evidence-based consult for the management of all patients with SAB.     Yes No Comments  Perform follow-up blood cultures (even if the patient is afebrile) to ensure clearance of bacteremia [x]  []    Remove vascular catheter and obtain follow-up blood cultures after the removal of the catheter [x]  []  AVOID CENTRAL LINES X 5 days  Perform echocardiography to evaluate for endocarditis (transthoracic ECHO is 40-50% sensitive, TEE is > 90% sensitive) [x]  []  Please keep in mind, that neither test can definitively EXCLUDE endocarditis, and that should clinical suspicion remain high for endocarditis the patient should then still be treated with an "endocarditis" duration of therapy = 6 weeks  Consult electrophysiologist to  evaluate implanted cardiac device (pacemaker, ICD) []  []    Ensure source control []  []  Have all abscesses been drained effectively? Have deep seeded infections (septic joints or osteomyelitis) had appropriate surgical debridement?  Investigate for "metastatic" sites of infection []  []  Does the patient have ANY symptom or physical exam finding that would suggest a deeper infection (back or neck pain that may be suggestive of vertebral osteomyelitis or epidural abscess, muscle pain that could be a symptom of pyomyositis)?  Keep in mind that for deep seeded infections MRI imaging with contrast is preferred rather than other often insensitive tests such as plain x-rays, especially early in a patient's presentation.  He is going for surgery with [facility] and Urology this week  Change antibiotic therapy to vancomycin and merrem (latter for coverage of gut flora) []  []  Beta-lactam antibiotics are preferred for MSSA due to higher cure rates.   If on Vancomycin, goal trough should be 15 - 20 mcg/mL  Estimated duration of IV antibiotic therapy:  Vancomycin likely 4-6 weeks []  []  Consult case management for probably prolonged outpatient IV antibiotic therapy     Active Problems:   Altered mental status   Acute renal failure (HCC)   Metabolic acidosis   Melena   Scheduled Meds: . insulin aspart  2-6 Units Subcutaneous Q4H   Continuous Infusions: . sodium chloride    . meropenem (MERREM) IV Stopped (04/20/17 1029)  . potassium chloride 10 mEq (04/20/17 1129)  . sodium chloride Stopped (04/18/17 2213)  . vancomycin 750 mg (04/20/17 1132)   PRN Meds:.sodium chloride, fentaNYL (SUBLIMAZE) injection  HPI:  Colin [name] is a 76 y.o. male with past medical history significant for alcoholism chronic kidney disease chronic hepatitis C without hepatic, status post cure, prostate cancer. He presented to [facility] emerge department the 12th with black stools for a month. CT of the abdomen pelvis performed there  showed a fistula between his prostate gland prostatic urethra and anterior wall of the rectum with gas-filled track between the prostate gland and the rectum. Foley was placed which then yielded frankly black stool. Urology and surgery were consulted and he was sent to [facility] for further management in the ICU. He is now status post cystoscopy.Admission blood cultures were drawn along with urine cultures and both have yielded methicillin resistant Staphylococcus aureus. He is currently on vancomycin to cover this organism and also meropenem to cover gut flora.  General surgery and urology are planning joint surgery later this week once the patient is stabilized. He will then have placement of a suprapubic catheter and will undergo a diverting colostomy as well as.   Review of Systems: Review of Systems  Unable to perform ROS: Critical illness    Past Medical History:  Diagnosis Date  . Arthritis   . Bilateral lower extremity edema   . Chronic venous insufficiency   . CKD (chronic kidney disease), stage III ([place])   . History of alcohol abuse    none since 2013 per pt  . History of chronic hepatitis    Genotype 1, FO/F1-- treated w/ harvoni (completed May 2016)  . History of external beam radiation therapy    2007 prostate  . History of intravenous drug use in remission    per pt in remission since 1980's  . HOH (hard of hearing)   . Hypertension   . Normocytic anemia   . Prostate cancer ([place])   . Recurrent prostate cancer [facility]) urologist-  dr Junious Silk   dx 2007  s/p  external beam radioation therapy/ recurrent 12-2015  Gleason 4+4  . Type 2 diabetes, diet controlled ([place])    no meds since 2010 approx    Social History  Substance Use Topics  . Smoking status: Former Smoker    Years: 12.00    Quit date: 05/04/1972  . Smokeless tobacco: Never Used  . Alcohol use No     Comment: hx alcohol abuse-- per pt  none since 2013.    Family History  Problem Relation Age of Onset  .  Colon cancer Neg Hx   . Liver disease Neg Hx    No Known Allergies  OBJECTIVE: Blood pressure (!) 93/54, pulse 99, temperature (!) 97.4 F (36.3 C), temperature source Oral, resp. rate 12, height 5\' 6"  (1.676 m), weight 152 lb 1.9 oz (69 kg), SpO2 100 %.  Physical Exam  Constitutional: He appears distressed.  HENT:  Mouth/Throat: No oropharyngeal exudate.    Eyes: EOM are normal.  Neck: No JVD present. No tracheal deviation present.  Cardiovascular: Normal heart sounds.  Tachycardia present.  Exam reveals no gallop.   No murmur heard. Pulmonary/Chest: Effort normal and breath sounds normal. No respiratory distress. He has no wheezes.  Abdominal: Soft. Bowel sounds are normal. There is tenderness.  Genitourinary:  Genitourinary Comments: Edema scrotum, catheter in place with feculent material  Musculoskeletal:       Legs: Psychiatric: He is agitated.  He was moaning incomprehensible words.    Lab Results Lab Results  Component Value Date   WBC 20.5 (H) 04/20/2017   HGB 8.3 (L) 04/20/2017  HCT 25.1 (L) 04/20/2017   MCV 79.4 04/20/2017   PLT 379 04/20/2017    Lab Results  Component Value Date   CREATININE 2.41 (H) 04/20/2017   BUN 116 (H) 04/20/2017   NA 143 04/20/2017   K 2.8 (L) 04/20/2017   CL 116 (H) 04/20/2017   CO2 14 (L) 04/20/2017    Lab Results  Component Value Date   ALT 28 04/18/2017   AST 50 (H) 04/18/2017   ALKPHOS 223 (H) 04/18/2017   BILITOT 0.7 04/18/2017     Microbiology: Recent Results (from the past 240 hour(s))  Blood Culture (routine x 2)     Status: None (Preliminary result)   Collection Time: 04/18/17  7:09 PM  Result Value Ref Range Status   Specimen Description RIGHT ANTECUBITAL  Final   Special Requests   Final    BOTTLES DRAWN AEROBIC AND ANAEROBIC Blood Culture adequate volume   Culture  Setup Time   Final    AEROBIC BOTTLE GRAM POSITIVE COCCI Gram Stain Report Called to,Read Back By and Verified With: EVERETT,R @1604  BY  MATTHEWS, B 10.13.18 [place] Organism ID to follow CRITICAL RESULT CALLED TO, READ BACK BY AND VERIFIED WITH: PHARMD R RUMBARGER [id number] MLM Performed at [facility], [place] [address] [place], [place] [address]    Culture GRAM POSITIVE COCCI  Final   Report Status PENDING  Incomplete  Blood Culture ID Panel (Reflexed)     Status: Abnormal   Collection Time: 04/18/17  7:09 PM  Result Value Ref Range Status   Enterococcus species NOT DETECTED NOT DETECTED Final   Listeria monocytogenes NOT DETECTED NOT DETECTED Final   Staphylococcus species DETECTED (A) NOT DETECTED Final    Comment: CRITICAL RESULT CALLED TO, READ BACK BY AND VERIFIED WITH: PHARMD R RUMBARGER [phone] MLM    Staphylococcus aureus DETECTED (A) NOT DETECTED Final    Comment: Methicillin (oxacillin)-resistant Staphylococcus aureus (MRSA). MRSA is predictably resistant to beta-lactam antibiotics (except ceftaroline). Preferred therapy is vancomycin unless clinically contraindicated. Patient requires contact precautions if  hospitalized. CRITICAL RESULT CALLED TO, READ BACK BY AND VERIFIED WITH: PHARMD R RUMBARGER [phone] MLM    Methicillin resistance DETECTED (A) NOT DETECTED Final    Comment: CRITICAL RESULT CALLED TO, READ BACK BY AND VERIFIED WITH: PHARMD R RUMBARGER [phone] MLM    Streptococcus species NOT DETECTED NOT DETECTED Final   Streptococcus agalactiae NOT DETECTED NOT DETECTED Final   Streptococcus pneumoniae NOT DETECTED NOT DETECTED Final   Streptococcus pyogenes NOT DETECTED NOT DETECTED Final   Acinetobacter baumannii NOT DETECTED NOT DETECTED Final   Enterobacteriaceae species NOT DETECTED NOT DETECTED Final   Enterobacter cloacae complex NOT DETECTED NOT DETECTED Final   Escherichia coli NOT DETECTED NOT DETECTED Final   Klebsiella oxytoca NOT DETECTED NOT DETECTED Final   Klebsiella pneumoniae NOT DETECTED NOT DETECTED Final   Proteus species NOT DETECTED NOT  DETECTED Final   Serratia marcescens NOT DETECTED NOT DETECTED Final   Haemophilus influenzae NOT DETECTED NOT DETECTED Final   Neisseria meningitidis NOT DETECTED NOT DETECTED Final   Pseudomonas aeruginosa NOT DETECTED NOT DETECTED Final   Candida albicans NOT DETECTED NOT DETECTED Final   Candida glabrata NOT DETECTED NOT DETECTED Final   Candida krusei NOT DETECTED NOT DETECTED Final   Candida parapsilosis NOT DETECTED NOT DETECTED Final   Candida tropicalis NOT DETECTED NOT DETECTED Final    Comment: Performed at [facility], [place]. [address]  [place], [place] [address]  Gastrointestinal Panel by PCR , Stool     Status: None   Collection Time: 04/18/17  9:05 PM  Result Value Ref Range Status   Campylobacter species NOT DETECTED NOT DETECTED Final   Plesimonas shigelloides NOT DETECTED NOT DETECTED Final   Salmonella species NOT DETECTED NOT DETECTED Final   Yersinia enterocolitica NOT DETECTED NOT DETECTED Final   Vibrio species NOT DETECTED NOT DETECTED Final   Vibrio cholerae NOT DETECTED NOT DETECTED Final   Enteroaggregative E coli (EAEC) NOT DETECTED NOT DETECTED Final   Enteropathogenic E coli (EPEC) NOT DETECTED NOT DETECTED Final   Enterotoxigenic E coli (ETEC) NOT DETECTED NOT DETECTED Final   Shiga like toxin producing E coli (STEC) NOT DETECTED NOT DETECTED Final   Shigella/Enteroinvasive E coli (EIEC) NOT DETECTED NOT DETECTED Final   Cryptosporidium NOT DETECTED NOT DETECTED Final   Cyclospora cayetanensis NOT DETECTED NOT DETECTED Final   Entamoeba histolytica NOT DETECTED NOT DETECTED Final   Giardia lamblia NOT DETECTED NOT DETECTED Final   Adenovirus F40/41 NOT DETECTED NOT DETECTED Final   Astrovirus NOT DETECTED NOT DETECTED Final   Norovirus GI/GII NOT DETECTED NOT DETECTED Final   Rotavirus A NOT DETECTED NOT DETECTED Final   Sapovirus (I, II, IV, and V) NOT DETECTED NOT DETECTED Final  C difficile quick scan w PCR reflex     Status: None   Collection  Time: 04/18/17  9:05 PM  Result Value Ref Range Status   C Diff antigen NEGATIVE NEGATIVE Final   C Diff toxin NEGATIVE NEGATIVE Final   C Diff interpretation No C. difficile detected.  Final    Comment: VALID  Blood Culture (routine x 2)     Status: None (Preliminary result)   Collection Time: 04/18/17 10:26 PM  Result Value Ref Range Status   Specimen Description RIGHT ANTECUBITAL  Final   Special Requests   Final    BOTTLES DRAWN AEROBIC ONLY Blood Culture adequate volume   Culture NO GROWTH < 24 HOURS  Final   Report Status PENDING  Incomplete  MRSA PCR Screening     Status: None   Collection Time: 04/19/17  3:06 AM  Result Value Ref Range Status   MRSA by PCR NEGATIVE NEGATIVE Final    Comment:        The GeneXpert MRSA Assay (FDA approved for NASAL specimens only), is one component of a comprehensive MRSA colonization surveillance program. It is not intended to diagnose MRSA infection nor to guide or monitor treatment for MRSA infections.     Alcide Evener, [place] for Infectious [place] [facility] [phone] pager   [phone] cell 04/20/2017, 12:39 PM

## 2017-04-20 NOTE — Progress Notes (Signed)
eLink Physician-Brief Progress Note Patient Name: Colin [name] DOB: 22-Mar-1941 MRN: [id number]   Date of Service  04/20/2017  HPI/Events of Note  Delirium - Currently on Fentanyl 12.5 mcg IV Q 1 hour PRN. Hard to say if this is related to pain vs substance W/D vs delirium.   eICU Interventions  Will increase Fentanyl to 25-50 mcg IV Q 1 hour PRN.      Intervention Category Major Interventions: Delirium, psychosis, severe agitation - evaluation and management  Sommer,Steven Eugene 04/20/2017, 11:47 PM

## 2017-04-20 NOTE — Progress Notes (Signed)
Pt tachycardic and restless. [place] notified, new oral orders for pain management received- pt NPO, order cancelled. Nothing further received.

## 2017-04-20 NOTE — Progress Notes (Signed)
Pt with episode of tachycardia, up to 150's. Attempted to get EKG; once leads all connected pt had reduced to NSR. Pt restless, moaning frequently. A/O x2. eLink camera into room, informed of pt K+ level as well. Orders to be received for K+, nothing further for HR.

## 2017-04-21 ENCOUNTER — Encounter (HOSPITAL_COMMUNITY): Payer: Self-pay | Admitting: General Practice

## 2017-04-21 ENCOUNTER — Inpatient Hospital Stay (HOSPITAL_COMMUNITY): Payer: Medicare Other

## 2017-04-21 DIAGNOSIS — I361 Nonrheumatic tricuspid (valve) insufficiency: Secondary | ICD-10-CM

## 2017-04-21 DIAGNOSIS — R7881 Bacteremia: Secondary | ICD-10-CM

## 2017-04-21 DIAGNOSIS — D72829 Elevated white blood cell count, unspecified: Secondary | ICD-10-CM

## 2017-04-21 DIAGNOSIS — S065X9A Traumatic subdural hemorrhage with loss of consciousness of unspecified duration, initial encounter: Secondary | ICD-10-CM

## 2017-04-21 LAB — BLOOD GAS, ARTERIAL
Acid-base deficit: 19.8 mmol/L — ABNORMAL HIGH (ref 0.0–2.0)
BICARBONATE: 6.1 mmol/L — AB (ref 20.0–28.0)
DRAWN BY: 418751
FIO2: 21
O2 SAT: 97.4 %
PATIENT TEMPERATURE: 98.6
PO2 ART: 126 mmHg — AB (ref 83.0–108.0)
pH, Arterial: 7.285 — ABNORMAL LOW (ref 7.350–7.450)

## 2017-04-21 LAB — CULTURE, BLOOD (ROUTINE X 2): SPECIAL REQUESTS: ADEQUATE

## 2017-04-21 LAB — GLUCOSE, CAPILLARY
GLUCOSE-CAPILLARY: 101 mg/dL — AB (ref 65–99)
GLUCOSE-CAPILLARY: 111 mg/dL — AB (ref 65–99)
GLUCOSE-CAPILLARY: 111 mg/dL — AB (ref 65–99)
GLUCOSE-CAPILLARY: 115 mg/dL — AB (ref 65–99)
GLUCOSE-CAPILLARY: 130 mg/dL — AB (ref 65–99)
Glucose-Capillary: 106 mg/dL — ABNORMAL HIGH (ref 65–99)
Glucose-Capillary: 137 mg/dL — ABNORMAL HIGH (ref 65–99)

## 2017-04-21 LAB — BASIC METABOLIC PANEL
Anion gap: 9 (ref 5–15)
BUN: 104 mg/dL — AB (ref 6–20)
CO2: 14 mmol/L — AB (ref 22–32)
Calcium: 8.1 mg/dL — ABNORMAL LOW (ref 8.9–10.3)
Chloride: 125 mmol/L — ABNORMAL HIGH (ref 101–111)
Creatinine, Ser: 1.85 mg/dL — ABNORMAL HIGH (ref 0.61–1.24)
GFR calc Af Amer: 39 mL/min — ABNORMAL LOW (ref >60)
GFR, EST NON AFRICAN AMERICAN: 34 mL/min — AB (ref >60)
GLUCOSE: 121 mg/dL — AB (ref 65–99)
POTASSIUM: 2.5 mmol/L — AB (ref 3.5–5.1)
Sodium: 148 mmol/L — ABNORMAL HIGH (ref 135–145)

## 2017-04-21 LAB — CBC
HEMATOCRIT: 22.6 % — AB (ref 39.0–52.0)
Hemoglobin: 7.6 g/dL — ABNORMAL LOW (ref 13.0–17.0)
MCH: 26.6 pg (ref 26.0–34.0)
MCHC: 33.6 g/dL (ref 30.0–36.0)
MCV: 79 fL (ref 78.0–100.0)
PLATELETS: 361 10*3/uL (ref 150–400)
RBC: 2.86 MIL/uL — AB (ref 4.22–5.81)
RDW: 23 % — AB (ref 11.5–15.5)
WBC: 18.9 10*3/uL — ABNORMAL HIGH (ref 4.0–10.5)

## 2017-04-21 LAB — PHOSPHORUS: Phosphorus: 1.7 mg/dL — ABNORMAL LOW (ref 2.5–4.6)

## 2017-04-21 LAB — ECHOCARDIOGRAM COMPLETE
Height: 66 in
WEIGHTICAEL: 2430.35 [oz_av]

## 2017-04-21 LAB — PROCALCITONIN: Procalcitonin: 8.25 ng/mL

## 2017-04-21 LAB — MAGNESIUM: Magnesium: 2.1 mg/dL (ref 1.7–2.4)

## 2017-04-21 LAB — AMMONIA: Ammonia: 20 umol/L (ref 9–35)

## 2017-04-21 LAB — HIV ANTIBODY (ROUTINE TESTING W REFLEX): HIV Screen 4th Generation wRfx: NONREACTIVE

## 2017-04-21 MED ORDER — POTASSIUM CHLORIDE 10 MEQ/100ML IV SOLN
10.0000 meq | INTRAVENOUS | Status: AC
  Administered 2017-04-21 (×6): 10 meq via INTRAVENOUS
  Filled 2017-04-21 (×6): qty 100

## 2017-04-21 MED ORDER — CHLORHEXIDINE GLUCONATE 0.12 % MT SOLN
15.0000 mL | Freq: Two times a day (BID) | OROMUCOSAL | Status: DC
  Administered 2017-04-21 – 2017-04-25 (×7): 15 mL via OROMUCOSAL
  Filled 2017-04-21 (×4): qty 15

## 2017-04-21 MED ORDER — POTASSIUM CL IN DEXTROSE 5% 20 MEQ/L IV SOLN
20.0000 meq | INTRAVENOUS | Status: DC
  Administered 2017-04-21 – 2017-04-24 (×4): 20 meq via INTRAVENOUS
  Filled 2017-04-21 (×6): qty 1000

## 2017-04-21 MED ORDER — ORAL CARE MOUTH RINSE
15.0000 mL | Freq: Two times a day (BID) | OROMUCOSAL | Status: DC
  Administered 2017-04-21 – 2017-04-23 (×6): 15 mL via OROMUCOSAL

## 2017-04-21 MED ORDER — SODIUM CHLORIDE 0.9 % IV SOLN
1.0000 g | Freq: Two times a day (BID) | INTRAVENOUS | Status: DC
  Administered 2017-04-21 – 2017-04-23 (×4): 1 g via INTRAVENOUS
  Filled 2017-04-21 (×6): qty 1

## 2017-04-21 NOTE — Progress Notes (Signed)
Subjective: Patient awake, responsive, slightly confused. No c/o pain  Objective: Vital signs in last 24 hours: Temp:  [97.4 F (36.3 C)-98.6 F (37 C)] 98.4 F (36.9 C) (10/15 0806) Pulse Rate:  [53-155] 107 (10/15 0900) Resp:  [9-26] 15 (10/15 0900) BP: (61-135)/(48-89) 116/65 (10/15 0900) SpO2:  [81 %-100 %] 99 % (10/15 0900) Weight:  [68.9 kg (151 lb 14.4 oz)] 68.9 kg (151 lb 14.4 oz) (10/15 0330)  Intake/Output from previous day: 10/14 0701 - 10/15 0700 In: 570 [I.V.:300; IV Piggyback:250] Out: 600 [Urine:140; Stool:460] Intake/Output this shift: Total I/O In: 200 [IV Piggyback:200] Out: -   Physical Exam:  Constitutional: Vital signs reviewed. Eyes: PERRL, No scleral icterus.   Cardiovascular:Sinus tachycardia Pulmonary/Chest: Normal effort Abdominal: Soft. Non-tender, non-distended, bowel sounds are normal, no masses, organomegaly, or guarding present.  Genitourinary:Foley placed. No significant scrotal/perineal erythema/induration Extremities: No cyanosis or edema   Lab Results:  Recent Labs  04/19/17 1929 04/20/17 0531 04/21/17 0241  HGB 7.8* 8.3* 7.6*  HCT 23.2* 25.1* 22.6*   BMET  Recent Labs  04/20/17 0531 04/21/17 0241  NA 143 148*  K 2.8* 2.5*  CL 116* 125*  CO2 14* 14*  GLUCOSE 123* 121*  BUN 116* 104*  CREATININE 2.41* 1.85*  CALCIUM 8.2* 8.1*    Recent Labs  04/18/17 1909  INR 1.26   No results for input(s): LABURIN in the last 72 hours. Results for orders placed or performed during the hospital encounter of 04/18/17  Blood Culture (routine x 2)     Status: Abnormal (Preliminary result)   Collection Time: 04/18/17  7:09 PM  Result Value Ref Range Status   Specimen Description RIGHT ANTECUBITAL  Final   Special Requests   Final    BOTTLES DRAWN AEROBIC AND ANAEROBIC Blood Culture adequate volume   Culture  Setup Time   Final    AEROBIC BOTTLE GRAM POSITIVE COCCI Gram Stain Report Called to,Read Back By and Verified  With: EVERETT,R @1604  BY MATTHEWS, B 10.13.18 [place] Organism ID to follow CRITICAL RESULT CALLED TO, READ BACK BY AND VERIFIED WITH: PHARMD R RUMBARGER [phone] MLM    Culture (A)  Final    STAPHYLOCOCCUS AUREUS SUSCEPTIBILITIES TO FOLLOW Performed at [facility], [place] [address] [place], [place] [address]    Report Status PENDING  Incomplete  Blood Culture ID Panel (Reflexed)     Status: Abnormal   Collection Time: 04/18/17  7:09 PM  Result Value Ref Range Status   Enterococcus species NOT DETECTED NOT DETECTED Final   Listeria monocytogenes NOT DETECTED NOT DETECTED Final   Staphylococcus species DETECTED (A) NOT DETECTED Final    Comment: CRITICAL RESULT CALLED TO, READ BACK BY AND VERIFIED WITH: PHARMD R RUMBARGER [phone] MLM    Staphylococcus aureus DETECTED (A) NOT DETECTED Final    Comment: Methicillin (oxacillin)-resistant Staphylococcus aureus (MRSA). MRSA is predictably resistant to beta-lactam antibiotics (except ceftaroline). Preferred therapy is vancomycin unless clinically contraindicated. Patient requires contact precautions if  hospitalized. CRITICAL RESULT CALLED TO, READ BACK BY AND VERIFIED WITH: PHARMD R RUMBARGER [phone] MLM    Methicillin resistance DETECTED (A) NOT DETECTED Final    Comment: CRITICAL RESULT CALLED TO, READ BACK BY AND VERIFIED WITH: PHARMD R RUMBARGER [phone] MLM    Streptococcus species NOT DETECTED NOT DETECTED Final   Streptococcus agalactiae NOT DETECTED NOT DETECTED Final   Streptococcus pneumoniae NOT DETECTED NOT DETECTED Final   Streptococcus pyogenes NOT DETECTED NOT DETECTED Final  Acinetobacter baumannii NOT DETECTED NOT DETECTED Final   Enterobacteriaceae species NOT DETECTED NOT DETECTED Final   Enterobacter cloacae complex NOT DETECTED NOT DETECTED Final   Escherichia coli NOT DETECTED NOT DETECTED Final   Klebsiella oxytoca NOT DETECTED NOT DETECTED Final   Klebsiella pneumoniae NOT  DETECTED NOT DETECTED Final   Proteus species NOT DETECTED NOT DETECTED Final   Serratia marcescens NOT DETECTED NOT DETECTED Final   Haemophilus influenzae NOT DETECTED NOT DETECTED Final   Neisseria meningitidis NOT DETECTED NOT DETECTED Final   Pseudomonas aeruginosa NOT DETECTED NOT DETECTED Final   Candida albicans NOT DETECTED NOT DETECTED Final   Candida glabrata NOT DETECTED NOT DETECTED Final   Candida krusei NOT DETECTED NOT DETECTED Final   Candida parapsilosis NOT DETECTED NOT DETECTED Final   Candida tropicalis NOT DETECTED NOT DETECTED Final    Comment: Performed at [facility], [place] [address] [place], [place] [address]  Gastrointestinal Panel by PCR , Stool     Status: None   Collection Time: 04/18/17  9:05 PM  Result Value Ref Range Status   Campylobacter species NOT DETECTED NOT DETECTED Final   Plesimonas shigelloides NOT DETECTED NOT DETECTED Final   Salmonella species NOT DETECTED NOT DETECTED Final   Yersinia enterocolitica NOT DETECTED NOT DETECTED Final   Vibrio species NOT DETECTED NOT DETECTED Final   Vibrio cholerae NOT DETECTED NOT DETECTED Final   Enteroaggregative E coli (EAEC) NOT DETECTED NOT DETECTED Final   Enteropathogenic E coli (EPEC) NOT DETECTED NOT DETECTED Final   Enterotoxigenic E coli (ETEC) NOT DETECTED NOT DETECTED Final   Shiga like toxin producing E coli (STEC) NOT DETECTED NOT DETECTED Final   Shigella/Enteroinvasive E coli (EIEC) NOT DETECTED NOT DETECTED Final   Cryptosporidium NOT DETECTED NOT DETECTED Final   Cyclospora cayetanensis NOT DETECTED NOT DETECTED Final   Entamoeba histolytica NOT DETECTED NOT DETECTED Final   Giardia lamblia NOT DETECTED NOT DETECTED Final   Adenovirus F40/41 NOT DETECTED NOT DETECTED Final   Astrovirus NOT DETECTED NOT DETECTED Final   Norovirus GI/GII NOT DETECTED NOT DETECTED Final   Rotavirus A NOT DETECTED NOT DETECTED Final   Sapovirus (I, II, IV, and V) NOT DETECTED NOT DETECTED Final  C  difficile quick scan w PCR reflex     Status: None   Collection Time: 04/18/17  9:05 PM  Result Value Ref Range Status   C Diff antigen NEGATIVE NEGATIVE Final   C Diff toxin NEGATIVE NEGATIVE Final   C Diff interpretation No C. difficile detected.  Final    Comment: VALID  Blood Culture (routine x 2)     Status: None (Preliminary result)   Collection Time: 04/18/17 10:26 PM  Result Value Ref Range Status   Specimen Description RIGHT ANTECUBITAL  Final   Special Requests   Final    BOTTLES DRAWN AEROBIC ONLY Blood Culture adequate volume   Culture NO GROWTH 3 DAYS  Final   Report Status PENDING  Incomplete  MRSA PCR Screening     Status: None   Collection Time: 04/19/17  3:06 AM  Result Value Ref Range Status   MRSA by PCR NEGATIVE NEGATIVE Final    Comment:        The GeneXpert MRSA Assay (FDA approved for NASAL specimens only), is one component of a comprehensive MRSA colonization surveillance program. It is not intended to diagnose MRSA infection nor to guide or monitor treatment for MRSA infections.     Studies/Results: No results  found.  Assessment/Plan:   Rectourethral fistula w/ urosepsis. Pt currently stable, being resuscitated w/ appropriate reponse.  He';ll need eventual fecal diversion w/ sp tube placement. If possible, I'd like to wait until this Thursday ( scheduling ease ). If not possible, IR might be able to place sp tube (bladder can be distended some w/ irrigation for u/s vis.)  Will continue to follow.   LOS: 3 days   Colin [name] M 04/21/2017, 10:58 AM

## 2017-04-21 NOTE — Progress Notes (Signed)
CRITICAL VALUE ALERT  Critical Value:  K 2.5  Date & Time Notied:  04/21/2017 04:00  Provider Notified: Kai Levins, RN for [facility].  Orders Received/Actions taken: K replaced

## 2017-04-21 NOTE — Progress Notes (Signed)
Subjective:  Pt confused   Antibiotics:  Anti-infectives    Start     Dose/Rate Route Frequency Ordered Stop   04/21/17 0800  vancomycin (VANCOCIN) IVPB 1000 mg/200 mL premix  Status:  Discontinued     1,000 mg 200 mL/hr over 60 Minutes Intravenous Every 48 hours 04/19/17 1109 04/20/17 1100   04/20/17 1200  vancomycin (VANCOCIN) IVPB 750 mg/150 ml premix     750 mg 150 mL/hr over 60 Minutes Intravenous Every 24 hours 04/20/17 1100     04/19/17 1000  meropenem (MERREM) 500 mg in sodium chloride 0.9 % 50 mL IVPB     500 mg 100 mL/hr over 30 Minutes Intravenous Every 12 hours 04/19/17 0612     04/19/17 0800  vancomycin (VANCOCIN) 500 mg in sodium chloride 0.9 % 100 mL IVPB  Status:  Discontinued     500 mg 100 mL/hr over 60 Minutes Intravenous Every 48 hours 04/19/17 0612 04/19/17 1109   04/18/17 2215  vancomycin (VANCOCIN) IVPB 1000 mg/200 mL premix     1,000 mg 200 mL/hr over 60 Minutes Intravenous  Once 04/18/17 2209 04/18/17 2317   04/18/17 2215  meropenem (MERREM) 1 g in sodium chloride 0.9 % 100 mL IVPB     1 g 200 mL/hr over 30 Minutes Intravenous  Once 04/18/17 2209 04/18/17 2330      Medications: Scheduled Meds: . chlorhexidine  15 mL Mouth Rinse BID  . mouth rinse  15 mL Mouth Rinse q12n4p   Continuous Infusions: . dextrose 5 % with KCl 20 mEq / L    . meropenem (MERREM) IV Stopped (04/21/17 1148)  . vancomycin 750 mg (04/21/17 1212)   PRN Meds:.fentaNYL (SUBLIMAZE) injection    Objective: Weight change: -3.5 oz (-0.1 kg)  Intake/Output Summary (Last 24 hours) at 04/21/17 1252 Last data filed at 04/21/17 1212  Gross per 24 hour  Intake             1045 ml  Output             1500 ml  Net             -455 ml   Blood pressure 118/80, pulse (!) 130, temperature 98.5 F (36.9 C), temperature source Oral, resp. rate 12, height 5\' 6"  (1.676 m), weight 151 lb 14.4 oz (68.9 kg), SpO2 100 %. Temp:  [97.4 F (36.3 C)-98.6 F (37 C)] 98.5 F (36.9  C) (10/15 1124) Pulse Rate:  [90-155] 130 (10/15 1200) Resp:  [9-26] 12 (10/15 1200) BP: (61-135)/(48-89) 118/80 (10/15 1200) SpO2:  [81 %-100 %] 100 % (10/15 1200) Weight:  [151 lb 14.4 oz (68.9 kg)] 151 lb 14.4 oz (68.9 kg) (10/15 0330)  Physical Exam: General: Alert and awake, confused HEENT: anicteric sclera, eOMI CVS regular rate, normal r,  no murmur rubs or gallops Chest: clear to auscultation bilaterally, no wheezing, rales or rhonchi Abdomen: soft nondistended, catheter with fecal material and Neuro: nonfocal  CBC:  CBC Latest Ref Rng & Units 04/21/2017 04/20/2017 04/19/2017  WBC 4.0 - 10.5 K/uL 18.9(H) 20.5(H) 21.2(H)  Hemoglobin 13.0 - 17.0 g/dL 7.6(L) 8.3(L) 7.8(L)  Hematocrit 39.0 - 52.0 % 22.6(L) 25.1(L) 23.2(L)  Platelets 150 - 400 K/uL 361 379 369      BMET  Recent Labs  04/20/17 0531 04/21/17 0241  NA 143 148*  K 2.8* 2.5*  CL 116* 125*  CO2 14* 14*  GLUCOSE 123* 121*  BUN 116* 104*  CREATININE  2.41* 1.85*  CALCIUM 8.2* 8.1*     Liver Panel   Recent Labs  04/18/17 1909  PROT 7.7  ALBUMIN 2.7*  AST 50*  ALT 28  ALKPHOS 223*  BILITOT 0.7       Sedimentation Rate No results for input(s): ESRSEDRATE in the last 72 hours. C-Reactive Protein No results for input(s): CRP in the last 72 hours.  Micro Results: Recent Results (from the past 720 hour(s))  Blood Culture (routine x 2)     Status: Abnormal   Collection Time: 04/18/17  7:09 PM  Result Value Ref Range Status   Specimen Description RIGHT ANTECUBITAL  Final   Special Requests   Final    BOTTLES DRAWN AEROBIC AND ANAEROBIC Blood Culture adequate volume   Culture  Setup Time   Final    GRAM POSITIVE COCCI AEROBIC BOTTLE ONLY Gram Stain Report Called to,Read Back By and Verified With: EVERETT,R @1604  BY MATTHEWS, B 10.13.18 Performed at [place], READ BACK BY AND VERIFIED WITH: Helyn Numbers RUMBARGER [id number] MLM Performed at [facility], [place] [address] [place], [place] [address]    Culture METHICILLIN RESISTANT STAPHYLOCOCCUS AUREUS (A)  Final   Report Status 04/21/2017 FINAL  Final   Organism ID, Bacteria METHICILLIN RESISTANT STAPHYLOCOCCUS AUREUS  Final      Susceptibility   Methicillin resistant staphylococcus aureus - MIC*    CIPROFLOXACIN >=8 RESISTANT Resistant     ERYTHROMYCIN >=8 RESISTANT Resistant     GENTAMICIN <=0.5 SENSITIVE Sensitive     OXACILLIN >=4 RESISTANT Resistant     TETRACYCLINE <=1 SENSITIVE Sensitive     VANCOMYCIN 1 SENSITIVE Sensitive     TRIMETH/SULFA <=10 SENSITIVE Sensitive     CLINDAMYCIN >=8 RESISTANT Resistant     RIFAMPIN <=0.5 SENSITIVE Sensitive     Inducible Clindamycin NEGATIVE Sensitive     * METHICILLIN RESISTANT STAPHYLOCOCCUS AUREUS  Blood Culture ID Panel (Reflexed)     Status: Abnormal   Collection Time: 04/18/17  7:09 PM  Result Value Ref Range Status   Enterococcus species NOT DETECTED NOT DETECTED Final   Listeria monocytogenes NOT DETECTED NOT DETECTED Final   Staphylococcus species DETECTED (A) NOT DETECTED Final    Comment: CRITICAL RESULT CALLED TO, READ BACK BY AND VERIFIED WITH: PHARMD R RUMBARGER [phone] MLM    Staphylococcus aureus DETECTED (A) NOT DETECTED Final    Comment: Methicillin (oxacillin)-resistant Staphylococcus aureus (MRSA). MRSA is predictably resistant to beta-lactam antibiotics (except ceftaroline). Preferred therapy is vancomycin unless clinically contraindicated. Patient requires contact precautions if  hospitalized. CRITICAL RESULT CALLED TO, READ BACK BY AND VERIFIED WITH: PHARMD R RUMBARGER [phone] MLM    Methicillin resistance DETECTED (A) NOT DETECTED Final    Comment: CRITICAL RESULT CALLED TO, READ BACK BY AND VERIFIED WITH: PHARMD R RUMBARGER [phone] MLM    Streptococcus species NOT DETECTED NOT DETECTED Final   Streptococcus agalactiae NOT DETECTED NOT DETECTED Final   Streptococcus pneumoniae NOT DETECTED  NOT DETECTED Final   Streptococcus pyogenes NOT DETECTED NOT DETECTED Final   Acinetobacter baumannii NOT DETECTED NOT DETECTED Final   Enterobacteriaceae species NOT DETECTED NOT DETECTED Final   Enterobacter cloacae complex NOT DETECTED NOT DETECTED Final   Escherichia coli NOT DETECTED NOT DETECTED Final   Klebsiella oxytoca NOT DETECTED NOT DETECTED Final   Klebsiella pneumoniae NOT DETECTED NOT DETECTED Final   Proteus species NOT DETECTED NOT DETECTED Final   Serratia marcescens NOT DETECTED  NOT DETECTED Final   Haemophilus influenzae NOT DETECTED NOT DETECTED Final   Neisseria meningitidis NOT DETECTED NOT DETECTED Final   Pseudomonas aeruginosa NOT DETECTED NOT DETECTED Final   Candida albicans NOT DETECTED NOT DETECTED Final   Candida glabrata NOT DETECTED NOT DETECTED Final   Candida krusei NOT DETECTED NOT DETECTED Final   Candida parapsilosis NOT DETECTED NOT DETECTED Final   Candida tropicalis NOT DETECTED NOT DETECTED Final    Comment: Performed at [facility], [place] [address] [place], [place] [address]  Gastrointestinal Panel by PCR , Stool     Status: None   Collection Time: 04/18/17  9:05 PM  Result Value Ref Range Status   Campylobacter species NOT DETECTED NOT DETECTED Final   Plesimonas shigelloides NOT DETECTED NOT DETECTED Final   Salmonella species NOT DETECTED NOT DETECTED Final   Yersinia enterocolitica NOT DETECTED NOT DETECTED Final   Vibrio species NOT DETECTED NOT DETECTED Final   Vibrio cholerae NOT DETECTED NOT DETECTED Final   Enteroaggregative E coli (EAEC) NOT DETECTED NOT DETECTED Final   Enteropathogenic E coli (EPEC) NOT DETECTED NOT DETECTED Final   Enterotoxigenic E coli (ETEC) NOT DETECTED NOT DETECTED Final   Shiga like toxin producing E coli (STEC) NOT DETECTED NOT DETECTED Final   Shigella/Enteroinvasive E coli (EIEC) NOT DETECTED NOT DETECTED Final   Cryptosporidium NOT DETECTED NOT DETECTED Final   Cyclospora cayetanensis NOT  DETECTED NOT DETECTED Final   Entamoeba histolytica NOT DETECTED NOT DETECTED Final   Giardia lamblia NOT DETECTED NOT DETECTED Final   Adenovirus F40/41 NOT DETECTED NOT DETECTED Final   Astrovirus NOT DETECTED NOT DETECTED Final   Norovirus GI/GII NOT DETECTED NOT DETECTED Final   Rotavirus A NOT DETECTED NOT DETECTED Final   Sapovirus (I, II, IV, and V) NOT DETECTED NOT DETECTED Final  C difficile quick scan w PCR reflex     Status: None   Collection Time: 04/18/17  9:05 PM  Result Value Ref Range Status   C Diff antigen NEGATIVE NEGATIVE Final   C Diff toxin NEGATIVE NEGATIVE Final   C Diff interpretation No C. difficile detected.  Final    Comment: VALID  Blood Culture (routine x 2)     Status: None (Preliminary result)   Collection Time: 04/18/17 10:26 PM  Result Value Ref Range Status   Specimen Description RIGHT ANTECUBITAL  Final   Special Requests   Final    BOTTLES DRAWN AEROBIC ONLY Blood Culture adequate volume   Culture NO GROWTH 3 DAYS  Final   Report Status PENDING  Incomplete  MRSA PCR Screening     Status: None   Collection Time: 04/19/17  3:06 AM  Result Value Ref Range Status   MRSA by PCR NEGATIVE NEGATIVE Final    Comment:        The GeneXpert MRSA Assay (FDA approved for NASAL specimens only), is one component of a comprehensive MRSA colonization surveillance program. It is not intended to diagnose MRSA infection nor to guide or monitor treatment for MRSA infections.   Culture, blood (Routine X 2) w Reflex to ID Panel     Status: None (Preliminary result)   Collection Time: 04/20/17 11:35 AM  Result Value Ref Range Status   Specimen Description BLOOD BLOOD RIGHT HAND  Final   Special Requests   Final    BOTTLES DRAWN AEROBIC AND ANAEROBIC Blood Culture adequate volume   Culture NO GROWTH 1 DAY  Final   Report Status PENDING  Incomplete  Culture, blood (Routine X 2) w Reflex to ID Panel     Status: None (Preliminary result)   Collection Time:  04/20/17 11:41 AM  Result Value Ref Range Status   Specimen Description BLOOD BLOOD LEFT HAND  Final   Special Requests IN PEDIATRIC BOTTLE Blood Culture adequate volume  Final   Culture NO GROWTH 1 DAY  Final   Report Status PENDING  Incomplete    Studies/Results: No results found.    Assessment/Plan:  INTERVAL HISTORY: 2-D echocardiogram was difficult to complete   Active Problems:   Altered mental status   Acute renal failure (HCC)   Metabolic acidosis   Melena   Diarrhea in adult patient   Heme positive stool   Perineal abscess   Recto-prostatic fistula   MRSA bacteremia   Staphylococcus aureus bacteremia with sepsis (HCC)   Severe sepsis with septic shock (HCC)    Colin [name] is a 76 y.o. 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  Specific aspects of clinical management are critical to optimizing the outcome of patients with SAB.  Therefore, the [facility] Antimicrobial Management Team [facility]) has initiated an intervention aimed at improving the management of SAB at [facility].  To do so, Infectious Diseases physicians are providing an evidence-based consult for the management of all patients with SAB.     Yes No Comments  Perform follow-up blood cultures (even if the patient is afebrile) to ensure clearance of bacteremia [x]  []  No growth at 1 day  Remove vascular catheter and obtain follow-up blood cultures after the removal of the catheter []  []  Do not place PICC or central line for several days probably 4 or 5    Perform echocardiography to evaluate for endocarditis (transthoracic ECHO is 40-50% sensitive, TEE is > 90% sensitive) []  []  Please keep in mind, that neither test can definitively EXCLUDE endocarditis, and that should clinical suspicion remain high for endocarditis the patient should then still be treated with an "endocarditis" duration of therapy = 6 weeks  TTE suboptimal. I would only pursue transesophageal echocardiogram if it would change duration which I don't think it will in this gentleman. I would rather just go ahead and treat him with 6 weeks of IV vancomycin  Consult electrophysiologist to evaluate implanted cardiac device (pacemaker, ICD) []  []    Ensure source control []  []  Have all abscesses been drained effectively? Have deep seeded infections (septic joints or osteomyelitis) had appropriate surgical debridement?  He has fistula (see below)  Investigate for "metastatic" sites of infection []  []  Does the patient have ANY symptom or physical exam finding that would suggest a deeper infection (back or neck pain that may be suggestive of vertebral osteomyelitis or epidural abscess, muscle pain that could be a symptom of pyomyositis)?  Keep in mind that for deep seeded infections MRI imaging with contrast is preferred rather than other often insensitive tests such as plain x-rays, especially early in a patient's presentation.  none obvious  Change antibiotic therapy to vancomycin []  []  Beta-lactam antibiotics are preferred for MSSA due to higher cure  rates.   If on Vancomycin, goal trough should be 15 - 20 mcg/mL  Estimated duration of IV antibiotic therapy:  6 weeks []  []  Consult case management for probably prolonged outpatient IV antibiotic therapy   #2 Fistula: he may be going the operating room on Thursday he may also have an intervention with IR in the interim.  Continue meropenem until he has had his surgeryonce this is done we can simple fight of vancomycin alone.  Dr. Baxter Flattery  take over the service tomorrow.   LOS: 3 days   Alcide Evener 04/21/2017, 12:52 PM

## 2017-04-21 NOTE — Progress Notes (Addendum)
[facility] TEAM 1 - Stepdown/ICU TEAM  Colin [name]  QTM:[id number] DOB: Dec 03, 1940 DOA: 04/18/2017 PCP: Redmond School, MD    Brief Narrative:  76yo male with a Hx of stage 3 CKD, ETOHism, Hepatitis C, HTN, Anemia, Prostate Cancer, and DM2 who presented to [facility] ED on 10/12 with "black" stool for one month which had progressively worsened over 3 days. Hemoglobin 8.8, WBC 41.2. C.Diff negative, CT A/P with apparent fistula between the prostate gland / prostatic urethra and the anterior wall of the rectum. Foley was placed with black stool output noted. Patient was transferred to [facility] for further management.   Significant Events: 10/12 admit through [facility] ED  Subjective: Pt is alert but confused and constantly yelling out.  He denies any pain or distress, telling me he is trying to get his son's attention (nobody else is in the room).  He is not aware he is in the hospital or why he is here.    Assessment & Plan:  Recto-prostatic fistula  will need to go to OR for suprapubic cath and colostomy - Urology and Gen Surgery following   Periurethral abscess Urology to assess prior to surgery   GI Bleed - Acute blood loss anemia  Hgb is trending down - follow trend - transfuse prn to keep Hgb 7.0 or >  MRSA bacteremia  ID directing care of this issue   Severe hypokalemia  likely due to signif GI losses due to above - replace and follow in serial fashion  Metabolic acidosis  Due to renal failure as well as possible GI bicarb losses - follow trend for now as renal fxn improving - if persists may require supplemental bicarb    Uremia  Slowly improving  Acute Delirium  Likely due to uremia / toxic met enceph - follow as BUN improving   Hypernatremia  Reflective of free water deficit - increase resuscitation and follow   Elevated Troponin in setting of demand ischemia  Troponin never signif elevated   HTN BP currently well controlled   AKI on CKD Stage 3 baseline Crt 1.4-1.8 -  crt rapidly approaching his baseline   Recent Labs Lab 04/18/17 1909 04/19/17 0342 04/19/17 1929 04/20/17 0531 04/21/17 0241  CREATININE 4.26* 3.71* 2.61* 2.41* 1.85*   Liver Cirrhosis - Chronic Hep C   ?Cellulitis to Right LE  No evidence on exam today to suggest an active infection - B LE venous duplex w/o DVT on 04/19/17  DM  CBG well controlled  Left Subdural Hematoma  Neurosurgery consulted > 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 rate 19, height '5\' 6"'  (1.676 m), weight 68.9 kg (151 lb 14.4 oz), SpO2 99 %.  Intake/Output Summary (Last 24 hours) at 04/21/17 0905 Last data filed at 04/21/17 0700  Gross per 24 hour  Intake  495 ml  Output              600 ml  Net             -105 ml   Filed Weights   04/19/17 0307 04/20/17 0500 04/21/17 0330  Weight: 67.5 kg (148 lb 13 oz) 69 kg (152 lb 1.9 oz) 68.9 kg (151 lb 14.4 oz)    Examination: General: No acute respiratory distress Lungs: Clear to auscultation bilaterally without wheezes or crackles Cardiovascular: Regular rate and rhythm without murmur gallop or rub normal S1 and S2 Abdomen: Nontender, nondistended, soft, bowel sounds positive, no rebound, no ascites, no appreciable mass Extremities: No significant cyanosis, clubbing, or edema bilateral lower extremities  CBC:  Recent Labs Lab 04/18/17 1909 04/19/17 0342  04/19/17 1441 04/19/17 1929 04/20/17 0531 04/21/17 0241  WBC 41.2* 25.6* 22.8* 21.2* 20.5* 18.9*  NEUTROABS 39.3*  --   --   --   --   --   HGB 8.8* 8.4* 8.0* 7.8* 8.3* 7.6*  HCT 26.3* 25.8* 23.6* 23.2* 25.1* 22.6*  MCV 82.7 81.4 79.5 79.2 79.4 79.0  PLT 404* 425* 349 369 379 546   Basic Metabolic Panel:  Recent Labs Lab 04/18/17 1909 04/19/17 0342 04/19/17 1929 04/20/17 0531 04/21/17 0241  NA 137 141 141 143 148*  K 3.0* 2.8* 2.4* 2.8* 2.5*  CL 108 114* 116* 116* 125*  CO2 <7* 8* 12* 14* 14*  GLUCOSE 131* 120* 136* 123* 121*  BUN 140* 136* 123* 116* 104*  CREATININE 4.26* 3.71* 2.61* 2.41* 1.85*  CALCIUM 8.9 8.5* 8.0* 8.2* 8.1*  MG  --  2.6*  --  2.1 2.1  PHOS  --  4.4  --  2.3* 1.7*   GFR: Estimated Creatinine Clearance: 31.1 mL/min (A) (by C-G formula based on SCr of 1.85 mg/dL (H)).  Liver Function Tests:  Recent Labs Lab 04/18/17 1909  AST 50*  ALT 28  ALKPHOS 223*  BILITOT 0.7  PROT 7.7  ALBUMIN 2.7*    Recent Labs Lab 04/18/17 1909  AMMONIA 92*    Coagulation Profile:  Recent Labs Lab 04/18/17 1909  INR 1.26    Cardiac Enzymes:  Recent Labs Lab 04/18/17 1909 04/19/17 0443 04/19/17 1441  TROPONINI 0.04* 0.05* 0.04*    HbA1C: Hgb A1c MFr Bld  Date/Time Value Ref Range Status  12/29/2009 09:30 AM (H) <5.7 % Final   9.6 (NOTE)                                                                       According to the ADA Clinical Practice Recommendations for 2011, when HbA1c is used as a screening test:   >=6.5%   Diagnostic of Diabetes Mellitus           (if abnormal result  is confirmed)  5.7-6.4%   Increased risk of developing Diabetes Mellitus  References:Diagnosis and Classification of Diabetes Mellitus,Diabetes TKPT,4656,81(EXNTZ 1):S62-S69 and Standards of Medical Care in         Diabetes - 2011,Diabetes GYFV,4944,96  (Suppl 1):S11-S61.    CBG:  Recent Labs Lab 04/20/17 1638 04/20/17 2006 04/21/17 0015 04/21/17 0342  04/21/17 0808  GLUCAP 112* 114* 111* 106* 101*    Recent Results (from the  past 240 hour(s))  Blood Culture (routine x 2)     Status: Abnormal (Preliminary result)   Collection Time: 04/18/17  7:09 PM  Result Value Ref Range Status   Specimen Description RIGHT ANTECUBITAL  Final   Special Requests   Final    BOTTLES DRAWN AEROBIC AND ANAEROBIC Blood Culture adequate volume   Culture  Setup Time   Final    AEROBIC BOTTLE GRAM POSITIVE COCCI Gram Stain Report Called to,Read Back By and Verified With: EVERETT,R '@1604'  BY MATTHEWS, B 10.13.18 [place] Organism ID to follow CRITICAL RESULT CALLED TO, READ BACK BY AND VERIFIED WITH: PHARMD R RUMBARGER [id number] MLM    Culture (A)  Final    STAPHYLOCOCCUS AUREUS SUSCEPTIBILITIES TO FOLLOW Performed at [facility], [place] [address] [place], [place] [address]    Report Status PENDING  Incomplete  Blood Culture ID Panel (Reflexed)     Status: Abnormal   Collection Time: 04/18/17  7:09 PM  Result Value Ref Range Status   Enterococcus species NOT DETECTED NOT DETECTED Final   Listeria monocytogenes NOT DETECTED NOT DETECTED Final   Staphylococcus species DETECTED (A) NOT DETECTED Final    Comment: CRITICAL RESULT CALLED TO, READ BACK BY AND VERIFIED WITH: PHARMD R RUMBARGER [id number] MLM    Staphylococcus aureus DETECTED (A) NOT DETECTED Final    Comment: Methicillin (oxacillin)-resistant Staphylococcus aureus (MRSA). MRSA is predictably resistant to beta-lactam antibiotics (except ceftaroline). Preferred therapy is vancomycin unless clinically contraindicated. Patient requires contact precautions if  hospitalized. CRITICAL RESULT CALLED TO, READ BACK BY AND VERIFIED WITH: PHARMD R RUMBARGER [phone] MLM    Methicillin resistance DETECTED (A) NOT DETECTED Final    Comment: CRITICAL RESULT CALLED TO, READ BACK BY AND VERIFIED WITH: PHARMD R RUMBARGER [id number] MLM    Streptococcus species NOT DETECTED NOT  DETECTED Final   Streptococcus agalactiae NOT DETECTED NOT DETECTED Final   Streptococcus pneumoniae NOT DETECTED NOT DETECTED Final   Streptococcus pyogenes NOT DETECTED NOT DETECTED Final   Acinetobacter baumannii NOT DETECTED NOT DETECTED Final   Enterobacteriaceae species NOT DETECTED NOT DETECTED Final   Enterobacter cloacae complex NOT DETECTED NOT DETECTED Final   Escherichia coli NOT DETECTED NOT DETECTED Final   Klebsiella oxytoca NOT DETECTED NOT DETECTED Final   Klebsiella pneumoniae NOT DETECTED NOT DETECTED Final   Proteus species NOT DETECTED NOT DETECTED Final   Serratia marcescens NOT DETECTED NOT DETECTED Final   Haemophilus influenzae NOT DETECTED NOT DETECTED Final   Neisseria meningitidis NOT DETECTED NOT DETECTED Final   Pseudomonas aeruginosa NOT DETECTED NOT DETECTED Final   Candida albicans NOT DETECTED NOT DETECTED Final   Candida glabrata NOT DETECTED NOT DETECTED Final   Candida krusei NOT DETECTED NOT DETECTED Final   Candida parapsilosis NOT DETECTED NOT DETECTED Final   Candida tropicalis NOT DETECTED NOT DETECTED Final    Comment: Performed at [facility], [place]. [address], [place], [place] [address]  Gastrointestinal Panel by PCR , Stool     Status: None   Collection Time: 04/18/17  9:05 PM  Result Value Ref Range Status   Campylobacter species NOT DETECTED NOT DETECTED Final   Plesimonas shigelloides NOT DETECTED NOT DETECTED Final   Salmonella species NOT DETECTED NOT DETECTED Final   Yersinia enterocolitica NOT DETECTED NOT DETECTED Final   Vibrio species NOT DETECTED NOT DETECTED Final   Vibrio cholerae NOT DETECTED NOT DETECTED Final   Enteroaggregative E coli (EAEC) NOT DETECTED  NOT DETECTED Final   Enteropathogenic E coli (EPEC) NOT DETECTED NOT DETECTED Final   Enterotoxigenic E coli (ETEC) NOT DETECTED NOT DETECTED Final   Shiga like toxin producing E coli (STEC) NOT DETECTED NOT DETECTED Final   Shigella/Enteroinvasive E coli (EIEC) NOT  DETECTED NOT DETECTED Final   Cryptosporidium NOT DETECTED NOT DETECTED Final   Cyclospora cayetanensis NOT DETECTED NOT DETECTED Final   Entamoeba histolytica NOT DETECTED NOT DETECTED Final   Giardia lamblia NOT DETECTED NOT DETECTED Final   Adenovirus F40/41 NOT DETECTED NOT DETECTED Final   Astrovirus NOT DETECTED NOT DETECTED Final   Norovirus GI/GII NOT DETECTED NOT DETECTED Final   Rotavirus A NOT DETECTED NOT DETECTED Final   Sapovirus (I, II, IV, and V) NOT DETECTED NOT DETECTED Final  C difficile quick scan w PCR reflex     Status: None   Collection Time: 04/18/17  9:05 PM  Result Value Ref Range Status   C Diff antigen NEGATIVE NEGATIVE Final   C Diff toxin NEGATIVE NEGATIVE Final   C Diff interpretation No C. difficile detected.  Final    Comment: VALID  Blood Culture (routine x 2)     Status: None (Preliminary result)   Collection Time: 04/18/17 10:26 PM  Result Value Ref Range Status   Specimen Description RIGHT ANTECUBITAL  Final   Special Requests   Final    BOTTLES DRAWN AEROBIC ONLY Blood Culture adequate volume   Culture NO GROWTH 3 DAYS  Final   Report Status PENDING  Incomplete  MRSA PCR Screening     Status: None   Collection Time: 04/19/17  3:06 AM  Result Value Ref Range Status   MRSA by PCR NEGATIVE NEGATIVE Final    Comment:        The GeneXpert MRSA Assay (FDA approved for NASAL specimens only), is one component of a comprehensive MRSA colonization surveillance program. It is not intended to diagnose MRSA infection nor to guide or monitor treatment for MRSA infections.      Scheduled Meds: . chlorhexidine  15 mL Mouth Rinse BID  . insulin aspart  2-6 Units Subcutaneous Q4H  . mouth rinse  15 mL Mouth Rinse q12n4p     LOS: 3 days   Cherene Altes, MD [facility] Office  [phone] Pager - Text Page per Amion as per below:  On-Call/Text Page:      [email or web address]      password [email or web address]  If 7PM-7AM, please contact  night-coverage [email or web address] Password [email or web address] 04/21/2017, 9:05 AM

## 2017-04-21 NOTE — Progress Notes (Addendum)
  Echocardiogram 2D Echocardiogram has been performed.  Patient unable to remain calm and still during length of exam making it technically difficult to assess LV function. Nurse at bedside.   Raedyn Klinck L Androw 04/21/2017, 10:26 AM

## 2017-04-21 NOTE — Progress Notes (Signed)
eLink Physician-Brief Progress Note Patient Name: Colin [name] DOB: 03-Jul-1941 MRN: [id number]   Date of Service  04/21/2017  HPI/Events of Note  K+ = 2.5 and Creatinine = 1.85.  eICU Interventions  Will replace K+.      Intervention Category Major Interventions: Electrolyte abnormality - evaluation and management  Taleah Bellantoni Eugene 04/21/2017, 4:30 AM

## 2017-04-21 NOTE — Progress Notes (Addendum)
Subjective No acute events. Remains confused.   Objective: Vital signs in last 24 hours: Temp:  [97.4 F (36.3 C)-98.6 F (37 C)] 98.4 F (36.9 C) (10/15 0806) Pulse Rate:  [53-132] 132 (10/15 0730) Resp:  [9-26] 19 (10/15 0730) BP: (61-135)/(48-89) 114/78 (10/15 0730) SpO2:  [81 %-100 %] 99 % (10/15 0730) Weight:  [68.9 kg (151 lb 14.4 oz)] 68.9 kg (151 lb 14.4 oz) (10/15 0330) Last BM Date: 04/20/17  Intake/Output from previous day: 10/14 0701 - 10/15 0700 In: 570 [I.V.:300; IV Piggyback:250] Out: 600 [Urine:140; Stool:460] Intake/Output this shift: No intake/output data recorded.  Gen: NAD, comfortable CV: RRR Pulm: Normal work of breathing Abd: Soft, NT/ND Ext: SCDs in place  Lab Results: CBC   Recent Labs  04/20/17 0531 04/21/17 0241  WBC 20.5* 18.9*  HGB 8.3* 7.6*  HCT 25.1* 22.6*  PLT 379 361   BMET  Recent Labs  04/20/17 0531 04/21/17 0241  NA 143 148*  K 2.8* 2.5*  CL 116* 125*  CO2 14* 14*  GLUCOSE 123* 121*  BUN 116* 104*  CREATININE 2.41* 1.85*  CALCIUM 8.2* 8.1*   PT/INR  Recent Labs  04/18/17 1909  LABPROT 15.7*  INR 1.26   ABG  Recent Labs  04/18/17 2230 04/19/17 0345  PHART 7.266* 7.285*  HCO3 CRITICAL RESULT CALLED TO, READ BACK BY AND VERIFIED WITH: 6.1*    Studies/Results:  Anti-infectives: Anti-infectives    Start     Dose/Rate Route Frequency Ordered Stop   04/21/17 0800  vancomycin (VANCOCIN) IVPB 1000 mg/200 mL premix  Status:  Discontinued     1,000 mg 200 mL/hr over 60 Minutes Intravenous Every 48 hours 04/19/17 1109 04/20/17 1100   04/20/17 1200  vancomycin (VANCOCIN) IVPB 750 mg/150 ml premix     750 mg 150 mL/hr over 60 Minutes Intravenous Every 24 hours 04/20/17 1100     04/19/17 1000  meropenem (MERREM) 500 mg in sodium chloride 0.9 % 50 mL IVPB     500 mg 100 mL/hr over 30 Minutes Intravenous Every 12 hours 04/19/17 0612     04/19/17 0800  vancomycin (VANCOCIN) 500 mg in sodium chloride 0.9 % 100 mL  IVPB  Status:  Discontinued     500 mg 100 mL/hr over 60 Minutes Intravenous Every 48 hours 04/19/17 0612 04/19/17 1109   04/18/17 2215  vancomycin (VANCOCIN) IVPB 1000 mg/200 mL premix     1,000 mg 200 mL/hr over 60 Minutes Intravenous  Once 04/18/17 2209 04/18/17 2317   04/18/17 2215  meropenem (MERREM) 1 g in sodium chloride 0.9 % 100 mL IVPB     1 g 200 mL/hr over 30 Minutes Intravenous  Once 04/18/17 2209 04/18/17 2330       Assessment/Plan: Rectourethral fistula, probable urosepsis -IV abx as per ID -Continue resuscitation/correction of underlying electrolyte abnormalities -Urology following, planning repeat CT prior to OR for diversion to evaluate for prostatic abscess - all once medically optimized. We will be available for fecal diversion - likely end sigmoid colostomy. Ideally, will get bowel prep day prior to surgery - may need NG tube to accomplish this   LOS: 3 days   Ileana Roup, [facility]

## 2017-04-22 DIAGNOSIS — L02215 Cutaneous abscess of perineum: Secondary | ICD-10-CM

## 2017-04-22 LAB — COMPREHENSIVE METABOLIC PANEL
ALT: 22 U/L (ref 17–63)
AST: 33 U/L (ref 15–41)
Albumin: 1.9 g/dL — ABNORMAL LOW (ref 3.5–5.0)
Alkaline Phosphatase: 214 U/L — ABNORMAL HIGH (ref 38–126)
Anion gap: 10 (ref 5–15)
BILIRUBIN TOTAL: 0.7 mg/dL (ref 0.3–1.2)
BUN: 76 mg/dL — AB (ref 6–20)
CO2: 14 mmol/L — ABNORMAL LOW (ref 22–32)
CREATININE: 1.52 mg/dL — AB (ref 0.61–1.24)
Calcium: 7.8 mg/dL — ABNORMAL LOW (ref 8.9–10.3)
Chloride: 120 mmol/L — ABNORMAL HIGH (ref 101–111)
GFR calc Af Amer: 50 mL/min — ABNORMAL LOW (ref >60)
GFR, EST NON AFRICAN AMERICAN: 43 mL/min — AB (ref >60)
Glucose, Bld: 111 mg/dL — ABNORMAL HIGH (ref 65–99)
Potassium: 2.9 mmol/L — ABNORMAL LOW (ref 3.5–5.1)
Sodium: 144 mmol/L (ref 135–145)
TOTAL PROTEIN: 5.5 g/dL — AB (ref 6.5–8.1)

## 2017-04-22 LAB — CBC
HEMATOCRIT: 24.1 % — AB (ref 39.0–52.0)
Hemoglobin: 7.9 g/dL — ABNORMAL LOW (ref 13.0–17.0)
MCH: 26.7 pg (ref 26.0–34.0)
MCHC: 32.8 g/dL (ref 30.0–36.0)
MCV: 81.4 fL (ref 78.0–100.0)
PLATELETS: 332 10*3/uL (ref 150–400)
RBC: 2.96 MIL/uL — ABNORMAL LOW (ref 4.22–5.81)
RDW: 23.3 % — AB (ref 11.5–15.5)
WBC: 15.4 10*3/uL — ABNORMAL HIGH (ref 4.0–10.5)

## 2017-04-22 LAB — HEPATITIS A ANTIBODY, TOTAL: Hep A Total Ab: NEGATIVE

## 2017-04-22 LAB — HEPATITIS B SURFACE ANTIGEN: Hepatitis B Surface Ag: NEGATIVE

## 2017-04-22 MED ORDER — VANCOMYCIN HCL IN DEXTROSE 1-5 GM/200ML-% IV SOLN
1000.0000 mg | INTRAVENOUS | Status: DC
  Administered 2017-04-22: 1000 mg via INTRAVENOUS
  Filled 2017-04-22 (×3): qty 200

## 2017-04-22 MED ORDER — POTASSIUM CHLORIDE 10 MEQ/100ML IV SOLN
10.0000 meq | INTRAVENOUS | Status: AC
  Administered 2017-04-22 (×6): 10 meq via INTRAVENOUS
  Filled 2017-04-22 (×7): qty 100

## 2017-04-22 MED ORDER — POTASSIUM CHLORIDE 10 MEQ/100ML IV SOLN
10.0000 meq | INTRAVENOUS | Status: AC
  Administered 2017-04-22 (×4): 10 meq via INTRAVENOUS
  Filled 2017-04-22 (×8): qty 100

## 2017-04-22 NOTE — Progress Notes (Signed)
Pharmacy Antibiotic Note  Colin [name] is a 76 y.o. male admitted on 04/18/2017 with urethral/rectal fistula. Pharmacy has been consulted for vancomycin and meropenem dosing. Pt is afebrile, WBC is down to 15.4, and SCr is also trending down to 1.52.   Per urology, plans to undergo surgery for a suprapubic cath and colostomy on 10/18.  Plan: Increase vancomycin to 1000mg  IV Q24H with improved renal function Continue meropenem 1g IV Q12H F/u renal fxn, C&S, clinical status and trough at SS  Height: 5\' 6"  (167.6 cm) Weight: 151 lb 14.4 oz (68.9 kg) IBW/kg (Calculated) : 63.8  Temp (24hrs), Avg:98.1 F (36.7 C), Min:97.3 F (36.3 C), Max:98.5 F (36.9 C)   Recent Labs Lab 04/18/17 1909 04/18/17 2116 04/19/17 0342 04/19/17 1441 04/19/17 1929 04/20/17 0531 04/21/17 0241 04/22/17 0255  WBC 41.2*  --  25.6* 22.8* 21.2* 20.5* 18.9* 15.4*  CREATININE 4.26*  --  3.71*  --  2.61* 2.41* 1.85* 1.52*  LATICACIDVEN 1.2 0.9  --   --   --   --   --   --     Estimated Creatinine Clearance: 37.9 mL/min (A) (by C-G formula based on SCr of 1.52 mg/dL (H)).    No Known Allergies  Antimicrobials this admission: Vanc 10/12>> Meropenem 10/12>>  Microbiology results: 10/13 MRSA - NEG 10/12 Blood - MRSA 1/2 10/12 CDiff - NEG  Thank you for allowing pharmacy to be a part of this patient's care.  Leroy Libman, PharmD Pharmacy Resident Pager: [phone]

## 2017-04-22 NOTE — Progress Notes (Signed)
CRITICAL VALUE ALERT  Critical Value:  K 2.9  Date & Time Notied:  10.16.18 0500  Provider Notified: Warren Lacy  Orders Received/Actions taken: Awaiting response

## 2017-04-22 NOTE — Progress Notes (Addendum)
Subjective No acute events. Answering questions this morning, much more lucid. Knows year and understands what's going on. Denies complaints this morning  Objective: Vital signs in last 24 hours: Temp:  [97.3 F (36.3 C)-98.5 F (36.9 C)] 97.7 F (36.5 C) (10/16 0756) Pulse Rate:  [74-130] 89 (10/16 0800) Resp:  [12-21] 18 (10/16 0800) BP: (98-124)/(54-80) 112/79 (10/16 0800) SpO2:  [94 %-100 %] 97 % (10/16 0800) Weight:  [68.9 kg (151 lb 14.4 oz)] 68.9 kg (151 lb 14.4 oz) (10/16 0405) Last BM Date: 04/21/17  Intake/Output from previous day: 10/15 0701 - 10/16 0700 In: 1462.5 [I.V.:912.5; IV Piggyback:550] Out: 2750 [Stool:2750] Intake/Output this shift: Total I/O In: -  Out: 200 [Stool:200]  Gen: NAD, comfortable CV: RRR Pulm: Normal work of breathing Abd: Soft, NT/ND Ext: SCDs in place  Lab Results: CBC   Recent Labs  04/21/17 0241 04/22/17 0255  WBC 18.9* 15.4*  HGB 7.6* 7.9*  HCT 22.6* 24.1*  PLT 361 332   BMET  Recent Labs  04/21/17 0241 04/22/17 0255  NA 148* 144  K 2.5* 2.9*  CL 125* 120*  CO2 14* 14*  GLUCOSE 121* 111*  BUN 104* 76*  CREATININE 1.85* 1.52*  CALCIUM 8.1* 7.8*   PT/INR No results for input(s): LABPROT, INR in the last 72 hours. ABG No results for input(s): PHART, HCO3 in the last 72 hours.  Invalid input(s): PCO2, PO2  Studies/Results:  Anti-infectives: Anti-infectives    Start     Dose/Rate Route Frequency Ordered Stop   04/22/17 1200  vancomycin (VANCOCIN) IVPB 1000 mg/200 mL premix     1,000 mg 200 mL/hr over 60 Minutes Intravenous Every 24 hours 04/22/17 0800     04/21/17 2200  meropenem (MERREM) 1 g in sodium chloride 0.9 % 100 mL IVPB     1 g 200 mL/hr over 30 Minutes Intravenous Every 12 hours 04/21/17 1535     04/21/17 0800  vancomycin (VANCOCIN) IVPB 1000 mg/200 mL premix  Status:  Discontinued     1,000 mg 200 mL/hr over 60 Minutes Intravenous Every 48 hours 04/19/17 1109 04/20/17 1100   04/20/17 1200   vancomycin (VANCOCIN) IVPB 750 mg/150 ml premix  Status:  Discontinued     750 mg 150 mL/hr over 60 Minutes Intravenous Every 24 hours 04/20/17 1100 04/22/17 0800   04/19/17 1000  meropenem (MERREM) 500 mg in sodium chloride 0.9 % 50 mL IVPB  Status:  Discontinued     500 mg 100 mL/hr over 30 Minutes Intravenous Every 12 hours 04/19/17 0612 04/21/17 1535   04/19/17 0800  vancomycin (VANCOCIN) 500 mg in sodium chloride 0.9 % 100 mL IVPB  Status:  Discontinued     500 mg 100 mL/hr over 60 Minutes Intravenous Every 48 hours 04/19/17 0612 04/19/17 1109   04/18/17 2215  vancomycin (VANCOCIN) IVPB 1000 mg/200 mL premix     1,000 mg 200 mL/hr over 60 Minutes Intravenous  Once 04/18/17 2209 04/18/17 2317   04/18/17 2215  meropenem (MERREM) 1 g in sodium chloride 0.9 % 100 mL IVPB     1 g 200 mL/hr over 30 Minutes Intravenous  Once 04/18/17 2209 04/18/17 2330       Assessment/Plan: Rectourethral fistula, probable urosepsis; colonoscopy 2015 reviewed, polyps, all removed and benign -IV abx as per ID -Continue resuscitation/correction of underlying electrolyte abnormalities - much improved today -Urology following, planning repeat CT prior to OR for diversion to evaluate for prostatic abscess. Uro available for OR Thursday so we will  plan combo case with them - laparoscopic vs open sigmoid colostomy by our service, SP tube by urology. -Clear liquids starting tomorrow -Miralax/gatorade bowel prep starting tomorrow -Wound/ostomy nurse for stoma marking today - LLQ   LOS: 4 days   Sharon Mt. Dema Severin, M.D. General and Colorectal Surgery [facility]

## 2017-04-22 NOTE — Progress Notes (Addendum)
[facility] TEAM 1 - Stepdown/ICU TEAM  Colin [name]  IRS:[id number] DOB: 02-17-1941 DOA: 04/18/2017 PCP: Redmond School, MD    Brief Narrative:  76yo male with a Hx of stage 3 CKD, ETOHism, Hepatitis C, HTN, Anemia, Prostate Cancer, and DM2 who presented to [facility] ED on 10/12 with "black" stool for one month which had progressively worsened over 3 days. Hemoglobin 8.8, WBC 41.2. C.Diff negative, CT A/P with apparent fistula between the prostate gland / prostatic urethra and the anterior wall of the rectum. Foley was placed with black stool output noted. Patient was transferred to [facility] for further management.   Significant Events: 10/12 admit through [facility] ED  Subjective: Much more alert today.  No longer yelling out.  Denies specific complaints.  Tells me he is very hungry.    Assessment & Plan:  Recto-prostatic fistula  will need to go to OR for suprapubic cath and colostomy - Urology and Gen Surgery following - planning of surgery later this week   Periurethral abscess Urology to assess prior to surgery   GI Bleed - Acute blood loss anemia  Hgb appears to have reached a nadir at 7.6 - Hgb presently climbing - follow trend - transfuse prn to keep Hgb 7.0 or >  MRSA bacteremia  ID directing care of this issue   Severe hypokalemia  likely due to signif GI losses due to above - cont to replace and follow in serial fashion  Metabolic acidosis  Due to renal failure as well as possible GI bicarb losses - follow trend for now as renal fxn improving - if persists may require supplemental bicarb    Uremia  Slowly improving - cont to hydrate   Acute Delirium  Likely due to uremia / toxic met enceph - mental status is improving as BUN improving   Hypernatremia  Reflective of free water deficit - corrected w/ free water resuscitation    Elevated Troponin in setting of demand ischemia  Troponin never signif elevated   HTN BP currently well controlled   AKI on CKD Stage  3 baseline Crt 1.4-1.8 - crt now at his baseline   Recent Labs Lab 04/19/17 0342 04/19/17 1929 04/20/17 0531 04/21/17 0241 04/22/17 0255  CREATININE 3.71* 2.61* 2.41* 1.85* 1.52*   Liver Cirrhosis - Chronic Hep C   ?Cellulitis to Right LE  No evidence on exam today to suggest an active infection - B LE venous duplex w/o DVT on 04/19/17  DM  CBG well controlled  Left Subdural Hematoma  Neurosurgery consulted > 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   Hx of ETOH abuse (stopped 2013) and 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)  Nutrition  Gen Surgery reports pt can be given clears beginning 10/17 - cont dextrose in IV - not yet cleared for central catheter to allow TNA  DVT prophylaxis: SCDs Code Status: FULL CODE Family Communication: no family present at time of exam  Disposition Plan: stable for transfer to tele bed - cont to correct lytes - follow renal fxn and Hgb - awaiting surgical intervention   Consultants:  PCCM Gen Surgery Urology  Nephrology  ID  Antimicrobials:  Vancomycin 10/12 > Meropenem 10/12 >   Objective: Blood pressure 112/79, pulse 89, temperature 97.7 F (36.5 C), temperature source Oral, resp. rate 18, height '5\' 6"'  (1.676 m), weight 68.9 kg (151 lb 14.4 oz), SpO2 97 %.  Intake/Output Summary (Last 24 hours) at 04/22/17 0925 Last data filed at 04/22/17 0800  Gross per 24 hour  Intake           1262.5 ml  Output             2950 ml  Net          -1687.5 ml   Filed Weights   04/20/17 0500 04/21/17 0330 04/22/17 0405  Weight: 69 kg (152 lb 1.9 oz) 68.9 kg (151 lb 14.4 oz) 68.9 kg (151 lb 14.4 oz)    Examination: General: No acute respiratory distress - more calm  Lungs: CTA B w/o wheezing  Cardiovascular: RRR - no M or rub  Abdomen: Nontender, nondistended, soft, bowel sounds positive, no rebound, no ascites, no appreciable mass Extremities: No edema B LE   CBC:  Recent Labs Lab 04/18/17 1909  04/19/17 1441 04/19/17 1929 04/20/17 0531 04/21/17 0241  04/22/17 0255  WBC 41.2*  < > 22.8* 21.2* 20.5* 18.9* 15.4*  NEUTROABS 39.3*  --   --   --   --   --   --   HGB 8.8*  < > 8.0* 7.8* 8.3* 7.6* 7.9*  HCT 26.3*  < > 23.6* 23.2* 25.1* 22.6* 24.1*  MCV 82.7  < > 79.5 79.2 79.4 79.0 81.4  PLT 404*  < > 349 369 379 361 332  < > = values in this interval not displayed. Basic Metabolic Panel:  Recent Labs Lab 04/19/17 0342 04/19/17 1929 04/20/17 0531 04/21/17 0241 04/22/17 0255  NA 141 141 143 148* 144  K 2.8* 2.4* 2.8* 2.5* 2.9*  CL 114* 116* 116* 125* 120*  CO2 8* 12* 14* 14* 14*  GLUCOSE 120* 136* 123* 121* 111*  BUN 136* 123* 116* 104* 76*  CREATININE 3.71* 2.61* 2.41* 1.85* 1.52*  CALCIUM 8.5* 8.0* 8.2* 8.1* 7.8*  MG 2.6*  --  2.1 2.1  --   PHOS 4.4  --  2.3* 1.7*  --    GFR: Estimated Creatinine Clearance: 37.9 mL/min (A) (by C-G formula based on SCr of 1.52 mg/dL (H)).  Liver Function Tests:  Recent Labs Lab 04/18/17 1909 04/22/17 0255  AST 50* 33  ALT 28 22  ALKPHOS 223* 214*  BILITOT 0.7 0.7  PROT 7.7 5.5*  ALBUMIN 2.7* 1.9*    Recent Labs Lab 04/18/17 1909 04/21/17 1151  AMMONIA 92* 20    Coagulation Profile:  Recent Labs Lab 04/18/17 1909  INR 1.26    Cardiac Enzymes:  Recent Labs Lab 04/18/17 1909 04/19/17 0443 04/19/17 1441  TROPONINI 0.04* 0.05* 0.04*    HbA1C: Hgb A1c MFr Bld  Date/Time Value Ref Range Status  12/29/2009 09:30 AM (H) <5.7 % Final   9.6 (NOTE)                                                                       According to the ADA Clinical Practice Recommendations for 2011, when HbA1c is used as a screening test:   >=6.5%   Diagnostic of Diabetes Mellitus           (if abnormal result  is confirmed)  5.7-6.4%   Increased risk of developing Diabetes Mellitus  References:Diagnosis and Classification of Diabetes Mellitus,Diabetes UXLK,4401,02(VOZDG 1):S62-S69 and Standards of Medical Care in         Diabetes - 2011,Diabetes UYQI,3474,25  (  Suppl 1):S11-S61.     CBG:  Recent Labs Lab 04/21/17 0808 04/21/17 1126 04/21/17 1548 04/21/17 2006 04/21/17 2326  GLUCAP 101* 111* 137* 130* 115*    Recent Results (from the past 240 hour(s))  Blood Culture (routine x 2)     Status: Abnormal   Collection Time: 04/18/17  7:09 PM  Result Value Ref Range Status   Specimen Description RIGHT ANTECUBITAL  Final   Special Requests   Final    BOTTLES DRAWN AEROBIC AND ANAEROBIC Blood Culture adequate volume   Culture  Setup Time   Final    GRAM POSITIVE COCCI AEROBIC BOTTLE ONLY Gram Stain Report Called to,Read Back By and Verified With: EVERETT,R '@1604'  BY MATTHEWS, B 10.13.18 Performed at [facility], READ BACK BY AND VERIFIED WITH: Helyn Numbers RUMBARGER [id number] MLM Performed at [facility], [place] [address] [place], [place] [address]    Culture METHICILLIN RESISTANT STAPHYLOCOCCUS AUREUS (A)  Final   Report Status 04/21/2017 FINAL  Final   Organism ID, Bacteria METHICILLIN RESISTANT STAPHYLOCOCCUS AUREUS  Final      Susceptibility   Methicillin resistant staphylococcus aureus - MIC*    CIPROFLOXACIN >=8 RESISTANT Resistant     ERYTHROMYCIN >=8 RESISTANT Resistant     GENTAMICIN <=0.5 SENSITIVE Sensitive     OXACILLIN >=4 RESISTANT Resistant     TETRACYCLINE <=1 SENSITIVE Sensitive     VANCOMYCIN 1 SENSITIVE Sensitive     TRIMETH/SULFA <=10 SENSITIVE Sensitive     CLINDAMYCIN >=8 RESISTANT Resistant     RIFAMPIN <=0.5 SENSITIVE Sensitive     Inducible Clindamycin NEGATIVE Sensitive     * METHICILLIN RESISTANT STAPHYLOCOCCUS AUREUS  Blood Culture ID Panel (Reflexed)     Status: Abnormal   Collection Time: 04/18/17  7:09 PM  Result Value Ref Range Status   Enterococcus species NOT DETECTED NOT DETECTED Final   Listeria monocytogenes NOT DETECTED NOT DETECTED Final   Staphylococcus species DETECTED (A) NOT DETECTED Final    Comment: CRITICAL RESULT CALLED TO, READ BACK BY AND VERIFIED WITH: PHARMD R  RUMBARGER [phone] MLM    Staphylococcus aureus DETECTED (A) NOT DETECTED Final    Comment: Methicillin (oxacillin)-resistant Staphylococcus aureus (MRSA). MRSA is predictably resistant to beta-lactam antibiotics (except ceftaroline). Preferred therapy is vancomycin unless clinically contraindicated. Patient requires contact precautions if  hospitalized. CRITICAL RESULT CALLED TO, READ BACK BY AND VERIFIED WITH: PHARMD R RUMBARGER [phone] MLM    Methicillin resistance DETECTED (A) NOT DETECTED Final    Comment: CRITICAL RESULT CALLED TO, READ BACK BY AND VERIFIED WITH: PHARMD R RUMBARGER [phone] MLM    Streptococcus species NOT DETECTED NOT DETECTED Final   Streptococcus agalactiae NOT DETECTED NOT DETECTED Final   Streptococcus pneumoniae NOT DETECTED NOT DETECTED Final   Streptococcus pyogenes NOT DETECTED NOT DETECTED Final   Acinetobacter baumannii NOT DETECTED NOT DETECTED Final   Enterobacteriaceae species NOT DETECTED NOT DETECTED Final   Enterobacter cloacae complex NOT DETECTED NOT DETECTED Final   Escherichia coli NOT DETECTED NOT DETECTED Final   Klebsiella oxytoca NOT DETECTED NOT DETECTED Final   Klebsiella pneumoniae NOT DETECTED NOT DETECTED Final   Proteus species NOT DETECTED NOT DETECTED Final   Serratia marcescens NOT DETECTED NOT DETECTED Final   Haemophilus influenzae NOT DETECTED NOT DETECTED Final   Neisseria meningitidis NOT DETECTED NOT DETECTED Final   Pseudomonas aeruginosa NOT DETECTED NOT DETECTED Final   Candida albicans NOT DETECTED NOT DETECTED Final  Candida glabrata NOT DETECTED NOT DETECTED Final   Candida krusei NOT DETECTED NOT DETECTED Final   Candida parapsilosis NOT DETECTED NOT DETECTED Final   Candida tropicalis NOT DETECTED NOT DETECTED Final    Comment: Performed at [facility], [place] [address] [place], [place] [address]  Gastrointestinal Panel by PCR , Stool     Status: None   Collection Time: 04/18/17  9:05 PM  Result  Value Ref Range Status   Campylobacter species NOT DETECTED NOT DETECTED Final   Plesimonas shigelloides NOT DETECTED NOT DETECTED Final   Salmonella species NOT DETECTED NOT DETECTED Final   Yersinia enterocolitica NOT DETECTED NOT DETECTED Final   Vibrio species NOT DETECTED NOT DETECTED Final   Vibrio cholerae NOT DETECTED NOT DETECTED Final   Enteroaggregative E coli (EAEC) NOT DETECTED NOT DETECTED Final   Enteropathogenic E coli (EPEC) NOT DETECTED NOT DETECTED Final   Enterotoxigenic E coli (ETEC) NOT DETECTED NOT DETECTED Final   Shiga like toxin producing E coli (STEC) NOT DETECTED NOT DETECTED Final   Shigella/Enteroinvasive E coli (EIEC) NOT DETECTED NOT DETECTED Final   Cryptosporidium NOT DETECTED NOT DETECTED Final   Cyclospora cayetanensis NOT DETECTED NOT DETECTED Final   Entamoeba histolytica NOT DETECTED NOT DETECTED Final   Giardia lamblia NOT DETECTED NOT DETECTED Final   Adenovirus F40/41 NOT DETECTED NOT DETECTED Final   Astrovirus NOT DETECTED NOT DETECTED Final   Norovirus GI/GII NOT DETECTED NOT DETECTED Final   Rotavirus A NOT DETECTED NOT DETECTED Final   Sapovirus (I, II, IV, and V) NOT DETECTED NOT DETECTED Final  C difficile quick scan w PCR reflex     Status: None   Collection Time: 04/18/17  9:05 PM  Result Value Ref Range Status   C Diff antigen NEGATIVE NEGATIVE Final   C Diff toxin NEGATIVE NEGATIVE Final   C Diff interpretation No C. difficile detected.  Final    Comment: VALID  Blood Culture (routine x 2)     Status: None (Preliminary result)   Collection Time: 04/18/17 10:26 PM  Result Value Ref Range Status   Specimen Description RIGHT ANTECUBITAL  Final   Special Requests   Final    BOTTLES DRAWN AEROBIC ONLY Blood Culture adequate volume   Culture NO GROWTH 4 DAYS  Final   Report Status PENDING  Incomplete  MRSA PCR Screening     Status: None   Collection Time: 04/19/17  3:06 AM  Result Value Ref Range Status   MRSA by PCR NEGATIVE  NEGATIVE Final    Comment:        The GeneXpert MRSA Assay (FDA approved for NASAL specimens only), is one component of a comprehensive MRSA colonization surveillance program. It is not intended to diagnose MRSA infection nor to guide or monitor treatment for MRSA infections.   Culture, blood (Routine X 2) w Reflex to ID Panel     Status: None (Preliminary result)   Collection Time: 04/20/17 11:35 AM  Result Value Ref Range Status   Specimen Description BLOOD BLOOD RIGHT HAND  Final   Special Requests   Final    BOTTLES DRAWN AEROBIC AND ANAEROBIC Blood Culture adequate volume   Culture NO GROWTH 1 DAY  Final   Report Status PENDING  Incomplete  Culture, blood (Routine X 2) w Reflex to ID Panel     Status: None (Preliminary result)   Collection Time: 04/20/17 11:41 AM  Result Value Ref Range Status   Specimen Description BLOOD BLOOD  LEFT HAND  Final   Special Requests IN PEDIATRIC BOTTLE Blood Culture adequate volume  Final   Culture NO GROWTH 1 DAY  Final   Report Status PENDING  Incomplete     Scheduled Meds: . chlorhexidine  15 mL Mouth Rinse BID  . mouth rinse  15 mL Mouth Rinse q12n4p     LOS: 4 days   Cherene Altes, MD [facility] Office  [phone] Pager - Text Page per Amion as per below:  On-Call/Text Page:      [email or web address]      password [email or web address]  If 7PM-7AM, please contact night-coverage [email or web address] Password [email or web address] 04/22/2017, 9:25 AM

## 2017-04-22 NOTE — Progress Notes (Signed)
Continue on broad spectrum abtx until surgery then we will narrow at thereafter.

## 2017-04-22 NOTE — Progress Notes (Signed)
eLink Physician-Brief Progress Note Patient Name: DOROTEO NICKOLSON DOB: 05-05-1941 MRN: [id number]   Date of Service  04/22/2017  HPI/Events of Note  K+ = 2.9 and Creatinine = 1.52.   eICU Interventions  Will replace K+.     Intervention Category Major Interventions: Electrolyte abnormality - evaluation and management  Willadeen Colantuono [place] 04/22/2017, 6:23 AM

## 2017-04-22 NOTE — Consult Note (Signed)
[facility] Nurse requested for preoperative stoma site marking  Discussed surgical procedure and stoma creation with patient and family.  Explained role of the [facility] nurse team.  Provided the patient with educational booklet and provided samples of pouching options.  Answered patient questions, patient is a little confused, no family in the room. Ask repetitive questions about when his surgery will take place  Examined patient lying, sitting in order to place the marking in the patient's visual field, away from any creases or abdominal contour issues and within the rectus muscle.  Marked for colostomy in the LLQ  _5.5___ cm to the left of the umbilicus and _4.7___QQ below the umbilicus.     Patient's abdomen cleansed with CHG wipes at site markings, allowed to air dry prior to marking.Covered mark with thin film transparent dressing to preserve mark until date of surgery.   [facility] Nurse team will follow up with patient after surgery for continue ostomy care and teaching.  [place] MSN, [place], [place], [place]

## 2017-04-23 ENCOUNTER — Other Ambulatory Visit: Payer: Self-pay | Admitting: Urology

## 2017-04-23 DIAGNOSIS — N189 Chronic kidney disease, unspecified: Secondary | ICD-10-CM

## 2017-04-23 LAB — MAGNESIUM: Magnesium: 1.6 mg/dL — ABNORMAL LOW (ref 1.7–2.4)

## 2017-04-23 LAB — COMPREHENSIVE METABOLIC PANEL
ALBUMIN: 1.8 g/dL — AB (ref 3.5–5.0)
ALK PHOS: 210 U/L — AB (ref 38–126)
ALT: 20 U/L (ref 17–63)
AST: 40 U/L (ref 15–41)
Anion gap: 8 (ref 5–15)
BILIRUBIN TOTAL: 0.7 mg/dL (ref 0.3–1.2)
BUN: 56 mg/dL — AB (ref 6–20)
CALCIUM: 7.7 mg/dL — AB (ref 8.9–10.3)
CO2: 13 mmol/L — ABNORMAL LOW (ref 22–32)
Chloride: 116 mmol/L — ABNORMAL HIGH (ref 101–111)
Creatinine, Ser: 1.25 mg/dL — ABNORMAL HIGH (ref 0.61–1.24)
GFR calc Af Amer: >60 mL/min (ref >60)
GFR, EST NON AFRICAN AMERICAN: 55 mL/min — AB (ref >60)
GLUCOSE: 90 mg/dL (ref 65–99)
Potassium: 4.2 mmol/L (ref 3.5–5.1)
Sodium: 137 mmol/L (ref 135–145)
TOTAL PROTEIN: 5.3 g/dL — AB (ref 6.5–8.1)

## 2017-04-23 LAB — CULTURE, BLOOD (ROUTINE X 2)
CULTURE: NO GROWTH
SPECIAL REQUESTS: ADEQUATE

## 2017-04-23 LAB — PREPARE RBC (CROSSMATCH)

## 2017-04-23 LAB — VANCOMYCIN, TROUGH: Vancomycin Tr: 22 ug/mL (ref 15–20)

## 2017-04-23 LAB — CBC
HCT: 23.4 % — ABNORMAL LOW (ref 39.0–52.0)
Hemoglobin: 7.6 g/dL — ABNORMAL LOW (ref 13.0–17.0)
MCH: 27 pg (ref 26.0–34.0)
MCHC: 32.5 g/dL (ref 30.0–36.0)
MCV: 83.3 fL (ref 78.0–100.0)
PLATELETS: 289 10*3/uL (ref 150–400)
RBC: 2.81 MIL/uL — ABNORMAL LOW (ref 4.22–5.81)
RDW: 23.5 % — AB (ref 11.5–15.5)
WBC: 17.7 10*3/uL — ABNORMAL HIGH (ref 4.0–10.5)

## 2017-04-23 LAB — PROTIME-INR
INR: 1.23
PROTHROMBIN TIME: 15.4 s — AB (ref 11.4–15.2)

## 2017-04-23 LAB — PHOSPHORUS: Phosphorus: <1 mg/dL — CL (ref 2.5–4.6)

## 2017-04-23 MED ORDER — CHLORHEXIDINE GLUCONATE CLOTH 2 % EX PADS: 6.0000 | MEDICATED_PAD | Freq: Once | CUTANEOUS | Status: DC

## 2017-04-23 MED ORDER — SODIUM CHLORIDE 0.9 % IV SOLN: Freq: Once | INTRAVENOUS | Status: DC

## 2017-04-23 MED ORDER — VANCOMYCIN HCL IN DEXTROSE 750-5 MG/150ML-% IV SOLN
750.0000 mg | INTRAVENOUS | Status: DC
  Administered 2017-04-23 – 2017-04-30 (×8): 750 mg via INTRAVENOUS
  Filled 2017-04-23 (×9): qty 150

## 2017-04-23 MED ORDER — MAGNESIUM SULFATE 2 GM/50ML IV SOLN
2.0000 g | Freq: Once | INTRAVENOUS | Status: AC
  Administered 2017-04-23: 2 g via INTRAVENOUS
  Filled 2017-04-23: qty 50

## 2017-04-23 MED ORDER — SODIUM PHOSPHATES 45 MMOLE/15ML IV SOLN
20.0000 mmol | Freq: Once | INTRAVENOUS | Status: AC
  Administered 2017-04-23: 20 mmol via INTRAVENOUS
  Filled 2017-04-23: qty 6.67

## 2017-04-23 MED ORDER — PIPERACILLIN-TAZOBACTAM 3.375 G IVPB
3.3750 g | Freq: Three times a day (TID) | INTRAVENOUS | Status: DC
  Administered 2017-04-23 – 2017-05-01 (×22): 3.375 g via INTRAVENOUS
  Filled 2017-04-23 (×24): qty 50

## 2017-04-23 MED ORDER — POLYETHYLENE GLYCOL 3350 17 GM/SCOOP PO POWD
1.0000 | Freq: Once | ORAL | Status: AC
  Administered 2017-04-23: 255 g via ORAL
  Filled 2017-04-23 (×2): qty 255

## 2017-04-23 MED ORDER — CHLORHEXIDINE GLUCONATE CLOTH 2 % EX PADS
6.0000 | MEDICATED_PAD | Freq: Once | CUTANEOUS | Status: AC
  Administered 2017-04-23: 6 via TOPICAL

## 2017-04-23 MED ORDER — VANCOMYCIN HCL IN DEXTROSE 1-5 GM/200ML-% IV SOLN
1000.0000 mg | INTRAVENOUS | Status: DC
  Filled 2017-04-23: qty 200

## 2017-04-23 MED ORDER — SODIUM BICARBONATE 650 MG PO TABS
1300.0000 mg | ORAL_TABLET | Freq: Two times a day (BID) | ORAL | Status: DC
  Administered 2017-04-23 – 2017-04-29 (×12): 1300 mg via ORAL
  Filled 2017-04-23 (×12): qty 2

## 2017-04-23 MED ORDER — DEXTROSE 5 % IV SOLN
2.0000 g | INTRAVENOUS | Status: DC
  Filled 2017-04-23: qty 2

## 2017-04-23 NOTE — Progress Notes (Signed)
[facility] for Infectious Disease    Date of Admission:  04/18/2017   Total days of antibiotics 6/vanco/mero   ID: ARGENIS KUMARI is a 76 y.o. male with chronic hep c, prostate ca, DM2 admitted for worsening melenic stools found to have WBC of 41K, and abdominal Ct showing prostatic urethral-rectal fistula. Infectious work up revealed MRSA bacteremia in 1 of 4 cx. On 10/12 Active Problems:   Altered mental status   Acute renal failure (HCC)   Metabolic acidosis   Melena   Diarrhea in adult patient   Heme positive stool   Perineal abscess   Recto-prostatic fistula   MRSA bacteremia   Staphylococcus aureus bacteremia with sepsis (HCC)   Severe sepsis with septic shock (HCC)   Leukocytosis    Subjective: Afebrile,  Wbc increased from 15.4 to 17.7  ROS - unable to tell us due to encephalopathy and decreased hearing  Medications:  . chlorhexidine  15 mL Mouth Rinse BID  . Chlorhexidine Gluconate Cloth  6 each Topical Once   And  . Chlorhexidine Gluconate Cloth  6 each Topical Once  . mouth rinse  15 mL Mouth Rinse q12n4p  . sodium bicarbonate  1,300 mg Oral BID    Objective: Vital signs in last 24 hours: Temp:  [98.5 F (36.9 C)-99.9 F (37.7 C)] 98.5 F (36.9 C) (10/17 1440) Pulse Rate:  [86-93] 86 (10/17 1440) Resp:  [16] 16 (10/17 1440) BP: (106-111)/(62-68) 106/66 (10/17 1440) SpO2:  [100 %] 100 % (10/17 1440) Weight:  [167 lb (75.8 kg)] 167 lb (75.8 kg) (10/16 2055) Physical Exam  Constitutional: He is oriented to person, only. He appears chronically ill. No distress.  HENT:  Mouth/Throat: edentulous Cardiovascular: Normal rate, regular rhythm and normal heart sounds. Exam reveals no gallop and no friction rub.  No murmur heard.  Pulmonary/Chest: Effort normal and breath sounds normal. No respiratory distress. He has no wheezes.  Abdominal: Soft. Bowel sounds are normal. He exhibits no distension. There is no tenderness.  Neurological: He is alert and  oriented to person,only Skin: Skin is warm and dry. No rash noted. No erythema.    Lab Results  Recent Labs  04/22/17 0255 04/23/17 0356  WBC 15.4* 17.7*  HGB 7.9* 7.6*  HCT 24.1* 23.4*  NA 144 137  K 2.9* 4.2  CL 120* 116*  CO2 14* 13*  BUN 76* 56*  CREATININE 1.52* 1.25*   Liver Panel  Recent Labs  04/22/17 0255 04/23/17 0356  PROT 5.5* 5.3*  ALBUMIN 1.9* 1.8*  AST 33 40  ALT 22 20  ALKPHOS 214* 210*  BILITOT 0.7 0.7    Microbiology: 10/14 blood cx ngtd 10/12 blood cx mrsa 1 of 4 bottles Studies/Results: No results found.   Assessment/Plan: 76 yo M with liver disease found to have Rectourethral fistula, and transient MRSA bacteremia  - currently on broad spectrum abtx. No cultures to suggest esbl infection. Will change mero to piptazo - continue on current coverage until after his surgery tomorrow   mrsa bacteremia = unclear if true pathogen vs potential colonization since only 1 of 4 bottles positive. It was collected on admit where he had leukocytosis and found to have rectourethral fistula. This could also be transient bacteremia from fistula. Will plan to treat minimum of 14 days.  portossytemic -Encephalopathy = not sure how much etoh use, concern for thiamine/folate deficiencies associated with alcoholism. Consider repletion. Repeat ammonia appears improved -    Jeda Pardue, [facility] for  Infectious Diseases Cell: [phone] Pager: [phone]  04/23/2017, 8:20 PM

## 2017-04-23 NOTE — Progress Notes (Signed)
Received critical lab result--Phosphorus--less than 1.0. Notified on call K. Schorr,NP via text page at [phone].

## 2017-04-23 NOTE — Progress Notes (Signed)
Pharmacy Antibiotic Note--Follow-up  Colin [name] is a 76 y.o. male admitted on 04/18/2017 with urethral/rectal fistula. He now has MRSA bacteremia.  Pharmacy has been consulted for vancomycin dosing. Pt is afebrile, WBC is 17.7, Scr continuing to trend down at 1.25 today. Dose has been changing over the past few days. Vanc trough drawn early in order to make sure the concentration was therapeutic due to MRSA bacteremia. Trough 22 today. Scr trending down over past few days.   Plan: Vancomycin 750 IV every 24 hours.  Goal trough 15-20 mcg/mL. Start at 1600. Monitor Scr, WBC, clinical signs and symptoms If renal function continues to improve, the patient may need a higher dose to maintain therapeutic vanc concentrations.  Repeat trough as necessary with changing renal function  Height: 5\' 6"  (167.6 cm) Weight: 167 lb (75.8 kg) (bedscale) IBW/kg (Calculated) : 63.8  Temp (24hrs), Avg:99.4 F (37.4 C), Min:99.1 F (37.3 C), Max:99.9 F (37.7 C)   Recent Labs Lab 04/18/17 1909 04/18/17 2116  04/19/17 1929 04/20/17 0531 04/21/17 0241 04/22/17 0255 04/23/17 0356 04/23/17 1247  WBC 41.2*  --   < > 21.2* 20.5* 18.9* 15.4* 17.7*  --   CREATININE 4.26*  --   < > 2.61* 2.41* 1.85* 1.52* 1.25*  --   LATICACIDVEN 1.2 0.9  --   --   --   --   --   --   --   VANCOTROUGH  --   --   --   --   --   --   --   --  22*  < > = values in this interval not displayed.  Estimated Creatinine Clearance: 46.1 mL/min (A) (by C-G formula based on SCr of 1.25 mg/dL (H)).    No Known Allergies  Antimicrobials this admission: Vanc 10/12 >> Merepenem 10/12 >>   Dose adjustments this admission: 10/12: 1000 mg bolus 10/13: 500 mg q48 10/14: 750 mg q24 10/15: 750 mg q24 10/16: 1000 mg q24  Microbiology results: 10/12 BCx: MRSA 1/2 10/14 BCx: no growth in 2 days 10/13 MRSA PCR: NEG  Thank you for allowing pharmacy to be a part of this patient's care.  Florinda Marker  PharmD Candidate 04/23/2017  2:06 PM

## 2017-04-23 NOTE — Anesthesia Preprocedure Evaluation (Addendum)
Anesthesia Evaluation  Patient identified by MRN, date of birth, ID band Patient confused    Reviewed: Allergy & Precautions, NPO status , Patient's Chart, lab work & pertinent test results  Airway Mallampati: III  TM Distance: >3 FB Neck ROM: Full    Dental  (+) Edentulous Upper, Edentulous Lower   Pulmonary former smoker,    breath sounds clear to auscultation       Cardiovascular hypertension, Pt. on medications + Peripheral Vascular Disease   Rhythm:Regular Rate:Normal  TTE 04/2017 - normal EF, no valvulopathy noted   Neuro/Psych negative neurological ROS  negative psych ROS   GI/Hepatic (+) Hepatitis -, CRectourethral fistula   Endo/Other  diabetes, Type 2  Renal/GU CRFRenal disease   Prostate cancer    Musculoskeletal  (+) Arthritis ,   Abdominal   Peds  Hematology  (+) anemia ,   Anesthesia Other Findings   Reproductive/Obstetrics                            Lab Results  Component Value Date   WBC 17.7 (H) 04/23/2017   HGB 7.6 (L) 04/23/2017   HCT 23.4 (L) 04/23/2017   MCV 83.3 04/23/2017   PLT 289 04/23/2017   Lab Results  Component Value Date   CREATININE 1.25 (H) 04/23/2017   BUN 56 (H) 04/23/2017   NA 137 04/23/2017   K 4.2 04/23/2017   CL 116 (H) 04/23/2017   CO2 13 (L) 04/23/2017    Anesthesia Physical  Anesthesia Plan  ASA: III  Anesthesia Plan: General   Post-op Pain Management:    Induction: Intravenous  PONV Risk Score and Plan: 4 or greater and Ondansetron, Dexamethasone and Treatment may vary due to age or medical condition  Airway Management Planned: Oral ETT  Additional Equipment: None  Intra-op Plan:   Post-operative Plan: Extubation in OR  Informed Consent: I have reviewed the patients History and Physical, chart, labs and discussed the procedure including the risks, benefits and alternatives for the proposed anesthesia with the patient  or authorized representative who has indicated his/her understanding and acceptance.   Dental advisory given  Plan Discussed with: CRNA  Anesthesia Plan Comments:        Anesthesia Quick Evaluation

## 2017-04-23 NOTE — Progress Notes (Signed)
Colin [name]  ZOX:[id number] DOB: [birth year] DOA: 04/18/2017 PCP: Elfredia Nevins, MD    Brief Narrative:  76yo male with a Hx of stage 3 CKD, ETOHism, Hepatitis C, HTN, Anemia, Prostate Cancer, and DM2 who presented to [facility] on 10/12 with "black" stool for one month which had progressively worsened over 3 days. Hemoglobin 8.8, WBC 41.2. C.Diff negative, CT A/P with apparent fistula between the prostate gland / prostatic urethra and the anterior wall of the rectum. Foley was placed with black stool output noted. Patient was transferred to [facility] for further management. General surgery, urology continue to see and plan surgery 10/18.  Significant Events: 10/12 admit through [facility] ED  Subjective: Reports that he feels "okay". Reports some pain in lower abdomen but no nausea or vomiting.  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 Discussed with surgical team. Plan is for laparoscopic versus open sigmoid colostomy by surgery and suprapubic cystostomy by urology.  Periurethral abscess Urology to assess prior to surgery   GI Bleed - Acute blood loss anemia  Hemoglobin stable in the mid 7 g range. Follow CBCs closely and transfuse if hemoglobin is 7 or less.  MRSA bacteremia  ID directing care of this issue . As per ID, continue broad-spectrum antibiotics until surgery and then will narrow thereafter.  Severe hypokalemia  likely due to signif GI losses due to above. Replaced. Follow BMP closely and replace as needed.  Non-anion gap metabolic acidosis Due to renal failure as well as possible GI bicarb losses. Start oral sodium bicarbonate tablets and follow BMP closely.   Uremia  Slowly improving - cont to hydrate . Creatinine continues to improve  Acute Delirium  Likely due to uremia / toxic met enceph. Probably resolved.  Hypernatremia  Reflective of free  water deficit - corrected w/ free water resuscitation    Elevated Troponin in setting of demand ischemia  Troponin never signif elevated   HTN BP currently well controlled   AKI on CKD Stage 3 baseline Crt 1.4-1.8 - crt now at his baseline   Liver Cirrhosis - Chronic Hep C   ?Cellulitis to Right LE  No evidence on exam today to suggest an active infection - B LE venous duplex w/o DVT on 04/19/17. Stable without change.  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  DVT prophylaxis: SCDs Code Status: FULL CODE Family Communication: no family present at time of exam  Disposition Plan: SNF when medically stable for discharge.   Consultants:  PCCM Gen Surgery Urology  Nephrology  ID  Antimicrobials:  Vancomycin 10/12 > Meropenem 10/12 >   Objective: Vitals:   04/22/17 1800 04/22/17 2055 04/23/17 0443 04/23/17 1440  BP: 104/66 111/62 109/68 106/66  Pulse: 77 88 93 86  Resp: 18 16  16   Temp:  99.3 F (37.4 C) 99.9 F (37.7 C) 98.5 F (36.9 C)  TempSrc:  Oral Oral Oral  SpO2: 100% 100% 100% 100%  Weight:  75.8 kg (167 lb)    Height:  5\' 6"  (1.676  m)        Intake/Output Summary (Last 24 hours) at 04/23/17 1731 Last data filed at 04/23/17 1728  Gross per 24 hour  Intake          1188.75 ml  Output             1100 ml  Net            88.75 ml   Filed Weights   04/21/17 0330 04/22/17 0405 04/22/17 2055  Weight: 68.9 kg (151 lb 14.4 oz) 68.9 kg (151 lb 14.4 oz) 75.8 kg (167 lb)    Examination: General: Elderly male, moderately built and nourished, sitting up comfortably in chair this morning. Lungs: Clear to auscultation. No increased  work of breathing. Cardiovascular: S1 and S2 heard, RRR. No JVD, murmurs or pedal edema. Abdomen: Nondistended, soft and nontender. Normal bowel sounds heard. Foley catheter +. Extremities: Moving all limbs symmetrically. No cyanosis clubbing or edema. CNS: Alert and oriented. No focal neurological deficits.  CBC:  Recent Labs Lab 04/18/17 1909  04/19/17 1929 04/20/17 0531 04/21/17 0241 04/22/17 0255 04/23/17 0356  WBC 41.2*  < > 21.2* 20.5* 18.9* 15.4* 17.7*  NEUTROABS 39.3*  --   --   --   --   --   --   HGB 8.8*  < > 7.8* 8.3* 7.6* 7.9* 7.6*  HCT 26.3*  < > 23.2* 25.1* 22.6* 24.1* 23.4*  MCV 82.7  < > 79.2 79.4 79.0 81.4 83.3  PLT 404*  < > 369 379 361 332 289  < > = values in this interval not displayed. Basic Metabolic Panel:  Recent Labs Lab 04/19/17 0342 04/19/17 1929 04/20/17 0531 04/21/17 0241 04/22/17 0255 04/23/17 0356  NA 141 141 143 148* 144 137  K 2.8* 2.4* 2.8* 2.5* 2.9* 4.2  CL 114* 116* 116* 125* 120* 116*  CO2 8* 12* 14* 14* 14* 13*  GLUCOSE 120* 136* 123* 121* 111* 90  BUN 136* 123* 116* 104* 76* 56*  CREATININE 3.71* 2.61* 2.41* 1.85* 1.52* 1.25*  CALCIUM 8.5* 8.0* 8.2* 8.1* 7.8* 7.7*  MG 2.6*  --  2.1 2.1  --  1.6*  PHOS 4.4  --  2.3* 1.7*  --  <1.0*   GFR: Estimated Creatinine Clearance: 46.1 mL/min (A) (by C-G formula based on SCr of 1.25 mg/dL (H)).  Liver Function Tests:  Recent Labs Lab 04/18/17 1909 04/22/17 0255 04/23/17 0356  AST 50* 33 40  ALT 28 22 20   ALKPHOS 223* 214* 210*  BILITOT 0.7 0.7 0.7  PROT 7.7 5.5* 5.3*  ALBUMIN 2.7* 1.9* 1.8*    Recent Labs Lab 04/18/17 1909 04/21/17 1151  AMMONIA 92* 20    Coagulation Profile:  Recent Labs Lab 04/18/17 1909 04/23/17 0356  INR 1.26 1.23    Cardiac Enzymes:  Recent Labs Lab 04/18/17 1909 04/19/17 0443 04/19/17 1441  TROPONINI 0.04* 0.05* 0.04*     CBG:  Recent Labs Lab 04/21/17 0808 04/21/17 1126 04/21/17 1548 04/21/17 2006 04/21/17 2326    GLUCAP 101* 111* 137* 130* 115*    Recent Results (from the past 240 hour(s))  Blood Culture (routine x 2)     Status: Abnormal   Collection Time: 04/18/17  7:09 PM  Result Value Ref Range Status   Specimen Description RIGHT ANTECUBITAL  Final   Special Requests   Final    BOTTLES DRAWN AEROBIC AND ANAEROBIC Blood Culture adequate volume   Culture  Setup Time   Final  GRAM POSITIVE COCCI AEROBIC BOTTLE ONLY Gram Stain Report Called to,Read Back By and Verified With: EVERETT,R @1604  BY MATTHEWS, B 10.13.18 Performed at [facility] CRITICAL RESULT CALLED TO, READ BACK BY AND VERIFIED WITH: Verna Czech [id number] MLM Performed at [facility], [address] [place], [place] [address]    Culture METHICILLIN RESISTANT STAPHYLOCOCCUS AUREUS (A)  Final   Report Status 04/21/2017 FINAL  Final   Organism ID, Bacteria METHICILLIN RESISTANT STAPHYLOCOCCUS AUREUS  Final      Susceptibility   Methicillin resistant staphylococcus aureus - MIC*    CIPROFLOXACIN >=8 RESISTANT Resistant     ERYTHROMYCIN >=8 RESISTANT Resistant     GENTAMICIN <=0.5 SENSITIVE Sensitive     OXACILLIN >=4 RESISTANT Resistant     TETRACYCLINE <=1 SENSITIVE Sensitive     VANCOMYCIN 1 SENSITIVE Sensitive     TRIMETH/SULFA <=10 SENSITIVE Sensitive     CLINDAMYCIN >=8 RESISTANT Resistant     RIFAMPIN <=0.5 SENSITIVE Sensitive     Inducible Clindamycin NEGATIVE Sensitive     * METHICILLIN RESISTANT STAPHYLOCOCCUS AUREUS  Blood Culture ID Panel (Reflexed)     Status: Abnormal   Collection Time: 04/18/17  7:09 PM  Result Value Ref Range Status   Enterococcus species NOT DETECTED NOT DETECTED Final   Listeria monocytogenes NOT DETECTED NOT DETECTED Final   Staphylococcus species DETECTED (A) NOT DETECTED Final    Comment: CRITICAL RESULT CALLED TO, READ BACK BY AND VERIFIED WITH: PHARMD R RUMBARGER [phone] MLM    Staphylococcus aureus DETECTED (A) NOT DETECTED Final    Comment:  Methicillin (oxacillin)-resistant Staphylococcus aureus (MRSA). MRSA is predictably resistant to beta-lactam antibiotics (except ceftaroline). Preferred therapy is vancomycin unless clinically contraindicated. Patient requires contact precautions if  hospitalized. CRITICAL RESULT CALLED TO, READ BACK BY AND VERIFIED WITH: PHARMD R RUMBARGER [phone] MLM    Methicillin resistance DETECTED (A) NOT DETECTED Final    Comment: CRITICAL RESULT CALLED TO, READ BACK BY AND VERIFIED WITH: PHARMD R RUMBARGER [phone] MLM    Streptococcus species NOT DETECTED NOT DETECTED Final   Streptococcus agalactiae NOT DETECTED NOT DETECTED Final   Streptococcus pneumoniae NOT DETECTED NOT DETECTED Final   Streptococcus pyogenes NOT DETECTED NOT DETECTED Final   Acinetobacter baumannii NOT DETECTED NOT DETECTED Final   Enterobacteriaceae species NOT DETECTED NOT DETECTED Final   Enterobacter cloacae complex NOT DETECTED NOT DETECTED Final   Escherichia coli NOT DETECTED NOT DETECTED Final   Klebsiella oxytoca NOT DETECTED NOT DETECTED Final   Klebsiella pneumoniae NOT DETECTED NOT DETECTED Final   Proteus species NOT DETECTED NOT DETECTED Final   Serratia marcescens NOT DETECTED NOT DETECTED Final   Haemophilus influenzae NOT DETECTED NOT DETECTED Final   Neisseria meningitidis NOT DETECTED NOT DETECTED Final   Pseudomonas aeruginosa NOT DETECTED NOT DETECTED Final   Candida albicans NOT DETECTED NOT DETECTED Final   Candida glabrata NOT DETECTED NOT DETECTED Final   Candida krusei NOT DETECTED NOT DETECTED Final   Candida parapsilosis NOT DETECTED NOT DETECTED Final   Candida tropicalis NOT DETECTED NOT DETECTED Final    Comment: Performed at [facility], [address] [place], [place] [address]  Gastrointestinal Panel by PCR , Stool     Status: None   Collection Time: 04/18/17  9:05 PM  Result Value Ref Range Status   Campylobacter species NOT DETECTED NOT DETECTED Final   Plesimonas  shigelloides NOT DETECTED NOT DETECTED Final   Salmonella species NOT  DETECTED NOT DETECTED Final   Yersinia enterocolitica NOT DETECTED NOT DETECTED Final   Vibrio species NOT DETECTED NOT DETECTED Final   Vibrio cholerae NOT DETECTED NOT DETECTED Final   Enteroaggregative E coli (EAEC) NOT DETECTED NOT DETECTED Final   Enteropathogenic E coli (EPEC) NOT DETECTED NOT DETECTED Final   Enterotoxigenic E coli (ETEC) NOT DETECTED NOT DETECTED Final   Shiga like toxin producing E coli (STEC) NOT DETECTED NOT DETECTED Final   Shigella/Enteroinvasive E coli (EIEC) NOT DETECTED NOT DETECTED Final   Cryptosporidium NOT DETECTED NOT DETECTED Final   Cyclospora cayetanensis NOT DETECTED NOT DETECTED Final   Entamoeba histolytica NOT DETECTED NOT DETECTED Final   Giardia lamblia NOT DETECTED NOT DETECTED Final   Adenovirus F40/41 NOT DETECTED NOT DETECTED Final   Astrovirus NOT DETECTED NOT DETECTED Final   Norovirus GI/GII NOT DETECTED NOT DETECTED Final   Rotavirus A NOT DETECTED NOT DETECTED Final   Sapovirus (I, II, IV, and V) NOT DETECTED NOT DETECTED Final  C difficile quick scan w PCR reflex     Status: None   Collection Time: 04/18/17  9:05 PM  Result Value Ref Range Status   C Diff antigen NEGATIVE NEGATIVE Final   C Diff toxin NEGATIVE NEGATIVE Final   C Diff interpretation No C. difficile detected.  Final    Comment: VALID  Blood Culture (routine x 2)     Status: None   Collection Time: 04/18/17 10:26 PM  Result Value Ref Range Status   Specimen Description RIGHT ANTECUBITAL  Final   Special Requests   Final    BOTTLES DRAWN AEROBIC ONLY Blood Culture adequate volume   Culture NO GROWTH 5 DAYS  Final   Report Status 04/23/2017 FINAL  Final  MRSA PCR Screening     Status: None   Collection Time: 04/19/17  3:06 AM  Result Value Ref Range Status   MRSA by PCR NEGATIVE NEGATIVE Final    Comment:        The GeneXpert MRSA Assay (FDA approved for NASAL specimens only), is one  component of a comprehensive MRSA colonization surveillance program. It is not intended to diagnose MRSA infection nor to guide or monitor treatment for MRSA infections.   Culture, blood (Routine X 2) w Reflex to ID Panel     Status: None (Preliminary result)   Collection Time: 04/20/17 11:35 AM  Result Value Ref Range Status   Specimen Description BLOOD BLOOD RIGHT HAND  Final   Special Requests   Final    BOTTLES DRAWN AEROBIC AND ANAEROBIC Blood Culture adequate volume   Culture NO GROWTH 3 DAYS  Final   Report Status PENDING  Incomplete  Culture, blood (Routine X 2) w Reflex to ID Panel     Status: None (Preliminary result)   Collection Time: 04/20/17 11:41 AM  Result Value Ref Range Status   Specimen Description BLOOD BLOOD LEFT HAND  Final   Special Requests IN PEDIATRIC BOTTLE Blood Culture adequate volume  Final   Culture NO GROWTH 3 DAYS  Final   Report Status PENDING  Incomplete     Scheduled Meds: . chlorhexidine  15 mL Mouth Rinse BID  . Chlorhexidine Gluconate Cloth  6 each Topical Once   And  . Chlorhexidine Gluconate Cloth  6 each Topical Once  . mouth rinse  15 mL Mouth Rinse q12n4p  . polyethylene glycol powder  1 Container Oral Once     LOS: 5 days   Audryanna Zurita, MD, FACP, FHM.  [facility] Pager [phone]  If 7PM-7AM, please contact night-coverage [email or web address] Password [email or web address] 04/23/2017, 5:40 PM

## 2017-04-23 NOTE — Evaluation (Signed)
Occupational Therapy Evaluation Patient Details Name: Colin [name] MRN: [id number] DOB: 04/02/1941 Today's Date: 04/23/2017    History of Present Illness Pt admitted 04/18/17 with GIB, recto-prostatic fistula, periurethral abscess. Hospital course complicated by delirium, elevated troponin (demand), hypkalemia, hyponatremia, +MRSA. PMH: CKD 3, remote alchoholism, Hep C, HTN, prostate cancer, DM.   Clinical Impression   Pt lived alone with intermitted assist for IADL of his son. He reports driving and ambulating with a cane. Pt presents with generalized weakness, buttocks and LE pain, impaired cognition and poor standing balance. Pt disoriented upon OTs arrival, but improved by end of session. Pt to undergo surgery 04/24/17. Will need post acute rehab in SNF upon discharge. Will follow.    Follow Up Recommendations  SNF;Supervision/Assistance - 24 hour    Equipment Recommendations   (defer to next venue)    Recommendations for Other Services       Precautions / Restrictions Precautions Precautions: Fall Restrictions Weight Bearing Restrictions: No      Mobility Bed Mobility Overal bed mobility: Needs Assistance Bed Mobility: Supine to Sit     Supine to sit: Mod assist     General bed mobility comments: cues for technique, assist for LEs over EOB, to raise trunk and position hips at EOB with pad  Transfers Overall transfer level: Needs assistance Equipment used: Rolling walker (2 wheeled) Transfers: Sit to/from [facility] Sit to Stand: Min assist Stand pivot transfers: Min assist       General transfer comment: Cues for hand placement, assist to rise and steady    Balance Overall balance assessment: Needs assistance   Sitting balance-Leahy Scale: Fair Sitting balance - Comments: statically     Standing balance-Leahy Scale: Poor Standing balance comment: reliant on B UE support                           ADL either performed or  assessed with clinical judgement   ADL Overall ADL's : Needs assistance/impaired Eating/Feeding: Set up;Sitting   Grooming: Wash/dry hands;Wash/dry face;Sitting;Supervision/safety   Upper Body Bathing: Moderate assistance;Sitting   Lower Body Bathing: Total assistance;Sit to/from stand   Upper Body Dressing : Minimal assistance;Sitting   Lower Body Dressing: Total assistance;Sitting/lateral leans   Toilet Transfer: Minimal assistance;Stand-pivot;RW Toilet Transfer Details (indicate cue type and reason): simulated to chair Toileting- Clothing Manipulation and Hygiene: Total assistance;Sit to/from stand               Vision Baseline Vision/History: Wears glasses Wears Glasses: Reading only Patient Visual Report: No change from baseline       Perception     Praxis      Pertinent Vitals/Pain Pain Assessment: Faces Faces Pain Scale: Hurts even more Pain Location: buttocks, LEs Pain Descriptors / Indicators: Grimacing;Guarding;Moaning Pain Intervention(s): Monitored during session;Repositioned     Hand Dominance Right   Extremity/Trunk Assessment Upper Extremity Assessment Upper Extremity Assessment: Generalized weakness   Lower Extremity Assessment Lower Extremity Assessment: Defer to PT evaluation       Communication Communication Communication: HOH   Cognition Arousal/Alertness: Awake/alert Behavior During Therapy: Flat affect Overall Cognitive Status: Impaired/Different from baseline Area of Impairment: Memory;Orientation;Following commands;Safety/judgement;Problem solving                 Orientation Level: Disoriented to;Situation (initially thought he was at home and it was January)   Memory: Decreased short-term memory Following Commands: Follows one step commands with increased time Safety/Judgement: Decreased awareness of deficits  Problem Solving: Slow processing;Decreased initiation;Difficulty sequencing;Requires verbal cues;Requires  tactile cues     General Comments       Exercises     Shoulder Instructions      Home Living Family/patient expects to be discharged to:: Skilled nursing facility                                 Additional Comments: per son      Prior Functioning/Environment Level of Independence: Independent with assistive device(s)        Comments: ambulated with a cane, son helped with some IADL, visited 3 x a week.        OT Problem List: Decreased strength;Decreased activity tolerance;Impaired balance (sitting and/or standing);Decreased cognition;Decreased knowledge of use of DME or AE;Pain;Increased edema      OT Treatment/Interventions: Self-care/ADL training;DME and/or AE instruction;Therapeutic activities;Cognitive remediation/compensation;Patient/family education;Balance training    OT Goals(Current goals can be found in the care plan section) Acute Rehab OT Goals Patient Stated Goal: family wants rehab prior to return home OT Goal Formulation: With patient/family Time For Goal Achievement: 05/07/17 Potential to Achieve Goals: Good ADL Goals Pt Will Perform Grooming: with min assist;standing Pt Will Perform Upper Body Bathing: with min assist;sitting Pt Will Perform Upper Body Dressing: with supervision;sitting Pt Will Transfer to Toilet: with min assist;ambulating;bedside commode Pt Will Perform Toileting - Clothing Manipulation and hygiene: with min assist;sit to/from stand Additional ADL Goal #1: Pt will perform bed mobility with min assist in preparation for ADL. Additional ADL Goal #2: Pt will use environmental cues to correctly respond to orientation questions.  OT Frequency: Min 2X/week   Barriers to D/C: Decreased caregiver support          Co-evaluation              AM-PAC PT "6 Clicks" Daily Activity     Outcome Measure Help from another person eating meals?: None Help from another person taking care of personal grooming?: A Little Help  from another person toileting, which includes using toliet, bedpan, or urinal?: Total Help from another person bathing (including washing, rinsing, drying)?: A Lot Help from another person to put on and taking off regular upper body clothing?: A Little Help from another person to put on and taking off regular lower body clothing?: Total 6 Click Score: 14   End of Session Equipment Utilized During Treatment: Gait belt;Rolling walker  Activity Tolerance: Patient tolerated treatment well Patient left: in chair;with call bell/phone within reach;with chair alarm set;with family/visitor present  OT Visit Diagnosis: Unsteadiness on feet (R26.81);Muscle weakness (generalized) (M62.81);Pain;Other symptoms and signs involving cognitive function                Time: 9371-6967 OT Time Calculation (min): 45 min Charges:  OT General Charges $OT Visit: 1 Visit OT Evaluation $OT Eval Moderate Complexity: 1 Mod OT Treatments $Self Care/Home Management : 23-37 mins G-Codes:     Malka So 04/23/2017, 9:57 AM  04/23/2017 Nestor Lewandowsky, OTR/L Pager: [phone]

## 2017-04-23 NOTE — Progress Notes (Signed)
CRITICAL VALUE ALERT  Critical Value:  vanc trough 22  Date & Time Notied:  04/23/17: 2620  Provider Notified: Pharmacist Ovid Curd, Dr. Algis Liming paged  Orders Received/Actions taken: hold off on giving current vanc ordered for 1330. Will probably be retimed.

## 2017-04-23 NOTE — Progress Notes (Signed)
I have discussed sp tube placement w/ pt. Plan on doing this tomorrow in conjunction w/ diverting colostomy by Dr. Dema Severin. He seems to understand the concept. It will more than likely be a permanent fixture.

## 2017-04-23 NOTE — Progress Notes (Signed)
[facility] hospital infusion coordinator will follow pt with ID team to support IV ABX at DC if ordered.  If patient discharges after hours, please call [phone].   Larry Sierras 04/23/2017, 2:44 PM

## 2017-04-23 NOTE — Progress Notes (Signed)
Patient ID: Colin [name], male   DOB: Jul 19, 1940, 76 y.o.   MRN: [id number]  [facility] Progress Note     Subjective: CC- hungry Patient up with OT. Ambulated to chair. Awake and alert this morning. Denies any abdominal pain. States that he is very hungry. Denies n/v.  Objective: Vital signs in last 24 hours: Temp:  [99.1 F (37.3 C)-99.9 F (37.7 C)] 99.9 F (37.7 C) (10/17 0443) Pulse Rate:  [75-93] 93 (10/17 0443) Resp:  [12-21] 16 (10/16 2055) BP: (104-134)/(59-76) 109/68 (10/17 0443) SpO2:  [94 %-100 %] 100 % (10/17 0443) Weight:  [167 lb (75.8 kg)] 167 lb (75.8 kg) (10/16 2055) Last BM Date: 04/22/17  Intake/Output from previous day: 10/16 0701 - 10/17 0700 In: 2478.8 [I.V.:1478.8; IV Piggyback:1000] Out: 1076 [Urine:150; Stool:926] Intake/Output this shift: No intake/output data recorded.  PE: Gen:  Alert, NAD, cooperative HEENT: EOM's intact, pupils equal and round Card:  RRR, no M/G/R heard Pulm:  CTAB, no W/R/R, effort normal Abd: Soft, NT/ND, +BS, no HSM, no hernia Ext:  Mild edema and tenderness right calf Psych: A&Ox3 (can state appropriate name, location, year, president) Skin: no rashes noted, warm and dry  Lab Results:   Recent Labs  04/22/17 0255 04/23/17 0356  WBC 15.4* 17.7*  HGB 7.9* 7.6*  HCT 24.1* 23.4*  PLT 332 289   BMET  Recent Labs  04/22/17 0255 04/23/17 0356  NA 144 137  K 2.9* 4.2  CL 120* 116*  CO2 14* 13*  GLUCOSE 111* 90  BUN 76* 56*  CREATININE 1.52* 1.25*  CALCIUM 7.8* 7.7*   PT/INR  Recent Labs  04/23/17 0356  LABPROT 15.4*  INR 1.23   CMP     Component Value Date/Time   NA 137 04/23/2017 0356   K 4.2 04/23/2017 0356   CL 116 (H) 04/23/2017 0356   CO2 13 (L) 04/23/2017 0356   GLUCOSE 90 04/23/2017 0356   BUN 56 (H) 04/23/2017 0356   CREATININE 1.25 (H) 04/23/2017 0356   CREATININE 2.66 (H) 01/27/2015 0832   CALCIUM 7.7 (L) 04/23/2017 0356   PROT 5.3 (L) 04/23/2017 0356   ALBUMIN 1.8  (L) 04/23/2017 0356   AST 40 04/23/2017 0356   ALT 20 04/23/2017 0356   ALKPHOS 210 (H) 04/23/2017 0356   BILITOT 0.7 04/23/2017 0356   GFRNONAA 55 (L) 04/23/2017 0356   GFRAA >60 04/23/2017 0356   Lipase  No results found for: LIPASE     Studies/Results: No results found.  Anti-infectives: Anti-infectives    Start     Dose/Rate Route Frequency Ordered Stop   04/23/17 1330  vancomycin (VANCOCIN) IVPB 1000 mg/200 mL premix     1,000 mg 200 mL/hr over 60 Minutes Intravenous Every 24 hours 04/23/17 0801     04/22/17 1200  vancomycin (VANCOCIN) IVPB 1000 mg/200 mL premix  Status:  Discontinued     1,000 mg 200 mL/hr over 60 Minutes Intravenous Every 24 hours 04/22/17 0800 04/23/17 0801   04/21/17 2200  meropenem (MERREM) 1 g in sodium chloride 0.9 % 100 mL IVPB     1 g 200 mL/hr over 30 Minutes Intravenous Every 12 hours 04/21/17 1535     04/21/17 0800  vancomycin (VANCOCIN) IVPB 1000 mg/200 mL premix  Status:  Discontinued     1,000 mg 200 mL/hr over 60 Minutes Intravenous Every 48 hours 04/19/17 1109 04/20/17 1100   04/20/17 1200  vancomycin (VANCOCIN) IVPB 750 mg/150 ml premix  Status:  Discontinued  750 mg 150 mL/hr over 60 Minutes Intravenous Every 24 hours 04/20/17 1100 04/22/17 0800   04/19/17 1000  meropenem (MERREM) 500 mg in sodium chloride 0.9 % 50 mL IVPB  Status:  Discontinued     500 mg 100 mL/hr over 30 Minutes Intravenous Every 12 hours 04/19/17 0612 04/21/17 1535   04/19/17 0800  vancomycin (VANCOCIN) 500 mg in sodium chloride 0.9 % 100 mL IVPB  Status:  Discontinued     500 mg 100 mL/hr over 60 Minutes Intravenous Every 48 hours 04/19/17 0612 04/19/17 1109   04/18/17 2215  vancomycin (VANCOCIN) IVPB 1000 mg/200 mL premix     1,000 mg 200 mL/hr over 60 Minutes Intravenous  Once 04/18/17 2209 04/18/17 2317   04/18/17 2215  meropenem (MERREM) 1 g in sodium chloride 0.9 % 100 mL IVPB     1 g 200 mL/hr over 30 Minutes Intravenous  Once 04/18/17 2209 04/18/17  2330       Assessment/Plan MRSA bacteremia Acute delirium - improving HTN AKI on CKD-3 Liver cirrhosis - chronic hepatitis C RLE edema - negative duplex 04/19/17 DM  Rectourethral fistula, 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 Discussed with Dr. Diona Fanti, no repeat CT scan needed from their standpoint.  ID - merrem 10/12>>, vancomycin 10/12>> FEN - IVF, CLD, NPO after MN VTE - SCDs Foley - in place Follow up - TBD  Plan - Miralax bowel prep today. Ok for clear liquids, NPO after midnight. Continue IV antibiotics. Plan for laparoscopic vs open sigmoid colostomy by our service and SP tube by urology tomorrow.   LOS: 5 days    Wellington Hampshire , [facility] 04/23/2017, 9:43 AM Pager: [phone] Consults: [phone] Mon-Fri 7:00 am-4:30 pm Sat-Sun 7:00 am-11:30 am

## 2017-04-23 NOTE — Evaluation (Signed)
Physical Therapy Evaluation Patient Details Name: Colin [name] MRN: [id number] DOB: 20-Feb-1941 Today's Date: 04/23/2017   History of Present Illness  Pt admitted 04/18/17 with GIB, recto-prostatic fistula, periurethral abscess. Hospital course complicated by delirium, elevated troponin (demand), hypkalemia, hyponatremia, +MRSA. PMH: CKD 3, remote alchoholism, Hep C, HTN, prostate cancer, DM.  Clinical Impression  Patient presents with decreased independence with mobility due to deficits listed in PT problem list.  He will benefit from skilled PT in the acute setting to allow maximized mobility prior to d/c to SNF level rehab.     Follow Up Recommendations SNF    Equipment Recommendations  Other (comment) (TBA)    Recommendations for Other Services       Precautions / Restrictions Precautions Precautions: Fall Precaution Comments: rectal pouch, foley (fistula)      Mobility  Bed Mobility Overal bed mobility: Needs Assistance Bed Mobility: Sit to Supine       Sit to supine: Mod assist   General bed mobility comments: assist for both legs into bed, cues and assist for positioning hips and shoulders  Transfers Overall transfer level: Needs assistance Equipment used: Rolling walker (2 wheeled) Transfers: Sit to/from [facility] Sit to Stand: Max assist;+2 physical assistance Stand pivot transfers: Mod assist;+2 safety/equipment;+2 physical assistance       General transfer comment: attempted x 4 sit to stand with +1 A but pt unable to get R LE under him,  +2 lifting assist to stand, pivotal steps with RW and mod A of 2 for stepping around to bed  Ambulation/Gait                Stairs            Wheelchair Mobility    Modified Rankin (Stroke Patients Only)       Balance Overall balance assessment: Needs assistance Sitting-balance support: Feet supported Sitting balance-Leahy Scale: Fair Sitting balance - Comments: statically      Standing balance-Leahy Scale: Poor Standing balance comment: reliant on B UE support                             Pertinent Vitals/Pain Faces Pain Scale: Hurts whole lot Pain Location: R LE Pain Descriptors / Indicators: Grimacing;Guarding;Moaning Pain Intervention(s): Monitored during session;Repositioned    Home Living Family/patient expects to be discharged to:: Skilled nursing facility                      Prior Function Level of Independence: Independent with assistive device(s)         Comments: ambulated with a cane, son helped with some IADL, visited 3 x a week.     Hand Dominance   Dominant Hand: Right    Extremity/Trunk Assessment   Upper Extremity Assessment Upper Extremity Assessment: Defer to OT evaluation    Lower Extremity Assessment Lower Extremity Assessment: LLE deficits/detail;RLE deficits/detail RLE Deficits / Details: limited knee flexion and weak extensiors (3+/5); ankle DF WFL LLE Deficits / Details: knee flexion WFL, strength 4-/5; scars from prior surgeries    Cervical / Trunk Assessment Cervical / Trunk Assessment: Kyphotic  Communication   Communication: HOH  Cognition Arousal/Alertness: Awake/alert Behavior During Therapy: Flat affect Overall Cognitive Status: Impaired/Different from baseline Area of Impairment: Memory;Orientation;Following commands;Safety/judgement;Problem solving                 Orientation Level: Disoriented to;Situation   Memory: Decreased short-term memory Following Commands:  Follows one step commands with increased time Safety/Judgement: Decreased awareness of deficits   Problem Solving: Slow processing;Decreased initiation;Difficulty sequencing;Requires verbal cues;Requires tactile cues        General Comments      Exercises     Assessment/Plan    PT Assessment Patient needs continued PT services  PT Problem List Decreased range of motion;Decreased strength;Decreased  mobility;Decreased activity tolerance;Decreased balance;Decreased knowledge of use of DME;Pain;Decreased knowledge of precautions;Decreased safety awareness       PT Treatment Interventions DME instruction;Gait training;Balance training;Therapeutic exercise;Functional mobility training;Patient/family education;Therapeutic activities    PT Goals (Current goals can be found in the Care Plan section)  Acute Rehab PT Goals Patient Stated Goal: family wants rehab prior to return home PT Goal Formulation: With patient/family Time For Goal Achievement: 05/01/17 Potential to Achieve Goals: Fair    Frequency     Barriers to discharge        Co-evaluation               AM-PAC PT "6 Clicks" Daily Activity  Outcome Measure Difficulty turning over in bed (including adjusting bedclothes, sheets and blankets)?: A Lot Difficulty moving from lying on back to sitting on the side of the bed? : Unable Difficulty sitting down on and standing up from a chair with arms (e.g., wheelchair, bedside commode, etc,.)?: Unable Help needed moving to and from a bed to chair (including a wheelchair)?: Total Help needed walking in hospital room?: Total Help needed climbing 3-5 steps with a railing? : Total 6 Click Score: 7    End of Session Equipment Utilized During Treatment: Gait belt Activity Tolerance: Patient limited by fatigue Patient left: in bed;with call bell/phone within reach Nurse Communication: Mobility status PT Visit Diagnosis: Other abnormalities of gait and mobility (R26.89);Muscle weakness (generalized) (M62.81);Other symptoms and signs involving the nervous system (R29.898)    Time: 2229-7989 PT Time Calculation (min) (ACUTE ONLY): 35 min   Charges:   PT Evaluation $PT Eval Moderate Complexity: 1 Mod PT Treatments $Therapeutic Activity: 8-22 mins   PT G CodesMagda [name], Colin [name] 04/23/2017   Colin [name] 04/23/2017, 2:52 PM

## 2017-04-23 NOTE — Plan of Care (Signed)
Problem: Safety: Goal: Ability to remain free from injury will improve Outcome: Progressing Bed in low position, non skid socks on, bed alarm on, personal belongings and call light in reach, hourly rounding done.

## 2017-04-24 ENCOUNTER — Inpatient Hospital Stay (HOSPITAL_COMMUNITY): Payer: Medicare Other | Admitting: Anesthesiology

## 2017-04-24 ENCOUNTER — Encounter (HOSPITAL_COMMUNITY)
Admission: EM | Disposition: A | Discharge status: Skilled Nursing Facility | Payer: Self-pay | Source: Home / Self Care | Attending: Internal Medicine

## 2017-04-24 ENCOUNTER — Encounter (HOSPITAL_COMMUNITY): Payer: Self-pay | Admitting: Certified Registered Nurse Anesthetist

## 2017-04-24 HISTORY — PX: INCISION AND DRAINAGE OF PERITONSILLAR ABCESS: SHX6257

## 2017-04-24 HISTORY — PX: INSERTION OF SUPRAPUBIC CATHETER: SHX5870

## 2017-04-24 HISTORY — PX: LAPAROSCOPIC DIVERTED COLOSTOMY: SHX5892

## 2017-04-24 LAB — HEMOGLOBIN AND HEMATOCRIT, BLOOD
HEMATOCRIT: 27 % — AB (ref 39.0–52.0)
HEMOGLOBIN: 8.5 g/dL — AB (ref 13.0–17.0)

## 2017-04-24 LAB — GLUCOSE, CAPILLARY: Glucose-Capillary: 154 mg/dL — ABNORMAL HIGH (ref 65–99)

## 2017-04-24 LAB — BASIC METABOLIC PANEL
ANION GAP: 8 (ref 5–15)
BUN: 37 mg/dL — ABNORMAL HIGH (ref 6–20)
CALCIUM: 7.3 mg/dL — AB (ref 8.9–10.3)
CO2: 13 mmol/L — ABNORMAL LOW (ref 22–32)
Chloride: 114 mmol/L — ABNORMAL HIGH (ref 101–111)
Creatinine, Ser: 1.19 mg/dL (ref 0.61–1.24)
GFR calc Af Amer: >60 mL/min (ref >60)
GFR calc non Af Amer: 58 mL/min — ABNORMAL LOW (ref >60)
Glucose, Bld: 102 mg/dL — ABNORMAL HIGH (ref 65–99)
POTASSIUM: 4.1 mmol/L (ref 3.5–5.1)
SODIUM: 135 mmol/L (ref 135–145)

## 2017-04-24 LAB — PHOSPHORUS: Phosphorus: 2 mg/dL — ABNORMAL LOW (ref 2.5–4.6)

## 2017-04-24 LAB — PREPARE RBC (CROSSMATCH)

## 2017-04-24 LAB — MAGNESIUM: MAGNESIUM: 1.9 mg/dL (ref 1.7–2.4)

## 2017-04-24 SURGERY — CREATION, COLOSTOMY, DIVERTING, LAPAROSCOPIC
Anesthesia: General | Site: Perineum

## 2017-04-24 MED ORDER — FENTANYL CITRATE (PF) 100 MCG/2ML IJ SOLN
INTRAMUSCULAR | Status: DC | PRN
  Administered 2017-04-24 (×5): 50 ug via INTRAVENOUS

## 2017-04-24 MED ORDER — ALBUMIN HUMAN 5 % IV SOLN
INTRAVENOUS | Status: DC | PRN
  Administered 2017-04-24 (×2): via INTRAVENOUS

## 2017-04-24 MED ORDER — PROPOFOL 10 MG/ML IV BOLUS
INTRAVENOUS | Status: DC | PRN
  Administered 2017-04-24: 110 mg via INTRAVENOUS

## 2017-04-24 MED ORDER — EPHEDRINE 5 MG/ML INJ
INTRAVENOUS | Status: AC
  Filled 2017-04-24: qty 10

## 2017-04-24 MED ORDER — LIDOCAINE 2% (20 MG/ML) 5 ML SYRINGE
INTRAMUSCULAR | Status: DC | PRN
  Administered 2017-04-24: 60 mg via INTRAVENOUS

## 2017-04-24 MED ORDER — LACTATED RINGERS IV SOLN
INTRAVENOUS | Status: DC | PRN
  Administered 2017-04-24: 07:00:00 via INTRAVENOUS

## 2017-04-24 MED ORDER — BUPIVACAINE-EPINEPHRINE (PF) 0.25% -1:200000 IJ SOLN
INTRAMUSCULAR | Status: AC
  Filled 2017-04-24: qty 30

## 2017-04-24 MED ORDER — DEXAMETHASONE SODIUM PHOSPHATE 10 MG/ML IJ SOLN
INTRAMUSCULAR | Status: AC
  Filled 2017-04-24: qty 1

## 2017-04-24 MED ORDER — SUGAMMADEX SODIUM 200 MG/2ML IV SOLN
INTRAVENOUS | Status: DC | PRN
  Administered 2017-04-24: 160.8 mg via INTRAVENOUS

## 2017-04-24 MED ORDER — SODIUM CHLORIDE 0.9 % IV SOLN
Freq: Once | INTRAVENOUS | Status: AC
  Administered 2017-04-24: 15:00:00 via INTRAVENOUS

## 2017-04-24 MED ORDER — 0.9 % SODIUM CHLORIDE (POUR BTL) OPTIME
TOPICAL | Status: DC | PRN
  Administered 2017-04-24 (×2): 1000 mL

## 2017-04-24 MED ORDER — FENTANYL CITRATE (PF) 250 MCG/5ML IJ SOLN
INTRAMUSCULAR | Status: AC
  Filled 2017-04-24: qty 5

## 2017-04-24 MED ORDER — EPHEDRINE SULFATE-NACL 50-0.9 MG/10ML-% IV SOSY
PREFILLED_SYRINGE | INTRAVENOUS | Status: DC | PRN
  Administered 2017-04-24: 10 mg via INTRAVENOUS

## 2017-04-24 MED ORDER — SODIUM PHOSPHATES 45 MMOLE/15ML IV SOLN
20.0000 mmol | Freq: Once | INTRAVENOUS | Status: AC
  Administered 2017-04-24: 20 mmol via INTRAVENOUS
  Filled 2017-04-24: qty 6.67

## 2017-04-24 MED ORDER — PHENYLEPHRINE 40 MCG/ML (10ML) SYRINGE FOR IV PUSH (FOR BLOOD PRESSURE SUPPORT)
PREFILLED_SYRINGE | INTRAVENOUS | Status: DC | PRN
  Administered 2017-04-24 (×2): 80 ug via INTRAVENOUS
  Administered 2017-04-24: 120 ug via INTRAVENOUS
  Administered 2017-04-24: 80 ug via INTRAVENOUS

## 2017-04-24 MED ORDER — ONDANSETRON HCL 4 MG/2ML IJ SOLN: 4.0000 mg | Freq: Once | INTRAMUSCULAR | Status: DC | PRN

## 2017-04-24 MED ORDER — HYDROMORPHONE HCL 1 MG/ML IJ SOLN
0.5000 mg | INTRAMUSCULAR | Status: DC | PRN
  Administered 2017-04-24 – 2017-05-01 (×17): 0.5 mg via INTRAVENOUS
  Filled 2017-04-24 (×18): qty 1

## 2017-04-24 MED ORDER — DEXAMETHASONE SODIUM PHOSPHATE 10 MG/ML IJ SOLN
INTRAMUSCULAR | Status: DC | PRN
  Administered 2017-04-24: 10 mg via INTRAVENOUS

## 2017-04-24 MED ORDER — PHENYLEPHRINE HCL 10 MG/ML IJ SOLN
INTRAVENOUS | Status: DC | PRN
  Administered 2017-04-24: 25 ug/min via INTRAVENOUS

## 2017-04-24 MED ORDER — OXYCODONE HCL 5 MG PO TABS
5.0000 mg | ORAL_TABLET | Freq: Four times a day (QID) | ORAL | Status: DC | PRN
  Administered 2017-04-24: 5 mg via ORAL
  Administered 2017-04-25 (×2): 10 mg via ORAL
  Administered 2017-04-25 (×2): 5 mg via ORAL
  Administered 2017-04-26 – 2017-05-01 (×13): 10 mg via ORAL
  Filled 2017-04-24: qty 2
  Filled 2017-04-24: qty 1
  Filled 2017-04-24 (×9): qty 2
  Filled 2017-04-24 (×2): qty 1
  Filled 2017-04-24 (×6): qty 2

## 2017-04-24 MED ORDER — PROPOFOL 10 MG/ML IV BOLUS
INTRAVENOUS | Status: AC
  Filled 2017-04-24: qty 20

## 2017-04-24 MED ORDER — ONDANSETRON HCL 4 MG/2ML IJ SOLN
INTRAMUSCULAR | Status: AC
  Filled 2017-04-24: qty 2

## 2017-04-24 MED ORDER — PHENYLEPHRINE 40 MCG/ML (10ML) SYRINGE FOR IV PUSH (FOR BLOOD PRESSURE SUPPORT)
PREFILLED_SYRINGE | INTRAVENOUS | Status: AC
  Filled 2017-04-24: qty 10

## 2017-04-24 MED ORDER — ROCURONIUM BROMIDE 50 MG/5ML IV SOLN
INTRAVENOUS | Status: AC
  Filled 2017-04-24: qty 2

## 2017-04-24 MED ORDER — SODIUM CHLORIDE 0.9% FLUSH
10.0000 mL | INTRAVENOUS | Status: DC | PRN
  Administered 2017-04-25 – 2017-04-28 (×3): 10 mL
  Filled 2017-04-24 (×3): qty 40

## 2017-04-24 MED ORDER — ROCURONIUM BROMIDE 10 MG/ML (PF) SYRINGE
PREFILLED_SYRINGE | INTRAVENOUS | Status: DC | PRN
  Administered 2017-04-24: 50 mg via INTRAVENOUS
  Administered 2017-04-24: 20 mg via INTRAVENOUS
  Administered 2017-04-24: 10 mg via INTRAVENOUS

## 2017-04-24 MED ORDER — BUPIVACAINE-EPINEPHRINE 0.25% -1:200000 IJ SOLN
INTRAMUSCULAR | Status: DC | PRN
  Administered 2017-04-24: 17 mL

## 2017-04-24 MED ORDER — FENTANYL CITRATE (PF) 100 MCG/2ML IJ SOLN: 25.0000 ug | INTRAMUSCULAR | Status: DC | PRN

## 2017-04-24 MED ORDER — ONDANSETRON HCL 4 MG/2ML IJ SOLN
INTRAMUSCULAR | Status: DC | PRN
  Administered 2017-04-24: 4 mg via INTRAVENOUS

## 2017-04-24 MED ORDER — SODIUM CHLORIDE 0.9 % IR SOLN
Status: DC | PRN
  Administered 2017-04-24: 1

## 2017-04-24 SURGICAL SUPPLY — 90 items
ADH SKN CLS APL DERMABOND .7 (GAUZE/BANDAGES/DRESSINGS) ×2
APPLIER CLIP ROT 10 11.4 M/L (STAPLE)
APR CLP MED LRG 11.4X10 (STAPLE)
BLADE 11 SAFETY STRL DISP (BLADE) IMPLANT
BLADE CLIPPER SURG (BLADE) IMPLANT
CANISTER SUCT 3000ML PPV (MISCELLANEOUS) ×4 IMPLANT
CATH FOLEY 2WAY SLVR  5CC 22FR (CATHETERS) ×2
CATH FOLEY 2WAY SLVR 5CC 22FR (CATHETERS) IMPLANT
CELLS DAT CNTRL 66122 CELL SVR (MISCELLANEOUS) IMPLANT
CHLORAPREP W/TINT 26ML (MISCELLANEOUS) ×4 IMPLANT
CLIP APPLIE ROT 10 11.4 M/L (STAPLE) IMPLANT
COVER MAYO STAND STRL (DRAPES) ×8 IMPLANT
COVER SURGICAL LIGHT HANDLE (MISCELLANEOUS) ×8 IMPLANT
DERMABOND ADVANCED (GAUZE/BANDAGES/DRESSINGS) ×2
DERMABOND ADVANCED .7 DNX12 (GAUZE/BANDAGES/DRESSINGS) IMPLANT
DRAPE HALF SHEET 40X57 (DRAPES) ×4 IMPLANT
DRAPE LAPAROTOMY 100X72 PEDS (DRAPES) ×2 IMPLANT
DRAPE UTILITY XL STRL (DRAPES) ×4 IMPLANT
DRAPE WARM FLUID 44X44 (DRAPE) ×4 IMPLANT
DRSG OPSITE POSTOP 4X10 (GAUZE/BANDAGES/DRESSINGS) IMPLANT
DRSG OPSITE POSTOP 4X8 (GAUZE/BANDAGES/DRESSINGS) ×2 IMPLANT
ELECT BLADE 6.5 EXT (BLADE) ×4 IMPLANT
ELECT CAUTERY BLADE 6.4 (BLADE) ×8 IMPLANT
ELECT REM PT RETURN 9FT ADLT (ELECTROSURGICAL) ×4
ELECTRODE REM PT RTRN 9FT ADLT (ELECTROSURGICAL) ×2 IMPLANT
GAUZE SPONGE 4X4 12PLY STRL LF (GAUZE/BANDAGES/DRESSINGS) ×2 IMPLANT
GEL ULTRASOUND 20GR AQUASONIC (MISCELLANEOUS) IMPLANT
GLOVE BIO SURGEON STRL SZ 6.5 (GLOVE) ×3 IMPLANT
GLOVE BIO SURGEON STRL SZ7.5 (GLOVE) ×8 IMPLANT
GLOVE BIO SURGEONS STRL SZ 6.5 (GLOVE) ×3
GLOVE BIOGEL M 8.0 STRL (GLOVE) ×4 IMPLANT
GLOVE BIOGEL PI IND STRL 6.5 (GLOVE) IMPLANT
GLOVE BIOGEL PI IND STRL 8 (GLOVE) ×4 IMPLANT
GLOVE BIOGEL PI INDICATOR 6.5 (GLOVE) ×6
GLOVE BIOGEL PI INDICATOR 8 (GLOVE) ×4
GOWN STRL REUS W/ TWL LRG LVL3 (GOWN DISPOSABLE) ×12 IMPLANT
GOWN STRL REUS W/ TWL XL LVL3 (GOWN DISPOSABLE) ×4 IMPLANT
GOWN STRL REUS W/TWL LRG LVL3 (GOWN DISPOSABLE) ×24
GOWN STRL REUS W/TWL XL LVL3 (GOWN DISPOSABLE) ×8
KIT BASIN OR (CUSTOM PROCEDURE TRAY) ×4 IMPLANT
LEGGING LITHOTOMY PAIR STRL (DRAPES) ×4 IMPLANT
NDL SPNL 22GX3.5 QUINCKE BK (NEEDLE) IMPLANT
NEEDLE SPNL 22GX3.5 QUINCKE BK (NEEDLE) ×8 IMPLANT
NS IRRIG 1000ML POUR BTL (IV SOLUTION) ×8 IMPLANT
PAD ARMBOARD 7.5X6 YLW CONV (MISCELLANEOUS) ×8 IMPLANT
PENCIL BUTTON HOLSTER BLD 10FT (ELECTRODE) ×6 IMPLANT
RETRACTOR WND ALEXIS 18 MED (MISCELLANEOUS) IMPLANT
RTRCTR WOUND ALEXIS 18CM MED (MISCELLANEOUS)
SCISSORS LAP 5X35 DISP (ENDOMECHANICALS) ×4 IMPLANT
SEALER TISSUE G2 CVD JAW 35 (ENDOMECHANICALS) IMPLANT
SEALER TISSUE G2 CVD JAW 45CM (ENDOMECHANICALS) ×2
SET IRRIG TUBING LAPAROSCOPIC (IRRIGATION / IRRIGATOR) ×2 IMPLANT
SET ULTRATHANE SUPRAPUB MACLOC (MISCELLANEOUS) ×2 IMPLANT
SHEARS HARMONIC ACE PLUS 36CM (ENDOMECHANICALS) ×2 IMPLANT
SLEEVE ENDOPATH XCEL 5M (ENDOMECHANICALS) ×6 IMPLANT
SPECIMEN JAR LARGE (MISCELLANEOUS) ×4 IMPLANT
STAPLE ECHEON FLEX 60 POW ENDO (STAPLE) ×2 IMPLANT
STAPLER VISISTAT 35W (STAPLE) ×4 IMPLANT
SUCTION POOLE TIP (SUCTIONS) ×4 IMPLANT
SURGILUBE 2OZ TUBE FLIPTOP (MISCELLANEOUS) IMPLANT
SUT MNCRL AB 4-0 PS2 18 (SUTURE) ×4 IMPLANT
SUT PDS AB 1 TP1 96 (SUTURE) ×8 IMPLANT
SUT PROLENE 2 0 CT2 30 (SUTURE) IMPLANT
SUT PROLENE 2 0 KS (SUTURE) IMPLANT
SUT SILK 2 0 SH CR/8 (SUTURE) ×4 IMPLANT
SUT SILK 2 0 TIES 10X30 (SUTURE) ×4 IMPLANT
SUT SILK 3 0 SH CR/8 (SUTURE) ×4 IMPLANT
SUT SILK 3 0 TIES 10X30 (SUTURE) ×4 IMPLANT
SUT VIC AB 2-0 UR5 27 (SUTURE) ×4 IMPLANT
SUT VIC AB 3-0 SH 18 (SUTURE) ×4 IMPLANT
SYR BULB IRRIGATION 50ML (SYRINGE) ×4 IMPLANT
SYRINGE IRR TOOMEY STRL 70CC (SYRINGE) ×6 IMPLANT
SYRINGE TOOMEY DISP (SYRINGE) ×4 IMPLANT
SYS LAPSCP GELPORT 120MM (MISCELLANEOUS)
SYSTEM LAPSCP GELPORT 120MM (MISCELLANEOUS) IMPLANT
TAPE CLOTH SURG 4X10 WHT LF (GAUZE/BANDAGES/DRESSINGS) ×2 IMPLANT
TOWEL OR 17X26 10 PK STRL BLUE (TOWEL DISPOSABLE) ×8 IMPLANT
TRAY FOLEY W/METER SILVER 16FR (SET/KITS/TRAYS/PACK) ×2 IMPLANT
TRAY LAPAROSCOPIC MC (CUSTOM PROCEDURE TRAY) ×4 IMPLANT
TRAY PROCTOSCOPIC FIBER OPTIC (SET/KITS/TRAYS/PACK) IMPLANT
TROCAR BLADELESS 12MM (ENDOMECHANICALS) ×2 IMPLANT
TROCAR XCEL 12X100 BLDLESS (ENDOMECHANICALS) IMPLANT
TROCAR XCEL BLUNT TIP 100MML (ENDOMECHANICALS) IMPLANT
TROCAR XCEL NON-BLD 11X100MML (ENDOMECHANICALS) ×2 IMPLANT
TROCAR XCEL NON-BLD 5MMX100MML (ENDOMECHANICALS) ×4 IMPLANT
TUBE CONNECTING 12'X1/4 (SUCTIONS) ×3
TUBE CONNECTING 12X1/4 (SUCTIONS) ×7 IMPLANT
TUBING INSUF HEATED (TUBING) ×4 IMPLANT
TUBING INSUFFLATION (TUBING) ×2 IMPLANT
YANKAUER SUCT BULB TIP NO VENT (SUCTIONS) ×12 IMPLANT

## 2017-04-24 NOTE — H&P (View-Only) (Signed)
I have discussed sp tube placement w/ pt. Plan on doing this tomorrow in conjunction w/ diverting colostomy by Dr. Dema Severin. He seems to understand the concept. It will more than likely be a permanent fixture.

## 2017-04-24 NOTE — Op Note (Signed)
04/18/2017 - 04/24/2017  10:49 AM  PATIENT:  Colin [name]  76 y.o. male  Patient Care Team: Redmond School, MD as PCP - General (Internal Medicine) Debara Pickett Nadean Corwin, MD (Cardiology) Gala Romney Cristopher Estimable, MD as Consulting Physician (Gastroenterology)  PRE-OPERATIVE DIAGNOSIS:  Rectourethral fistula  POST-OPERATIVE DIAGNOSIS:  Rectourethral fistula  PROCEDURE:  Laparoscopic creation of an end sigmoid colostomy  SURGEON:  Sharon Mt. Dema Severin, MD  ASSISTANT: Franchot Gallo, MD  ANESTHESIA:   general  COUNTS:  Sponge, needle and instrument counts were reported correct x2 at the conclusion of the operation.  EBL: 20cc  DRAINS: None  SPECIMEN: None  FINDINGS: Sigmoid colon appeared grossly normal. Created an end sigmoid colostomy in standard fashion at site of prior stoma marking.  DISPOSITION: PACU in satisfactory condition  INDICATION: Colin [name] is a 59yoM with hx of prostate cancer managed with XRT and brachytherapy, subsequently developed a rectouretheral fistula and was admitted to the medical ICU with severe urosepsis. It was determined that combined urinary and fecal diversion was necessary to prevent additional complications from his RUF. Surgery was consulted for performing a colostomy. The anatomy and physiology of the GI was described in detail using pictures/diagrams with Colin [name]. The pathophysiology was also detailed. The fact that this colostomy would be permanent was also discussed. He had a prior colonoscopy in 2015 which revealed no concerning findings. As such, an end colostomy was planned as opposed to a loop. The procedure, material risks (including, but not limited to, pain, bleeding, infection, scarring, damage to surrounding structures, need for additional procedures, hernia, heart attack, stroke, death), benefits and alternatives were explained. The patient's questions were answered to their satisfaction and they elected to proceed with surgery.  DESCRIPTION:  The patient was identified in preop holding and taken to the OR where they were placed on the operating room table and SCDs were placed. General endotracheal anesthesia was induced without difficulty. The patient was then prepped and draped in the usual sterile fashion. A surgical timeout was performed indicating the correct patient, procedure, positioning and need for preoperative antibiotics.  An infraumbilical midline incision was made, the umbilical stalk was grasped with a Kocher clamp and retracted outwardly. The fascia in the infraumbilical position was then incised with an 11 blade. The peritoneal cavity was gently entered bluntly. 0 Vicryl stay sutures was then placed. A Hasson canula was placed. The peritoneal cavity insufflated to 33mmHg with CO2. The laparoscope was inserted and revealed no evidence of injury to surrounding structures. Additional working ports were placed in the right lower quadrant under direct visualization - a 43mm port and a 55mm port. The sigmoid colon was identified and mobilized. The descending colon was also mobilized to facilitate reach to the abdominal wall. The site of division of the sigmoid was identified. The colon was run into the pelvis to confirm correct orientation and maturation of the correct side of the colon. The mid sigmoid colon was selected. A window was created in the mesentery with EnSeal. Hemostasis verified. The colon was then divided with an EndoGIA blue load. The proximal end easily reached the abdominal wall without tension. A 32mm port was then placed in the left abdomen, trans-rectus at the site of stoma marking. The proximal end of the divided sigmoid was again confirmed and visualized up to the splenic flexure. A locking grasper was placed on the staple line. Reinspection of the distal aspect of bowel and mesentery revealed a pink healthy end of colon and a hemostatic mesentery. The  skin around this trocar was incised for stoma preparation. The fascia  identified and cruciate incision made in the fascia. The posterior sheath was incised in a similar manner. The colon then was brought through the abdominal wall and easily reached, no tension and was pink. The correct orientation was again confirmed and care was taken under direct visualization to ensure the mesentery and colon was not twisted. The ports were removed under direct visualization and no bleeding was noted.. The fascia of the midline port and RLQ port was closed with 0 Vicryl suture. The skin of all port sites was closed with 4-0 monocryl suture and dressed with Dermabond. The stoma was draped out  And staple line excised. The stoma was then Brooked with 3-0 Vicryl suture. The lumen was widely patent well below the fascia. The case was then turned over to Dr. Diona Fanti for the [facility] tube placement. Please refer to his note for details. At conclusion, a stoma appliance was placed and the colon was pink.  Sharon Mt. Dema Severin, M.D. [facility]  Note: This dictation was prepared with Dragon/digital dictation along with [employer]. Any transcriptional errors that result from this process are unintentional.

## 2017-04-24 NOTE — Progress Notes (Signed)
Subjective No acute events. Doing well this morning - no complaints. Ready for surgery.  Objective: Vital signs in last 24 hours: Temp:  [98.5 F (36.9 C)-99.9 F (37.7 C)] 99.9 F (37.7 C) (10/18 0439) Pulse Rate:  [86-99] 93 (10/18 0439) Resp:  [16] 16 (10/18 0439) BP: (103-106)/(59-66) 103/59 (10/18 0439) SpO2:  [96 %-100 %] 96 % (10/18 0439) Weight:  [80.4 kg (177 lb 3.2 oz)-82.1 kg (181 lb 1.6 oz)] 80.4 kg (177 lb 3.2 oz) (10/18 0500) Last BM Date: 04/23/17  Intake/Output from previous day: 10/17 0701 - 10/18 0700 In: 1946.7 [P.O.:540; I.V.:750; IV Piggyback:656.7] Out: 2400 [Urine:2200; Stool:200] Intake/Output this shift: No intake/output data recorded.  Gen: NAD, comfortable CV: RRR Pulm: Normal work of breathing Abd: Soft, NT/ND Ext: SCDs in place  Lab Results: CBC   Recent Labs  04/22/17 0255 04/23/17 0356  WBC 15.4* 17.7*  HGB 7.9* 7.6*  HCT 24.1* 23.4*  PLT 332 289   BMET  Recent Labs  04/22/17 0255 04/23/17 0356  NA 144 137  K 2.9* 4.2  CL 120* 116*  CO2 14* 13*  GLUCOSE 111* 90  BUN 76* 56*  CREATININE 1.52* 1.25*  CALCIUM 7.8* 7.7*   PT/INR  Recent Labs  04/23/17 0356  LABPROT 15.4*  INR 1.23    Studies/Results:  Anti-infectives: Anti-infectives    Start     Dose/Rate Route Frequency Ordered Stop   04/23/17 2200  [MAR Hold]  piperacillin-tazobactam (ZOSYN) IVPB 3.375 g     (MAR Hold since 04/24/17 0631)   3.375 g 12.5 mL/hr over 240 Minutes Intravenous Every 8 hours 04/23/17 2033     04/23/17 1600  [MAR Hold]  vancomycin (VANCOCIN) IVPB 750 mg/150 ml premix     (MAR Hold since 04/24/17 0631)   750 mg 150 mL/hr over 60 Minutes Intravenous Every 24 hours 04/23/17 1404     04/23/17 1330  vancomycin (VANCOCIN) IVPB 1000 mg/200 mL premix  Status:  Discontinued     1,000 mg 200 mL/hr over 60 Minutes Intravenous Every 24 hours 04/23/17 0801 04/23/17 1404   04/23/17 1015  cefoTEtan (CEFOTAN) 2 g in dextrose 5 % 50 mL IVPB  Status:   Discontinued     2 g 100 mL/hr over 30 Minutes Intravenous On call to O.R. 04/23/17 1012 04/23/17 1234   04/22/17 1200  vancomycin (VANCOCIN) IVPB 1000 mg/200 mL premix  Status:  Discontinued     1,000 mg 200 mL/hr over 60 Minutes Intravenous Every 24 hours 04/22/17 0800 04/23/17 0801   04/21/17 2200  meropenem (MERREM) 1 g in sodium chloride 0.9 % 100 mL IVPB  Status:  Discontinued     1 g 200 mL/hr over 30 Minutes Intravenous Every 12 hours 04/21/17 1535 04/23/17 2029   04/21/17 0800  vancomycin (VANCOCIN) IVPB 1000 mg/200 mL premix  Status:  Discontinued     1,000 mg 200 mL/hr over 60 Minutes Intravenous Every 48 hours 04/19/17 1109 04/20/17 1100   04/20/17 1200  vancomycin (VANCOCIN) IVPB 750 mg/150 ml premix  Status:  Discontinued     750 mg 150 mL/hr over 60 Minutes Intravenous Every 24 hours 04/20/17 1100 04/22/17 0800   04/19/17 1000  meropenem (MERREM) 500 mg in sodium chloride 0.9 % 50 mL IVPB  Status:  Discontinued     500 mg 100 mL/hr over 30 Minutes Intravenous Every 12 hours 04/19/17 0612 04/21/17 1535   04/19/17 0800  vancomycin (VANCOCIN) 500 mg in sodium chloride 0.9 % 100 mL IVPB  Status:  Discontinued     500 mg 100 mL/hr over 60 Minutes Intravenous Every 48 hours 04/19/17 0612 04/19/17 1109   04/18/17 2215  vancomycin (VANCOCIN) IVPB 1000 mg/200 mL premix     1,000 mg 200 mL/hr over 60 Minutes Intravenous  Once 04/18/17 2209 04/18/17 2317   04/18/17 2215  meropenem (MERREM) 1 g in sodium chloride 0.9 % 100 mL IVPB     1 g 200 mL/hr over 30 Minutes Intravenous  Once 04/18/17 2209 04/18/17 2330       Assessment/Plan: Patient Active Problem List   Diagnosis Date Noted  . Leukocytosis   . Diarrhea in adult patient   . Heme positive stool   . Perineal abscess   . Recto-prostatic fistula   . MRSA bacteremia   . Staphylococcus aureus bacteremia with sepsis ([place])   . Severe sepsis with septic shock ([place])   . Acute renal failure ([place])   . Metabolic acidosis   .  Melena   . Altered mental status 04/18/2017  . 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 Normocytic anemia 05/16/2014  . Colon cancer screening 01/14/2014  . Lymphedema 02/25/2013  . Leg edema, right 05/04/2012  . Varicose veins of lower extremities with other complications 35/32/9924  . Hepatitis C 08/28/2011  . HYPERTENSION 01/10/2010  . NEOPLASM, MALIGNANT, PROSTATE, HX OF 01/10/2010  . DIABETES MELLITUS, TYPE II 01/04/2010  . 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 He has elected to proceed with surgery. Diagrams have been used to illustrate the relevant anatomy, physiology and pathophysiology. We discussed that the colostomy and SP tube would be permanent and serve to hopefully prevent/reduce episodes of future UTIs   Additionally, this has been discussed with his son, Colin [name], as well and he expressed understanding.   LOS: 6 days   Sharon Mt. Dema Severin, M.D. General and Colorectal Surgery [facility]

## 2017-04-24 NOTE — Op Note (Signed)
Preoperative diagnosis: Rectourethral fistula with perineal abscess.  Postoperative diagnosis: Same.  Principal procedure: Drainage of perineal abscess, placement of open suprapubic tube, 22 French Foley.  Surgeon: Diona Fanti  First Asst.: Nadeen Landau, M.D.  Anesthesia: Gen. endotracheal  Complications: None.  Specimen: None.  Estimated blood loss: Less than 50 mL  Indications: 76 year old male with newly diagnosed rectourethral fistula.  He has a history of prostate cancer and is status post external beam radiotherapy as well as salvage cryoablation (this was performed in 2017).  The patient was recently admitted with sepsis and evaluation revealed a significant rectourethral fistula.  The patient has been on IV antibiotic management as well as catheter drainage of his bladder.  He presents at this time for placement of a diverting suprapubic tube as well as a diverting colostomy for management of this fistula.  I discussed the procedure with the patient who, over the past 2 days has been fairly lucid, and I think understanding of this procedure.  Description of procedure: The patient underwent laparoscopic creation of an end colostomy by Dr. Dema Severin.  That procedure will be dictated separately.  Following this procedure, as well as creation of the colostomy, the upper abdomen was excluded from the lower abdominal surgical field.  After an appropriate timeout, a Pfannenstiel incision was created in the lower abdomen, approximate 10 centimeters in length.  This was carried down to the rectus fascia with electrocautery.  The fascia was divided along the length of the incision, and the fascia was dissected off the rectus superiorly.  The midlineof the rectus was found, and the bellies of the rectus were retracted laterally.  Access was then gained to the space of Retz[place] we stayed out of the peritoneal cavity, although it was evident that , there was still a small pneumoperitoneum, which helped  [place] identify the peritoneum.  By using the urethral catheter already in place, the bladder was filled with approximate 200 milliliters of saline, allowing [place] to identify the thin-walled bladder.  The bladder was grasped anteriorly near the dome with bowel for clamps.  2 separate pursestring sutures were placed, one outside the other, using 2-0 Vicryl in the anterior superior bladder.  The cystotomy was then made within these pursestring sutures.  At this point, it was evident that we were in the bladder.  Through a separate puncture incision superior to the Pfannenstiel incision, and to the right, a 50 [place] Foley catheter was brought through the skin, subcutaneous and muscular layers, and into the operative field.  It was then placed within the pursestring sutures, which were then cinched and tied. This created a fairly watertight closure around the suprapubic tube.  The balloon was filled with 20 mL of fluid. This was allowed to drain.  The sutures were then brought through the posterior rectus muscle, thus approximating the dome of the bladder to the posterior rectus area. A drain stitch, #1 silk, was then used to suture the catheter to the skin.  At this point, the rectus fascia was reapproximated using interrupted sutures of 2-0 Vicryl.  The wound was then irrigated with saline, and skin closure was performed using staples.  A dry sterile dressing was placed.    Prior to the laparoscopic part of the procedure, after careful physical exam revealed scrotal swelling and a left-sided perineal fluctuant area, a finger was used to pierce the skin, and a significant amount of purulent matter came out of this site.  Loculations were broken up with a gloved finger.  At  the end of the procedure, this area was dressed with gauze.  At this point, the patient was then awakened and taken to the PACU in stable condition.  He tolerated the procedure well.

## 2017-04-24 NOTE — Transfer of Care (Signed)
Immediate Anesthesia Transfer of Care Note  Patient: Colin [name]  Procedure(s) Performed: LAPAROSCOPIC SIGMOID COLOSTOMY (N/A Abdomen) OPEN INSERTION OF SUPRAPUBIC CATHETER (N/A Abdomen) INCISION AND DRAINAGE OF PERINEUM ABCESS (Perineum)  Patient Location: PACU  Anesthesia Type:General  Level of Consciousness: awake and alert   Airway & Oxygen Therapy: Patient Spontanous Breathing and Patient connected to face mask oxygen  Post-op Assessment: Report given to RN and Post -op Vital signs reviewed and stable  Post vital signs: Reviewed and stable  Last Vitals:  Vitals:   04/23/17 2135 04/24/17 0439  BP: 103/62 (!) 103/59  Pulse: 99 93  Resp: 16 16  Temp: 37.6 C 37.7 C  SpO2: 99% 96%    Last Pain:  Vitals:   04/24/17 0439  TempSrc: Oral  PainSc:          Complications: No apparent anesthesia complications

## 2017-04-24 NOTE — Progress Notes (Addendum)
Colin [name]  HQI:[id number] DOB: [birth year] DOA: 04/18/2017 PCP: Elfredia Nevins, MD    Brief Narrative:  76yo male with a Hx of stage 3 CKD, ETOHism, Hepatitis C, HTN, Anemia, Prostate Cancer, and DM2 who presented to [facility] on 10/12 with "black" stool for one month which had progressively worsened over 3 days. Hemoglobin 8.8, WBC 41.2. C.Diff negative, CT A/P with apparent fistula between the prostate gland / prostatic urethra and the anterior wall of the rectum. Foley was placed with black stool output noted. Patient was transferred to [facility] for further management. 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   Significant Events: 10/12 admit through [facility] ED  Subjective: Patient was in the OR all morning. Seen him postoperatively this afternoon. Denies complaints. Specifically denies pain. No dyspnea or chest pain reported.  Assessment & Plan:  Rectourethral fistula General surgery and urology consulted and patient underwent drainage of perineal abscess, placement of open suprapubic tube and laparoscopic creation of an end sigmoid colostomy 10/18.  Periurethral abscess Status post surgical drainage in OR 10/18.  GI Bleed - Acute blood loss anemia  Hemoglobin stable in the mid 7 g range. Follow CBCs closely and transfuse if hemoglobin is 7 or less. S/P 1 PRBC at surgery on 10/18. Post Hb 8.5  MRSA bacteremia  ID directing care of this issue . As per ID, continue broad-spectrum antibiotics until surgery and then will narrow thereafter.  Severe hypokalemia  likely due to signif GI losses due to above. Replaced. Follow BMP closely and replace as needed.  Non-anion gap metabolic acidosis Due to renal failure as well as possible GI bicarb losses. Started oral sodium bicarbonate tablets 10/17, likely not enough time to act. Follow BMP closely.   Uremia  Resolved  Acute Delirium  Likely  due to uremia / toxic met enceph. Resolved.  Hypernatremia  Reflective of free water deficit - corrected w/ free water resuscitation    Elevated Troponin in setting of demand ischemia  Troponin never signif elevated   HTN BP currently well controlled   AKI on CKD Stage 3 baseline Crt 1.4-1.8 - crt now normalized.  Liver Cirrhosis - Chronic Hep C   ?Cellulitis to Right LE  No evidence on exam today to suggest an active infection - B LE venous duplex w/o DVT on 04/19/17. Stable without change.  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 Replace phos and follow.  DVT prophylaxis: SCDs Code Status: FULL CODE Family Communication: no family present at time of exam  Disposition Plan: SNF when medically stable for discharge.   Consultants:  PCCM Gen Surgery Urology  Nephrology  ID  Antimicrobials:  Vancomycin 10/12 > Meropenem 10/12 >10/17 Zosyn 10/17>   Objective: Vitals:   04/24/17 1240 04/24/17 1245 04/24/17 1250 04/24/17 1310  BP:  106/64 109/68 100/60  Pulse: 65 66 68 70  Resp: 13 13 15 17   Temp:   (!) 97 F (36.1 C) 98.4 F (36.9 C)  TempSrc:    Oral  SpO2: 100% 100% 100% 100%  Weight:  Height:          Intake/Output Summary (Last 24 hours) at 04/24/17 1727 Last data filed at 04/24/17 1310  Gross per 24 hour  Intake          3951.67 ml  Output             2230 ml  Net          1721.67 ml   Filed Weights   04/22/17 2055 04/23/17 2135 04/24/17 0500  Weight: 75.8 kg (167 lb) 82.1 kg (181 lb 1.6 oz) 80.4 kg (177 lb 3.2 oz)    Examination: General: Elderly male, moderately built and nourished, lying comfortably supine in  bed Lungs: Clear to auscultation. No increased work of breathing. Stable. On room air. Cardiovascular: S1 and S2 heard, RRR. No JVD, murmurs or pedal edema. Tele: Personally reviewed: SR. Abdomen: Nondistended, soft and nontender. Normal bowel sounds heard. Newly placed suprapubic catheter, postop dressing mildly bloodstained.LLQ diverting end sigmoid colostomy with liquidgreen stools. Extremities: Moving all limbs symmetrically. No cyanosis clubbing or edema.stable CNS: Alert and oriented. No focal neurological deficits.stable  CBC:  Recent Labs Lab 04/18/17 1909  04/19/17 1929 04/20/17 0531 04/21/17 0241 04/22/17 0255 04/23/17 0356 04/24/17 1408  WBC 41.2*  < > 21.2* 20.5* 18.9* 15.4* 17.7*  --   NEUTROABS 39.3*  --   --   --   --   --   --   --   HGB 8.8*  < > 7.8* 8.3* 7.6* 7.9* 7.6* 8.5*  HCT 26.3*  < > 23.2* 25.1* 22.6* 24.1* 23.4* 27.0*  MCV 82.7  < > 79.2 79.4 79.0 81.4 83.3  --   PLT 404*  < > 369 379 361 332 289  --   < > = values in this interval not displayed. Basic Metabolic Panel:  Recent Labs Lab 04/19/17 0342  04/20/17 0531 04/21/17 0241 04/22/17 0255 04/23/17 0356 04/24/17 0614  NA 141  < > 143 148* 144 137 135  K 2.8*  < > 2.8* 2.5* 2.9* 4.2 4.1  CL 114*  < > 116* 125* 120* 116* 114*  CO2 8*  < > 14* 14* 14* 13* 13*  GLUCOSE 120*  < > 123* 121* 111* 90 102*  BUN 136*  < > 116* 104* 76* 56* 37*  CREATININE 3.71*  < > 2.41* 1.85* 1.52* 1.25* 1.19  CALCIUM 8.5*  < > 8.2* 8.1* 7.8* 7.7* 7.3*  MG 2.6*  --  2.1 2.1  --  1.6* 1.9  PHOS 4.4  --  2.3* 1.7*  --  <1.0* 2.0*  < > = values in this interval not displayed. GFR: Estimated Creatinine Clearance: 53.4 mL/min (by C-G formula based on SCr of 1.19 mg/dL).  Liver Function Tests:  Recent Labs Lab 04/18/17 1909 04/22/17 0255 04/23/17 0356  AST 50* 33 40  ALT 28 22 20   ALKPHOS 223* 214* 210*  BILITOT 0.7 0.7 0.7  PROT 7.7 5.5* 5.3*  ALBUMIN 2.7* 1.9* 1.8*    Recent Labs Lab 04/18/17 1909  04/21/17 1151  AMMONIA 92* 20    Coagulation Profile:  Recent Labs Lab 04/18/17 1909 04/23/17 0356  INR 1.26 1.23    Cardiac Enzymes:  Recent Labs Lab 04/18/17 1909 04/19/17 0443 04/19/17 1441  TROPONINI 0.04* 0.05* 0.04*     CBG:  Recent Labs Lab 04/21/17 1126 04/21/17 1548 04/21/17 2006 04/21/17 2326 04/24/17 1204  GLUCAP 111* 137* 130* 115* 154*    Recent Results (from the past 240  hour(s))  Blood Culture (routine x 2)     Status: Abnormal   Collection Time: 04/18/17  7:09 PM  Result Value Ref Range Status   Specimen Description RIGHT ANTECUBITAL  Final   Special Requests   Final    BOTTLES DRAWN AEROBIC AND ANAEROBIC Blood Culture adequate volume   Culture  Setup Time   Final    GRAM POSITIVE COCCI AEROBIC BOTTLE ONLY Gram Stain Report Called to,Read Back By and Verified With: EVERETT,R @1604  BY MATTHEWS, B 10.13.18 Performed at [facility] CRITICAL RESULT CALLED TO, READ BACK BY AND VERIFIED WITH: Verna Czech [id number] MLM Performed at [facility], [address], [place], [place] [address]    Culture METHICILLIN RESISTANT STAPHYLOCOCCUS AUREUS (A)  Final   Report Status 04/21/2017 FINAL  Final   Organism ID, Bacteria METHICILLIN RESISTANT STAPHYLOCOCCUS AUREUS  Final      Susceptibility   Methicillin resistant staphylococcus aureus - MIC*    CIPROFLOXACIN >=8 RESISTANT Resistant     ERYTHROMYCIN >=8 RESISTANT Resistant     GENTAMICIN <=0.5 SENSITIVE Sensitive     OXACILLIN >=4 RESISTANT Resistant     TETRACYCLINE <=1 SENSITIVE Sensitive     VANCOMYCIN 1 SENSITIVE Sensitive     TRIMETH/SULFA <=10 SENSITIVE Sensitive     CLINDAMYCIN >=8 RESISTANT Resistant     RIFAMPIN <=0.5 SENSITIVE Sensitive     Inducible Clindamycin NEGATIVE Sensitive     * METHICILLIN RESISTANT STAPHYLOCOCCUS AUREUS  Blood Culture ID Panel (Reflexed)     Status: Abnormal   Collection Time: 04/18/17  7:09 PM  Result Value Ref Range Status    Enterococcus species NOT DETECTED NOT DETECTED Final   Listeria monocytogenes NOT DETECTED NOT DETECTED Final   Staphylococcus species DETECTED (A) NOT DETECTED Final    Comment: CRITICAL RESULT CALLED TO, READ BACK BY AND VERIFIED WITH: PHARMD R RUMBARGER [phone] MLM    Staphylococcus aureus DETECTED (A) NOT DETECTED Final    Comment: Methicillin (oxacillin)-resistant Staphylococcus aureus (MRSA). MRSA is predictably resistant to beta-lactam antibiotics (except ceftaroline). Preferred therapy is vancomycin unless clinically contraindicated. Patient requires contact precautions if  hospitalized. CRITICAL RESULT CALLED TO, READ BACK BY AND VERIFIED WITH: PHARMD R RUMBARGER [phone] MLM    Methicillin resistance DETECTED (A) NOT DETECTED Final    Comment: CRITICAL RESULT CALLED TO, READ BACK BY AND VERIFIED WITH: PHARMD R RUMBARGER [phone] MLM    Streptococcus species NOT DETECTED NOT DETECTED Final   Streptococcus agalactiae NOT DETECTED NOT DETECTED Final   Streptococcus pneumoniae NOT DETECTED NOT DETECTED Final   Streptococcus pyogenes NOT DETECTED NOT DETECTED Final   Acinetobacter baumannii NOT DETECTED NOT DETECTED Final   Enterobacteriaceae species NOT DETECTED NOT DETECTED Final   Enterobacter cloacae complex NOT DETECTED NOT DETECTED Final   Escherichia coli NOT DETECTED NOT DETECTED Final   Klebsiella oxytoca NOT DETECTED NOT DETECTED Final   Klebsiella pneumoniae NOT DETECTED NOT DETECTED Final   Proteus species NOT DETECTED NOT DETECTED Final   Serratia marcescens NOT DETECTED NOT DETECTED Final   Haemophilus influenzae NOT DETECTED NOT DETECTED Final   Neisseria meningitidis NOT DETECTED NOT DETECTED Final   Pseudomonas aeruginosa NOT DETECTED NOT DETECTED Final   Candida albicans NOT DETECTED NOT DETECTED Final   Candida glabrata NOT DETECTED NOT DETECTED Final   Candida krusei NOT DETECTED NOT DETECTED Final   Candida parapsilosis NOT DETECTED NOT DETECTED  Final   Candida tropicalis NOT DETECTED NOT DETECTED Final  Comment: Performed at [facility], [address], [place], [place] [address]  Gastrointestinal Panel by PCR , Stool     Status: None   Collection Time: 04/18/17  9:05 PM  Result Value Ref Range Status   Campylobacter species NOT DETECTED NOT DETECTED Final   Plesimonas shigelloides NOT DETECTED NOT DETECTED Final   Salmonella species NOT DETECTED NOT DETECTED Final   Yersinia enterocolitica NOT DETECTED NOT DETECTED Final   Vibrio species NOT DETECTED NOT DETECTED Final   Vibrio cholerae NOT DETECTED NOT DETECTED Final   Enteroaggregative E coli (EAEC) NOT DETECTED NOT DETECTED Final   Enteropathogenic E coli (EPEC) NOT DETECTED NOT DETECTED Final   Enterotoxigenic E coli (ETEC) NOT DETECTED NOT DETECTED Final   Shiga like toxin producing E coli (STEC) NOT DETECTED NOT DETECTED Final   Shigella/Enteroinvasive E coli (EIEC) NOT DETECTED NOT DETECTED Final   Cryptosporidium NOT DETECTED NOT DETECTED Final   Cyclospora cayetanensis NOT DETECTED NOT DETECTED Final   Entamoeba histolytica NOT DETECTED NOT DETECTED Final   Giardia lamblia NOT DETECTED NOT DETECTED Final   Adenovirus F40/41 NOT DETECTED NOT DETECTED Final   Astrovirus NOT DETECTED NOT DETECTED Final   Norovirus GI/GII NOT DETECTED NOT DETECTED Final   Rotavirus A NOT DETECTED NOT DETECTED Final   Sapovirus (I, II, IV, and V) NOT DETECTED NOT DETECTED Final  C difficile quick scan w PCR reflex     Status: None   Collection Time: 04/18/17  9:05 PM  Result Value Ref Range Status   C Diff antigen NEGATIVE NEGATIVE Final   C Diff toxin NEGATIVE NEGATIVE Final   C Diff interpretation No C. difficile detected.  Final    Comment: VALID  Blood Culture (routine x 2)     Status: None   Collection Time: 04/18/17 10:26 PM  Result Value Ref Range Status   Specimen Description RIGHT ANTECUBITAL  Final   Special Requests   Final    BOTTLES DRAWN AEROBIC ONLY Blood  Culture adequate volume   Culture NO GROWTH 5 DAYS  Final   Report Status 04/23/2017 FINAL  Final  MRSA PCR Screening     Status: None   Collection Time: 04/19/17  3:06 AM  Result Value Ref Range Status   MRSA by PCR NEGATIVE NEGATIVE Final    Comment:        The GeneXpert MRSA Assay (FDA approved for NASAL specimens only), is one component of a comprehensive MRSA colonization surveillance program. It is not intended to diagnose MRSA infection nor to guide or monitor treatment for MRSA infections.   Culture, blood (Routine X 2) w Reflex to ID Panel     Status: None (Preliminary result)   Collection Time: 04/20/17 11:35 AM  Result Value Ref Range Status   Specimen Description BLOOD BLOOD RIGHT HAND  Final   Special Requests   Final    BOTTLES DRAWN AEROBIC AND ANAEROBIC Blood Culture adequate volume   Culture NO GROWTH 4 DAYS  Final   Report Status PENDING  Incomplete  Culture, blood (Routine X 2) w Reflex to ID Panel     Status: None (Preliminary result)   Collection Time: 04/20/17 11:41 AM  Result Value Ref Range Status   Specimen Description BLOOD BLOOD LEFT HAND  Final   Special Requests IN PEDIATRIC BOTTLE Blood Culture adequate volume  Final   Culture NO GROWTH 4 DAYS  Final   Report Status PENDING  Incomplete     Scheduled Meds: .  chlorhexidine  15 mL Mouth Rinse BID  . Chlorhexidine Gluconate Cloth  6 each Topical Once  . mouth rinse  15 mL Mouth Rinse q12n4p  . sodium bicarbonate  1,300 mg Oral BID     LOS: 6 days   Colin Vanderveer, MD, FACP, FHM. [facility] Pager [phone]  If 7PM-7AM, please contact night-coverage [email or web address] Password [email or web address] 04/24/2017, 5:27 PM

## 2017-04-24 NOTE — Interval H&P Note (Signed)
History and Physical Interval Note:  04/24/2017 7:28 AM  Colin [name]  has presented today for surgery, with the diagnosis of Rectourethral fistula  The various methods of treatment have been discussed with the patient and family. After consideration of risks, benefits and other options for treatment, the patient has consented to  Procedure(s): LAPAROSCOPIC VS OPEN SIGMOID COLOSTOMY (N/A) OPEN INSERTION OF SUPRAPUBIC CATHETER (N/A) as a surgical intervention .  The patient's history has been reviewed, patient examined, no change in status, stable for surgery.  I have reviewed the patient's chart and labs.  Questions were answered to the patient's satisfaction.     Jorja Loa

## 2017-04-24 NOTE — Anesthesia Postprocedure Evaluation (Signed)
Anesthesia Post Note  Patient: Colin [name]  Procedure(s) Performed: LAPAROSCOPIC SIGMOID COLOSTOMY (N/A Abdomen) OPEN INSERTION OF SUPRAPUBIC CATHETER (N/A Abdomen) INCISION AND DRAINAGE OF PERINEUM ABCESS (Perineum)     Patient location during evaluation: PACU Anesthesia Type: General Level of consciousness: awake and alert and oriented Pain management: pain level controlled Vital Signs Assessment: post-procedure vital signs reviewed and stable Respiratory status: spontaneous breathing, nonlabored ventilation, respiratory function stable and patient connected to nasal cannula oxygen Cardiovascular status: blood pressure returned to baseline and stable Postop Assessment: no apparent nausea or vomiting Anesthetic complications: no    Last Vitals:  Vitals:   04/24/17 1220 04/24/17 1250  BP: 104/65   Pulse: 79   Resp: 15   Temp:  (!) 36.1 C  SpO2: 98%     Last Pain:  Vitals:   04/24/17 1220  TempSrc:   PainSc: Asleep                 Kiondra Caicedo A.

## 2017-04-24 NOTE — Consult Note (Signed)
[facility] CM Primary Care Navigator  04/24/2017  Colin [name] 30-Apr-1941 [id number]   Went to see patient in the room to identify possible discharge needs but staff reports that patient is off the unit in the OR for surgery at this time (laparoscopic sigmoid colostomy).   Will attempt to see patient at another time when he is available in the room.      Addendum (04/28/17):   Met with patientat the bedside to identify possible discharge needs. Patient was reported to have "black" stools for one month which progressively worsened. He was transferred to [facility] for further management resulting tothis admission/ surgery.  Patient endorses Dr[facility] Elfredia [facility]ins [facility]primary [facility] provider.   [facility] [place].   Patient states that he has been managing his medications at home straight out of the containers  Patient reports that he was driving prior to admission/ surgery. He mentioned that son Colin [name]) or ex-wife will be able to providetransportationto hisdoctors'appointments after discharge.  Patient reports living alone and was the caregiver for himself at home as stated. He verbalized that son or ex-wife (who will be moving in with him after discharge) will serve as his primary caregivers at home.  Anticipated discharge plan is SNF (skilled nursing facility) per therapy recommendation, when medically stable.  Patient voiced understanding to call primary care provider's officewhen he returns backhome,for a post discharge follow-up within a week or sooner if needs arise.Patient letter (with PCP's contact number) was provided as areminder and patient's RN was notified as well to reinforce with patient and son- when he comes in.  Discussed with patientregarding [facility] CM services available for health managementat home. Patient denies  any needs, issues or concerns at this time. Patientvoiced understanding to seekreferral from primary care provider to [facility] care management ifdeemed necessary and appropriatefor services in the future- when he returns back home.   For additional questions please contact:  Karin Golden A. Shavar Gorka, BSN, RN-BC [facility] PRIMARY CARE Navigator Cell: [phone]

## 2017-04-24 NOTE — Progress Notes (Signed)
Assesed for  PIV was unsuccessful with finding a suitable IV, nurse is requesting an order for a midline. [employer]

## 2017-04-24 NOTE — Anesthesia Procedure Notes (Signed)
Procedure Name: Intubation Date/Time: 04/24/2017 8:09 AM Performed by: Everlean Cherry A Pre-anesthesia Checklist: Patient identified, Emergency Drugs available, Suction available and Patient being monitored Patient Re-evaluated:Patient Re-evaluated prior to induction Oxygen Delivery Method: Circle system utilized Preoxygenation: Pre-oxygenation with 100% oxygen Induction Type: IV induction Ventilation: Mask ventilation without difficulty Laryngoscope Size: Mac and 3 Grade View: Grade I Tube type: Oral Tube size: 7.5 mm Number of attempts: 1 Airway Equipment and Method: Stylet Placement Confirmation: ETT inserted through vocal cords under direct vision,  positive ETCO2 and breath sounds checked- equal and bilateral Secured at: 23 cm Tube secured with: Tape Dental Injury: Teeth and Oropharynx as per pre-operative assessment

## 2017-04-25 ENCOUNTER — Encounter (HOSPITAL_COMMUNITY): Payer: Self-pay | Admitting: Surgery

## 2017-04-25 LAB — CBC
HEMATOCRIT: 25.2 % — AB (ref 39.0–52.0)
HEMOGLOBIN: 8.2 g/dL — AB (ref 13.0–17.0)
MCH: 27.3 pg (ref 26.0–34.0)
MCHC: 32.5 g/dL (ref 30.0–36.0)
MCV: 84 fL (ref 78.0–100.0)
Platelets: 212 10*3/uL (ref 150–400)
RBC: 3 MIL/uL — AB (ref 4.22–5.81)
RDW: 21.3 % — ABNORMAL HIGH (ref 11.5–15.5)
WBC: 18.2 10*3/uL — AB (ref 4.0–10.5)

## 2017-04-25 LAB — RENAL FUNCTION PANEL
ALBUMIN: 1.9 g/dL — AB (ref 3.5–5.0)
Anion gap: 10 (ref 5–15)
BUN: 29 mg/dL — AB (ref 6–20)
CO2: 15 mmol/L — ABNORMAL LOW (ref 22–32)
CREATININE: 1.27 mg/dL — AB (ref 0.61–1.24)
Calcium: 7.3 mg/dL — ABNORMAL LOW (ref 8.9–10.3)
Chloride: 112 mmol/L — ABNORMAL HIGH (ref 101–111)
GFR, EST NON AFRICAN AMERICAN: 54 mL/min — AB (ref >60)
Glucose, Bld: 147 mg/dL — ABNORMAL HIGH (ref 65–99)
PHOSPHORUS: 3.7 mg/dL (ref 2.5–4.6)
POTASSIUM: 3.9 mmol/L (ref 3.5–5.1)
Sodium: 137 mmol/L (ref 135–145)

## 2017-04-25 LAB — CULTURE, BLOOD (ROUTINE X 2)
Culture: NO GROWTH
Culture: NO GROWTH
SPECIAL REQUESTS: ADEQUATE
Special Requests: ADEQUATE

## 2017-04-25 MED ORDER — VITAMIN B-1 100 MG PO TABS
100.0000 mg | ORAL_TABLET | Freq: Every day | ORAL | Status: DC
  Administered 2017-04-25 – 2017-05-01 (×7): 100 mg via ORAL
  Filled 2017-04-25 (×7): qty 1

## 2017-04-25 MED ORDER — FOLIC ACID 1 MG PO TABS
1.0000 mg | ORAL_TABLET | Freq: Every day | ORAL | Status: DC
  Administered 2017-04-25 – 2017-05-01 (×7): 1 mg via ORAL
  Filled 2017-04-25 (×7): qty 1

## 2017-04-25 MED ORDER — SODIUM CHLORIDE 0.45 % IV SOLN
INTRAVENOUS | Status: AC
  Administered 2017-04-25: 19:00:00 via INTRAVENOUS
  Filled 2017-04-25 (×2): qty 1000

## 2017-04-25 MED ORDER — BOOST / RESOURCE BREEZE PO LIQD
1.0000 | Freq: Three times a day (TID) | ORAL | Status: DC
  Administered 2017-04-25 – 2017-04-28 (×8): 1 via ORAL

## 2017-04-25 NOTE — Progress Notes (Signed)
[facility] for Infectious Disease    Date of Admission:  04/18/2017   Total days of antibiotics 8           ID: Colin [name] is a 76 y.o. male with chronic hep c, prostate ca, DM2 admitted for worsening melenic stools found to have WBC of 41K, and abdominal Ct showing prostatic urethral-rectal fistula. Infectious work up revealed MRSA bacteremia in 1 of 4 cx. On 10/12 Active Problems:   Altered mental status   Acute renal failure (HCC)   Metabolic acidosis   Melena   Diarrhea in adult patient   Heme positive stool   Perineal abscess   Recto-prostatic fistula   MRSA bacteremia   Staphylococcus aureus bacteremia with sepsis (HCC)   Severe sepsis with septic shock (HCC)   Leukocytosis    Subjective: Afebrile. Alert. Does not appear to be in significant pain since his surgery. Has brown liquid stool in his ostomy  Medications:  . Chlorhexidine Gluconate Cloth  6 each Topical Once  . feeding supplement  1 Container Oral TID BM  . sodium bicarbonate  1,300 mg Oral BID    Objective: Vital signs in last 24 hours: Temp:  [99 F (37.2 C)] 99 F (37.2 C) (10/19 0348) Pulse Rate:  [65-71] 71 (10/19 1520) Resp:  [16-18] 18 (10/19 1520) BP: (102-113)/(62-64) 113/64 (10/19 1520) SpO2:  [98 %-100 %] 100 % (10/19 1520) Weight:  [187 lb (84.8 kg)] 187 lb (84.8 kg) (10/19 0700)  Physical Exam  Constitutional: He is oriented to person, place, and time. He appears well-developed and well-nourished. No distress.  HENT:  Mouth/Throat: Oropharynx is clear and moist. No oropharyngeal exudate.  Cardiovascular: Normal rate, regular rhythm and normal heart sounds. Exam reveals no gallop and no friction rub.  No murmur heard.  Pulmonary/Chest: Effort normal and breath sounds normal. No respiratory distress. He has no wheezes.  Abdominal: Soft. Bowel sounds are normal. He exhibits no distension. Ostomy in lower left quadrant and drain to the right Lymphadenopathy:  He has no cervical  adenopathy.  Neurological: He is alert and oriented to person, place, and time.  Skin: Skin is warm and dry. No rash noted. No erythema.  Psychiatric: He has a normal mood and affect. His behavior is normal.     Lab Results  Recent Labs  04/23/17 0356 04/24/17 0614 04/24/17 1408 04/25/17 0433  WBC 17.7*  --   --  18.2*  HGB 7.6*  --  8.5* 8.2*  HCT 23.4*  --  27.0* 25.2*  NA 137 135  --  137  K 4.2 4.1  --  3.9  CL 116* 114*  --  112*  CO2 13* 13*  --  15*  BUN 56* 37*  --  29*  CREATININE 1.25* 1.19  --  1.27*   Liver Panel  Recent Labs  04/23/17 0356 04/25/17 0433  PROT 5.3*  --   ALBUMIN 1.8* 1.9*  AST 40  --   ALT 20  --   ALKPHOS 210*  --   BILITOT 0.7  --     Assessment/Plan: 76 yo M with liver disease found to have Rectourethral fistula, and transient MRSA bacteremia  mrsa bacteremia =  plan to treat minimum of 14 days using 10/18 as day 1  rectouretheral fistula nad abscess = plan to treat for 14 days with piptazo using 10/18 as day 1  portossytemic -Encephalopathy = not sure how much etoh use, concern for thiamine/folate deficiencies associated  with alcoholism. Improved today  Baxter Flattery [facility]ious Diseases Cell: [phone] Pager: [phone]  04/25/2017, 4:25 PM

## 2017-04-25 NOTE — Progress Notes (Signed)
Subjective No acute events. Doing well. No complaints. Tolerating liquids without n/v. +Gas and stool from stoma  Objective: Vital signs in last 24 hours: Temp:  [97 F (36.1 C)-99 F (37.2 C)] 99 F (37.2 C) (10/19 0348) Pulse Rate:  [58-88] 68 (10/19 0348) Resp:  [12-18] 18 (10/19 0348) BP: (94-114)/(59-71) 104/63 (10/19 0348) SpO2:  [91 %-100 %] 98 % (10/19 0348) Last BM Date: 04/25/17  Intake/Output from previous day: 10/18 0701 - 10/19 0700 In: 2655 [P.O.:180; I.V.:1410; Blood:315; IV Piggyback:600] Out: 1430 [Urine:1300; Stool:100; Blood:30] Intake/Output this shift: No intake/output data recorded.  Gen: NAD, comfortable CV: RRR Pulm: Normal work of breathing Abd: Soft, NT/ND; stoma pink, productive of gas and stool Ext: SCDs in place  Lab Results: CBC   Recent Labs  04/23/17 0356 04/24/17 1408 04/25/17 0433  WBC 17.7*  --  18.2*  HGB 7.6* 8.5* 8.2*  HCT 23.4* 27.0* 25.2*  PLT 289  --  212   BMET  Recent Labs  04/24/17 0614 04/25/17 0433  NA 135 137  K 4.1 3.9  CL 114* 112*  CO2 13* 15*  GLUCOSE 102* 147*  BUN 37* 29*  CREATININE 1.19 1.27*  CALCIUM 7.3* 7.3*   PT/INR  Recent Labs  04/23/17 0356  LABPROT 15.4*  INR 1.23   ABG No results for input(s): PHART, HCO3 in the last 72 hours.  Invalid input(s): PCO2, PO2  Studies/Results:  Anti-infectives: Anti-infectives    Start     Dose/Rate Route Frequency Ordered Stop   04/23/17 2200  piperacillin-tazobactam (ZOSYN) IVPB 3.375 g     3.375 g 12.5 mL/hr over 240 Minutes Intravenous Every 8 hours 04/23/17 2033     04/23/17 1600  vancomycin (VANCOCIN) IVPB 750 mg/150 ml premix     750 mg 150 mL/hr over 60 Minutes Intravenous Every 24 hours 04/23/17 1404     04/23/17 1330  vancomycin (VANCOCIN) IVPB 1000 mg/200 mL premix  Status:  Discontinued     1,000 mg 200 mL/hr over 60 Minutes Intravenous Every 24 hours 04/23/17 0801 04/23/17 1404   04/23/17 1015  cefoTEtan (CEFOTAN) 2 g in dextrose  5 % 50 mL IVPB  Status:  Discontinued     2 g 100 mL/hr over 30 Minutes Intravenous On call to O.R. 04/23/17 1012 04/23/17 1234   04/22/17 1200  vancomycin (VANCOCIN) IVPB 1000 mg/200 mL premix  Status:  Discontinued     1,000 mg 200 mL/hr over 60 Minutes Intravenous Every 24 hours 04/22/17 0800 04/23/17 0801   04/21/17 2200  meropenem (MERREM) 1 g in sodium chloride 0.9 % 100 mL IVPB  Status:  Discontinued     1 g 200 mL/hr over 30 Minutes Intravenous Every 12 hours 04/21/17 1535 04/23/17 2029   04/21/17 0800  vancomycin (VANCOCIN) IVPB 1000 mg/200 mL premix  Status:  Discontinued     1,000 mg 200 mL/hr over 60 Minutes Intravenous Every 48 hours 04/19/17 1109 04/20/17 1100   04/20/17 1200  vancomycin (VANCOCIN) IVPB 750 mg/150 ml premix  Status:  Discontinued     750 mg 150 mL/hr over 60 Minutes Intravenous Every 24 hours 04/20/17 1100 04/22/17 0800   04/19/17 1000  meropenem (MERREM) 500 mg in sodium chloride 0.9 % 50 mL IVPB  Status:  Discontinued     500 mg 100 mL/hr over 30 Minutes Intravenous Every 12 hours 04/19/17 0612 04/21/17 1535   04/19/17 0800  vancomycin (VANCOCIN) 500 mg in sodium chloride 0.9 % 100 mL IVPB  Status:  Discontinued     500 mg 100 mL/hr over 60 Minutes Intravenous Every 48 hours 04/19/17 0612 04/19/17 1109   04/18/17 2215  vancomycin (VANCOCIN) IVPB 1000 mg/200 mL premix     1,000 mg 200 mL/hr over 60 Minutes Intravenous  Once 04/18/17 2209 04/18/17 2317   04/18/17 2215  meropenem (MERREM) 1 g in sodium chloride 0.9 % 100 mL IVPB     1 g 200 mL/hr over 30 Minutes Intravenous  Once 04/18/17 2209 04/18/17 2330       Assessment/Plan: Patient Active Problem List   Diagnosis Date Noted  . Leukocytosis   . Diarrhea in adult patient   . Heme positive stool   . Perineal abscess   . Recto-prostatic fistula   . MRSA bacteremia   . Staphylococcus aureus bacteremia with sepsis ([place])   . Severe sepsis with septic shock ([place])   . Acute renal failure ([place])   .  Metabolic acidosis   . Melena   . Altered mental status 04/18/2017  . 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 Normocytic anemia 05/16/2014  . Colon cancer screening 01/14/2014  . Lymphedema 02/25/2013  . Leg edema, right 05/04/2012  . Varicose veins of lower extremities with other complications 15/11/6977  . Hepatitis C 08/28/2011  . HYPERTENSION 01/10/2010  . NEOPLASM, MALIGNANT, PROSTATE, HX OF 01/10/2010  . DIABETES MELLITUS, TYPE II 01/04/2010  . 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 Dema Severin, M.D. General and Colorectal Surgery [facility]

## 2017-04-25 NOTE — Progress Notes (Signed)
Initial Nutrition Assessment  DOCUMENTATION CODES:   Not applicable  INTERVENTION:   -Boost Breeze po TID, each supplement provides 250 kcal and 9 grams of protein -If prolonged NPO/clear liquid status is anticipated, consider initiation of nutrition support  NUTRITION DIAGNOSIS:   Inadequate oral intake related to altered GI function as evidenced by  (NPO/clear liquid diet x 7 days).  GOAL:   Patient will meet greater than or equal to 90% of their needs  MONITOR:   PO intake, Supplement acceptance, Diet advancement, Weight trends, Labs, Skin, I & O's  REASON FOR ASSESSMENT:   NPO/Clear Liquid Diet    ASSESSMENT:   76 year old male presents to ED with black tarry stool. Found to have fistula. Transferred to [facility] management.   S/p Procedure(s)04/24/17: LAPAROSCOPIC VS OPEN SIGMOID COLOSTOMY (N/A) OPEN INSERTION OF SUPRAPUBIC CATHETER (N/A)   Pt admitted with urethra/rectal fistula.  Spoke with pt, who was consuming jello at time of visit. He reports he is tolerating clear liquid diet without difficulty. Pt shares that he generally has a good appetite and enjoys eating, consumes 3 meals per day. He denies any weight loss, which is confirmed by wt hx.   Nutrition-Focused physical exam completed. Findings are no fat depletion, no muscle depletion, and mild edema.   Discussed potential for diet advancement and importance of good meal and supplement intake to promote healing. Pt amenable to Boost Breeze supplement.  Labs reviewed: Phos: 2.0.   Diet Order:  Diet clear liquid Room service appropriate? Yes; Fluid consistency: Thin  Skin:   (closed abdominal incision)  Last BM:  04/24/17 (100 ml output via colostomy)  Height:   Ht Readings from Last 1 Encounters:  04/22/17 5\' 6"  (1.676 m)    Weight:   Wt Readings from Last 1 Encounters:  04/25/17 187 lb (84.8 kg)    Ideal Body Weight:  64.5 kg  BMI:  Body mass index is 30.18  kg/m.  Estimated Nutritional Needs:   Kcal:  1700-1900  Protein:  90-105 grams  Fluid:  >1.7 L  EDUCATION NEEDS:   Education needs addressed  Sarann Tregre A. Jimmye Norman, RD, LDN, CDE Pager: [phone] After hours Pager: [phone]

## 2017-04-25 NOTE — Progress Notes (Signed)
1 Day Post-Op Subjective: Patient reports that he is fairly comfortable  Objective: Vital signs in last 24 hours: Temp:  [97 F (36.1 C)-99 F (37.2 C)] 99 F (37.2 C) (10/19 0348) Pulse Rate:  [58-88] 68 (10/19 0348) Resp:  [12-18] 18 (10/19 0348) BP: (94-114)/(59-71) 104/63 (10/19 0348) SpO2:  [91 %-100 %] 98 % (10/19 0348)  Intake/Output from previous day: 10/18 0701 - 10/19 0700 In: 2655 [P.O.:180; I.V.:1410; Blood:315; IV Piggyback:600] Out: 1430 [Urine:1300; Stool:100; Blood:30] Intake/Output this shift: No intake/output data recorded.  Physical Exam:  Constitutional: Vital signs reviewed. WD WN in NAD   Eyes: PERRL, No scleral icterus.   Cardiovascular: RRR Pulmonary/Chest: Normal effort Abdominal: Soft. Non-tender, some stool in bag. SP dressing wet. Genitourinary:Moderate perineal abscess drainage   Lab Results:  Recent Labs  04/23/17 0356 04/24/17 1408 04/25/17 0433  HGB 7.6* 8.5* 8.2*  HCT 23.4* 27.0* 25.2*   BMET  Recent Labs  04/24/17 0614 04/25/17 0433  NA 135 137  K 4.1 3.9  CL 114* 112*  CO2 13* 15*  GLUCOSE 102* 147*  BUN 37* 29*  CREATININE 1.19 1.27*  CALCIUM 7.3* 7.3*    Recent Labs  04/23/17 0356  INR 1.23   No results for input(s): LABURIN in the last 72 hours. Results for orders placed or performed during the hospital encounter of 04/18/17  Blood Culture (routine x 2)     Status: Abnormal   Collection Time: 04/18/17  7:09 PM  Result Value Ref Range Status   Specimen Description RIGHT ANTECUBITAL  Final   Special Requests   Final    BOTTLES DRAWN AEROBIC AND ANAEROBIC Blood Culture adequate volume   Culture  Setup Time   Final    GRAM POSITIVE COCCI AEROBIC BOTTLE ONLY Gram Stain Report Called to,Read Back By and Verified With: EVERETT,R @1604  BY MATTHEWS, B 10.13.18 Performed at [facility], READ BACK BY AND VERIFIED WITH: Helyn Numbers RUMBARGER [id number] MLM Performed at [facility], [place] [address], [place],  [address]    Culture METHICILLIN RESISTANT STAPHYLOCOCCUS AUREUS (A)  Final   Report Status 04/21/2017 FINAL  Final   Organism ID, Bacteria METHICILLIN RESISTANT STAPHYLOCOCCUS AUREUS  Final      Susceptibility   Methicillin resistant staphylococcus aureus - MIC*    CIPROFLOXACIN >=8 RESISTANT Resistant     ERYTHROMYCIN >=8 RESISTANT Resistant     GENTAMICIN <=0.5 SENSITIVE Sensitive     OXACILLIN >=4 RESISTANT Resistant     TETRACYCLINE <=1 SENSITIVE Sensitive     VANCOMYCIN 1 SENSITIVE Sensitive     TRIMETH/SULFA <=10 SENSITIVE Sensitive     CLINDAMYCIN >=8 RESISTANT Resistant     RIFAMPIN <=0.5 SENSITIVE Sensitive     Inducible Clindamycin NEGATIVE Sensitive     * METHICILLIN RESISTANT STAPHYLOCOCCUS AUREUS  Blood Culture ID Panel (Reflexed)     Status: Abnormal   Collection Time: 04/18/17  7:09 PM  Result Value Ref Range Status   Enterococcus species NOT DETECTED NOT DETECTED Final   Listeria monocytogenes NOT DETECTED NOT DETECTED Final   Staphylococcus species DETECTED (A) NOT DETECTED Final    Comment: CRITICAL RESULT CALLED TO, READ BACK BY AND VERIFIED WITH: PHARMD R RUMBARGER [phone] MLM    Staphylococcus aureus DETECTED (A) NOT DETECTED Final    Comment: Methicillin (oxacillin)-resistant Staphylococcus aureus (MRSA). MRSA is predictably resistant to beta-lactam antibiotics (except ceftaroline). Preferred therapy is vancomycin unless clinically contraindicated. Patient requires contact precautions if  hospitalized. CRITICAL RESULT CALLED TO, READ BACK BY AND VERIFIED WITH: PHARMD R RUMBARGER [phone] MLM    Methicillin resistance DETECTED (A) NOT DETECTED Final    Comment: CRITICAL RESULT CALLED TO, READ BACK BY AND VERIFIED WITH: PHARMD R RUMBARGER [phone] MLM    Streptococcus species NOT DETECTED NOT DETECTED Final   Streptococcus agalactiae NOT DETECTED NOT DETECTED Final   Streptococcus pneumoniae NOT DETECTED  NOT DETECTED Final   Streptococcus pyogenes NOT DETECTED NOT DETECTED Final   Acinetobacter baumannii NOT DETECTED NOT DETECTED Final   Enterobacteriaceae species NOT DETECTED NOT DETECTED Final   Enterobacter cloacae complex NOT DETECTED NOT DETECTED Final   Escherichia coli NOT DETECTED NOT DETECTED Final   Klebsiella oxytoca NOT DETECTED NOT DETECTED Final   Klebsiella pneumoniae NOT DETECTED NOT DETECTED Final   Proteus species NOT DETECTED NOT DETECTED Final   Serratia marcescens NOT DETECTED NOT DETECTED Final   Haemophilus influenzae NOT DETECTED NOT DETECTED Final   Neisseria meningitidis NOT DETECTED NOT DETECTED Final   Pseudomonas aeruginosa NOT DETECTED NOT DETECTED Final   Candida albicans NOT DETECTED NOT DETECTED Final   Candida glabrata NOT DETECTED NOT DETECTED Final   Candida krusei NOT DETECTED NOT DETECTED Final   Candida parapsilosis NOT DETECTED NOT DETECTED Final   Candida tropicalis NOT DETECTED NOT DETECTED Final    Comment: Performed at [facility], [place]. [address] [place], [place] [address]  Gastrointestinal Panel by PCR , Stool     Status: None   Collection Time: 04/18/17  9:05 PM  Result Value Ref Range Status   Campylobacter species NOT DETECTED NOT DETECTED Final   Plesimonas shigelloides NOT DETECTED NOT DETECTED Final   Salmonella species NOT DETECTED NOT DETECTED Final   Yersinia enterocolitica NOT DETECTED NOT DETECTED Final   Vibrio species NOT DETECTED NOT DETECTED Final   Vibrio cholerae NOT DETECTED NOT DETECTED Final   Enteroaggregative E coli (EAEC) NOT DETECTED NOT DETECTED Final   Enteropathogenic E coli (EPEC) NOT DETECTED NOT DETECTED Final   Enterotoxigenic E coli (ETEC) NOT DETECTED NOT DETECTED Final   Shiga like toxin producing E coli (STEC) NOT DETECTED NOT DETECTED Final   Shigella/Enteroinvasive E coli (EIEC) NOT DETECTED NOT DETECTED Final   Cryptosporidium NOT DETECTED NOT DETECTED Final   Cyclospora cayetanensis NOT  DETECTED NOT DETECTED Final   Entamoeba histolytica NOT DETECTED NOT DETECTED Final   Giardia lamblia NOT DETECTED NOT DETECTED Final   Adenovirus F40/41 NOT DETECTED NOT DETECTED Final   Astrovirus NOT DETECTED NOT DETECTED Final   Norovirus GI/GII NOT DETECTED NOT DETECTED Final   Rotavirus A NOT DETECTED NOT DETECTED Final   Sapovirus (I, II, IV, and V) NOT DETECTED NOT DETECTED Final  C difficile quick scan w PCR reflex     Status: None   Collection Time: 04/18/17  9:05 PM  Result Value Ref Range Status   C Diff antigen NEGATIVE NEGATIVE Final   C Diff toxin NEGATIVE NEGATIVE Final   C Diff interpretation No C. difficile detected.  Final    Comment: VALID  Blood Culture (routine x 2)     Status: None   Collection Time: 04/18/17 10:26 PM  Result Value Ref Range Status   Specimen Description RIGHT ANTECUBITAL  Final   Special Requests   Final    BOTTLES DRAWN AEROBIC ONLY Blood Culture adequate volume   Culture NO GROWTH 5 DAYS  Final   Report Status 04/23/2017 FINAL  Final  MRSA  PCR Screening     Status: None   Collection Time: 04/19/17  3:06 AM  Result Value Ref Range Status   MRSA by PCR NEGATIVE NEGATIVE Final    Comment:        The GeneXpert MRSA Assay (FDA approved for NASAL specimens only), is one component of a comprehensive MRSA colonization surveillance program. It is not intended to diagnose MRSA infection nor to guide or monitor treatment for MRSA infections.   Culture, blood (Routine X 2) w Reflex to ID Panel     Status: None (Preliminary result)   Collection Time: 04/20/17 11:35 AM  Result Value Ref Range Status   Specimen Description BLOOD BLOOD RIGHT HAND  Final   Special Requests   Final    BOTTLES DRAWN AEROBIC AND ANAEROBIC Blood Culture adequate volume   Culture NO GROWTH 4 DAYS  Final   Report Status PENDING  Incomplete  Culture, blood (Routine X 2) w Reflex to ID Panel     Status: None (Preliminary result)   Collection Time: 04/20/17 11:41 AM   Result Value Ref Range Status   Specimen Description BLOOD BLOOD LEFT HAND  Final   Special Requests IN PEDIATRIC BOTTLE Blood Culture adequate volume  Final   Culture NO GROWTH 4 DAYS  Final   Report Status PENDING  Incomplete    Studies/Results: No results found.  Assessment/Plan:   POD 1 combined sp tube/diverting colostomy for rectourethral fistula w/ periurethral abscess drainage. Doing well  No specific GU recs at present. Cont dressing changes for perineal abscess  Will follow   LOS: 7 days   Franchot Gallo M 04/25/2017, 7:14 AM

## 2017-04-25 NOTE — NC FL2 (Signed)
[place] LEVEL OF CARE SCREENING TOOL     IDENTIFICATION  Patient Name: Colin [name] Birthdate: [birth year] Sex: male Admission Date (Current Location): 04/18/2017  [facility] and [place] Number:  [employer] and Address:  [facility], [place] [address], [place], [place] [address]      Provider Number: [id number]  Attending Physician Name and Address:  Modena Jansky, MD  Relative Name and Phone Number:       Current Level of Care: Hospital Recommended Level of Care: [place] Prior Approval Number:    Date Approved/Denied:   PASRR Number: [id number]  Discharge Plan: SNF    Current Diagnoses: Patient Active Problem List   Diagnosis Date Noted  . Leukocytosis   . Diarrhea in adult patient   . Heme positive stool   . Perineal abscess   . Recto-prostatic fistula   . MRSA bacteremia   . Staphylococcus aureus bacteremia with sepsis ([place])   . Severe sepsis with septic shock ([place])   . Acute renal failure ([place])   . Metabolic acidosis   . Melena   . Altered mental status 04/18/2017  . 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 Normocytic anemia 05/16/2014  . Colon cancer screening 01/14/2014  . Lymphedema 02/25/2013  . Leg edema, right 05/04/2012  . Varicose veins of lower extremities with other complications 47/42/5956  . Hepatitis C 08/28/2011  . HYPERTENSION 01/10/2010  . NEOPLASM, MALIGNANT, PROSTATE, HX OF 01/10/2010  . DIABETES MELLITUS, TYPE II 01/04/2010  . 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    Orientation RESPIRATION BLADDER Height & Weight     Place, Self, Situation  Normal Incontinent, Indwelling catheter Weight: 187 lb (84.8 kg) Height:  5\' 6"  (167.6 cm)  BEHAVIORAL SYMPTOMS/MOOD NEUROLOGICAL BOWEL NUTRITION STATUS      Incontinent, Colostomy Diet  AMBULATORY STATUS COMMUNICATION OF NEEDS Skin   Extensive Assist Verbally Normal                       Personal Care  Assistance Level of Assistance  Bathing, Dressing Bathing Assistance: Maximum assistance   Dressing Assistance: Maximum assistance     Functional Limitations Info             SPECIAL CARE FACTORS FREQUENCY  PT (By licensed PT), OT (By licensed OT)     PT Frequency: 5/wk OT Frequency: 5/wk            Contractures      Additional Factors Info  Code Status, Allergies Code Status Info: FULL Allergies Info: NKA           Current Medications (04/25/2017):  This is the current hospital active medication list Current Facility-Administered Medications  Medication Dose Route Frequency Provider Last Rate Last Dose  . Chlorhexidine Gluconate Cloth 2 % PADS 6 each  6 each Topical Once Meuth, Brooke A, PA-C      . HYDROmorphone (DILAUDID) injection 0.5 mg  0.5 mg Intravenous Q3H PRN Ileana Roup, MD   0.5 mg at 04/25/17 0350  . oxyCODONE (Oxy IR/ROXICODONE) immediate release tablet 5-10 mg  5-10 mg Oral Q6H PRN Ileana Roup, MD   10 mg at 04/25/17 1030  . piperacillin-tazobactam (ZOSYN) IVPB 3.375 g  3.375 g Intravenous Q8H Hongalgi, Lenis Dickinson, MD   Stopped at 04/25/17 1000  . sodium bicarbonate tablet 1,300 mg  1,300 mg Oral BID Vernell Leep D, MD   1,300 mg at 04/25/17 1030  .  sodium chloride flush (NS) 0.9 % injection 10-40 mL  10-40 mL Intracatheter PRN Modena Jansky, MD   10 mL at 04/25/17 0441  . vancomycin (VANCOCIN) IVPB 750 mg/150 ml premix  750 mg Intravenous Q24H Vernell Leep D, MD 150 mL/hr at 04/24/17 1715 750 mg at 04/24/17 1715     Discharge Medications: Please see discharge summary for a list of discharge medications.  Relevant Imaging Results:  Relevant Lab Results:   Additional Information SS#: [id number]  Jorge Ny, LCSW

## 2017-04-25 NOTE — Consult Note (Signed)
[place] Nurse ostomy consult note Stoma type/location: end colostomy Stomal assessment/size: 1 3/8" round, budded, pink, moist  Peristomal assessment: NA Treatment options for stomal/peristomal skin: pouch intact from OR, yesterday Output: + flatus and liquid green stool Ostomy pouching: 2pc 2 3/4', however I think a 1pc might be easiest for him.  He reports he will be going to a rehab facility at [place] and I agree that this would most likely benefit him for further teaching on his care of his ostomy as well.  Education provided:  Met with patient. Explained role of ostomy nurse. Reviewed creation of stoma, emptying and pouching routines.  Samples of pouching shown to patient today.  Enrolled patient in [place] program: No  Extra supplies left in the patients room  [place] nurse will visit with patient again on Monday for pouch change.   [place], [place], [place]

## 2017-04-25 NOTE — Progress Notes (Signed)
Colin [name]  UEA:[id number] DOB: February 20, 1941 DOA: 04/18/2017 PCP: Elfredia Nevins, MD    Brief Narrative:  76yo male with a Hx of stage 3 CKD, ETOHism, Hepatitis C, HTN, Anemia, Prostate Cancer, and DM2 who presented to [facility] ED on 10/12 with "black" stool for one month which had progressively worsened over 3 days. Hemoglobin 8.8, WBC 41.2. C.Diff negative, CT A/P with apparent fistula between the prostate gland / prostatic urethra and the anterior wall of the rectum. Foley was placed with black stool output noted. Patient was transferred to [facility] for further management. 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   Significant Events: 10/12 admit through [facility] ED  Subjective: Seen this morning and complained that his bottom was sore and he wanted to get out of the bed to the chair. Did not complain of abdominal pain. As per RN, no acute issues.  Assessment & Plan:  Rectourethral fistula General surgery and urology consulted and patient underwent combined suprapubic tube, diverting colostomy for rectourethral fistula with periurethral abscess drainage on 10/18. GI and surgery following.  Periurethral abscess Status post surgical drainage in OR 10/18. As per ID follow-up, plan to treat for 14 days with Zosyn using 10/18 as day 1. Left upper arm midline placed 10/17.  GI Bleed - Acute blood loss anemia  Hemoglobin stable in the mid 7 g range. Follow CBCs closely and transfuse if hemoglobin is 7 or less. S/P 1 PRBC at surgery on 10/18. Post Hb 8.5 >8.2  MRSA bacteremia  ID directing care of this issue . As per ID follow-up, plan to treat minimum of 14 days using 10/18 as day 1.  Severe hypokalemia  likely due to signif GI losses due to above. Replaced. Follow BMP closely and replace as needed.  Non-anion gap metabolic acidosis Due to renal failure as well as possible GI bicarb losses. Started  oral sodium bicarbonate tablets 10/17, likely not enough time to act. Follow BMP closely. Slowly improving.  Acute encephalopathy/? Portosystemic - As per chart review, not sure how much alcohol he uses. Concern for thiamine and folate deficiency, we'll replace. Improved.  Hypernatremia  Reflective of free water deficit - corrected w/ free water resuscitation    Elevated Troponin in setting of demand ischemia  Troponin never signif elevated   HTN BP currently well controlled   AKI on CKD Stage 3 baseline Crt 1.4-1.8. Creatinine has gone up slightly to 1.27. Brief hydration and follow BMP.  Liver Cirrhosis - Chronic Hep C   ?Cellulitis to Right LE  No evidence on exam today to suggest an active infection - B LE venous duplex w/o DVT on 04/19/17. Stable without change.  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   Consultants:  PCCM [facility] Surgery Urology  Nephrology  ID  Antimicrobials:  Vancomycin 10/12 > Meropenem 10/12 >10/17 Zosyn 10/17>   Objective: Vitals:   04/25/17 0348 04/25/17 0700 04/25/17 1520 04/25/17 1722  BP: 104/63  113/64 (!) 97/56  Pulse: 68  71 73  Resp: 18  18 18   Temp: 99 F (37.2 C)   98.9 F (37.2 C)  TempSrc: Oral   Oral  SpO2: 98%  100% 96%  Weight:  84.8 kg (187 lb)    Height:          Intake/Output Summary (Last 24 hours) at 04/25/17 1808 Last data filed at 04/25/17 1107  Gross per 24 hour  Intake              530 ml  Output             1550 ml  Net             -1020 ml   Filed Weights   04/23/17 2135 04/24/17 0500 04/25/17 0700  Weight: 82.1 kg (181 lb 1.6 oz) 80.4 kg (177 lb 3.2 oz) 84.8 kg (187 lb)    Examination: General: Elderly male, moderately built and nourished, lying comfortably propped up in bed. Lungs: Clear to auscultation. No increased work of breathing. Stable without change. Cardiovascular: S1 and S2 heard, RRR. No JVD, murmurs or pedal edema. Telemetry personally reviewed: Sinus rhythm, discontinued. Abdomen: Nondistended, soft and nontender. Normal bowel sounds heard. Newly placed suprapubic catheter, postop dressing clean and dry.LLQ diverting end sigmoid colostomy with liquidgreen stools. Extremities: Moving all limbs symmetrically. No cyanosis clubbing or edema.stable CNS: Alert and oriented. No focal neurological deficits.stable  CBC:  Recent Labs Lab 04/18/17 1909  04/20/17 0531 04/21/17 0241 04/22/17 0255 04/23/17 0356 04/24/17 1408 04/25/17 0433  WBC 41.2*  < > 20.5* 18.9* 15.4* 17.7*  --  18.2*  NEUTROABS 39.3*  --   --   --   --   --   --   --   HGB 8.8*  < > 8.3* 7.6* 7.9* 7.6* 8.5* 8.2*  HCT 26.3*  < > 25.1* 22.6* 24.1* 23.4* 27.0* 25.2*  MCV 82.7  < > 79.4 79.0 81.4 83.3  --  84.0  PLT 404*  < > 379 361 332 289  --  212  < > = values in this interval not displayed. Basic Metabolic Panel:  Recent Labs Lab 04/19/17 0342  04/20/17 0531 04/21/17 0241 04/22/17 0255 04/23/17 0356 04/24/17 0614 04/25/17 0433  NA 141  < > 143 148* 144 137 135 137  K 2.8*  < > 2.8* 2.5* 2.9* 4.2 4.1 3.9  CL 114*  < > 116* 125* 120* 116* 114* 112*  CO2 8*  < > 14* 14* 14* 13* 13* 15*  GLUCOSE 120*  < > 123* 121* 111* 90 102* 147*  BUN 136*  < > 116* 104* 76* 56* 37* 29*  CREATININE 3.71*  < > 2.41* 1.85* 1.52* 1.25* 1.19 1.27*  CALCIUM 8.5*  < > 8.2* 8.1* 7.8* 7.7* 7.3* 7.3*  MG 2.6*  --  2.1 2.1  --  1.6* 1.9  --   PHOS 4.4  --  2.3* 1.7*  --  <1.0* 2.0* 3.7  < > = values in this interval not  displayed. GFR: Estimated Creatinine Clearance: 51.3 mL/min (A) (by C-G formula based on SCr of 1.27 mg/dL (H)).  Liver Function Tests:  Recent Labs Lab 04/18/17 1909 04/22/17 0255 04/23/17 0356 04/25/17 0433  AST 50* 33 40  --   ALT 28 22 20   --   ALKPHOS 223* 214* 210*  --   BILITOT 0.7 0.7 0.7  --   PROT 7.7 5.5* 5.3*  --  ALBUMIN 2.7* 1.9* 1.8* 1.9*    Recent Labs Lab 04/18/17 1909 04/21/17 1151  AMMONIA 92* 20    Coagulation Profile:  Recent Labs Lab 04/18/17 1909 04/23/17 0356  INR 1.26 1.23    Cardiac Enzymes:  Recent Labs Lab 04/18/17 1909 04/19/17 0443 04/19/17 1441  TROPONINI 0.04* 0.05* 0.04*     CBG:  Recent Labs Lab 04/21/17 1126 04/21/17 1548 04/21/17 2006 04/21/17 2326 04/24/17 1204  GLUCAP 111* 137* 130* 115* 154*    Recent Results (from the past 240 hour(s))  Blood Culture (routine x 2)     Status: Abnormal   Collection Time: 04/18/17  7:09 PM  Result Value Ref Range Status   Specimen Description RIGHT ANTECUBITAL  Final   Special Requests   Final    BOTTLES DRAWN AEROBIC AND ANAEROBIC Blood Culture adequate volume   Culture  Setup Time   Final    GRAM POSITIVE COCCI AEROBIC BOTTLE ONLY Gram Stain Report Called to,Read Back By and Verified With: EVERETT,R @1604  BY MATTHEWS, B 10.13.18 Performed at [facility] CRITICAL RESULT CALLED TO, READ BACK BY AND VERIFIED WITH: Verna Czech [id number] MLM Performed at [facility], [address], [place], [place] [address]    Culture METHICILLIN RESISTANT STAPHYLOCOCCUS AUREUS (A)  Final   Report Status 04/21/2017 FINAL  Final   Organism ID, Bacteria METHICILLIN RESISTANT STAPHYLOCOCCUS AUREUS  Final      Susceptibility   Methicillin resistant staphylococcus aureus - MIC*    CIPROFLOXACIN >=8 RESISTANT Resistant     ERYTHROMYCIN >=8 RESISTANT Resistant     GENTAMICIN <=0.5 SENSITIVE Sensitive     OXACILLIN >=4 RESISTANT Resistant     TETRACYCLINE <=1  SENSITIVE Sensitive     VANCOMYCIN 1 SENSITIVE Sensitive     TRIMETH/SULFA <=10 SENSITIVE Sensitive     CLINDAMYCIN >=8 RESISTANT Resistant     RIFAMPIN <=0.5 SENSITIVE Sensitive     Inducible Clindamycin NEGATIVE Sensitive     * METHICILLIN RESISTANT STAPHYLOCOCCUS AUREUS  Blood Culture ID Panel (Reflexed)     Status: Abnormal   Collection Time: 04/18/17  7:09 PM  Result Value Ref Range Status   Enterococcus species NOT DETECTED NOT DETECTED Final   Listeria monocytogenes NOT DETECTED NOT DETECTED Final   Staphylococcus species DETECTED (A) NOT DETECTED Final    Comment: CRITICAL RESULT CALLED TO, READ BACK BY AND VERIFIED WITH: PHARMD R RUMBARGER [phone] MLM    Staphylococcus aureus DETECTED (A) NOT DETECTED Final    Comment: Methicillin (oxacillin)-resistant Staphylococcus aureus (MRSA). MRSA is predictably resistant to beta-lactam antibiotics (except ceftaroline). Preferred therapy is vancomycin unless clinically contraindicated. Patient requires contact precautions if  hospitalized. CRITICAL RESULT CALLED TO, READ BACK BY AND VERIFIED WITH: PHARMD R RUMBARGER [phone] MLM    Methicillin resistance DETECTED (A) NOT DETECTED Final    Comment: CRITICAL RESULT CALLED TO, READ BACK BY AND VERIFIED WITH: PHARMD R RUMBARGER [phone] MLM    Streptococcus species NOT DETECTED NOT DETECTED Final   Streptococcus agalactiae NOT DETECTED NOT DETECTED Final   Streptococcus pneumoniae NOT DETECTED NOT DETECTED Final   Streptococcus pyogenes NOT DETECTED NOT DETECTED Final   Acinetobacter baumannii NOT DETECTED NOT DETECTED Final   Enterobacteriaceae species NOT DETECTED NOT DETECTED Final   Enterobacter cloacae complex NOT DETECTED NOT DETECTED Final   Escherichia coli NOT DETECTED NOT DETECTED Final   Klebsiella oxytoca NOT DETECTED NOT DETECTED Final   Klebsiella pneumoniae NOT DETECTED NOT DETECTED Final  Proteus species NOT DETECTED NOT DETECTED Final   Serratia marcescens  NOT DETECTED NOT DETECTED Final   Haemophilus influenzae NOT DETECTED NOT DETECTED Final   Neisseria meningitidis NOT DETECTED NOT DETECTED Final   Pseudomonas aeruginosa NOT DETECTED NOT DETECTED Final   Candida albicans NOT DETECTED NOT DETECTED Final   Candida glabrata NOT DETECTED NOT DETECTED Final   Candida krusei NOT DETECTED NOT DETECTED Final   Candida parapsilosis NOT DETECTED NOT DETECTED Final   Candida tropicalis NOT DETECTED NOT DETECTED Final    Comment: Performed at [facility], [address] [place], [place] [address]  Gastrointestinal Panel by PCR , Stool     Status: None   Collection Time: 04/18/17  9:05 PM  Result Value Ref Range Status   Campylobacter species NOT DETECTED NOT DETECTED Final   Plesimonas shigelloides NOT DETECTED NOT DETECTED Final   Salmonella species NOT DETECTED NOT DETECTED Final   Yersinia enterocolitica NOT DETECTED NOT DETECTED Final   Vibrio species NOT DETECTED NOT DETECTED Final   Vibrio cholerae NOT DETECTED NOT DETECTED Final   Enteroaggregative E coli (EAEC) NOT DETECTED NOT DETECTED Final   Enteropathogenic E coli (EPEC) NOT DETECTED NOT DETECTED Final   Enterotoxigenic E coli (ETEC) NOT DETECTED NOT DETECTED Final   Shiga like toxin producing E coli (STEC) NOT DETECTED NOT DETECTED Final   Shigella/Enteroinvasive E coli (EIEC) NOT DETECTED NOT DETECTED Final   Cryptosporidium NOT DETECTED NOT DETECTED Final   Cyclospora cayetanensis NOT DETECTED NOT DETECTED Final   Entamoeba histolytica NOT DETECTED NOT DETECTED Final   Giardia lamblia NOT DETECTED NOT DETECTED Final   Adenovirus F40/41 NOT DETECTED NOT DETECTED Final   Astrovirus NOT DETECTED NOT DETECTED Final   Norovirus GI/GII NOT DETECTED NOT DETECTED Final   Rotavirus A NOT DETECTED NOT DETECTED Final   Sapovirus (I, II, IV, and V) NOT DETECTED NOT DETECTED Final  C difficile quick scan w PCR reflex     Status: None   Collection Time: 04/18/17  9:05 PM  Result  Value Ref Range Status   C Diff antigen NEGATIVE NEGATIVE Final   C Diff toxin NEGATIVE NEGATIVE Final   C Diff interpretation No C. difficile detected.  Final    Comment: VALID  Blood Culture (routine x 2)     Status: None   Collection Time: 04/18/17 10:26 PM  Result Value Ref Range Status   Specimen Description RIGHT ANTECUBITAL  Final   Special Requests   Final    BOTTLES DRAWN AEROBIC ONLY Blood Culture adequate volume   Culture NO GROWTH 5 DAYS  Final   Report Status 04/23/2017 FINAL  Final  MRSA PCR Screening     Status: None   Collection Time: 04/19/17  3:06 AM  Result Value Ref Range Status   MRSA by PCR NEGATIVE NEGATIVE Final    Comment:        The GeneXpert MRSA Assay (FDA approved for NASAL specimens only), is one component of a comprehensive MRSA colonization surveillance program. It is not intended to diagnose MRSA infection nor to guide or monitor treatment for MRSA infections.   Culture, blood (Routine X 2) w Reflex to ID Panel     Status: None   Collection Time: 04/20/17 11:35 AM  Result Value Ref Range Status   Specimen Description BLOOD BLOOD RIGHT HAND  Final   Special Requests   Final    BOTTLES DRAWN AEROBIC AND ANAEROBIC Blood Culture adequate volume   Culture  NO GROWTH 5 DAYS  Final   Report Status 04/25/2017 FINAL  Final  Culture, blood (Routine X 2) w Reflex to ID Panel     Status: None   Collection Time: 04/20/17 11:41 AM  Result Value Ref Range Status   Specimen Description BLOOD BLOOD LEFT HAND  Final   Special Requests IN PEDIATRIC BOTTLE Blood Culture adequate volume  Final   Culture NO GROWTH 5 DAYS  Final   Report Status 04/25/2017 FINAL  Final     Scheduled Meds: . Chlorhexidine Gluconate Cloth  6 each Topical Once  . feeding supplement  1 Container Oral TID BM  . sodium bicarbonate  1,300 mg Oral BID     LOS: 7 days   Annalysa Mohammad, MD, FACP, FHM. [facility] Pager [phone]  If 7PM-7AM, please contact  night-coverage [email or web address] Password [email or web address] 04/25/2017, 6:08 PM

## 2017-04-26 LAB — CBC
HCT: 24 % — ABNORMAL LOW (ref 39.0–52.0)
Hemoglobin: 7.9 g/dL — ABNORMAL LOW (ref 13.0–17.0)
MCH: 28.2 pg (ref 26.0–34.0)
MCHC: 32.9 g/dL (ref 30.0–36.0)
MCV: 85.7 fL (ref 78.0–100.0)
PLATELETS: 202 10*3/uL (ref 150–400)
RBC: 2.8 MIL/uL — AB (ref 4.22–5.81)
RDW: 21.8 % — ABNORMAL HIGH (ref 11.5–15.5)
WBC: 12.7 10*3/uL — ABNORMAL HIGH (ref 4.0–10.5)

## 2017-04-26 LAB — BASIC METABOLIC PANEL
Anion gap: 9 (ref 5–15)
BUN: 21 mg/dL — AB (ref 6–20)
CO2: 17 mmol/L — AB (ref 22–32)
Calcium: 7 mg/dL — ABNORMAL LOW (ref 8.9–10.3)
Chloride: 110 mmol/L (ref 101–111)
Creatinine, Ser: 1.25 mg/dL — ABNORMAL HIGH (ref 0.61–1.24)
GFR calc Af Amer: >60 mL/min (ref >60)
GFR, EST NON AFRICAN AMERICAN: 55 mL/min — AB (ref >60)
GLUCOSE: 92 mg/dL (ref 65–99)
POTASSIUM: 3.5 mmol/L (ref 3.5–5.1)
Sodium: 136 mmol/L (ref 135–145)

## 2017-04-26 LAB — VANCOMYCIN, TROUGH: Vancomycin Tr: 21 ug/mL (ref 15–20)

## 2017-04-26 MED ORDER — SODIUM CHLORIDE 0.45 % IV SOLN
INTRAVENOUS | Status: DC
  Administered 2017-04-26 – 2017-04-27 (×2): via INTRAVENOUS
  Filled 2017-04-26 (×2): qty 1000

## 2017-04-26 NOTE — Progress Notes (Signed)
Pharmacy Antibiotic Note  Colin [name] is a 76 y.o. male admitted on 04/18/2017 with urethral/rectal fistula.  Continues on Vancomycin for MRSA bacteremia Vancomycin trough this PM = 21 mcg / dL  Plan: Continue Vancomycin at 750 mg iv Q 24 hours as renal function slowly improving Continue to monitor  Height: 5\' 6"  (167.6 cm) Weight: 178 lb 6.4 oz (80.9 kg) IBW/kg (Calculated) : 63.8  Temp (24hrs), Avg:98.4 F (36.9 C), Min:98 F (36.7 C), Max:99 F (37.2 C)   Recent Labs Lab 04/21/17 0241 04/22/17 0255 04/23/17 0356 04/23/17 1247 04/24/17 0614 04/25/17 0433 04/26/17 0425 04/26/17 1518  WBC 18.9* 15.4* 17.7*  --   --  18.2* 12.7*  --   CREATININE 1.85* 1.52* 1.25*  --  1.19 1.27* 1.25*  --   VANCOTROUGH  --   --   --  22*  --   --   --  21*    Estimated Creatinine Clearance: 51 mL/min (A) (by C-G formula based on SCr of 1.25 mg/dL (H)).    No Known Allergies  Thank you for allowing pharmacy to be a part of this patient's care. Anette Guarneri, PharmD [phone]  04/26/2017 5:25 PM

## 2017-04-26 NOTE — Progress Notes (Signed)
Vancomycin Tr 21.  Pharmacy was notified. Per pharmacist dosing will continue as it is.

## 2017-04-26 NOTE — Progress Notes (Addendum)
2 Days Post-Op  Subjective: Sitting up in chair.  No respiratory problems.  Pain control fairly good. tolerating clear liquids Continues to pass some gas and stool per ostomy.  Afebrile.  Heart rate 90.  Normotensive.good urine output  Hemoglobin 7.9.  WBC down to 12.7.  Potassium 3.5.  Creatinine 1.25.  All stable  Objective: Vital signs in last 24 hours: Temp:  [98 F (36.7 C)-99 F (37.2 C)] 98 F (36.7 C) (10/20 0846) Pulse Rate:  [71-90] 90 (10/20 0846) Resp:  [18-19] 18 (10/20 0846) BP: (97-114)/(56-65) 106/64 (10/20 0846) SpO2:  [96 %-100 %] 98 % (10/20 0846) Weight:  [80.9 kg (178 lb 6.4 oz)] 80.9 kg (178 lb 6.4 oz) (10/20 0547) Last BM Date: 04/25/17  Intake/Output from previous day: 10/19 0701 - 10/20 0700 In: 1430 [P.O.:480; I.V.:600; IV Piggyback:250] Out: 1914 [Urine:3025; Stool:50] Intake/Output this shift: Total I/O In: -  Out: 400 [Urine:350; Stool:50]  General appearance: alert.  Sitting up in chair.  No distresscooperative Resp: clear to auscultation bilaterally GI: soft.  Minimally distended if at all.  Stoma pink.  Gas and stool in bag.  Wounds okay. Perineal wound drainage low-volume  Lab Results:  Results for orders placed or performed during the hospital encounter of 04/18/17 (from the past 24 hour(s))  CBC     Status: Abnormal   Collection Time: 04/26/17  4:25 AM  Result Value Ref Range   WBC 12.7 (H) 4.0 - 10.5 K/uL   RBC 2.80 (L) 4.22 - 5.81 MIL/uL   Hemoglobin 7.9 (L) 13.0 - 17.0 g/dL   HCT 24.0 (L) 39.0 - 52.0 %   MCV 85.7 78.0 - 100.0 fL   MCH 28.2 26.0 - 34.0 pg   MCHC 32.9 30.0 - 36.0 g/dL   RDW 21.8 (H) 11.5 - 15.5 %   Platelets 202 150 - 400 K/uL  Basic metabolic panel     Status: Abnormal   Collection Time: 04/26/17  4:25 AM  Result Value Ref Range   Sodium 136 135 - 145 mmol/L   Potassium 3.5 3.5 - 5.1 mmol/L   Chloride 110 101 - 111 mmol/L   CO2 17 (L) 22 - 32 mmol/L   Glucose, Bld 92 65 - 99 mg/dL   BUN 21 (H) 6 - 20  mg/dL   Creatinine, Ser 1.25 (H) 0.61 - 1.24 mg/dL   Calcium 7.0 (L) 8.9 - 10.3 mg/dL   GFR calc non Af Amer 55 (L) >60 mL/min   GFR calc Af Amer >60 >60 mL/min   Anion gap 9 5 - 15     Studies/Results: No results found.  . Chlorhexidine Gluconate Cloth  6 each Topical Once  . feeding supplement  1 Container Oral TID BM  . folic acid  1 mg Oral Daily  . sodium bicarbonate  1,300 mg Oral BID  . thiamine  100 mg Oral Daily     Assessment/Plan: s/p Procedure(s): LAPAROSCOPIC SIGMOID COLOSTOMY OPEN INSERTION OF SUPRAPUBIC CATHETER INCISION AND DRAINAGE OF PERINEUM ABCESS  Stable postop advance to full liquids Continue wound care to perineal wound.  Likely has fistula to perineum for prostate and urethra but no further treatment necessary since he's been diverted Appreciate [facility] involvement Appreciate urology involvement Likely will need SNF  @PROBHOSP @  LOS: 8 days    Colin [name] M 04/26/2017  . .prob

## 2017-04-26 NOTE — Progress Notes (Signed)
Colin [name]  ZOX:[id number] DOB: 10/02/1940 DOA: 04/18/2017 PCP: Elfredia Nevins, MD    Brief Narrative:  76yo male with a Hx of stage 3 CKD, ETOHism, Hepatitis C, HTN, Anemia, Prostate Cancer, and DM2 who presented to [facility] on 10/12 with "black" stool for one month which had progressively worsened over 3 days. Hemoglobin 8.8, WBC 41.2. C.Diff negative, CT A/P with apparent fistula between the prostate gland / prostatic urethra and the anterior wall of the rectum. Foley was placed with black stool output noted. Patient was transferred to [facility] for further management. 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   Significant Events: 10/12 admit through [facility] ED  Subjective: Patient getting ready to get off his bed to the chair. States that he is doing okay. Denies complaints. Specifically denies much pain. Tolerating diet. Asking if his family can bring him some ice cream. As per RN, no acute issues reported.  Assessment & Plan:  Rectourethral fistula General surgery and urology consulted and patient underwent combined suprapubic tube, diverting colostomy for rectourethral fistula with periurethral abscess drainage on 10/18. GI and surgery following. Advancing diet.  Periurethral abscess Status post surgical drainage in OR 10/18. As per ID follow-up, plan to treat for 14 days with Zosyn using 10/18 as day 1. Left upper arm midline placed 10/17.  GI Bleed - Acute blood loss anemia  Hemoglobin stable in the mid 7 g range. Follow CBCs closely and transfuse if hemoglobin is 7 or less. S/P 1 PRBC at surgery on 10/18. Post Hb 8.5 >8.2 >7.9.  MRSA bacteremia  ID directing care of this issue . As per ID follow-up, plan to treat minimum of 14 days using 10/18 as day 1.  Severe hypokalemia  likely due to signif GI losses due to above. Replaced. Follow BMP closely and replace as needed.  Non-anion  gap metabolic acidosis Due to renal failure as well as possible GI bicarb losses. Started oral sodium bicarbonate tablets 10/17, likely not enough time to act. Follow BMP closely. Slowly improving.  Acute encephalopathy/? Portosystemic - As per chart review, not sure how much alcohol he uses. Concern for thiamine and folate deficiency, ordered supplements. Improved.  Hypernatremia  Reflective of free water deficit - corrected w/ free water resuscitation    Elevated Troponin in setting of demand ischemia  Troponin never signif elevated   HTN BP currently well controlled   AKI on CKD Stage 3 baseline Crt 1.4-1.8. Creatinine has gone up slightly to 1.27. Brief hydration and follow BMP. Creatinine stable.  Liver Cirrhosis - Chronic Hep C   ?Cellulitis to Right LE  No evidence on exam today to suggest an active infection - B LE venous duplex w/o DVT on 04/19/17. Stable without change.  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   Consultants:  PCCM Gen Surgery Urology  Nephrology  ID  Antimicrobials:  Vancomycin 10/12 > Meropenem 10/12 >10/17  Zosyn 10/17>   Objective: Vitals:   04/26/17 0200 04/26/17 0547 04/26/17 0846 04/26/17 1246  BP: 114/61 110/64 106/64 92/65  Pulse: 71 72 90 90  Resp: 19 18 18 18   Temp: 98.8 F (37.1 C) 98.2 F (36.8 C) 98 F (36.7 C) 98 F (36.7 C)  TempSrc: Oral Oral Oral Oral  SpO2: 98% 98% 98% 100%  Weight:  80.9 kg (178 lb 6.4 oz)    Height:           Intake/Output Summary (Last 24 hours) at 04/26/17 1355 Last data filed at 04/26/17 1000  Gross per 24 hour  Intake             1380 ml  Output             2775 ml  Net            -1395 ml   Filed Weights   04/24/17 0500 04/25/17 0700 04/26/17 0547  Weight: 80.4 kg (177 lb 3.2 oz) 84.8 kg (187 lb) 80.9 kg (178 lb 6.4 oz)    Examination: General: Elderly male, moderately built and nourished, lying comfortably propped up in bed. Lungs: Clear to auscultation. No increased work of breathing. Stable without change. Cardiovascular: S1 and S2 heard, RRR. No JVD, murmurs or pedal edema. Telemetry personally reviewed: Sinus rhythm, discontinued 10/20. Abdomen: Nondistended, soft and nontender. Normal bowel sounds heard. Newly placed suprapubic catheter, postop dressing clean and dry.LLQ diverting end sigmoid colostomy with liquidgreen stools. Stable. Extremities: Moving all limbs symmetrically. No cyanosis clubbing or edema.stable CNS: Alert and oriented. No focal neurological deficits.stable  CBC:  Recent Labs Lab 04/21/17 0241 04/22/17 0255 04/23/17 0356 04/24/17 1408 04/25/17 0433 04/26/17 0425  WBC 18.9* 15.4* 17.7*  --  18.2* 12.7*  HGB 7.6* 7.9* 7.6* 8.5* 8.2* 7.9*  HCT 22.6* 24.1* 23.4* 27.0* 25.2* 24.0*  MCV 79.0 81.4 83.3  --  84.0 85.7  PLT 361 332 289  --  212 202   Basic Metabolic Panel:  Recent Labs Lab 04/20/17 0531 04/21/17 0241 04/22/17 0255 04/23/17 0356 04/24/17 0614 04/25/17 0433 04/26/17 0425  NA 143 148* 144 137 135 137 136  K 2.8* 2.5* 2.9* 4.2 4.1 3.9 3.5  CL 116* 125* 120* 116* 114* 112* 110  CO2 14* 14* 14* 13* 13* 15* 17*  GLUCOSE 123* 121* 111* 90 102* 147* 92  BUN 116* 104* 76* 56* 37* 29* 21*  CREATININE 2.41* 1.85* 1.52* 1.25* 1.19 1.27* 1.25*  CALCIUM 8.2* 8.1* 7.8* 7.7* 7.3* 7.3* 7.0*  MG 2.1 2.1  --  1.6* 1.9  --   --   PHOS 2.3* 1.7*  --  <1.0* 2.0* 3.7  --    GFR: Estimated Creatinine Clearance: 51 mL/min (A) (by C-G  formula based on SCr of 1.25 mg/dL (H)).  Liver Function Tests:  Recent Labs Lab 04/22/17 0255 04/23/17 0356 04/25/17 0433  AST 33 40  --   ALT 22 20  --   ALKPHOS 214* 210*  --   BILITOT 0.7 0.7  --   PROT 5.5* 5.3*  --   ALBUMIN 1.9* 1.8* 1.9*    Recent Labs Lab 04/21/17 1151  AMMONIA 20    Coagulation Profile:  Recent Labs Lab 04/23/17 0356  INR 1.23    Cardiac Enzymes:  Recent Labs Lab 04/19/17 1441  TROPONINI 0.04*     CBG:  Recent Labs Lab 04/21/17 1126 04/21/17 1548 04/21/17 2006 04/21/17 2326 04/24/17 1204  GLUCAP 111* 137* 130* 115*  154*    Recent Results (from the past 240 hour(s))  Blood Culture (routine x 2)     Status: Abnormal   Collection Time: 04/18/17  7:09 PM  Result Value Ref Range Status   Specimen Description RIGHT ANTECUBITAL  Final   Special Requests   Final    BOTTLES DRAWN AEROBIC AND ANAEROBIC Blood Culture adequate volume   Culture  Setup Time   Final    GRAM POSITIVE COCCI AEROBIC BOTTLE ONLY Gram Stain Report Called to,Read Back By and Verified With: EVERETT,R @1604  BY MATTHEWS, B 10.13.18 Performed at [facility] CRITICAL RESULT CALLED TO, READ BACK BY AND VERIFIED WITH: Verneita Griffes RUMBARGER [phone] MLM Performed at [facility], [address] [place], [place] [address]    Culture METHICILLIN RESISTANT STAPHYLOCOCCUS AUREUS (A)  Final   Report Status 04/21/2017 FINAL  Final   Organism ID, Bacteria METHICILLIN RESISTANT STAPHYLOCOCCUS AUREUS  Final      Susceptibility   Methicillin resistant staphylococcus aureus - MIC*    CIPROFLOXACIN >=8 RESISTANT Resistant     ERYTHROMYCIN >=8 RESISTANT Resistant     GENTAMICIN <=0.5 SENSITIVE Sensitive     OXACILLIN >=4 RESISTANT Resistant     TETRACYCLINE <=1 SENSITIVE Sensitive     VANCOMYCIN 1 SENSITIVE Sensitive     TRIMETH/SULFA <=10 SENSITIVE Sensitive     CLINDAMYCIN >=8 RESISTANT Resistant     RIFAMPIN <=0.5 SENSITIVE Sensitive     Inducible  Clindamycin NEGATIVE Sensitive     * METHICILLIN RESISTANT STAPHYLOCOCCUS AUREUS  Blood Culture ID Panel (Reflexed)     Status: Abnormal   Collection Time: 04/18/17  7:09 PM  Result Value Ref Range Status   Enterococcus species NOT DETECTED NOT DETECTED Final   Listeria monocytogenes NOT DETECTED NOT DETECTED Final   Staphylococcus species DETECTED (A) NOT DETECTED Final    Comment: CRITICAL RESULT CALLED TO, READ BACK BY AND VERIFIED WITH: PHARMD R RUMBARGER [phone] MLM    Staphylococcus aureus DETECTED (A) NOT DETECTED Final    Comment: Methicillin (oxacillin)-resistant Staphylococcus aureus (MRSA). MRSA is predictably resistant to beta-lactam antibiotics (except ceftaroline). Preferred therapy is vancomycin unless clinically contraindicated. Patient requires contact precautions if  hospitalized. CRITICAL RESULT CALLED TO, READ BACK BY AND VERIFIED WITH: PHARMD R RUMBARGER [phone] MLM    Methicillin resistance DETECTED (A) NOT DETECTED Final    Comment: CRITICAL RESULT CALLED TO, READ BACK BY AND VERIFIED WITH: PHARMD R RUMBARGER [phone] MLM    Streptococcus species NOT DETECTED NOT DETECTED Final   Streptococcus agalactiae NOT DETECTED NOT DETECTED Final   Streptococcus pneumoniae NOT DETECTED NOT DETECTED Final   Streptococcus pyogenes NOT DETECTED NOT DETECTED Final   Acinetobacter baumannii NOT DETECTED NOT DETECTED Final   Enterobacteriaceae species NOT DETECTED NOT DETECTED Final   Enterobacter cloacae complex NOT DETECTED NOT DETECTED Final   Escherichia coli NOT DETECTED NOT DETECTED Final   Klebsiella oxytoca NOT DETECTED NOT DETECTED Final   Klebsiella pneumoniae NOT DETECTED NOT DETECTED Final   Proteus species NOT DETECTED NOT DETECTED Final   Serratia marcescens NOT DETECTED NOT DETECTED Final   Haemophilus influenzae NOT DETECTED NOT DETECTED Final   Neisseria meningitidis NOT DETECTED NOT DETECTED Final   Pseudomonas aeruginosa NOT DETECTED NOT DETECTED  Final   Candida albicans NOT DETECTED NOT DETECTED Final   Candida glabrata NOT DETECTED NOT DETECTED Final   Candida krusei NOT DETECTED NOT DETECTED Final   Candida parapsilosis NOT DETECTED NOT DETECTED Final  Candida tropicalis NOT DETECTED NOT DETECTED Final    Comment: Performed at [facility], [address], [place], [place] [address]  Gastrointestinal Panel by PCR , Stool     Status: None   Collection Time: 04/18/17  9:05 PM  Result Value Ref Range Status   Campylobacter species NOT DETECTED NOT DETECTED Final   Plesimonas shigelloides NOT DETECTED NOT DETECTED Final   Salmonella species NOT DETECTED NOT DETECTED Final   Yersinia enterocolitica NOT DETECTED NOT DETECTED Final   Vibrio species NOT DETECTED NOT DETECTED Final   Vibrio cholerae NOT DETECTED NOT DETECTED Final   Enteroaggregative E coli (EAEC) NOT DETECTED NOT DETECTED Final   Enteropathogenic E coli (EPEC) NOT DETECTED NOT DETECTED Final   Enterotoxigenic E coli (ETEC) NOT DETECTED NOT DETECTED Final   Shiga like toxin producing E coli (STEC) NOT DETECTED NOT DETECTED Final   Shigella/Enteroinvasive E coli (EIEC) NOT DETECTED NOT DETECTED Final   Cryptosporidium NOT DETECTED NOT DETECTED Final   Cyclospora cayetanensis NOT DETECTED NOT DETECTED Final   Entamoeba histolytica NOT DETECTED NOT DETECTED Final   Giardia lamblia NOT DETECTED NOT DETECTED Final   Adenovirus F40/41 NOT DETECTED NOT DETECTED Final   Astrovirus NOT DETECTED NOT DETECTED Final   Norovirus GI/GII NOT DETECTED NOT DETECTED Final   Rotavirus A NOT DETECTED NOT DETECTED Final   Sapovirus (I, II, IV, and V) NOT DETECTED NOT DETECTED Final  C difficile quick scan w PCR reflex     Status: None   Collection Time: 04/18/17  9:05 PM  Result Value Ref Range Status   C Diff antigen NEGATIVE NEGATIVE Final   C Diff toxin NEGATIVE NEGATIVE Final   C Diff interpretation No C. difficile detected.  Final    Comment: VALID  Blood Culture  (routine x 2)     Status: None   Collection Time: 04/18/17 10:26 PM  Result Value Ref Range Status   Specimen Description RIGHT ANTECUBITAL  Final   Special Requests   Final    BOTTLES DRAWN AEROBIC ONLY Blood Culture adequate volume   Culture NO GROWTH 5 DAYS  Final   Report Status 04/23/2017 FINAL  Final  MRSA PCR Screening     Status: None   Collection Time: 04/19/17  3:06 AM  Result Value Ref Range Status   MRSA by PCR NEGATIVE NEGATIVE Final    Comment:        The GeneXpert MRSA Assay (FDA approved for NASAL specimens only), is one component of a comprehensive MRSA colonization surveillance program. It is not intended to diagnose MRSA infection nor to guide or monitor treatment for MRSA infections.   Culture, blood (Routine X 2) w Reflex to ID Panel     Status: None   Collection Time: 04/20/17 11:35 AM  Result Value Ref Range Status   Specimen Description BLOOD BLOOD RIGHT HAND  Final   Special Requests   Final    BOTTLES DRAWN AEROBIC AND ANAEROBIC Blood Culture adequate volume   Culture NO GROWTH 5 DAYS  Final   Report Status 04/25/2017 FINAL  Final  Culture, blood (Routine X 2) w Reflex to ID Panel     Status: None   Collection Time: 04/20/17 11:41 AM  Result Value Ref Range Status   Specimen Description BLOOD BLOOD LEFT HAND  Final   Special Requests IN PEDIATRIC BOTTLE Blood Culture adequate volume  Final   Culture NO GROWTH 5 DAYS  Final   Report Status 04/25/2017 FINAL  Final     Scheduled Meds: . Chlorhexidine Gluconate Cloth  6 each Topical Once  . feeding supplement  1 Container Oral TID BM  . folic acid  1 mg Oral Daily  . sodium bicarbonate  1,300 mg Oral BID  . thiamine  100 mg Oral Daily     LOS: 8 days   Mathhew Buysse, MD, FACP, FHM. [facility] Pager [phone]  If 7PM-7AM, please contact night-coverage [email or web address] Password [email or web address] 04/26/2017, 1:55 PM

## 2017-04-27 DIAGNOSIS — E876 Hypokalemia: Secondary | ICD-10-CM

## 2017-04-27 LAB — BASIC METABOLIC PANEL
ANION GAP: 5 (ref 5–15)
BUN: 16 mg/dL (ref 6–20)
CALCIUM: 7 mg/dL — AB (ref 8.9–10.3)
CO2: 19 mmol/L — AB (ref 22–32)
CREATININE: 1.19 mg/dL (ref 0.61–1.24)
Chloride: 113 mmol/L — ABNORMAL HIGH (ref 101–111)
GFR calc non Af Amer: 58 mL/min — ABNORMAL LOW (ref >60)
Glucose, Bld: 103 mg/dL — ABNORMAL HIGH (ref 65–99)
Potassium: 3.4 mmol/L — ABNORMAL LOW (ref 3.5–5.1)
Sodium: 137 mmol/L (ref 135–145)

## 2017-04-27 LAB — CBC
HCT: 24 % — ABNORMAL LOW (ref 39.0–52.0)
Hemoglobin: 7.6 g/dL — ABNORMAL LOW (ref 13.0–17.0)
MCH: 27 pg (ref 26.0–34.0)
MCHC: 31.7 g/dL (ref 30.0–36.0)
MCV: 85.4 fL (ref 78.0–100.0)
PLATELETS: 200 10*3/uL (ref 150–400)
RBC: 2.81 MIL/uL — ABNORMAL LOW (ref 4.22–5.81)
RDW: 20.8 % — AB (ref 11.5–15.5)
WBC: 10.7 10*3/uL — AB (ref 4.0–10.5)

## 2017-04-27 LAB — BPAM RBC
BLOOD PRODUCT EXPIRATION DATE: 201811152359
BLOOD PRODUCT EXPIRATION DATE: 201811152359
ISSUE DATE / TIME: 201810181017
Unit Type and Rh: 5100
Unit Type and Rh: 5100

## 2017-04-27 LAB — TYPE AND SCREEN
ABO/RH(D): O POS
ANTIBODY SCREEN: NEGATIVE
UNIT DIVISION: 0
Unit division: 0

## 2017-04-27 MED ORDER — POTASSIUM CHLORIDE CRYS ER 20 MEQ PO TBCR
40.0000 meq | EXTENDED_RELEASE_TABLET | Freq: Once | ORAL | Status: AC
  Administered 2017-04-27: 40 meq via ORAL
  Filled 2017-04-27: qty 2

## 2017-04-27 NOTE — Progress Notes (Signed)
3 Days Post-Op Subjective: Patient reports that he is fairly comfortable. Mild discomfort in his perineal region.  Feels deconditioned and weak.  Objective: Vital signs in last 24 hours: Temp:  [98 F (36.7 C)-98.4 F (36.9 C)] 98.4 F (36.9 C) (10/21 0525) Pulse Rate:  [78-90] 81 (10/21 0525) Resp:  [18] 18 (10/21 0525) BP: (92-104)/(61-65) 104/65 (10/21 0525) SpO2:  [100 %] 100 % (10/21 0525) Weight:  [82.7 kg (182 lb 6.4 oz)] 82.7 kg (182 lb 6.4 oz) (10/21 0525)  Intake/Output from previous day: 10/20 0701 - 10/21 0700 In: 2483.8 [P.O.:960; I.V.:1173.8; IV Piggyback:300] Out: 1500 [Urine:1350; Stool:150] Intake/Output this shift: No intake/output data recorded.  Physical Exam:  Constitutional: Vital signs reviewed. WD WN in NAD   Eyes: PERRL, No scleral icterus.   Cardiovascular: RRR Pulmonary/Chest: Normal effort Abdominal: Soft. Non-tender, some stool in bag. SP insertion site clean Genitourinary:Moderate perineal abscess drainage   Lab Results:  Recent Labs  04/25/17 0433 04/26/17 0425 04/27/17 0434  HGB 8.2* 7.9* 7.6*  HCT 25.2* 24.0* 24.0*   BMET  Recent Labs  04/26/17 0425 04/27/17 0434  NA 136 137  K 3.5 3.4*  CL 110 113*  CO2 17* 19*  GLUCOSE 92 103*  BUN 21* 16  CREATININE 1.25* 1.19  CALCIUM 7.0* 7.0*   No results for input(s): LABPT, INR in the last 72 hours. No results for input(s): LABURIN in the last 72 hours. Results for orders placed or performed during the hospital encounter of 04/18/17  Blood Culture (routine x 2)     Status: Abnormal   Collection Time: 04/18/17  7:09 PM  Result Value Ref Range Status   Specimen Description RIGHT ANTECUBITAL  Final   Special Requests   Final    BOTTLES DRAWN AEROBIC AND ANAEROBIC Blood Culture adequate volume   Culture  Setup Time   Final    GRAM POSITIVE COCCI AEROBIC BOTTLE ONLY Gram Stain Report Called to,Read Back By and Verified With: EVERETT,R @1604  BY MATTHEWS, B 10.13.18 Performed at  [place], READ BACK BY AND VERIFIED WITH: Juanell Fairly [phone] MLM Performed at [facility], [place] [address], [place], [place] [address]    Culture METHICILLIN RESISTANT STAPHYLOCOCCUS AUREUS (A)  Final   Report Status 04/21/2017 FINAL  Final   Organism ID, Bacteria METHICILLIN RESISTANT STAPHYLOCOCCUS AUREUS  Final      Susceptibility   Methicillin resistant staphylococcus aureus - MIC*    CIPROFLOXACIN >=8 RESISTANT Resistant     ERYTHROMYCIN >=8 RESISTANT Resistant     GENTAMICIN <=0.5 SENSITIVE Sensitive     OXACILLIN >=4 RESISTANT Resistant     TETRACYCLINE <=1 SENSITIVE Sensitive     VANCOMYCIN 1 SENSITIVE Sensitive     TRIMETH/SULFA <=10 SENSITIVE Sensitive     CLINDAMYCIN >=8 RESISTANT Resistant     RIFAMPIN <=0.5 SENSITIVE Sensitive     Inducible Clindamycin NEGATIVE Sensitive     * METHICILLIN RESISTANT STAPHYLOCOCCUS AUREUS  Blood Culture ID Panel (Reflexed)     Status: Abnormal   Collection Time: 04/18/17  7:09 PM  Result Value Ref Range Status   Enterococcus species NOT DETECTED NOT DETECTED Final   Listeria monocytogenes NOT DETECTED NOT DETECTED Final   Staphylococcus species DETECTED (A) NOT DETECTED Final    Comment: CRITICAL RESULT CALLED TO, READ BACK BY AND VERIFIED WITH: PHARMD R RUMBARGER [phone] MLM    Staphylococcus aureus DETECTED (A) NOT DETECTED Final    Comment: Methicillin (oxacillin)-resistant  Staphylococcus aureus (MRSA). MRSA is predictably resistant to beta-lactam antibiotics (except ceftaroline). Preferred therapy is vancomycin unless clinically contraindicated. Patient requires contact precautions if  hospitalized. CRITICAL RESULT CALLED TO, READ BACK BY AND VERIFIED WITH: PHARMD R RUMBARGER [phone] MLM    Methicillin resistance DETECTED (A) NOT DETECTED Final    Comment: CRITICAL RESULT CALLED TO, READ BACK BY AND VERIFIED WITH: PHARMD R RUMBARGER [phone] MLM    Streptococcus  species NOT DETECTED NOT DETECTED Final   Streptococcus agalactiae NOT DETECTED NOT DETECTED Final   Streptococcus pneumoniae NOT DETECTED NOT DETECTED Final   Streptococcus pyogenes NOT DETECTED NOT DETECTED Final   Acinetobacter baumannii NOT DETECTED NOT DETECTED Final   Enterobacteriaceae species NOT DETECTED NOT DETECTED Final   Enterobacter cloacae complex NOT DETECTED NOT DETECTED Final   Escherichia coli NOT DETECTED NOT DETECTED Final   Klebsiella oxytoca NOT DETECTED NOT DETECTED Final   Klebsiella pneumoniae NOT DETECTED NOT DETECTED Final   Proteus species NOT DETECTED NOT DETECTED Final   Serratia marcescens NOT DETECTED NOT DETECTED Final   Haemophilus influenzae NOT DETECTED NOT DETECTED Final   Neisseria meningitidis NOT DETECTED NOT DETECTED Final   Pseudomonas aeruginosa NOT DETECTED NOT DETECTED Final   Candida albicans NOT DETECTED NOT DETECTED Final   Candida glabrata NOT DETECTED NOT DETECTED Final   Candida krusei NOT DETECTED NOT DETECTED Final   Candida parapsilosis NOT DETECTED NOT DETECTED Final   Candida tropicalis NOT DETECTED NOT DETECTED Final    Comment: Performed at [facility], [place] [place], [place] [address]  Gastrointestinal Panel by PCR , Stool     Status: None   Collection Time: 04/18/17  9:05 PM  Result Value Ref Range Status   Campylobacter species NOT DETECTED NOT DETECTED Final   Plesimonas shigelloides NOT DETECTED NOT DETECTED Final   Salmonella species NOT DETECTED NOT DETECTED Final   Yersinia enterocolitica NOT DETECTED NOT DETECTED Final   Vibrio species NOT DETECTED NOT DETECTED Final   Vibrio cholerae NOT DETECTED NOT DETECTED Final   Enteroaggregative E coli (EAEC) NOT DETECTED NOT DETECTED Final   Enteropathogenic E coli (EPEC) NOT DETECTED NOT DETECTED Final   Enterotoxigenic E coli (ETEC) NOT DETECTED NOT DETECTED Final   Shiga like toxin producing E coli (STEC) NOT DETECTED NOT DETECTED Final    Shigella/Enteroinvasive E coli (EIEC) NOT DETECTED NOT DETECTED Final   Cryptosporidium NOT DETECTED NOT DETECTED Final   Cyclospora cayetanensis NOT DETECTED NOT DETECTED Final   Entamoeba histolytica NOT DETECTED NOT DETECTED Final   Giardia lamblia NOT DETECTED NOT DETECTED Final   Adenovirus F40/41 NOT DETECTED NOT DETECTED Final   Astrovirus NOT DETECTED NOT DETECTED Final   Norovirus GI/GII NOT DETECTED NOT DETECTED Final   Rotavirus A NOT DETECTED NOT DETECTED Final   Sapovirus (I, II, IV, and V) NOT DETECTED NOT DETECTED Final  C difficile quick scan w PCR reflex     Status: None   Collection Time: 04/18/17  9:05 PM  Result Value Ref Range Status   C Diff antigen NEGATIVE NEGATIVE Final   C Diff toxin NEGATIVE NEGATIVE Final   C Diff interpretation No C. difficile detected.  Final    Comment: VALID  Blood Culture (routine x 2)     Status: None   Collection Time: 04/18/17 10:26 PM  Result Value Ref Range Status   Specimen Description RIGHT ANTECUBITAL  Final   Special Requests   Final    BOTTLES DRAWN  AEROBIC ONLY Blood Culture adequate volume   Culture NO GROWTH 5 DAYS  Final   Report Status 04/23/2017 FINAL  Final  MRSA PCR Screening     Status: None   Collection Time: 04/19/17  3:06 AM  Result Value Ref Range Status   MRSA by PCR NEGATIVE NEGATIVE Final    Comment:        The GeneXpert MRSA Assay (FDA approved for NASAL specimens only), is one component of a comprehensive MRSA colonization surveillance program. It is not intended to diagnose MRSA infection nor to guide or monitor treatment for MRSA infections.   Culture, blood (Routine X 2) w Reflex to ID Panel     Status: None   Collection Time: 04/20/17 11:35 AM  Result Value Ref Range Status   Specimen Description BLOOD BLOOD RIGHT HAND  Final   Special Requests   Final    BOTTLES DRAWN AEROBIC AND ANAEROBIC Blood Culture adequate volume   Culture NO GROWTH 5 DAYS  Final   Report Status 04/25/2017 FINAL   Final  Culture, blood (Routine X 2) w Reflex to ID Panel     Status: None   Collection Time: 04/20/17 11:41 AM  Result Value Ref Range Status   Specimen Description BLOOD BLOOD LEFT HAND  Final   Special Requests IN PEDIATRIC BOTTLE Blood Culture adequate volume  Final   Culture NO GROWTH 5 DAYS  Final   Report Status 04/25/2017 FINAL  Final    Studies/Results: No results found.  Assessment/Plan:   POD 3 combined sp tube/diverting colostomy for rectourethral fistula w/ periurethral abscess drainage.  Progressing well.    Diligent wound care to the perineal abscess moving forward is crucial, as it may be reaccumulating since it was not packed this a.m., question whether it has been packed at all over the last several days.  I reviewed this with his nurse, and modifed the orders to ensure that it explicitly mentions packing the wound with forceps and gauze, and increased the frequency to 3 times daily.    We will continue to monitor.   LOS: 9 days   Ardis Hughs 04/27/2017, 10:56 AM

## 2017-04-27 NOTE — Progress Notes (Signed)
3 Days Post-Op  Subjective: Tolerating full liquids.  Wants regular diet Having some incisional pain, mostly peroneal posterior to scrotum where the abscess was drained Passing gas and stool per ostomy. Alert and stable.  Good urine output. WBC 10,700.  Hemoglobin 7.6.  Potassium 3.4 already treated by Dr. Lavella Lemons.    Creatinine 1.19.  Objective: Vital signs in last 24 hours: Temp:  [98 F (36.7 C)-98.4 F (36.9 C)] 98.4 F (36.9 C) (10/21 0525) Pulse Rate:  [78-90] 81 (10/21 0525) Resp:  [18] 18 (10/21 0525) BP: (92-104)/(61-65) 104/65 (10/21 0525) SpO2:  [100 %] 100 % (10/21 0525) Weight:  [82.7 kg (182 lb 6.4 oz)] 82.7 kg (182 lb 6.4 oz) (10/21 0525) Last BM Date: 04/26/17  Intake/Output from previous day: 10/20 0701 - 10/21 0700 In: 2483.8 [P.O.:960; I.V.:1173.8; IV Piggyback:300] Out: 1500 [Urine:1350; Stool:150] Intake/Output this shift: No intake/output data recorded.   EXAM: General appearance: alert.  Sitting up in chair.  No distress cooperative Resp: clear to auscultation bilaterally GI: soft.  Minimally distended if at all.  Stoma pink.  Gas and stool in bag.  Wounds okay. Perineal wound drainage low-volume    Lab Results:  Results for orders placed or performed during the hospital encounter of 04/18/17 (from the past 24 hour(s))  Vancomycin, trough     Status: Abnormal   Collection Time: 04/26/17  3:18 PM  Result Value Ref Range   Vancomycin Tr 21 (HH) 15 - 20 ug/mL  CBC     Status: Abnormal   Collection Time: 04/27/17  4:34 AM  Result Value Ref Range   WBC 10.7 (H) 4.0 - 10.5 K/uL   RBC 2.81 (L) 4.22 - 5.81 MIL/uL   Hemoglobin 7.6 (L) 13.0 - 17.0 g/dL   HCT 24.0 (L) 39.0 - 52.0 %   MCV 85.4 78.0 - 100.0 fL   MCH 27.0 26.0 - 34.0 pg   MCHC 31.7 30.0 - 36.0 g/dL   RDW 20.8 (H) 11.5 - 15.5 %   Platelets 200 150 - 400 K/uL  Basic metabolic panel     Status: Abnormal   Collection Time: 04/27/17  4:34 AM  Result Value Ref Range   Sodium 137 135 -  145 mmol/L   Potassium 3.4 (L) 3.5 - 5.1 mmol/L   Chloride 113 (H) 101 - 111 mmol/L   CO2 19 (L) 22 - 32 mmol/L   Glucose, Bld 103 (H) 65 - 99 mg/dL   BUN 16 6 - 20 mg/dL   Creatinine, Ser 1.19 0.61 - 1.24 mg/dL   Calcium 7.0 (L) 8.9 - 10.3 mg/dL   GFR calc non Af Amer 58 (L) >60 mL/min   GFR calc Af Amer >60 >60 mL/min   Anion gap 5 5 - 15     Studies/Results: No results found.  . Chlorhexidine Gluconate Cloth  6 each Topical Once  . feeding supplement  1 Container Oral TID BM  . folic acid  1 mg Oral Daily  . potassium chloride  40 mEq Oral Once  . sodium bicarbonate  1,300 mg Oral BID  . thiamine  100 mg Oral Daily     Assessment/Plan: s/p Procedure(s): LAPAROSCOPIC SIGMOID COLOSTOMY OPEN INSERTION OF SUPRAPUBIC CATHETER INCISION AND DRAINAGE OF PERINEUM ABCESS   Stable postop advance to soft diet Ambulate more.  This was encouraged Continue wound care to perineal wound.  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 .prob

## 2017-04-27 NOTE — Progress Notes (Signed)
Colin [name]  DGU:[id number] DOB: [birth year] DOA: 04/18/2017 PCP: Elfredia Nevins, MD    Brief Narrative:  76yo male with a Hx of stage 3 CKD, ETOHism, Hepatitis C, HTN, Anemia, Prostate Cancer, and DM2 who presented to [facility] ED on 10/12 with "black" stool for one month which had progressively worsened over 3 days. Hemoglobin 8.8, WBC 41.2. C.Diff negative, CT A/P with apparent fistula between the prostate gland / prostatic urethra and the anterior wall of the rectum. Foley was placed with black stool output noted. Patient was transferred to [facility] for further management. 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   Significant Events: 10/12 admit through [facility] ED  Subjective: Patient denied complaints. Apparently had been sitting on the chair this morning for a couple of hours and then back in bed. Not much pain reported.  Assessment & Plan:  Rectourethral fistula General surgery and urology consulted and patient underwent combined suprapubic tube, diverting colostomy for rectourethral fistula with periurethral abscess drainage on 10/18. GI and surgery following. Advancing diet.  Periurethral abscess Status post surgical drainage in OR 10/18. As per ID follow-up, plan to treat for 14 days with Zosyn using 10/18 as day 1. Left upper arm midline placed 10/17. As per urology follow-up 10/21, diligent wound care to the perineal abscess is recommended and he discussed with RN. Urology will follow.  GI Bleed - Acute blood loss anemia  Hemoglobin stable in the mid 7 g range. Follow CBCs closely and transfuse if hemoglobin is 7 or less. S/P 1 PRBC at surgery on 10/18. Post Hb 8.5 >8.2 >7.9 >7.6. If he continues to gradually drop, may need blood transfusion again.  MRSA bacteremia  ID directing care of this issue . As per ID follow-up, plan to treat minimum of 14 days using 10/18 as day  1.  Hypokalemia  Replace and follow as needed  Non-anion gap metabolic acidosis Due to renal failure as well as possible GI bicarb losses. Started oral sodium bicarbonate tablets 10/17, likely not enough time to act. Follow BMP closely. Continues to gradually improve.  Acute encephalopathy/? Portosystemic - As per chart review, not sure how much alcohol he uses. Concern for thiamine and folate deficiency, ordered supplements. Improved.  Hypernatremia  Reflective of free water deficit - corrected w/ free water resuscitation    Elevated Troponin in setting of demand ischemia  Troponin never signif elevated   HTN BP currently well controlled   AKI on CKD Stage 3 baseline Crt 1.4-1.8. Creatinine back down to 1.19 post hydration.  Liver Cirrhosis - Chronic Hep C   ?Cellulitis to Right LE  No evidence on exam today to suggest an active infection - B LE venous duplex w/o DVT on 04/19/17. Stable without change.  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   Consultants:  PCCM Gen Surgery Urology  Nephrology  ID  Antimicrobials:  Vancomycin 10/12 > Meropenem 10/12 >10/17  Zosyn 10/17>   Objective: Vitals:   04/26/17 0846 04/26/17 1246 04/26/17 2005 04/27/17 0525  BP: 106/64 92/65 103/61 104/65  Pulse: 90 90 78 81  Resp: 18 18 18 18   Temp: 98 F (36.7 C) 98 F (36.7 C) 98.4 F (36.9 C) 98.4 F (36.9 C)  TempSrc: Oral Oral Oral Oral  SpO2: 98% 100% 100% 100%  Weight:    82.7  kg (182 lb 6.4 oz)  Height:          Intake/Output Summary (Last 24 hours) at 04/27/17 1454 Last data filed at 04/27/17 0525  Gross per 24 hour  Intake          1953.75 ml  Output             1000 ml  Net           953.75 ml   Filed Weights   04/25/17 0700 04/26/17 0547 04/27/17 0525  Weight: 84.8 kg (187 lb) 80.9 kg (178 lb 6.4 oz) 82.7 kg (182 lb 6.4 oz)    Examination: General: Elderly male, moderately built and nourished, lying comfortably propped up in bed. Lungs: Clear to auscultation. No increased work of breathing. Stable without change. Cardiovascular: S1 and S2 heard, RRR. No JVD, murmurs or pedal edema.  Abdomen: Nondistended, soft and nontender. Normal bowel sounds heard. Newly placed suprapubic catheter.LLQ diverting end sigmoid colostomy with liquid green stools - small amount. Stable. Extremities: Moving all limbs symmetrically. No cyanosis clubbing or edema.stable CNS: Alert and oriented 2. No focal neurological deficits.stable  CBC:  Recent Labs Lab 04/22/17 0255 04/23/17 0356 04/24/17 1408 04/25/17 0433 04/26/17 0425 04/27/17 0434  WBC 15.4* 17.7*  --  18.2* 12.7* 10.7*  HGB 7.9* 7.6* 8.5* 8.2* 7.9* 7.6*  HCT 24.1* 23.4* 27.0* 25.2* 24.0* 24.0*  MCV 81.4 83.3  --  84.0 85.7 85.4  PLT 332 289  --  212 202 200   Basic Metabolic Panel:  Recent Labs Lab 04/21/17 0241  04/23/17 0356 04/24/17 0614 04/25/17 0433 04/26/17 0425 04/27/17 0434  NA 148*  < > 137 135 137 136 137  K 2.5*  < > 4.2 4.1 3.9 3.5 3.4*  CL 125*  < > 116* 114* 112* 110 113*  CO2 14*  < > 13* 13* 15* 17* 19*  GLUCOSE 121*  < > 90 102* 147* 92 103*  BUN 104*  < > 56* 37* 29* 21* 16  CREATININE 1.85*  < > 1.25* 1.19 1.27* 1.25* 1.19  CALCIUM 8.1*  < > 7.7* 7.3* 7.3* 7.0* 7.0*  MG 2.1  --  1.6* 1.9  --   --   --   PHOS 1.7*  --  <1.0* 2.0* 3.7  --   --   < > = values in this interval not displayed. GFR: Estimated Creatinine Clearance: 54.2 mL/min (by C-G formula based on SCr  of 1.19 mg/dL).  Liver Function Tests:  Recent Labs Lab 04/22/17 0255 04/23/17 0356 04/25/17 0433  AST 33 40  --   ALT 22 20  --   ALKPHOS 214* 210*  --   BILITOT 0.7 0.7  --   PROT 5.5* 5.3*  --   ALBUMIN 1.9* 1.8* 1.9*    Recent Labs Lab 04/21/17 1151  AMMONIA 20    Coagulation Profile:  Recent Labs Lab 04/23/17 0356  INR 1.23    Cardiac Enzymes: No results for input(s): CKTOTAL, CKMB, CKMBINDEX, TROPONINI in the last 168 hours.   CBG:  Recent Labs  Lab 04/21/17 1126 04/21/17 1548 04/21/17 2006 04/21/17 2326 04/24/17 1204  GLUCAP 111* 137* 130* 115* 154*    Recent Results (from the past 240 hour(s))  Blood Culture (routine x 2)     Status: Abnormal   Collection Time: 04/18/17  7:09 PM  Result Value Ref Range Status   Specimen Description RIGHT ANTECUBITAL  Final   Special Requests   Final    BOTTLES DRAWN AEROBIC AND ANAEROBIC Blood Culture adequate volume   Culture  Setup Time   Final    GRAM POSITIVE COCCI AEROBIC BOTTLE ONLY Gram Stain Report Called to,Read Back By and Verified With: EVERETT,R @1604  BY MATTHEWS, B 10.13.18 Performed at [facility] CRITICAL RESULT CALLED TO, READ BACK BY AND VERIFIED WITH: Verneita Griffes RUMBARGER [phone] MLM Performed at [facility], [address] [place], [place] [address]    Culture METHICILLIN RESISTANT STAPHYLOCOCCUS AUREUS (A)  Final   Report Status 04/21/2017 FINAL  Final   Organism ID, Bacteria METHICILLIN RESISTANT STAPHYLOCOCCUS AUREUS  Final      Susceptibility   Methicillin resistant staphylococcus aureus - MIC*    CIPROFLOXACIN >=8 RESISTANT Resistant     ERYTHROMYCIN >=8 RESISTANT Resistant     GENTAMICIN <=0.5 SENSITIVE Sensitive     OXACILLIN >=4 RESISTANT Resistant     TETRACYCLINE <=1 SENSITIVE Sensitive     VANCOMYCIN 1 SENSITIVE Sensitive     TRIMETH/SULFA <=10 SENSITIVE Sensitive     CLINDAMYCIN >=8 RESISTANT Resistant     RIFAMPIN <=0.5 SENSITIVE Sensitive     Inducible  Clindamycin NEGATIVE Sensitive     * METHICILLIN RESISTANT STAPHYLOCOCCUS AUREUS  Blood Culture ID Panel (Reflexed)     Status: Abnormal   Collection Time: 04/18/17  7:09 PM  Result Value Ref Range Status   Enterococcus species NOT DETECTED NOT DETECTED Final   Listeria monocytogenes NOT DETECTED NOT DETECTED Final   Staphylococcus species DETECTED (A) NOT DETECTED Final    Comment: CRITICAL RESULT CALLED TO, READ BACK BY AND VERIFIED WITH: PHARMD R RUMBARGER [phone] MLM    Staphylococcus aureus DETECTED (A) NOT DETECTED Final    Comment: Methicillin (oxacillin)-resistant Staphylococcus aureus (MRSA). MRSA is predictably resistant to beta-lactam antibiotics (except ceftaroline). Preferred therapy is vancomycin unless clinically contraindicated. Patient requires contact precautions if  hospitalized. CRITICAL RESULT CALLED TO, READ BACK BY AND VERIFIED WITH: PHARMD R RUMBARGER [phone] MLM    Methicillin resistance DETECTED (A) NOT DETECTED Final    Comment: CRITICAL RESULT CALLED TO, READ BACK BY AND VERIFIED WITH: PHARMD R RUMBARGER [phone] MLM    Streptococcus species NOT DETECTED NOT DETECTED Final   Streptococcus agalactiae NOT DETECTED NOT DETECTED Final   Streptococcus pneumoniae NOT DETECTED NOT DETECTED Final   Streptococcus pyogenes NOT DETECTED NOT DETECTED Final   Acinetobacter baumannii NOT DETECTED NOT DETECTED Final   Enterobacteriaceae species NOT DETECTED NOT DETECTED Final   Enterobacter cloacae complex NOT DETECTED NOT DETECTED Final   Escherichia coli NOT DETECTED NOT DETECTED Final   Klebsiella oxytoca NOT DETECTED NOT DETECTED Final   Klebsiella pneumoniae NOT DETECTED NOT DETECTED Final   Proteus species NOT DETECTED NOT DETECTED Final   Serratia marcescens NOT DETECTED NOT DETECTED Final   Haemophilus influenzae NOT DETECTED NOT DETECTED Final   Neisseria meningitidis NOT DETECTED NOT DETECTED Final   Pseudomonas aeruginosa NOT DETECTED NOT DETECTED  Final   Candida albicans NOT DETECTED NOT DETECTED Final   Candida glabrata NOT DETECTED NOT DETECTED Final  Candida krusei NOT DETECTED NOT DETECTED Final   Candida parapsilosis NOT DETECTED NOT DETECTED Final   Candida tropicalis NOT DETECTED NOT DETECTED Final    Comment: Performed at [facility], [address] [place], [place] [address]  Gastrointestinal Panel by PCR , Stool     Status: None   Collection Time: 04/18/17  9:05 PM  Result Value Ref Range Status   Campylobacter species NOT DETECTED NOT DETECTED Final   Plesimonas shigelloides NOT DETECTED NOT DETECTED Final   Salmonella species NOT DETECTED NOT DETECTED Final   Yersinia enterocolitica NOT DETECTED NOT DETECTED Final   Vibrio species NOT DETECTED NOT DETECTED Final   Vibrio cholerae NOT DETECTED NOT DETECTED Final   Enteroaggregative E coli (EAEC) NOT DETECTED NOT DETECTED Final   Enteropathogenic E coli (EPEC) NOT DETECTED NOT DETECTED Final   Enterotoxigenic E coli (ETEC) NOT DETECTED NOT DETECTED Final   Shiga like toxin producing E coli (STEC) NOT DETECTED NOT DETECTED Final   Shigella/Enteroinvasive E coli (EIEC) NOT DETECTED NOT DETECTED Final   Cryptosporidium NOT DETECTED NOT DETECTED Final   Cyclospora cayetanensis NOT DETECTED NOT DETECTED Final   Entamoeba histolytica NOT DETECTED NOT DETECTED Final   Giardia lamblia NOT DETECTED NOT DETECTED Final   Adenovirus F40/41 NOT DETECTED NOT DETECTED Final   Astrovirus NOT DETECTED NOT DETECTED Final   Norovirus GI/GII NOT DETECTED NOT DETECTED Final   Rotavirus A NOT DETECTED NOT DETECTED Final   Sapovirus (I, II, IV, and V) NOT DETECTED NOT DETECTED Final  C difficile quick scan w PCR reflex     Status: None   Collection Time: 04/18/17  9:05 PM  Result Value Ref Range Status   C Diff antigen NEGATIVE NEGATIVE Final   C Diff toxin NEGATIVE NEGATIVE Final   C Diff interpretation No C. difficile detected.  Final    Comment: VALID  Blood Culture  (routine x 2)     Status: None   Collection Time: 04/18/17 10:26 PM  Result Value Ref Range Status   Specimen Description RIGHT ANTECUBITAL  Final   Special Requests   Final    BOTTLES DRAWN AEROBIC ONLY Blood Culture adequate volume   Culture NO GROWTH 5 DAYS  Final   Report Status 04/23/2017 FINAL  Final  MRSA PCR Screening     Status: None   Collection Time: 04/19/17  3:06 AM  Result Value Ref Range Status   MRSA by PCR NEGATIVE NEGATIVE Final    Comment:        The GeneXpert MRSA Assay (FDA approved for NASAL specimens only), is one component of a comprehensive MRSA colonization surveillance program. It is not intended to diagnose MRSA infection nor to guide or monitor treatment for MRSA infections.   Culture, blood (Routine X 2) w Reflex to ID Panel     Status: None   Collection Time: 04/20/17 11:35 AM  Result Value Ref Range Status   Specimen Description BLOOD BLOOD RIGHT HAND  Final   Special Requests   Final    BOTTLES DRAWN AEROBIC AND ANAEROBIC Blood Culture adequate volume   Culture NO GROWTH 5 DAYS  Final   Report Status 04/25/2017 FINAL  Final  Culture, blood (Routine X 2) w Reflex to ID Panel     Status: None   Collection Time: 04/20/17 11:41 AM  Result Value Ref Range Status   Specimen Description BLOOD BLOOD LEFT HAND  Final   Special Requests IN PEDIATRIC BOTTLE Blood Culture adequate  volume  Final   Culture NO GROWTH 5 DAYS  Final   Report Status 04/25/2017 FINAL  Final     Scheduled Meds: . Chlorhexidine Gluconate Cloth  6 each Topical Once  . feeding supplement  1 Container Oral TID BM  . folic acid  1 mg Oral Daily  . sodium bicarbonate  1,300 mg Oral BID  . thiamine  100 mg Oral Daily     LOS: 9 days   Dmitri Pettigrew, MD, FACP, FHM. [facility] Pager [phone]  If 7PM-7AM, please contact night-coverage [email or web address] Password [email or web address] 04/27/2017, 2:54 PM

## 2017-04-28 ENCOUNTER — Inpatient Hospital Stay (HOSPITAL_COMMUNITY): Payer: Medicare Other

## 2017-04-28 DIAGNOSIS — D62 Acute posthemorrhagic anemia: Secondary | ICD-10-CM

## 2017-04-28 DIAGNOSIS — K922 Gastrointestinal hemorrhage, unspecified: Secondary | ICD-10-CM

## 2017-04-28 LAB — CBC
HCT: 28.6 % — ABNORMAL LOW (ref 39.0–52.0)
HEMATOCRIT: 19.9 % — AB (ref 39.0–52.0)
HEMATOCRIT: 28.6 % — AB (ref 39.0–52.0)
HEMOGLOBIN: 6.4 g/dL — AB (ref 13.0–17.0)
Hemoglobin: 9.3 g/dL — ABNORMAL LOW (ref 13.0–17.0)
Hemoglobin: 9.2 g/dL — ABNORMAL LOW (ref 13.0–17.0)
MCH: 27.7 pg (ref 26.0–34.0)
MCH: 27.5 pg (ref 26.0–34.0)
MCH: 27.2 pg (ref 26.0–34.0)
MCHC: 32.2 g/dL (ref 30.0–36.0)
MCHC: 32.5 g/dL (ref 30.0–36.0)
MCHC: 32.2 g/dL (ref 30.0–36.0)
MCV: 86.1 fL (ref 78.0–100.0)
MCV: 84.6 fL (ref 78.0–100.0)
MCV: 84.6 fL (ref 78.0–100.0)
PLATELETS: 200 10*3/uL (ref 150–400)
Platelets: 147 10*3/uL — ABNORMAL LOW (ref 150–400)
Platelets: 217 10*3/uL (ref 150–400)
RBC: 2.31 MIL/uL — ABNORMAL LOW (ref 4.22–5.81)
RBC: 3.38 MIL/uL — AB (ref 4.22–5.81)
RBC: 3.38 MIL/uL — ABNORMAL LOW (ref 4.22–5.81)
RDW: 21.3 % — AB (ref 11.5–15.5)
RDW: 19.4 % — ABNORMAL HIGH (ref 11.5–15.5)
RDW: 19.6 % — ABNORMAL HIGH (ref 11.5–15.5)
WBC: 8.4 10*3/uL (ref 4.0–10.5)
WBC: 11.8 10*3/uL — ABNORMAL HIGH (ref 4.0–10.5)
WBC: 11.1 10*3/uL — ABNORMAL HIGH (ref 4.0–10.5)

## 2017-04-28 LAB — RENAL FUNCTION PANEL
ALBUMIN: 1.2 g/dL — AB (ref 3.5–5.0)
ANION GAP: 6 (ref 5–15)
BUN: 11 mg/dL (ref 6–20)
CALCIUM: 5.4 mg/dL — AB (ref 8.9–10.3)
CO2: 17 mmol/L — ABNORMAL LOW (ref 22–32)
Chloride: 115 mmol/L — ABNORMAL HIGH (ref 101–111)
Creatinine, Ser: 1.01 mg/dL (ref 0.61–1.24)
GFR calc Af Amer: >60 mL/min (ref >60)
Glucose, Bld: 110 mg/dL — ABNORMAL HIGH (ref 65–99)
PHOSPHORUS: 2 mg/dL — AB (ref 2.5–4.6)
POTASSIUM: 4.1 mmol/L (ref 3.5–5.1)
Sodium: 138 mmol/L (ref 135–145)

## 2017-04-28 LAB — PREPARE RBC (CROSSMATCH)

## 2017-04-28 MED ORDER — SODIUM CHLORIDE 0.9 % IV SOLN
1.0000 g | Freq: Once | INTRAVENOUS | Status: AC
  Administered 2017-04-28: 1 g via INTRAVENOUS
  Filled 2017-04-28: qty 10

## 2017-04-28 MED ORDER — SODIUM CHLORIDE 0.9 % IV SOLN: Freq: Once | INTRAVENOUS | Status: DC

## 2017-04-28 MED ORDER — SODIUM PHOSPHATES 45 MMOLE/15ML IV SOLN
20.0000 mmol | Freq: Once | INTRAVENOUS | Status: AC
  Administered 2017-04-28: 20 mmol via INTRAVENOUS
  Filled 2017-04-28: qty 6.67

## 2017-04-28 MED ORDER — TECHNETIUM TC 99M-LABELED RED BLOOD CELLS IV KIT
27.2000 | PACK | Freq: Once | INTRAVENOUS | Status: AC | PRN
  Administered 2017-04-28: 27.2 via INTRAVENOUS

## 2017-04-28 MED ORDER — SODIUM CHLORIDE 0.9 % IV SOLN
Freq: Once | INTRAVENOUS | Status: AC
  Administered 2017-04-28: 15:00:00 via INTRAVENOUS

## 2017-04-28 MED ORDER — ADULT MULTIVITAMIN W/MINERALS CH
1.0000 | ORAL_TABLET | Freq: Every day | ORAL | Status: DC
  Administered 2017-04-28 – 2017-05-01 (×4): 1 via ORAL
  Filled 2017-04-28 (×4): qty 1

## 2017-04-28 MED ORDER — ENSURE ENLIVE PO LIQD
237.0000 mL | Freq: Two times a day (BID) | ORAL | Status: DC
  Administered 2017-04-29 – 2017-05-01 (×5): 237 mL via ORAL

## 2017-04-28 NOTE — Consult Note (Signed)
[facility] Nurse ostomy follow up Stoma type/location: LLQ, end colostomy Stomal assessment/size:  Slightly larger than 1 3/8" round, budded, pink, and moist Peristomal assessment: intact, slight denudation noted from 7-11 o'clock, but minimally  Treatment options for stomal/peristomal skin: using 2" barrier ring to protect skin  Output red/orange colored stool, has had some bleeding? gelatinous Ostomy pouching: 1pc flat with 2" barrier ring.  Education provided:  Demonstrated pouch change to the patient, he is very HOH.  I am not sure how much he can retain.  He was able to use lock and roll closure after several attempt and verbal cues.  He was able to close pouch after my application.  Demonstrated cleaning the spout with toilet paper wicks.  Allowed patient to practice opening to empty pouch.   Feel that this patient will need a great deal of support with his ostomy care.  He reports his exwife and his son will be assisting him at home.  Unclear DC disposition right now.  Enrolled patient in [facility] Start Discharge program: Yes  [facility] nurse will follow along for support and education for ostomy care. [place], [place], [place]

## 2017-04-28 NOTE — Progress Notes (Addendum)
Nutrition Follow-up  DOCUMENTATION CODES:   Not applicable  INTERVENTION:   -D/c Boost Breeze po TID, each supplement provides 250 kcal and 9 grams of protein -MVI daily -Ensure Enlive po BID, each supplement provides 350 kcal and 20 grams of protein  NUTRITION DIAGNOSIS:   Increased nutrient needs related to chronic illness (urethral/rectal fistula) as evidenced by estimated needs.  Ongoing  GOAL:   Patient will meet greater than or equal to 90% of their needs  Progressing  MONITOR:   PO intake, Supplement acceptance, Labs, Weight trends, Skin, I & O's  REASON FOR ASSESSMENT:   NPO/Clear Liquid Diet    ASSESSMENT:   76 year old male presents to ED with black tarry stool. Found to have fistula. Transferred to [facility] for further management.   S/p Procedure(s)04/24/17: LAPAROSCOPIC VS OPEN SIGMOID COLOSTOMY (N/A) OPEN INSERTION OF SUPRAPUBIC CATHETER (N/A)  10/20- advanced to full liquid diet 10/21- advanced to soft diet  Spoke with pt at bedside, who reports good appetite. He does not remember what he ate at breakfast, but think he ate "most of it". Meal completion 50-75%. Pt reports Boost Breeze supplements are "ok", consumed about 25% of most recent dose. Pt with no complaints other than his sacral area being sore. Case and concerns discussed with RN.  Medications reviewed and include folvite, sodium bicarbonate, and vitamin B-1.  Labs reviewed: Phos: 2.0.   Diet Order:  DIET SOFT Room service appropriate? Yes; Fluid consistency: Thin  Skin:   (closed abdominal incision, closed lt perineum incision)  Last BM:  04/26/17 (100 ml output via colostomy)  Height:   Ht Readings from Last 1 Encounters:  04/22/17 5\' 6"  (1.676 m)    Weight:   Wt Readings from Last 1 Encounters:  04/28/17 186 lb 4.8 oz (84.5 kg)    Ideal Body Weight:  64.5 kg  BMI:  Body mass index is 30.07 kg/m.  Estimated Nutritional Needs:   Kcal:  1700-1900  Protein:  90-105  grams  Fluid:  >1.7 L  EDUCATION NEEDS:   Education needs addressed  Ladesha Pacini A. Jimmye Norman, RD, LDN, CDE Pager: [phone] After hours Pager: [phone]

## 2017-04-28 NOTE — Progress Notes (Signed)
[facility]/Trauma Progress Note  4 Days Post-Op   Assessment/Plan Active Problems:   Altered mental status   Acute renal failure (HCC)   Metabolic acidosis   Melena   Diarrhea in adult patient   Heme positive stool   Perineal abscess   Recto-prostatic fistula   MRSA bacteremia   Staphylococcus aureus bacteremia with sepsis (HCC)   Severe sepsis with septic shock (HCC)   Leukocytosis  Rectourethral fistula  Periurethral fistula GI bleed - S/P Laparoscopic creation of an end sigmoid colostomy, Dr. Dema Severin, 10/18 - S/P Drainage of perineal abscess, placement of open suprapubic tube, 23 French Foley, Dr. Diona Fanti, 10/18 - good ostomy output but looks like red blood mixed with stool  MRSA baceriemia - ID involved and appreciate their assistance - vancomycin  FEN: soft diet VTE: SCD's, no chem DVT due to anemia ID: Vanc & Zosyn 10/17>> Foley: yes per urology Follow up: Dr. Dema Severin 2 weeks after discharge, 1st sp tube change in our office in [place] in 6 weeks per Dr. Diona Fanti  DISPO: Pt dropped Hg and getting 2U PRBC, looks like blood mixed with stool in ostomy, do not suspect upper GI bleed. Will discuss need for imaging or GI consult with MD. Last colonoscopy was 2015 for blood in stool and 2 polyps were removed; one from near cecum and one in mid descending colon. Pt may need colonoscopy to evaluate bleeding.     LOS: 10 days    Subjective:  CC: bottom is sore  No abdominal pain, nausea or vomiting. No fever or chills overnight. Pt states he has not been out of bed.   Objective: Vital signs in last 24 hours: Temp:  [99.1 F (37.3 C)-99.5 F (37.5 C)] 99.3 F (37.4 C) (10/22 0448) Pulse Rate:  [67-74] 67 (10/22 0448) Resp:  [18] 18 (10/22 0448) BP: (106-120)/(56-66) 120/66 (10/22 0448) SpO2:  [100 %] 100 % (10/22 0448) Weight:  [186 lb 4.8 oz (84.5 kg)] 186 lb 4.8 oz (84.5 kg) (10/22 0448) Last BM Date: 04/27/17  Intake/Output from previous  day: 10/21 0701 - 10/22 0700 In: 60 [I.V.:10] Out: -  Intake/Output this shift: No intake/output data recorded.  PE: Gen:  Alert, NAD, pleasant, cooperative Pulm:  Rate and effort normal Abd: Soft, NT/ND, +BS, ostomy pink, red tinged soft stool in bag.  Skin: no rashes noted, warm and dry   Anti-infectives: Anti-infectives    Start     Dose/Rate Route Frequency Ordered Stop   04/23/17 2200  piperacillin-tazobactam (ZOSYN) IVPB 3.375 g     3.375 g 12.5 mL/hr over 240 Minutes Intravenous Every 8 hours 04/23/17 2033     04/23/17 1600  vancomycin (VANCOCIN) IVPB 750 mg/150 ml premix     750 mg 150 mL/hr over 60 Minutes Intravenous Every 24 hours 04/23/17 1404     04/23/17 1330  vancomycin (VANCOCIN) IVPB 1000 mg/200 mL premix  Status:  Discontinued     1,000 mg 200 mL/hr over 60 Minutes Intravenous Every 24 hours 04/23/17 0801 04/23/17 1404   04/23/17 1015  cefoTEtan (CEFOTAN) 2 g in dextrose 5 % 50 mL IVPB  Status:  Discontinued     2 g 100 mL/hr over 30 Minutes Intravenous On call to O.R. 04/23/17 1012 04/23/17 1234   04/22/17 1200  vancomycin (VANCOCIN) IVPB 1000 mg/200 mL premix  Status:  Discontinued     1,000 mg 200 mL/hr over 60 Minutes Intravenous Every 24 hours 04/22/17 0800 04/23/17 0801   04/21/17 2200  meropenem (  MERREM) 1 g in sodium chloride 0.9 % 100 mL IVPB  Status:  Discontinued     1 g 200 mL/hr over 30 Minutes Intravenous Every 12 hours 04/21/17 1535 04/23/17 2029   04/21/17 0800  vancomycin (VANCOCIN) IVPB 1000 mg/200 mL premix  Status:  Discontinued     1,000 mg 200 mL/hr over 60 Minutes Intravenous Every 48 hours 04/19/17 1109 04/20/17 1100   04/20/17 1200  vancomycin (VANCOCIN) IVPB 750 mg/150 ml premix  Status:  Discontinued     750 mg 150 mL/hr over 60 Minutes Intravenous Every 24 hours 04/20/17 1100 04/22/17 0800   04/19/17 1000  meropenem (MERREM) 500 mg in sodium chloride 0.9 % 50 mL IVPB  Status:  Discontinued     500 mg 100 mL/hr over 30 Minutes  Intravenous Every 12 hours 04/19/17 0612 04/21/17 1535   04/19/17 0800  vancomycin (VANCOCIN) 500 mg in sodium chloride 0.9 % 100 mL IVPB  Status:  Discontinued     500 mg 100 mL/hr over 60 Minutes Intravenous Every 48 hours 04/19/17 0612 04/19/17 1109   04/18/17 2215  vancomycin (VANCOCIN) IVPB 1000 mg/200 mL premix     1,000 mg 200 mL/hr over 60 Minutes Intravenous  Once 04/18/17 2209 04/18/17 2317   04/18/17 2215  meropenem (MERREM) 1 g in sodium chloride 0.9 % 100 mL IVPB     1 g 200 mL/hr over 30 Minutes Intravenous  Once 04/18/17 2209 04/18/17 2330      Lab Results:   Recent Labs  04/27/17 0434 04/28/17 0349  WBC 10.7* 8.4  HGB 7.6* 6.4*  HCT 24.0* 19.9*  PLT 200 147*   BMET  Recent Labs  04/27/17 0434 04/28/17 0349  NA 137 138  K 3.4* 4.1  CL 113* 115*  CO2 19* 17*  GLUCOSE 103* 110*  BUN 16 11  CREATININE 1.19 1.01  CALCIUM 7.0* 5.4*   PT/INR No results for input(s): LABPROT, INR in the last 72 hours. CMP     Component Value Date/Time   NA 138 04/28/2017 0349   K 4.1 04/28/2017 0349   CL 115 (H) 04/28/2017 0349   CO2 17 (L) 04/28/2017 0349   GLUCOSE 110 (H) 04/28/2017 0349   BUN 11 04/28/2017 0349   CREATININE 1.01 04/28/2017 0349   CREATININE 2.66 (H) 01/27/2015 0832   CALCIUM 5.4 (LL) 04/28/2017 0349   PROT 5.3 (L) 04/23/2017 0356   ALBUMIN 1.2 (L) 04/28/2017 0349   AST 40 04/23/2017 0356   ALT 20 04/23/2017 0356   ALKPHOS 210 (H) 04/23/2017 0356   BILITOT 0.7 04/23/2017 0356   GFRNONAA >60 04/28/2017 0349   GFRAA >60 04/28/2017 0349   Lipase  No results found for: LIPASE  Studies/Results: No results found.    Kalman Drape , [facility] 04/28/2017, 10:11 AM Pager: [phone] Consults: [phone] Mon-Fri 7:00 am-4:30 pm Sat-Sun 7:00 am-11:30 am

## 2017-04-28 NOTE — Progress Notes (Signed)
CRITICAL VALUE ALERT  Critical Value:  Hgb 6.4  Date & Time Notied:  04/28/17  Provider Notified: Schorr  Orders Received/Actions taken:

## 2017-04-28 NOTE — Progress Notes (Signed)
Colin [name]  MVH:[id number] DOB: April 13, 1941 DOA: 04/18/2017 PCP: Elfredia Nevins, MD    Brief Narrative:  76yo male with a Hx of stage 3 CKD, ETOHism, Hepatitis C, HTN, Anemia, Prostate Cancer, and DM2 who presented to [facility] on 10/12 with "black" stool for one month which had progressively worsened over 3 days. Hemoglobin 8.8, WBC 41.2. C.Diff negative, CT A/P with apparent fistula between the prostate gland / prostatic urethra and the anterior wall of the rectum. Foley was placed with black stool output noted. Patient was transferred to [facility] for further management. 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 On 10/22, noted maroon-colored soft stools in colostomy and ABLA, hemoglobin dropped from 7.6 > 6.4, checking bleeding scan.   Significant Events: 10/12 admit through [facility] ED  Subjective: Patient actually denied complaints. No pain reported. Not aware of blood in stools. No dizziness, lightheadedness, chest pain or dyspnea reported.  Assessment & Plan:  Rectourethral fistula General surgery and urology consulted and patient underwent combined suprapubic tube, diverting colostomy for rectourethral fistula with periurethral abscess drainage on 10/18. GI and surgery following. Due to suspected GI bleed, will downgrade diet to clear liquids temporarily.  Periurethral abscess Status post surgical drainage in OR 10/18. As per ID follow-up, plan to treat for 14 days with Zosyn using 10/18 as day 1. Left upper arm midline placed 10/17. As per urology follow-up 10/21, diligent wound care to the perineal abscess is recommended and he discussed with RN. Urology will follow.  GI Bleed - Acute blood loss anemia  Follow CBCs closely and transfuse if hemoglobin is 7 or less. S/P 1 PRBC at surgery on 10/18. Post Hb 8.5 >8.2 >7.9 >7.6. Hemoglobin dropped from 7.6 yesterday to 6.4 today and noted maroon  colored soft stool in colostomy. As discussed with general surgery, check nuclear bleeding scan, doubt a brisk bleed and if negative then GI consultation (his primary GI are in [place]). Suspect lower GI bleed? Related to recent colostomy.  MRSA bacteremia  ID directing care of this issue . As per ID follow-up, plan to treat minimum of 14 days using 10/18 as day 1.  Hypokalemia  Replace and follow as needed  Non-anion gap metabolic acidosis Due to renal failure as well as possible GI bicarb losses. Started oral sodium bicarbonate tablets 10/17, likely not enough time to act. Follow BMP closely. Continues to gradually improve/fluctuating.  Acute encephalopathy/? Portosystemic - As per chart review, not sure how much alcohol he uses. Concern for thiamine and folate deficiency, ordered supplements. Improved.  Hypernatremia  Reflective of free water deficit - corrected w/ free water resuscitation    Elevated Troponin in setting of demand ischemia  Troponin never signif elevated   HTN BP currently well controlled   AKI on CKD Stage 3 baseline Crt 1.4-1.8. Creatinine has now normalized.  Liver Cirrhosis - Chronic Hep C   ?Cellulitis to Right LE  No evidence on exam today to suggest an active infection - B LE venous duplex w/o DVT on 04/19/17. Stable without change.  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 Corrected serum calcium is 7.6.  DVT prophylaxis: SCDs Code Status: FULL CODE Family Communication: no family present at time of exam  Disposition Plan: Patient not medically stable for discharge. DC to a SNF when  stable.   Consultants:  PCCM Gen Surgery Urology  Nephrology  ID  Antimicrobials:  Vancomycin 10/12 > Meropenem 10/12 >10/17 Zosyn 10/17>   Objective: Vitals:   04/28/17 1334 04/28/17 1346 04/28/17 1419 04/28/17 1441  BP: 114/69 111/68 124/68 111/65  Pulse: 73 78 86 78  Resp: 19  18 (!) 22  Temp: 98.1 F (36.7 C) 98.2 F (36.8 C) 98.8 F (37.1 C) 98.8 F (37.1 C)  TempSrc: Oral Oral Oral Oral  SpO2: 100%  92% 100%  Weight:      Height:          Intake/Output Summary (Last 24 hours) at 04/28/17 1551 Last data filed at 04/28/17 1419  Gross per 24 hour  Intake             1160 ml  Output             1175 ml  Net              -15 ml   Filed Weights   04/26/17 0547 04/27/17 0525 04/28/17 0448  Weight: 80.9 kg (178 lb 6.4 oz) 82.7 kg (182 lb 6.4 oz) 84.5 kg (186 lb 4.8 oz)    Examination: General: Elderly male, moderately built and nourished, lying comfortably propped up in bed.Does not appear in any distress. Lungs: Clear to auscultation. No increased work of breathing. Cardiovascular: S1 and S2 heard, RRR. No JVD, murmurs or pedal edema. Abdomen: Nondistended, soft and nontender. Suprapubic catheter intact and draining clear straw-colored urine without gross hematuria. Colostomy intact and shows small amount of soft maroon-colored stools. Extremities: Moving all limbs symmetrically. No cyanosis clubbing or edema.stable CNS: Alert and oriented 2. No focal neurological deficits. Stable  CBC:  Recent Labs Lab 04/23/17 0356 04/24/17 1408 04/25/17 0433 04/26/17 0425 04/27/17 0434 04/28/17 0349  WBC 17.7*  --  18.2* 12.7* 10.7* 8.4  HGB 7.6* 8.5* 8.2* 7.9* 7.6* 6.4*  HCT 23.4* 27.0* 25.2* 24.0* 24.0* 19.9*  MCV 83.3  --  84.0 85.7 85.4 86.1  PLT 289  --  212 202 200 147*   Basic Metabolic Panel:  Recent Labs Lab 04/23/17 0356 04/24/17 0614 04/25/17 0433 04/26/17 0425 04/27/17 0434 04/28/17 0349  NA 137 135 137 136 137 138  K 4.2 4.1 3.9 3.5 3.4*  4.1  CL 116* 114* 112* 110 113* 115*  CO2 13* 13* 15* 17* 19* 17*  GLUCOSE 90 102* 147* 92 103* 110*  BUN 56* 37* 29* 21* 16 11  CREATININE 1.25* 1.19 1.27* 1.25* 1.19 1.01  CALCIUM 7.7* 7.3* 7.3* 7.0* 7.0* 5.4*  MG 1.6* 1.9  --   --   --   --   PHOS <1.0* 2.0* 3.7  --   --  2.0*   GFR: Estimated Creatinine Clearance: 64.4 mL/min (by C-G formula based on SCr of 1.01 mg/dL).  Liver Function Tests:  Recent Labs Lab 04/22/17 0255 04/23/17 0356 04/25/17 0433 04/28/17 0349  AST 33 40  --   --   ALT 22 20  --   --   ALKPHOS 214* 210*  --   --   BILITOT 0.7 0.7  --   --   PROT 5.5* 5.3*  --   --   ALBUMIN 1.9* 1.8*  1.9* 1.2*   No results for input(s): AMMONIA in the last 168 hours.  Coagulation Profile:  Recent Labs Lab 04/23/17 0356  INR 1.23    Cardiac Enzymes: No results for input(s): CKTOTAL, CKMB, CKMBINDEX, TROPONINI in the last 168 hours.   CBG:  Recent Labs Lab 04/21/17 2006 04/21/17 2326 04/24/17 1204  GLUCAP 130* 115* 154*    Recent Results (from the past 240 hour(s))  Blood Culture (routine x 2)     Status: Abnormal   Collection Time: 04/18/17  7:09 PM  Result Value Ref Range Status   Specimen Description RIGHT ANTECUBITAL  Final   Special Requests   Final    BOTTLES DRAWN AEROBIC AND ANAEROBIC Blood Culture adequate volume   Culture  Setup Time   Final    GRAM POSITIVE COCCI AEROBIC BOTTLE ONLY Gram Stain Report Called to,Read Back By and Verified With: EVERETT,R @1604  BY MATTHEWS, B 10.13.18 Performed at [facility] CRITICAL RESULT CALLED TO, READ BACK BY AND VERIFIED WITH: Verneita Griffes RUMBARGER [phone] MLM Performed at [facility], [address], [place], [place] [address]    Culture METHICILLIN RESISTANT STAPHYLOCOCCUS AUREUS (A)  Final   Report Status 04/21/2017 FINAL  Final   Organism ID, Bacteria METHICILLIN RESISTANT STAPHYLOCOCCUS AUREUS  Final      Susceptibility   Methicillin resistant staphylococcus aureus - MIC*     CIPROFLOXACIN >=8 RESISTANT Resistant     ERYTHROMYCIN >=8 RESISTANT Resistant     GENTAMICIN <=0.5 SENSITIVE Sensitive     OXACILLIN >=4 RESISTANT Resistant     TETRACYCLINE <=1 SENSITIVE Sensitive     VANCOMYCIN 1 SENSITIVE Sensitive     TRIMETH/SULFA <=10 SENSITIVE Sensitive     CLINDAMYCIN >=8 RESISTANT Resistant     RIFAMPIN <=0.5 SENSITIVE Sensitive     Inducible Clindamycin NEGATIVE Sensitive     * METHICILLIN RESISTANT STAPHYLOCOCCUS AUREUS  Blood Culture ID Panel (Reflexed)     Status: Abnormal   Collection Time: 04/18/17  7:09 PM  Result Value Ref Range Status   Enterococcus species NOT DETECTED NOT DETECTED Final   Listeria monocytogenes NOT DETECTED NOT DETECTED Final   Staphylococcus species DETECTED (A) NOT DETECTED Final    Comment: CRITICAL RESULT CALLED TO, READ BACK BY AND VERIFIED WITH: PHARMD R RUMBARGER [phone] MLM    Staphylococcus aureus DETECTED (A) NOT DETECTED Final    Comment: Methicillin (oxacillin)-resistant Staphylococcus aureus (MRSA). MRSA is predictably resistant to beta-lactam antibiotics (except ceftaroline). Preferred therapy is vancomycin unless clinically contraindicated. Patient requires contact precautions if  hospitalized. CRITICAL RESULT CALLED TO, READ BACK BY AND VERIFIED WITH: PHARMD R RUMBARGER [phone] MLM    Methicillin resistance DETECTED (A) NOT DETECTED Final    Comment: CRITICAL RESULT CALLED TO, READ BACK BY AND VERIFIED WITH: PHARMD R RUMBARGER [phone] MLM    Streptococcus species NOT DETECTED NOT DETECTED Final   Streptococcus agalactiae NOT DETECTED NOT DETECTED Final   Streptococcus pneumoniae NOT DETECTED NOT DETECTED Final   Streptococcus pyogenes NOT DETECTED NOT DETECTED Final   Acinetobacter baumannii NOT DETECTED NOT DETECTED Final   Enterobacteriaceae species NOT DETECTED NOT DETECTED Final   Enterobacter cloacae complex NOT DETECTED NOT DETECTED Final   Escherichia coli NOT DETECTED NOT DETECTED Final    Klebsiella oxytoca NOT DETECTED NOT DETECTED Final   Klebsiella pneumoniae NOT DETECTED NOT DETECTED Final   Proteus species NOT DETECTED NOT DETECTED Final   Serratia marcescens NOT DETECTED NOT DETECTED Final  Haemophilus influenzae NOT DETECTED NOT DETECTED Final   Neisseria meningitidis NOT DETECTED NOT DETECTED Final   Pseudomonas aeruginosa NOT DETECTED NOT DETECTED Final   Candida albicans NOT DETECTED NOT DETECTED Final   Candida glabrata NOT DETECTED NOT DETECTED Final   Candida krusei NOT DETECTED NOT DETECTED Final   Candida parapsilosis NOT DETECTED NOT DETECTED Final   Candida tropicalis NOT DETECTED NOT DETECTED Final    Comment: Performed at [facility], [address] [place], [place] [address]  Gastrointestinal Panel by PCR , Stool     Status: None   Collection Time: 04/18/17  9:05 PM  Result Value Ref Range Status   Campylobacter species NOT DETECTED NOT DETECTED Final   Plesimonas shigelloides NOT DETECTED NOT DETECTED Final   Salmonella species NOT DETECTED NOT DETECTED Final   Yersinia enterocolitica NOT DETECTED NOT DETECTED Final   Vibrio species NOT DETECTED NOT DETECTED Final   Vibrio cholerae NOT DETECTED NOT DETECTED Final   Enteroaggregative E coli (EAEC) NOT DETECTED NOT DETECTED Final   Enteropathogenic E coli (EPEC) NOT DETECTED NOT DETECTED Final   Enterotoxigenic E coli (ETEC) NOT DETECTED NOT DETECTED Final   Shiga like toxin producing E coli (STEC) NOT DETECTED NOT DETECTED Final   Shigella/Enteroinvasive E coli (EIEC) NOT DETECTED NOT DETECTED Final   Cryptosporidium NOT DETECTED NOT DETECTED Final   Cyclospora cayetanensis NOT DETECTED NOT DETECTED Final   Entamoeba histolytica NOT DETECTED NOT DETECTED Final   Giardia lamblia NOT DETECTED NOT DETECTED Final   Adenovirus F40/41 NOT DETECTED NOT DETECTED Final   Astrovirus NOT DETECTED NOT DETECTED Final   Norovirus GI/GII NOT DETECTED NOT DETECTED Final   Rotavirus A NOT DETECTED NOT  DETECTED Final   Sapovirus (I, II, IV, and V) NOT DETECTED NOT DETECTED Final  C difficile quick scan w PCR reflex     Status: None   Collection Time: 04/18/17  9:05 PM  Result Value Ref Range Status   C Diff antigen NEGATIVE NEGATIVE Final   C Diff toxin NEGATIVE NEGATIVE Final   C Diff interpretation No C. difficile detected.  Final    Comment: VALID  Blood Culture (routine x 2)     Status: None   Collection Time: 04/18/17 10:26 PM  Result Value Ref Range Status   Specimen Description RIGHT ANTECUBITAL  Final   Special Requests   Final    BOTTLES DRAWN AEROBIC ONLY Blood Culture adequate volume   Culture NO GROWTH 5 DAYS  Final   Report Status 04/23/2017 FINAL  Final  MRSA PCR Screening     Status: None   Collection Time: 04/19/17  3:06 AM  Result Value Ref Range Status   MRSA by PCR NEGATIVE NEGATIVE Final    Comment:        The GeneXpert MRSA Assay (FDA approved for NASAL specimens only), is one component of a comprehensive MRSA colonization surveillance program. It is not intended to diagnose MRSA infection nor to guide or monitor treatment for MRSA infections.   Culture, blood (Routine X 2) w Reflex to ID Panel     Status: None   Collection Time: 04/20/17 11:35 AM  Result Value Ref Range Status   Specimen Description BLOOD BLOOD RIGHT HAND  Final   Special Requests   Final    BOTTLES DRAWN AEROBIC AND ANAEROBIC Blood Culture adequate volume   Culture NO GROWTH 5 DAYS  Final   Report Status 04/25/2017 FINAL  Final  Culture, blood (Routine X  2) w Reflex to ID Panel     Status: None   Collection Time: 04/20/17 11:41 AM  Result Value Ref Range Status   Specimen Description BLOOD BLOOD LEFT HAND  Final   Special Requests IN PEDIATRIC BOTTLE Blood Culture adequate volume  Final   Culture NO GROWTH 5 DAYS  Final   Report Status 04/25/2017 FINAL  Final     Scheduled Meds: . Chlorhexidine Gluconate Cloth  6 each Topical Once  . feeding supplement (ENSURE ENLIVE)  237  mL Oral BID BM  . folic acid  1 mg Oral Daily  . multivitamin with minerals  1 tablet Oral Daily  . sodium bicarbonate  1,300 mg Oral BID  . thiamine  100 mg Oral Daily     LOS: 10 days   Oluwasemilore Pascuzzi, MD, FACP, FHM. [facility] Pager [phone]  If 7PM-7AM, please contact night-coverage [email or web address] Password [email or web address] 04/28/2017, 3:51 PM

## 2017-04-28 NOTE — Progress Notes (Addendum)
[place] for Infectious Disease    Date of Admission:  04/18/2017   Total days of antibiotics 12           ID: Colin [name] is a 76 y.o. male with  vanco/piptazo Active Problems:   Altered mental status   Acute renal failure (HCC)   Metabolic acidosis   Melena   Diarrhea in adult patient   Heme positive stool   Perineal abscess   Recto-prostatic fistula   MRSA bacteremia   Staphylococcus aureus bacteremia with sepsis (HCC)   Severe sepsis with septic shock (HCC)   Leukocytosis    Subjective: Afebrile but found to have drop in Hgb which corresponds to maroon stool in ostomy. Getting rbc transfusion  Medications:  . Chlorhexidine Gluconate Cloth  6 each Topical Once  . feeding supplement (ENSURE ENLIVE)  237 mL Oral BID BM  . folic acid  1 mg Oral Daily  . multivitamin with minerals  1 tablet Oral Daily  . sodium bicarbonate  1,300 mg Oral BID  . thiamine  100 mg Oral Daily    Objective: Vital signs in last 24 hours: Temp:  [98.1 F (36.7 C)-99.5 F (37.5 C)] 98.6 F (37 C) (10/22 1645) Pulse Rate:  [67-86] 68 (10/22 1645) Resp:  [16-22] 20 (10/22 1645) BP: (94-124)/(58-72) 114/72 (10/22 1645) SpO2:  [92 %-100 %] 94 % (10/22 1645) Weight:  [186 lb 4.8 oz (84.5 kg)] 186 lb 4.8 oz (84.5 kg) (10/22 0448) Physical Exam  Constitutional: He is oriented to person, place, and time. He appears well-developed and well-nourished. No distress.  HENT:  Mouth/Throat: Oropharynx is clear and moist. No oropharyngeal exudate.  Cardiovascular: Normal rate, regular rhythm and normal heart sounds. Exam reveals no gallop and no friction rub.  No murmur heard.  Pulmonary/Chest: Effort normal and breath sounds normal. No respiratory distress. He has no wheezes.  Abdominal: Soft. Bowel sounds are normal. He exhibits no distension. Ostomy in place, wound vac Neurological: He is alert and oriented to person, place, and time.  Skin: Skin is warm and dry. No rash noted. No  erythema.  Psychiatric: He has a normal mood and affect. His behavior is normal.   Lab Results  Recent Labs  04/27/17 0434 04/28/17 0349  WBC 10.7* 8.4  HGB 7.6* 6.4*  HCT 24.0* 19.9*  NA 137 138  K 3.4* 4.1  CL 113* 115*  CO2 19* 17*  BUN 16 11  CREATININE 1.19 1.01   Liver Panel  Recent Labs  04/28/17 0349  ALBUMIN 1.2*   Lab Results  Component Value Date   ESRSEDRATE 38 (H) 02/21/2010   Lab Results  Component Value Date   CRP 0.2 02/21/2010    Microbiology: 10/14 blood cx ngtd 10/12 blood cx 1 of 4 bottles with MRSA Studies/Results: No results found.    Assessment/Plan: Rectourethral fistula and Periurethral fistula c/b MRSA bacteremia GI bleed - S/P Laparoscopic creation of an end sigmoid colostomy, Dr. Dema Severin, 10/18 - S/P Drainage of perineal abscess, placement of open suprapubic tube, 37 French Foley, Dr. Diona Fanti, 10/18  getting rbc transfusion  MRSA bacteremia = plan to treat for 14 days using 10/19 as day 1.  Perineal abscess  S/p drainage = plan to treat for 14 days using 10/19 as day 1.  Will sign off.  See home health orders listed below if needed for dispo  ------------------- Diagnosis: mrsa bacteremia  Culture Result: MRSA  No Known Allergies  OPAT Orders Discharge antibiotics: Per  pharmacy protocol - vanco plus piptazo Aim for Vancomycin trough 15-20 (unless otherwise indicated) Duration: 14 days End Date:  Nov 3rd  [place] Per Protocol:  Labs weekly while on IV antibiotics: _x_ CBC with differential _x_ BMP  _x_ Vancomycin trough  __x Please pull PIC at completion of IV antibiotics Fax weekly labs to [phone]  Clinic Follow Up Appt: 2-3 wk  @ [place], [facility] for Infectious Diseases Cell: [phone] Pager: [phone]  04/28/2017, 5:23 PM

## 2017-04-28 NOTE — Progress Notes (Signed)
4 Days Post-Op Subjective: Patient reports some pain in perineum.  Objective: Vital signs in last 24 hours: Temp:  [99.1 F (37.3 C)-99.5 F (37.5 C)] 99.3 F (37.4 C) (10/22 0448) Pulse Rate:  [67-74] 67 (10/22 0448) Resp:  [18] 18 (10/22 0448) BP: (106-120)/(56-66) 120/66 (10/22 0448) SpO2:  [100 %] 100 % (10/22 0448) Weight:  [84.5 kg (186 lb 4.8 oz)] 84.5 kg (186 lb 4.8 oz) (10/22 0448)  Intake/Output from previous day: 10/21 0701 - 10/22 0700 In: 60 [I.V.:10] Out: -  Intake/Output this shift: No intake/output data recorded.  Physical Exam:  Constitutional: Vital signs reviewed. WD WN in NAD   Eyes: PERRL, No scleral icterus.   Cardiovascular: RRR Pulmonary/Chest: Normal effort Abdominal: Soft. Non-tender, non-distended, bowel sounds are normal, no masses, organomegaly, or guarding present.  Genitourinary: less scrotal edema. Perineal wound w/ less drainage Extremities: No cyanosis or edema   Lab Results:  Recent Labs  04/26/17 0425 04/27/17 0434 04/28/17 0349  HGB 7.9* 7.6* 6.4*  HCT 24.0* 24.0* 19.9*   BMET  Recent Labs  04/27/17 0434 04/28/17 0349  NA 137 138  K 3.4* 4.1  CL 113* 115*  CO2 19* 17*  GLUCOSE 103* 110*  BUN 16 11  CREATININE 1.19 1.01  CALCIUM 7.0* 5.4*   No results for input(s): LABPT, INR in the last 72 hours. No results for input(s): LABURIN in the last 72 hours. Results for orders placed or performed during the hospital encounter of 04/18/17  Blood Culture (routine x 2)     Status: Abnormal   Collection Time: 04/18/17  7:09 PM  Result Value Ref Range Status   Specimen Description RIGHT ANTECUBITAL  Final   Special Requests   Final    BOTTLES DRAWN AEROBIC AND ANAEROBIC Blood Culture adequate volume   Culture  Setup Time   Final    GRAM POSITIVE COCCI AEROBIC BOTTLE ONLY Gram Stain Report Called to,Read Back By and Verified With: EVERETT,R @1604  BY MATTHEWS, B 10.13.18 Performed at [facility], READ BACK BY AND VERIFIED WITH: Juanell Fairly [id number] MLM Performed at [facility], [place] [address] [place], [place] [address]    Culture METHICILLIN RESISTANT STAPHYLOCOCCUS AUREUS (A)  Final   Report Status 04/21/2017 FINAL  Final   Organism ID, Bacteria METHICILLIN RESISTANT STAPHYLOCOCCUS AUREUS  Final      Susceptibility   Methicillin resistant staphylococcus aureus - MIC*    CIPROFLOXACIN >=8 RESISTANT Resistant     ERYTHROMYCIN >=8 RESISTANT Resistant     GENTAMICIN <=0.5 SENSITIVE Sensitive     OXACILLIN >=4 RESISTANT Resistant     TETRACYCLINE <=1 SENSITIVE Sensitive     VANCOMYCIN 1 SENSITIVE Sensitive     TRIMETH/SULFA <=10 SENSITIVE Sensitive     CLINDAMYCIN >=8 RESISTANT Resistant     RIFAMPIN <=0.5 SENSITIVE Sensitive     Inducible Clindamycin NEGATIVE Sensitive     * METHICILLIN RESISTANT STAPHYLOCOCCUS AUREUS  Blood Culture ID Panel (Reflexed)     Status: Abnormal   Collection Time: 04/18/17  7:09 PM  Result Value Ref Range Status   Enterococcus species NOT DETECTED NOT DETECTED Final   Listeria monocytogenes NOT DETECTED NOT DETECTED Final   Staphylococcus species DETECTED (A) NOT DETECTED Final    Comment: CRITICAL RESULT CALLED TO, READ BACK BY AND VERIFIED WITH: PHARMD R RUMBARGER [phone] MLM    Staphylococcus aureus DETECTED (A) NOT DETECTED Final    Comment: Methicillin (oxacillin)-resistant Staphylococcus aureus (  MRSA). MRSA is predictably resistant to beta-lactam antibiotics (except ceftaroline). Preferred therapy is vancomycin unless clinically contraindicated. Patient requires contact precautions if  hospitalized. CRITICAL RESULT CALLED TO, READ BACK BY AND VERIFIED WITH: PHARMD R RUMBARGER [phone] MLM    Methicillin resistance DETECTED (A) NOT DETECTED Final    Comment: CRITICAL RESULT CALLED TO, READ BACK BY AND VERIFIED WITH: PHARMD R RUMBARGER [phone] MLM    Streptococcus species NOT DETECTED NOT DETECTED Final    Streptococcus agalactiae NOT DETECTED NOT DETECTED Final   Streptococcus pneumoniae NOT DETECTED NOT DETECTED Final   Streptococcus pyogenes NOT DETECTED NOT DETECTED Final   Acinetobacter baumannii NOT DETECTED NOT DETECTED Final   Enterobacteriaceae species NOT DETECTED NOT DETECTED Final   Enterobacter cloacae complex NOT DETECTED NOT DETECTED Final   Escherichia coli NOT DETECTED NOT DETECTED Final   Klebsiella oxytoca NOT DETECTED NOT DETECTED Final   Klebsiella pneumoniae NOT DETECTED NOT DETECTED Final   Proteus species NOT DETECTED NOT DETECTED Final   Serratia marcescens NOT DETECTED NOT DETECTED Final   Haemophilus influenzae NOT DETECTED NOT DETECTED Final   Neisseria meningitidis NOT DETECTED NOT DETECTED Final   Pseudomonas aeruginosa NOT DETECTED NOT DETECTED Final   Candida albicans NOT DETECTED NOT DETECTED Final   Candida glabrata NOT DETECTED NOT DETECTED Final   Candida krusei NOT DETECTED NOT DETECTED Final   Candida parapsilosis NOT DETECTED NOT DETECTED Final   Candida tropicalis NOT DETECTED NOT DETECTED Final    Comment: Performed at [facility], [place]. [address] [place], [place] [address]  Gastrointestinal Panel by PCR , Stool     Status: None   Collection Time: 04/18/17  9:05 PM  Result Value Ref Range Status   Campylobacter species NOT DETECTED NOT DETECTED Final   Plesimonas shigelloides NOT DETECTED NOT DETECTED Final   Salmonella species NOT DETECTED NOT DETECTED Final   Yersinia enterocolitica NOT DETECTED NOT DETECTED Final   Vibrio species NOT DETECTED NOT DETECTED Final   Vibrio cholerae NOT DETECTED NOT DETECTED Final   Enteroaggregative E coli (EAEC) NOT DETECTED NOT DETECTED Final   Enteropathogenic E coli (EPEC) NOT DETECTED NOT DETECTED Final   Enterotoxigenic E coli (ETEC) NOT DETECTED NOT DETECTED Final   Shiga like toxin producing E coli (STEC) NOT DETECTED NOT DETECTED Final   Shigella/Enteroinvasive E coli (EIEC) NOT DETECTED NOT  DETECTED Final   Cryptosporidium NOT DETECTED NOT DETECTED Final   Cyclospora cayetanensis NOT DETECTED NOT DETECTED Final   Entamoeba histolytica NOT DETECTED NOT DETECTED Final   Giardia lamblia NOT DETECTED NOT DETECTED Final   Adenovirus F40/41 NOT DETECTED NOT DETECTED Final   Astrovirus NOT DETECTED NOT DETECTED Final   Norovirus GI/GII NOT DETECTED NOT DETECTED Final   Rotavirus A NOT DETECTED NOT DETECTED Final   Sapovirus (I, II, IV, and V) NOT DETECTED NOT DETECTED Final  C difficile quick scan w PCR reflex     Status: None   Collection Time: 04/18/17  9:05 PM  Result Value Ref Range Status   C Diff antigen NEGATIVE NEGATIVE Final   C Diff toxin NEGATIVE NEGATIVE Final   C Diff interpretation No C. difficile detected.  Final    Comment: VALID  Blood Culture (routine x 2)     Status: None   Collection Time: 04/18/17 10:26 PM  Result Value Ref Range Status   Specimen Description RIGHT ANTECUBITAL  Final   Special Requests   Final    BOTTLES DRAWN AEROBIC ONLY  Blood Culture adequate volume   Culture NO GROWTH 5 DAYS  Final   Report Status 04/23/2017 FINAL  Final  MRSA PCR Screening     Status: None   Collection Time: 04/19/17  3:06 AM  Result Value Ref Range Status   MRSA by PCR NEGATIVE NEGATIVE Final    Comment:        The GeneXpert MRSA Assay (FDA approved for NASAL specimens only), is one component of a comprehensive MRSA colonization surveillance program. It is not intended to diagnose MRSA infection nor to guide or monitor treatment for MRSA infections.   Culture, blood (Routine X 2) w Reflex to ID Panel     Status: None   Collection Time: 04/20/17 11:35 AM  Result Value Ref Range Status   Specimen Description BLOOD BLOOD RIGHT HAND  Final   Special Requests   Final    BOTTLES DRAWN AEROBIC AND ANAEROBIC Blood Culture adequate volume   Culture NO GROWTH 5 DAYS  Final   Report Status 04/25/2017 FINAL  Final  Culture, blood (Routine X 2) w Reflex to ID Panel      Status: None   Collection Time: 04/20/17 11:41 AM  Result Value Ref Range Status   Specimen Description BLOOD BLOOD LEFT HAND  Final   Special Requests IN PEDIATRIC BOTTLE Blood Culture adequate volume  Final   Culture NO GROWTH 5 DAYS  Final   Report Status 04/25/2017 FINAL  Final    Studies/Results: No results found.  Assessment/Plan:   POD 4 sp tube placement/colostomy/perineal wound drainage. Stable. Colon fxn nml. Pt anemic.  Cont dressing changes. He'll need 1st sp tube change in our office in [place] in 6 weeks.   LOS: 10 days   Franchot Gallo M 04/28/2017, 8:00 AM

## 2017-04-28 NOTE — Progress Notes (Signed)
Bedside hemoccult test postitive.

## 2017-04-28 NOTE — Progress Notes (Signed)
PT Cancellation Note  Patient Details Name: Colin [name] MRN: [id number] DOB: 10/13/1940   Cancelled Treatment:    Reason Eval/Treat Not Completed: (P) Medical issues which prohibited therapy (Pt presents with soft BPs, and HGb 6.4 with blood tranfusion on board and plans for 2 units total.  Pt with signs of blood in stool per [facility] nurse.  Will cancel tx for today.  )   Daisuke Bailey Eli Hose 04/28/2017, 2:06 PM  [place] [place], PTA pager [phone]

## 2017-04-28 NOTE — Progress Notes (Signed)
OT Cancellation Note  Patient Details Name: Colin [name] MRN: [id number] DOB: [birth year]   Cancelled Treatment:    Reason Eval/Treat Not Completed: Medical issues which prohibited therapy (hypotension and hgb of 6.4. Will follow.)  Malka So 04/28/2017, 3:39 PM

## 2017-04-28 NOTE — Progress Notes (Signed)
CRITICAL VALUE ALERT  Critical Value: Calcium 5.4  Date & Time Notied:  04/28/17 8721  Provider Notified: Schorr  Orders Received/Actions taken:

## 2017-04-29 DIAGNOSIS — K909 Intestinal malabsorption, unspecified: Secondary | ICD-10-CM

## 2017-04-29 DIAGNOSIS — R197 Diarrhea, unspecified: Secondary | ICD-10-CM

## 2017-04-29 DIAGNOSIS — D649 Anemia, unspecified: Secondary | ICD-10-CM

## 2017-04-29 DIAGNOSIS — K625 Hemorrhage of anus and rectum: Secondary | ICD-10-CM

## 2017-04-29 LAB — TYPE AND SCREEN
ABO/RH(D): O POS
Antibody Screen: NEGATIVE
Unit division: 0
Unit division: 0

## 2017-04-29 LAB — COMPREHENSIVE METABOLIC PANEL
ALT: 32 U/L (ref 17–63)
ANION GAP: 7 (ref 5–15)
AST: 50 U/L — AB (ref 15–41)
Albumin: 1.6 g/dL — ABNORMAL LOW (ref 3.5–5.0)
Alkaline Phosphatase: 252 U/L — ABNORMAL HIGH (ref 38–126)
BILIRUBIN TOTAL: 0.7 mg/dL (ref 0.3–1.2)
BUN: 13 mg/dL (ref 6–20)
CHLORIDE: 110 mmol/L (ref 101–111)
CO2: 19 mmol/L — ABNORMAL LOW (ref 22–32)
Calcium: 6.9 mg/dL — ABNORMAL LOW (ref 8.9–10.3)
Creatinine, Ser: 1.38 mg/dL — ABNORMAL HIGH (ref 0.61–1.24)
GFR, EST AFRICAN AMERICAN: 56 mL/min — AB (ref >60)
GFR, EST NON AFRICAN AMERICAN: 48 mL/min — AB (ref >60)
Glucose, Bld: 101 mg/dL — ABNORMAL HIGH (ref 65–99)
POTASSIUM: 3.7 mmol/L (ref 3.5–5.1)
Sodium: 136 mmol/L (ref 135–145)
TOTAL PROTEIN: 4.7 g/dL — AB (ref 6.5–8.1)

## 2017-04-29 LAB — BPAM RBC
Blood Product Expiration Date: 201811132359
Blood Product Expiration Date: 201811152359
ISSUE DATE / TIME: 201810221036
ISSUE DATE / TIME: 201810221400
Unit Type and Rh: 5100
Unit Type and Rh: 5100

## 2017-04-29 LAB — CBC
HEMATOCRIT: 28 % — AB (ref 39.0–52.0)
HEMOGLOBIN: 9 g/dL — AB (ref 13.0–17.0)
MCH: 27.2 pg (ref 26.0–34.0)
MCHC: 32.1 g/dL (ref 30.0–36.0)
MCV: 84.6 fL (ref 78.0–100.0)
Platelets: 198 10*3/uL (ref 150–400)
RBC: 3.31 MIL/uL — AB (ref 4.22–5.81)
RDW: 20.1 % — ABNORMAL HIGH (ref 11.5–15.5)
WBC: 11.8 10*3/uL — AB (ref 4.0–10.5)

## 2017-04-29 LAB — PHOSPHORUS: Phosphorus: 3.2 mg/dL (ref 2.5–4.6)

## 2017-04-29 LAB — MAGNESIUM: MAGNESIUM: 1.3 mg/dL — AB (ref 1.7–2.4)

## 2017-04-29 NOTE — Progress Notes (Signed)
[facility]/Trauma Progress Note  5 Days Post-Op   Assessment/Plan Active Problems:   Altered mental status   Acute renal failure (HCC)   Metabolic acidosis   Melena   Diarrhea in adult patient   Heme positive stool   Perineal abscess   Recto-prostatic fistula   MRSA bacteremia   Staphylococcus aureus bacteremia with sepsis (HCC)   Severe sepsis with septic shock (HCC)   Leukocytosis  Rectourethral fistula  Periurethral fistula GI bleed - S/P Laparoscopic creation of an end sigmoid colostomy, Dr. Dema Severin, 10/18 - S/P Drainage of perineal abscess, placement of open suprapubic tube, 17 French Foley, Dr. Diona Fanti, 10/18 - good ostomy output, tolerating diet - wet to dry dressing changes BID to perineall wound  Acute blood loss anemia, possible GI bleed - Pt dropped Hg and got 2U PRBC 10/22, blood in stool 10/22 - no blood noted in stool today - Last colonoscopy was 2015 for blood in stool and 2 polyps were removed; one from near cecum and one in mid descending colon. - GI consulted  MRSA baceriemia - ID involved and appreciate their assistance - vancomycin  FEN: soft diet VTE: SCD's, no chem DVT due to anemia, HG stable at 9.0  ID: Vanc & Zosyn 10/17>> Foley: yes per urology Follow up: Dr. Dema Severin 2 weeks after discharge, 1st sp tube change in our office in [place] in 6 weeks per Dr. Diona Fanti  DISPO:  Nec med scan neg for acute GI bleed, GI consulted per medicine for GI bleed.     LOS: 11 days    Subjective:  CC; abdominal pain and R leg pain  Pt complaining of pain around suprapubic cath and R leg pain which is chronic. No nausea or vomiting. Tolerating diet.   Objective: Vital signs in last 24 hours: Temp:  [98.1 F (36.7 C)-98.8 F (37.1 C)] 98.4 F (36.9 C) (10/23 0437) Pulse Rate:  [68-86] 78 (10/23 0437) Resp:  [16-22] 19 (10/22 2030) BP: (94-124)/(58-72) 120/70 (10/23 0437) SpO2:  [92 %-100 %] 96 % (10/23 0437) Weight:  [84.4 kg  (186 lb 1.6 oz)] 84.4 kg (186 lb 1.6 oz) (10/23 0437) Last BM Date: 04/29/17  Intake/Output from previous day: 10/22 0701 - 10/23 0700 In: 2356.7 [P.O.:720; I.V.:510; Blood:620; IV Piggyback:506.7] Out: 2425 [Urine:2375; Stool:50] Intake/Output this shift: No intake/output data recorded.  PE: Gen:  Alert, NAD, pleasant, cooperative Pulm:  Rate and effort normal Abd: Soft, not distened, +BS, ostomy pink, brown soft stool in bag, no blood noted, TTP in area of suprapubic cath  Skin: no rashes noted, warm and dry GU: wound appears well healing without purulent drainage, possible yeast infection in perineal area, skin appears red and irritated    Perineal wound   Anti-infectives: Anti-infectives    Start     Dose/Rate Route Frequency Ordered Stop   04/23/17 2200  piperacillin-tazobactam (ZOSYN) IVPB 3.375 g     3.375 g 12.5 mL/hr over 240 Minutes Intravenous Every 8 hours 04/23/17 2033     04/23/17 1600  vancomycin (VANCOCIN) IVPB 750 mg/150 ml premix     750 mg 150 mL/hr over 60 Minutes Intravenous Every 24 hours 04/23/17 1404     04/23/17 1330  vancomycin (VANCOCIN) IVPB 1000 mg/200 mL premix  Status:  Discontinued     1,000 mg 200 mL/hr over 60 Minutes Intravenous Every 24 hours 04/23/17 0801 04/23/17 1404   04/23/17 1015  cefoTEtan (CEFOTAN) 2 g in dextrose 5 % 50 mL IVPB  Status:  Discontinued  2 g 100 mL/hr over 30 Minutes Intravenous On call to O.R. 04/23/17 1012 04/23/17 1234   04/22/17 1200  vancomycin (VANCOCIN) IVPB 1000 mg/200 mL premix  Status:  Discontinued     1,000 mg 200 mL/hr over 60 Minutes Intravenous Every 24 hours 04/22/17 0800 04/23/17 0801   04/21/17 2200  meropenem (MERREM) 1 g in sodium chloride 0.9 % 100 mL IVPB  Status:  Discontinued     1 g 200 mL/hr over 30 Minutes Intravenous Every 12 hours 04/21/17 1535 04/23/17 2029   04/21/17 0800  vancomycin (VANCOCIN) IVPB 1000 mg/200 mL premix  Status:  Discontinued     1,000 mg 200 mL/hr over 60 Minutes  Intravenous Every 48 hours 04/19/17 1109 04/20/17 1100   04/20/17 1200  vancomycin (VANCOCIN) IVPB 750 mg/150 ml premix  Status:  Discontinued     750 mg 150 mL/hr over 60 Minutes Intravenous Every 24 hours 04/20/17 1100 04/22/17 0800   04/19/17 1000  meropenem (MERREM) 500 mg in sodium chloride 0.9 % 50 mL IVPB  Status:  Discontinued     500 mg 100 mL/hr over 30 Minutes Intravenous Every 12 hours 04/19/17 0612 04/21/17 1535   04/19/17 0800  vancomycin (VANCOCIN) 500 mg in sodium chloride 0.9 % 100 mL IVPB  Status:  Discontinued     500 mg 100 mL/hr over 60 Minutes Intravenous Every 48 hours 04/19/17 0612 04/19/17 1109   04/18/17 2215  vancomycin (VANCOCIN) IVPB 1000 mg/200 mL premix     1,000 mg 200 mL/hr over 60 Minutes Intravenous  Once 04/18/17 2209 04/18/17 2317   04/18/17 2215  meropenem (MERREM) 1 g in sodium chloride 0.9 % 100 mL IVPB     1 g 200 mL/hr over 30 Minutes Intravenous  Once 04/18/17 2209 04/18/17 2330      Lab Results:   Recent Labs  04/28/17 2313 04/29/17 0801  WBC 11.1* 11.8*  HGB 9.2* 9.0*  HCT 28.6* 28.0*  PLT 217 198   BMET  Recent Labs  04/28/17 0349 04/29/17 0801  NA 138 136  K 4.1 3.7  CL 115* 110  CO2 17* 19*  GLUCOSE 110* 101*  BUN 11 13  CREATININE 1.01 1.38*  CALCIUM 5.4* 6.9*   PT/INR No results for input(s): LABPROT, INR in the last 72 hours. CMP     Component Value Date/Time   NA 136 04/29/2017 0801   K 3.7 04/29/2017 0801   CL 110 04/29/2017 0801   CO2 19 (L) 04/29/2017 0801   GLUCOSE 101 (H) 04/29/2017 0801   BUN 13 04/29/2017 0801   CREATININE 1.38 (H) 04/29/2017 0801   CREATININE 2.66 (H) 01/27/2015 0832   CALCIUM 6.9 (L) 04/29/2017 0801   PROT 4.7 (L) 04/29/2017 0801   ALBUMIN 1.6 (L) 04/29/2017 0801   AST 50 (H) 04/29/2017 0801   ALT 32 04/29/2017 0801   ALKPHOS 252 (H) 04/29/2017 0801   BILITOT 0.7 04/29/2017 0801   GFRNONAA 48 (L) 04/29/2017 0801   GFRAA 56 (L) 04/29/2017 0801   Lipase  No results found  for: LIPASE  Studies/Results: Nm Gi Blood Loss  Result Date: 04/28/2017 CLINICAL DATA:  GI bleed EXAM: NUCLEAR MEDICINE GASTROINTESTINAL BLEEDING SCAN TECHNIQUE: Sequential abdominal images were obtained following intravenous administration of Tc-59m labeled red blood cells. RADIOPHARMACEUTICALS:  27.2 mCi Tc-54m in-vitro labeled red cells. COMPARISON:  None. FINDINGS: Planar imaging of the abdomen and pelvis after injection of tagged red blood cells reveals no evidence for active extravasation over 2 hours of  observation. IMPRESSION: No findings to suggest active GI bleeding during the 2 hour period of observation. Electronically Signed   By: Misty Stanley M.D.   On: 04/28/2017 20:12      Kalman Drape , [facility] 04/29/2017, 10:09 AM Pager: [phone] Consults: [phone] Mon-Fri 7:00 am-4:30 pm Sat-Sun 7:00 am-11:30 am

## 2017-04-29 NOTE — Progress Notes (Signed)
Physical Therapy Treatment Patient Details Name: Colin [name] MRN: [id number] DOB: [birth year] Today's Date: 04/29/2017    History of Present Illness Pt admitted 04/18/17 with GIB, recto-prostatic fistula, periurethral abscess. Hospital course complicated by delirium, elevated troponin (demand), hyponatremia, +MRSA. s/p drainage of perineal abscess, placement of suprapubic tube and sigmoid colectomy 10/18. PMH: CKD 3, remote alchoholism, Hep C, HTN, prostate cancer, DM.    PT Comments    Patient progressing with transfers and ambulation this session.  Remains with redness and edema R LE and pain, but much improved ability to use leg for transfers and gait this session.  Continues to be confused, though pleasant and cooperative.  Remains appropriate for SNF level rehab at d/c.   Follow Up Recommendations  SNF     Equipment Recommendations  Other (comment) (TBA)    Recommendations for Other Services       Precautions / Restrictions Precautions Precautions: Fall Precaution Comments: colostomy, foley (fistula)    Mobility  Bed Mobility Overal bed mobility: Needs Assistance Bed Mobility: Rolling;Sidelying to Sit Rolling: Min assist Sidelying to sit: Mod assist       General bed mobility comments: cues for log roll method to minimize abdominal pain, assist for LEs over EOB, to raise trunk and position hips at EOB using bed pad, use of rail, increased time  Transfers Overall transfer level: Needs assistance Equipment used: Rolling walker (2 wheeled) Transfers: Sit to/from Stand Sit to Stand: +2 physical assistance;Min assist         General transfer comment: from elevated bed, assist to rise and steady, cues for hand placement  Ambulation/Gait Ambulation/Gait assistance: Min assist;+2 safety/equipment Ambulation Distance (Feet): 8 Feet Assistive device: Rolling walker (2 wheeled) Gait Pattern/deviations: Step-to pattern;Step-through pattern;Decreased stride  length;Shuffle;Wide base of support     General Gait Details: cues for stride length, distance, assist for safety with walker, chair following   Stairs            Wheelchair Mobility    Modified Rankin (Stroke Patients Only)       Balance Overall balance assessment: Needs assistance Sitting-balance support: Feet supported Sitting balance-Leahy Scale: Fair       Standing balance-Leahy Scale: Poor Standing balance comment: reliant on B UE support                            Cognition Arousal/Alertness: Awake/alert Behavior During Therapy: WFL for tasks assessed/performed Overall Cognitive Status: Impaired/Different from baseline Area of Impairment: Memory;Following commands;Problem solving                 Orientation Level: Disoriented to;Situation   Memory: Decreased short-term memory Following Commands: Follows one step commands with increased time (w/repitition) Safety/Judgement: Decreased awareness of deficits   Problem Solving: Slow processing;Decreased initiation;Difficulty sequencing;Requires verbal cues;Requires tactile cues General Comments: pt very HOH      Exercises      General Comments        Pertinent Vitals/Pain Pain Assessment: 0-10 Pain Score: 5  Pain Location: abdomen, groin, R LE Pain Descriptors / Indicators: Grimacing;Guarding;Moaning Pain Intervention(s): Monitored during session;Limited activity within patient's tolerance;Repositioned    Home Living                      Prior Function            PT Goals (current goals can now be found in the care plan section) Acute Rehab PT Goals Patient Stated  Goal: family wants rehab prior to return home Progress towards PT goals: Progressing toward goals    Frequency    Min 3X/week      PT Plan Current plan remains appropriate    Co-evaluation PT/OT/SLP Co-Evaluation/Treatment: Yes Reason for Co-Treatment: For patient/therapist safety PT goals  addressed during session: Mobility/safety with mobility;Proper use of DME OT goals addressed during session: ADL's and self-care      AM-PAC PT "6 Clicks" Daily Activity  Outcome Measure  Difficulty turning over in bed (including adjusting bedclothes, sheets and blankets)?: A Lot Difficulty moving from lying on back to sitting on the side of the bed? : Unable Difficulty sitting down on and standing up from a chair with arms (e.g., wheelchair, bedside commode, etc,.)?: Unable Help needed moving to and from a bed to chair (including a wheelchair)?: A Lot Help needed walking in hospital room?: A Lot Help needed climbing 3-5 steps with a railing? : Total 6 Click Score: 9    End of Session Equipment Utilized During Treatment: Gait belt Activity Tolerance: Patient limited by fatigue Patient left: in chair;with call bell/phone within reach;with chair alarm set   PT Visit Diagnosis: Other abnormalities of gait and mobility (R26.89);Muscle weakness (generalized) (M62.81);Other symptoms and signs involving the nervous system (R29.898)     Time: 2376-2831 PT Time Calculation (min) (ACUTE ONLY): 18 min  Charges:  $Gait Training: 8-22 mins                    G CodesMagda Kiel, [place] 04/29/2017    Reginia Naas 04/29/2017, 9:43 AM

## 2017-04-29 NOTE — Consult Note (Addendum)
[facility] Consult: 10:11 AM 04/29/2017  LOS: 11 days    Referring Provider: Dr Verlon Au  Primary Care Physician:  Redmond School, MD Primary Gastroenterologist:  Dr. Gala Romney     Reason for Consultation:  Anemia, melenic stool.     HPI: Colin [name] is a 76 y.o. male.  Hx Hepatitis C, succesfully treated with Harvoni, completed 11/2015.  Metavir F0/F1, limited fibrosis, no cirrhosis on 11/2014 elastography.   Anemia of chronic disease.  Prostate cancer, radiation 2007, recurrence 12/2015 with cryoablation 05/2016.  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  CKD stage 3.  DM2.  S/p TKR bil with complications.   No ETOH since 2013 and no IVDA since 1980s.    Borderline intrahepatic ductal dilatation was incidental finding on CT to look at prostate.  02/2014 Colonoscopy.  FOBT +.    Removed 2 polyps.  75m carpeted at cecum, 3 clips placed due to post polypectomy oozing and gaping defect 5 mm at descending.  Scattered left colon tics.   02/2014 EGD for chronic GERD, FOBT +.  9-10 mm pedunculated antral polyp removed.  O/w normal to D2.     Admitted 10/12.  1 month black stool.  A day or so of mental status changes.  AKI.  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  Liver , biliary tree, pancras appeared normal on non-contrast study.   Chronic subdural hematoma per head CT  04/24/17 lap sigmoid end colostomy and drainage of abscess, placement of suprabubic foley catheter.   Developed maroon colored stools and dropping Hgb 10/22.  Bleeding scan negative 10/22. Stools today are again soft and brown with novisible evidence of blood. .   05/2016 Hgb of 12.1.  8.8 on 10/12.  Since then measures: 7.8.. 8.3.. 7.6.. 8.5.. 8.2.. 7.9.. 7.6.. 6.4 today.   PRBC on 10/18 and 10/22, total 2  U.   10/12: Alk Phos 223.  Ammonia 92.  Normal t bili and transaminases.   Ammonia 20 on 10/15.   Repeat LFTs on 10/17 and 10/23 with continued elevation Alk Phos: 252.  coags normal. Troponin elevated, felt due to demand ischemia.  Pt very poor historian.  Can not say how long stools have been dark.  No recall of GI bleeding.  Rare BC powder use, 2 x month at most.  No NSAIDs.  No GI upset or anorexia.  No weight loss.  No previous blood transfusions.  Dysphagia. No unusual bleeding or bruising tendency. Since the surgery he's been having lower abdominal pain. Lower extremity edema he says started since admission but the record states that he has chronic right lower extremity edema and a question of osteomyelitis in the lower leg in 2011.  Patient isn't aware of having taken oral iron.          Past Medical History:  Diagnosis Date  . Arthritis   . Bilateral lower extremity edema   . Chronic venous insufficiency   . CKD (chronic kidney disease), stage III [place]   . History of alcohol abuse  none since 2013 per pt  . History of chronic hepatitis    Genotype 1, FO/F1-- treated w/ harvoni (completed May 2016)  . History of external beam radiation therapy    2007 prostate  . History of intravenous drug use in remission    per pt in remission since 1980's  . HOH (hard of hearing)   . Hypertension   . Normocytic anemia   . Prostate cancer ([place])   . Recurrent prostate cancer [facility]) urologist-  dr Junious Silk   dx 2007  s/p  external beam radioation therapy/ recurrent 12-2015  Gleason 4+4  . Type 2 diabetes, diet controlled ([place])    no meds since 2010 approx    Past Surgical History:  Procedure Laterality Date  . CATARACT EXTRACTION W/PHACO Left 12/14/2012   Procedure: CATARACT EXTRACTION PHACO AND INTRAOCULAR LENS PLACEMENT (IOC);  Surgeon: Tonny Branch, MD;  Location: [facility];  Service: Ophthalmology;  Laterality: Left;  CDE: 13.74  . CATARACT EXTRACTION W/PHACO Right 12/28/2012    Procedure: CATARACT EXTRACTION PHACO AND INTRAOCULAR LENS PLACEMENT (IOC);  Surgeon: Tonny Branch, MD;  Location: [facility];  Service: Ophthalmology;  Laterality: Right;  CDE:12.80  . COLONOSCOPY N/A 02/14/2014   HKV:[id number] colonic polyps-removed as described above Colonic diverticulosis (tubular adenoma)  . CRYOABLATION N/A 05/24/2016   Procedure: CRYO ABLATION PROSTATE;  Surgeon: Festus Aloe, MD;  Location: [facility];  Service: Urology;  Laterality: N/A;  . ENDOVENOUS ABLATION SAPHENOUS VEIN W/ LASER  2013   Right leg X 2 by Dr. Debara Pickett  . ESOPHAGOGASTRODUODENOSCOPY N/A 02/14/2014   VFI:EPPIRJ large pedunculated gastric polyp (hyperplastic)  . INCISION AND DRAINAGE OF PERITONSILLAR ABCESS  04/24/2017   Procedure: INCISION AND DRAINAGE OF PERINEUM ABCESS;  Surgeon: Franchot Gallo, MD;  Location: [place];  Service: Urology;;  . INSERTION OF SUPRAPUBIC CATHETER N/A 04/24/2017   Procedure: OPEN INSERTION OF SUPRAPUBIC CATHETER;  Surgeon: Franchot Gallo, MD;  Location: [place];  Service: Urology;  Laterality: N/A;  . LAPAROSCOPIC DIVERTED COLOSTOMY N/A 04/24/2017   Procedure: LAPAROSCOPIC SIGMOID COLOSTOMY;  Surgeon: Ileana Roup, MD;  Location: [place];  Service: General;  Laterality: N/A;  . REIMPLANTATION OF TOTAL KNEE Left 03/16/2010  . TONSILLECTOMY  child  . TOTAL KNEE  PROSTHESIS REMOVAL W/ SPACER INSERTION Left 12/29/2009  . TOTAL KNEE ARTHROPLASTY Bilateral left 2007/   right 2005 approx    Prior to Admission medications   Medication Sig Start Date End Date Taking? Authorizing Provider  cephALEXin (KEFLEX) 500 MG capsule Take 1 capsule (500 mg total) by mouth 2 (two) times daily. 05/24/16  Yes Festus Aloe, MD  ferrous sulfate 325 (65 FE) MG tablet Take 325 mg by mouth 3 (three) times daily with meals.   Yes [provider]  HYDROcodone-acetaminophen (LORCET HD) 10-325 MG per tablet Take 1 tablet by mouth every 6 (six) hours as needed for pain.    Yes [provider]  Multiple Vitamins-Minerals (MULTIVITAMIN WITH MINERALS) tablet Take 1 tablet by mouth daily.     Yes [provider]  torsemide (DEMADEX) 20 MG tablet Take 2 tablets by mouth 4 (four) times daily.  12/28/13  Yes [provider]    Scheduled Meds: . Chlorhexidine Gluconate Cloth  6 each Topical Once  . feeding supplement (ENSURE ENLIVE)  237 mL Oral BID BM  . folic acid  1 mg Oral Daily  . multivitamin with minerals  1 tablet Oral Daily  . sodium bicarbonate  1,300 mg Oral BID  .  thiamine  100 mg Oral Daily   Infusions: . sodium chloride    . piperacillin-tazobactam (ZOSYN)  IV 3.375 g (04/29/17 0600)  . vancomycin Stopped (04/28/17 2242)   PRN Meds: HYDROmorphone (DILAUDID) injection, oxyCODONE, sodium chloride flush   Allergies as of 04/18/2017  . (No Known Allergies)    Family History  Problem Relation Age of Onset  . Colon cancer Neg Hx   . Liver disease Neg Hx     Social History   Social History  . Marital status: Divorced    Spouse name: N/A  . Number of children: 3  . Years of education: N/A   Occupational History  . Not on file.   Social History Main Topics  . Smoking status: Former Smoker    Years: 12.00    Quit date: 05/04/1972  . Smokeless tobacco: Never Used  . Alcohol use No     Comment: hx alcohol abuse-- per pt  none since 2013.  . Drug use: No     Comment: Previous IV drug user ovr 30 yrs ago--  per pt quit 1980's  . Sexual activity: Not on file   Other Topics Concern  . Not on file   Social History Narrative  . No narrative on file    REVIEW OF SYSTEMS: Constitutional:  Weakness better ENT:  No nose bleeds Pulm:  No SOB or cough CV:  No palpitations, no chest pain GU:  Urine is normal in appearance, hasn't seen blood in the urine GI:  Per HPI.   Heme:  Per HPI   Transfusions:  Per HPI Neuro:  No headaches, no peripheral tingling or numbness Derm:  No itching, no rash or sores.    Endocrine:  No sweats or chills.  No polyuria or dysuria Immunization:  not questioned Travel:  None beyond local counties in last few months.    PHYSICAL EXAM: Vital signs in last 24 hours: Vitals:   04/28/17 2030 04/29/17 0437  BP: 113/68 120/70  Pulse: 74 78  Resp: 19   Temp: 98.5 F (36.9 C) 98.4 F (36.9 C)  SpO2: 95% 96%   Wt Readings from Last 3 Encounters:  04/29/17 84.4 kg (186 lb 1.6 oz)  05/27/16 97.5 kg (215 lb)  05/24/16 97.7 kg (215 lb 5 oz)    General: chronically ill-looking.Alert. Comfortable. Head:  Facial asymmetry or swelling.  Eyes:  No scleral icterus, no conjunctival pallor. Ears:  Hard of hearing  Nose:  No congestion or discharge Mouth:  Tongue midline. Mucosa pink, moist, clear. Has 2 remaining teeth,  Canines in his lower jaw. Neck:  no JVD, no masses, no thyromegaly Lungs:  Clear bilaterally. No labored breathing or cough.  There is linear bruising on the left thorax, question due to a fall prior to admission? Heart: RRR. No MRG. S1, S2 present Abdomen:  Soft. Tender in the lower abdomen. Stapled, intact long transverse lower midline scar without any bruising. Stool in ostomy bag in the right lower quadrant is soft and brown. Suprapubic cath on right lower abdomen.   Rectal: did not perform rectal exam   Musc/Skeltl: no joint erythema or gross deformity. Extremities:  Swelling and violaceous discoloration in a macular pattern on the right lower leg  Neurologic:  Patient is alert. Oriented times 3. However she has poor recall of his medical issues and recent events.  Moves all 4 limbs, strength not tested. No asterixis or tremor. Skin:  Telangiectasia scattered across upper chest Tattoos:  Several, visible on  the arms Nodes:  No cervical or inguinal adenopathy.   Psych:  Cooperative, calm, pleasant.  Intake/Output from previous day: 10/22 0701 - 10/23 0700 In: 2356.7 [P.O.:720; I.V.:510; Blood:620; IV Piggyback:506.7] Out: 2425 [Urine:2375;  Stool:50]  LAB RESULTS:  Recent Labs  04/28/17 1829 04/28/17 2313 04/29/17 0801  WBC 11.8* 11.1* 11.8*  HGB 9.3* 9.2* 9.0*  HCT 28.6* 28.6* 28.0*  PLT 200 217 198   BMET Lab Results  Component Value Date   NA 136 04/29/2017   NA 138 04/28/2017   NA 137 04/27/2017   K 3.7 04/29/2017   K 4.1 04/28/2017   K 3.4 (L) 04/27/2017   CL 110 04/29/2017   CL 115 (H) 04/28/2017   CL 113 (H) 04/27/2017   CO2 19 (L) 04/29/2017   CO2 17 (L) 04/28/2017   CO2 19 (L) 04/27/2017   GLUCOSE 101 (H) 04/29/2017   GLUCOSE 110 (H) 04/28/2017   GLUCOSE 103 (H) 04/27/2017   BUN 13 04/29/2017   BUN 11 04/28/2017   BUN 16 04/27/2017   CREATININE 1.38 (H) 04/29/2017   CREATININE 1.01 04/28/2017   CREATININE 1.19 04/27/2017   CALCIUM 6.9 (L) 04/29/2017   CALCIUM 5.4 (LL) 04/28/2017   CALCIUM 7.0 (L) 04/27/2017   LFT  Recent Labs  04/28/17 0349 04/29/17 0801  PROT  --  4.7*  ALBUMIN 1.2* 1.6*  AST  --  50*  ALT  --  32  ALKPHOS  --  252*  BILITOT  --  0.7   PT/INR Lab Results  Component Value Date   INR 1.23 04/23/2017   INR 1.26 04/18/2017   INR 1.11 08/19/2014    RADIOLOGY STUDIES: Nm Gi Blood Loss  Result Date: 04/28/2017 CLINICAL DATA:  GI bleed EXAM: NUCLEAR MEDICINE GASTROINTESTINAL BLEEDING SCAN TECHNIQUE: Sequential abdominal images were obtained following intravenous administration of Tc-61mlabeled red blood cells. RADIOPHARMACEUTICALS:  27.2 mCi Tc-949mn-vitro labeled red cells. COMPARISON:  None. FINDINGS: Planar imaging of the abdomen and pelvis after injection of tagged red blood cells reveals no evidence for active extravasation over 2 hours of observation. IMPRESSION: No findings to suggest active GI bleeding during the 2 hour period of observation. Electronically Signed   By: ErMisty Stanley.D.   On: 04/28/2017 20:12     IMPRESSION:   *  Bleeding per rectum, Hematochezia vs rapid upper GI bleed.  Had black diarrhea at home PTA.   Diverticular?  Diverticulosis per 2015 Colonoscopy PUD?  Vs variceal vs portal gastropathy.  *  Acute on chronic anemia. Not sure how much of the recent acute drop is attributable to GI bleed alone.  S/p PRBC x 2.    *  Hep C.  Eradicated with Harvoni 11/2015.  Non-detectable viral load in 01/2015 and 01/2016.  Fibrosis but not cirrhosis on elastography in 11/2014.  Certainly could have developed cirrhosis in the interim. Noncontrast CT scan performed 10 days ago did not reveal any liver abnormality, nor did contrasted CT in 10/2015. Ammonia level elevated to a max of 92 on the day of his admission which makes possibility of cirrhosis more likely.  Coags are not elevated. Alkaline phosphatase chronically elevated dating back to 2012.    *  Vesicular/enteric fistula and perineal abscess. Status post end sigmoid colostomy and drainage of abscess, placement of suprapubic Foley catheter     PLAN:     *  EGD ?? Tomorrow?? Will d/w Dr GeCarlean Purl     SaAzucena Freed10/23/2018, 10:11 AM Pager:  [phone]   I do not think this is a rapid UGI bleed. Brown stool now.  Probably distal colonic source maybe even related to ostomy.  Will observe - if sig more bleeding will need sigmoposcopy colonoscopy via colostomy  Gatha Mayer, MD, [facility] [phone] (pager) 04/29/2017 5:54 PM

## 2017-04-29 NOTE — Consult Note (Addendum)
[place] Nurse ostomy follow up Stoma type/location: LLQ, end colostomy Stomal assessment/size: 1 3/8", pink, budded Peristomal assessment: NA Treatment options for stomal/peristomal skin: using 2" barrier ring currently Output normal, brown, loose stool today Ostomy pouching: 1pc.flat with 2" barrier ring   Education provided:  Discussed ostomy care with patient again today, needs to start learning with assist from staff for emptying at least.  Educational booklet in the room, much more engaged today. [place] nurse will contact son to discuss options for education with him Enrolled patient in [place] Start Discharge program: Yes  Extras supplies in the patient's room for use    WOC Nurse wound consult note Reason for Consult: PA at bedside  Wound type: surgical, perineum  Pressure Injury POA:NA Measurement: 2.0cm x 1.0cm x 3.5cm  Wound bed: unable to visualize  Drainage (amount, consistency, odor) minimal, serosanguinous  Periwound: candida overgrowth, will add antifungal cream Dressing procedure/placement/frequency: Repacked with saline moist conform gauze, covered with ABD pad.   Please note with [facility] PA and WOC nurse is a FULL 2person assist, would not be safe to be at home with no assistance.     [place] Nurse will follow along with you for continued support with ostomy teaching and care [place] MSN, RN, [place], [place], [place]

## 2017-04-29 NOTE — Progress Notes (Signed)
Occupational Therapy Treatment Patient Details Name: Colin [name] MRN: [id number] DOB: [birth year] Today's Date: 04/29/2017    History of present illness Pt admitted 04/18/17 with GIB, recto-prostatic fistula, periurethral abscess. Hospital course complicated by delirium, elevated troponin (demand), hyponatremia, +MRSA. s/p drainage of perineal abscess, placement of suprapubic tube and sigmoid colectomy 10/18. PMH: CKD 3, remote alchoholism, Hep C, HTN, prostate cancer, DM.   OT comments  Pt educated in log roll technique for bed mobility, stood from elevated bed and ambulated within room with 2 person min assist. Pt remained in chair and performed grooming and self feeding. Continues to demonstrate impaired cognition, decreased balance and generalized weakness. Reports moderate pain in abdomen and R LE which is edematous. SNF continues to be the optimum d/c disposition. Will continue to follow.  Follow Up Recommendations  SNF;Supervision/Assistance - 24 hour    Equipment Recommendations       Recommendations for Other Services      Precautions / Restrictions Precautions Precautions: Fall Precaution Comments: colostomy, foley (fistula)       Mobility Bed Mobility Overal bed mobility: Needs Assistance Bed Mobility: Rolling;Sidelying to Sit Rolling: Min assist Sidelying to sit: Min assist       General bed mobility comments: cues for log roll method to minimize abdominal pain, assist for LEs over EOB, to raise trunk and position hips at EOB using bed pad, use of rail, increased time  Transfers Overall transfer level: Needs assistance Equipment used: Rolling walker (2 wheeled) Transfers: Sit to/from Stand Sit to Stand: +2 physical assistance;Min assist         General transfer comment: from elevated bed, assist to rise and steady, cues for hand placement    Balance Overall balance assessment: Needs assistance Sitting-balance support: Feet supported Sitting  balance-Leahy Scale: Fair       Standing balance-Leahy Scale: Poor Standing balance comment: reliant on B UE support                           ADL either performed or assessed with clinical judgement   ADL Overall ADL's : Needs assistance/impaired Eating/Feeding: Set up;Sitting   Grooming: Wash/dry hands;Wash/dry face;Sitting;Set up           Upper Body Dressing : Moderate assistance;Sitting Upper Body Dressing Details (indicate cue type and reason): front opening gown                 Functional mobility during ADLs: Minimal assistance;Rolling walker;+2 for safety/equipment       Vision       Perception     Praxis      Cognition Arousal/Alertness: Awake/alert Behavior During Therapy: WFL for tasks assessed/performed Overall Cognitive Status: Impaired/Different from baseline Area of Impairment: Memory;Following commands;Problem solving                     Memory: Decreased short-term memory Following Commands: Follows one step commands with increased time (repetition and tactile cues)     Problem Solving: Slow processing;Decreased initiation;Difficulty sequencing;Requires verbal cues;Requires tactile cues General Comments: pt very HOH        Exercises     Shoulder Instructions       General Comments      Pertinent Vitals/ Pain       Pain Assessment: 0-10 Pain Score: 5  Pain Location: abdomen, groin, R LE Pain Descriptors / Indicators: Grimacing;Guarding;Moaning Pain Intervention(s): Repositioned;Monitored during session  Home Living  Prior Functioning/Environment              Frequency  Min 2X/week        Progress Toward Goals  OT Goals(current goals can now be found in the care plan section)  Progress towards OT goals: Progressing toward goals  Acute Rehab OT Goals Patient Stated Goal: family wants rehab prior to return home OT Goal Formulation:  With patient/family Time For Goal Achievement: 05/07/17 Potential to Achieve Goals: Good  Plan Discharge plan remains appropriate    Co-evaluation    PT/OT/SLP Co-Evaluation/Treatment: Yes Reason for Co-Treatment: For patient/therapist safety   OT goals addressed during session: ADL's and self-care      AM-PAC PT "6 Clicks" Daily Activity     Outcome Measure   Help from another person eating meals?: None Help from another person taking care of personal grooming?: A Little Help from another person toileting, which includes using toliet, bedpan, or urinal?: A Lot Help from another person bathing (including washing, rinsing, drying)?: A Lot Help from another person to put on and taking off regular upper body clothing?: A Lot Help from another person to put on and taking off regular lower body clothing?: Total 6 Click Score: 14    End of Session Equipment Utilized During Treatment: Gait belt;Rolling walker  OT Visit Diagnosis: Unsteadiness on feet (R26.81);Muscle weakness (generalized) (M62.81);Pain;Other symptoms and signs involving cognitive function   Activity Tolerance Patient limited by pain   Patient Left in chair;with call bell/phone within reach;with chair alarm set   Nurse Communication Other (comment) (son would like a phone call about his dad's condition)        Time: 8786-7672 OT Time Calculation (min): 17 min  Charges: OT General Charges $OT Visit: 1 Visit OT Treatments $Self Care/Home Management : 8-22 mins  04/29/2017 Colin [name], OTR/L Pager: [phone]   Colin [name] 04/29/2017, 9:19 AM

## 2017-04-29 NOTE — Progress Notes (Addendum)
Colin [name]  LSL:[id number] DOB: 1941-04-14 DOA: 04/18/2017 PCP: Redmond School, MD    Brief Narrative:  89 stage 3 CKD,  ETOHism, Hepatitis C, HTN,  Anemia,  Prostate Cancer,  Type 2 DM  who presented to [facility] on 10/12 with "black" stool for one month which had progressively worsened over 3 days.   Hemoglobin 8.8, WBC 41.2. C.Diff negative, CT A/P with apparent fistula between the prostate gland / prostatic urethra and the anterior wall of the rectum.   Foley was placed with black stool output noted.   Patient was transferred to [facility] for further management.   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   On 10/22, noted maroon-colored soft stools in colostomy and ABLA, hemoglobin dropped from 7.6 > 6.4, checking bleeding scan.   Significant Events: 10/12 admit through [facility]  Subjective: Patient is doing fair Stool does not appear to be as dark as previously per RN and is semi-formed He has some pain on ambulation but no pain at rest Pain moderately controlled with OxyIR 5-10 every 6 as needed  Assessment & Plan:  Rectourethral fistula General surgery and urology consulted and patient underwent combined suprapubic tube, diverting colostomy for rectourethral fistula with periurethral abscess drainage on 10/18. GI and surgery following. Due to suspected GI bleed, will downgrade diet to clear liquids temporarily. Ostomy nurse using a 2 inch barrier ring To learn how to empty bag Pain being controlled moderately  Periurethral abscess Status post surgical drainage in OR 10/18. As per ID follow-up, plan to treat for 14 days with Zosyn using 10/18 as day 1. Left upper arm midline placed 10/17. As per urology follow-up 10/21, diligent wound care to the perineal abscess is recommended and he discussed with RN. Urology will follow. Wound nurse was consulted--2 X1 X3.5 wound with  minimal serosanguineous drainage and candidate overgrowth-moist conformer gauze covered with ABD pad  GI Bleed - Acute blood loss anemia  Follow CBCs closely and transfuse if hemoglobin is 7 or less. S/P 1 PRBC at surgery on 10/18. Post Hb 8.5 >8.2 >7.9 >7.6. Hemoglobin 7.6 yesterday to 6.4-->9 had 2 U prbc Suspect lower GI bleed? Related to recent colostomy--bleeding scan negative, GI consulted?  Upper GI study a.m.  MRSA bacteremia  ID directing care of this issue . As per ID follow-up, plan to treat minimum of 14 days using 10/18 as day 1--Stop date 11.1.18 Midline in place  Hypokalemia  Replace and follow as needed  Non-anion gap metabolic acidosis Due to renal failure as well as possible GI bicarb losses.  Started oral sodium bicarbonate tablets 10/17, likely not enough time to act.  Stable at present time without acute worsening  Acute encephalopathy/? Portosystemic - As per chart review, not sure how much alcohol he uses. Concern for thiamine and folate deficiency, ordered supplements. Improved.  Hypernatremia  Reflective of free water deficit - corrected w/ free water resuscitation    Elevated Troponin in setting of demand ischemia  Troponin never signif elevated   HTN BP currently well controlled  Not currently on medications for this  AKI on CKD Stage 3 baseline Crt 1.4-1.8. Creatinine has now normalized. Holding home medication of Demadex 20 mg 4 times a day  Liver Cirrhosis - Chronic Hep C   ?Cellulitis to Right LE  No evidence on exam today to suggest an active infection - B LE venous duplex w/o DVT  on 04/19/17. Stable without change.  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 Corrected serum calcium is 7.6.  DVT prophylaxis: SCDs Code Status: FULL CODE Family Communication:  Disposition Plan: Patient not medically stable for discharge. DC to a SNF when stable.   Consultants:  PCCM Gen Surgery Urology  Nephrology  ID  Antimicrobials:  Vancomycin 10/12 > Meropenem 10/12 >10/17 Zosyn 10/17>   Objective: Vitals:   04/28/17 1441 04/28/17 1645 04/28/17 2030 04/29/17 0437  BP: 111/65 114/72 113/68 120/70  Pulse: 78 68 74 78  Resp: (!) 22 20 19    Temp: 98.8 F (37.1 C) 98.6 F (37 C) 98.5 F (36.9 C) 98.4 F (36.9 C)  TempSrc: Oral Oral  Oral  SpO2: 100% 94% 95% 96%  Weight:    84.4 kg (186 lb 1.6 oz)  Height:          Intake/Output Summary (Last 24 hours) at 04/29/17 1111 Last data filed at 04/29/17 0600  Gross per 24 hour  Intake          1746.67 ml  Output             2425 ml  Net          -678.33 ml   Filed Weights   04/27/17 0525 04/28/17 0448 04/29/17 0437  Weight: 82.7 kg (182 lb 6.4 oz) 84.5 kg (186 lb 4.8 oz) 84.4 kg (186 lb 1.6 oz)    Examination:  Alert pleasant edentulous S1-S2 no murmur rub or gallop No tenderness abdomen ostomy seems intact patient has a suprapubic catheter as well No lower extremity edema Chest is clear Neurologically intact although slightly slow with speech moves all 4 limbs equally   CBC:  Recent Labs Lab 04/27/17 0434 04/28/17 0349 04/28/17 1829 04/28/17 2313 04/29/17 0801  WBC 10.7* 8.4 11.8* 11.1* 11.8*  HGB 7.6* 6.4* 9.3* 9.2* 9.0*  HCT 24.0* 19.9* 28.6* 28.6* 28.0*  MCV 85.4 86.1 84.6 84.6 84.6  PLT 200 147* 200 217 308   Basic Metabolic Panel:  Recent Labs Lab 04/23/17 0356 04/24/17 0614 04/25/17 0433 04/26/17 0425 04/27/17 0434 04/28/17 0349 04/29/17 0801  NA 137 135 137 136 137 138 136  K 4.2 4.1 3.9 3.5 3.4* 4.1 3.7  CL 116* 114* 112* 110 113* 115*  110  CO2 13* 13* 15* 17* 19* 17* 19*  GLUCOSE 90 102* 147* 92 103* 110* 101*  BUN 56* 37* 29* 21* 16 11 13   CREATININE 1.25* 1.19 1.27* 1.25* 1.19 1.01 1.38*  CALCIUM 7.7* 7.3* 7.3* 7.0* 7.0* 5.4* 6.9*  MG 1.6* 1.9  --   --   --   --  1.3*  PHOS <1.0* 2.0* 3.7  --   --  2.0* 3.2   GFR: Estimated Creatinine Clearance: 47.1 mL/min (A) (by C-G formula based on SCr of 1.38 mg/dL (H)).  Liver Function Tests:  Recent Labs Lab 04/23/17 0356 04/25/17 0433 04/28/17 0349 04/29/17 0801  AST 40  --   --  50*  ALT 20  --   --  32  ALKPHOS 210*  --   --  252*  BILITOT 0.7  --   --  0.7  PROT 5.3*  --   --  4.7*  ALBUMIN 1.8* 1.9* 1.2* 1.6*   No results for input(s): AMMONIA in the last 168 hours.  Coagulation Profile:  Recent Labs Lab 04/23/17 0356  INR 1.23    Cardiac Enzymes: No results for input(s): CKTOTAL, CKMB, CKMBINDEX, TROPONINI in the last 168 hours.   CBG:  Recent Labs Lab 04/24/17 1204  GLUCAP 154*    Recent Results (from the past 240 hour(s))  Culture, blood (Routine X 2) w Reflex to ID Panel     Status: None   Collection Time: 04/20/17 11:35 AM  Result Value Ref Range Status   Specimen Description BLOOD BLOOD RIGHT HAND  Final   Special Requests   Final    BOTTLES DRAWN AEROBIC AND ANAEROBIC Blood Culture adequate volume   Culture NO GROWTH 5 DAYS  Final   Report Status 04/25/2017 FINAL  Final  Culture, blood (Routine X 2) w Reflex to ID Panel     Status: None   Collection Time: 04/20/17 11:41 AM  Result Value Ref Range Status   Specimen Description BLOOD BLOOD LEFT HAND  Final   Special Requests IN PEDIATRIC BOTTLE Blood Culture adequate volume  Final   Culture NO GROWTH 5 DAYS  Final   Report Status 04/25/2017 FINAL  Final     Scheduled Meds: . Chlorhexidine Gluconate Cloth  6 each Topical Once  . feeding supplement (ENSURE ENLIVE)  237 mL Oral BID BM  . folic acid  1 mg Oral Daily  . multivitamin with minerals  1 tablet Oral Daily  . sodium  bicarbonate  1,300 mg Oral BID  . thiamine  100 mg Oral Daily     LOS: 11 days   Nita Sells, MD, FACP, FHM. [facility] Pager [phone]  If 7PM-7AM, please contact night-coverage [email or web address] Password [email or web address] 04/29/2017, 11:11 AM

## 2017-04-29 NOTE — Progress Notes (Signed)
Pharmacy Antibiotic Note  Colin [name] is a 76 y.o. male admitted on 04/18/2017 with urethral/rectal fistula and MRSA bacteremia.  Continues on vancomycin and Zosyn.  Patient's renal function is fluctuating, currently rising.  He is afebrile and his WBC is hovering in the 11s.   Plan: Continue vanc 750mg  IV Q24H Continue Zosyn EI 3.375 gm IV Q8H Monitor renal fxn, clinical progress, repeat vanc trough PRN F/u Mag supplementation  BMET in AM   Height: 5\' 6"  (167.6 cm) Weight: 186 lb 1.6 oz (84.4 kg) IBW/kg (Calculated) : 63.8  Temp (24hrs), Avg:98.5 F (36.9 C), Min:98.4 F (36.9 C), Max:98.6 F (37 C)   Recent Labs Lab 04/23/17 1247  04/25/17 0433 04/26/17 0425 04/26/17 1518 04/27/17 0434 04/28/17 0349 04/28/17 1829 04/28/17 2313 04/29/17 0801  WBC  --   --  18.2* 12.7*  --  10.7* 8.4 11.8* 11.1* 11.8*  CREATININE  --   < > 1.27* 1.25*  --  1.19 1.01  --   --  1.38*  VANCOTROUGH 22*  --   --   --  21*  --   --   --   --   --   < > = values in this interval not displayed.  Estimated Creatinine Clearance: 47.1 mL/min (A) (by C-G formula based on SCr of 1.38 mg/dL (H)).    No Known Allergies   Vanc 10/12 >> (10/31) Merrem 10/12 >> 10/17 Zosyn 10/17 >> (10/31)  10/17 VT = 22 on 1g q24 >> 750 mg q24 10/20 VT = 21 on 750 mg q24 >> no change  10/13 MRSA - NEG 10/12 Blood - MRSA 1/2 10/12 CDiff - NEG 10/14 BCx2 - negative   Cavon Nicolls D. Mina Marble, PharmD, BCPS Pager:  [phone] 04/29/2017, 3:17 PM

## 2017-04-30 DIAGNOSIS — K921 Melena: Secondary | ICD-10-CM

## 2017-04-30 DIAGNOSIS — K591 Functional diarrhea: Secondary | ICD-10-CM

## 2017-04-30 LAB — CBC WITH DIFFERENTIAL/PLATELET
Basophils Absolute: 0 10*3/uL (ref 0.0–0.1)
Basophils Relative: 0 %
Eosinophils Absolute: 0.3 10*3/uL (ref 0.0–0.7)
Eosinophils Relative: 2 %
HEMATOCRIT: 30.9 % — AB (ref 39.0–52.0)
HEMOGLOBIN: 10.2 g/dL — AB (ref 13.0–17.0)
LYMPHS ABS: 1.7 10*3/uL (ref 0.7–4.0)
LYMPHS PCT: 15 %
MCH: 28 pg (ref 26.0–34.0)
MCHC: 33 g/dL (ref 30.0–36.0)
MCV: 84.9 fL (ref 78.0–100.0)
MONOS PCT: 13 %
Monocytes Absolute: 1.5 10*3/uL — ABNORMAL HIGH (ref 0.1–1.0)
NEUTROS ABS: 8 10*3/uL — AB (ref 1.7–7.7)
NEUTROS PCT: 70 %
Platelets: 209 10*3/uL (ref 150–400)
RBC: 3.64 MIL/uL — AB (ref 4.22–5.81)
RDW: 19.9 % — ABNORMAL HIGH (ref 11.5–15.5)
WBC: 11.5 10*3/uL — AB (ref 4.0–10.5)

## 2017-04-30 LAB — COMPREHENSIVE METABOLIC PANEL
ALK PHOS: 239 U/L — AB (ref 38–126)
ALT: 46 U/L (ref 17–63)
AST: 62 U/L — ABNORMAL HIGH (ref 15–41)
Albumin: 1.6 g/dL — ABNORMAL LOW (ref 3.5–5.0)
Anion gap: 7 (ref 5–15)
BILIRUBIN TOTAL: 1 mg/dL (ref 0.3–1.2)
BUN: 11 mg/dL (ref 6–20)
CALCIUM: 7.1 mg/dL — AB (ref 8.9–10.3)
CO2: 22 mmol/L (ref 22–32)
Chloride: 104 mmol/L (ref 101–111)
Creatinine, Ser: 1.31 mg/dL — ABNORMAL HIGH (ref 0.61–1.24)
GFR calc non Af Amer: 52 mL/min — ABNORMAL LOW (ref >60)
GFR, EST AFRICAN AMERICAN: 60 mL/min — AB (ref >60)
Glucose, Bld: 99 mg/dL (ref 65–99)
Potassium: 4.4 mmol/L (ref 3.5–5.1)
SODIUM: 133 mmol/L — AB (ref 135–145)
TOTAL PROTEIN: 4.9 g/dL — AB (ref 6.5–8.1)

## 2017-04-30 NOTE — Progress Notes (Signed)
          Daily Rounding Note  04/30/2017, 3:11 PM  LOS: 12 days   SUBJECTIVE:   Chief complaint: Hungry.  On clears.  Stools per ostomy still soft, brown. Post op lower abdominal pain.     OBJECTIVE:         Vital signs in last 24 hours:    Temp:  [98.8 F (37.1 C)-99.7 F (37.6 C)] 99.1 F (37.3 C) (10/24 0434) Pulse Rate:  [50-107] 107 (10/24 0950) Resp:  [18-20] 20 (10/24 0950) BP: (104-137)/(45-69) 137/68 (10/24 0950) SpO2:  [95 %-100 %] 100 % (10/24 0950) Weight:  [84.4 kg (186 lb)] 84.4 kg (186 lb) (10/24 0434) Last BM Date: 04/30/17 Filed Weights   04/28/17 0448 04/29/17 0437 04/30/17 0434  Weight: 84.5 kg (186 lb 4.8 oz) 84.4 kg (186 lb 1.6 oz) 84.4 kg (186 lb)   General: looks chronically unwell, comfortable.  NAD   Heart: RRR Chest: clear bil Abdomen: soft, NT.  ND .  Active BS.  Soft brown stool in ostomy  Extremities: edema and ? Stasis dermatitis in right LE Neuro/Psych:  Oriented x 3.  Appropriate.     Lab Results:  Recent Labs  04/28/17 2313 04/29/17 0801 04/30/17 0317  WBC 11.1* 11.8* 11.5*  HGB 9.2* 9.0* 10.2*  HCT 28.6* 28.0* 30.9*  PLT 217 198 209    ASSESMENT:   *  Bleeding per rectum, Hematochezia vs rapid upper GI bleed. Maroon stools in ostomy reported ~ 10/22, resolved by 10/23.  Had black diarrhea at home PTA.   Diverticular? Diverticulosis per 2015 Colonoscopy Suspect distal colonic source possibly related to his recent ostomy.  *  Acute on chronic anemia. Not sure how much of the recent acute drop is attributable to GI bleed alone.  S/p PRBC x 2.    *  Hep C.  Eradicated with Harvoni 11/2015.  Non-detectable viral load in 01/2015 and 01/2016.  Fibrosis but not cirrhosis on elastography in 11/2014.  Noncontrast CT scan performed 10 days ago did not reveal any liver abnormality, nor did contrasted CT in 10/2015. Ammonia level elevated to a max of 92 on day of admit.  Coags are not  elevated. Alkaline phosphatase chronically elevated dating back to 2012.    *  Vesicular/enteric fistula and perineal abscess. Status post end sigmoid colostomy and drainage of abscess, placement of suprapubic Foley catheter   PLAN   *  As bleeding seems to have stopped, no plans for colonoscopy or EGD.  Advance to regular diet.    Azucena Freed  04/30/2017, 3:11 PM Pager: [phone]    [facility] Attending   I have taken an interval history, reviewed the chart and examined the patient. I agree with the Advanced Practitioner's note, impression and recommendations.   Call us back if ?  Suggest consider elective colonoscopy which he will need if ostomy takedown is an option anyway.  Gatha Mayer, MD, [facility] [phone] (pager) 04/30/2017 4:31 PM

## 2017-04-30 NOTE — Progress Notes (Signed)
Colin [name]  YBO:[id number] DOB: 09-12-1940 DOA: 04/18/2017 PCP: Redmond School, MD    Brief Narrative:  65 stage 3 CKD,  ETOHism, Hepatitis C, HTN,  Anemia,  Prostate Cancer,  Type 2 DM  who presented to [facility] on 10/12 with "black" stool for one month which had progressively worsened over 3 days.   Hemoglobin 8.8, WBC 41.2. C.Diff negative, CT A/P with apparent fistula between the prostate gland / prostatic urethra and the anterior wall of the rectum.   Foley was placed with black stool output noted.   Patient was transferred to [facility] for further management.   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   On 10/22, noted maroon-colored soft stools in colostomy and ABLA, hemoglobin dropped from 7.6 > 6.4, checking bleeding scan-was neg  GI consulted-unclearif will need UGI scope vs not--on clear liquids still   Significant Events: 10/12 admit through [facility]  Subjective: Patient is doing fair Stool does not appear to be as dark as previously per RN and is semi-formed He has some pain on ambulation but no pain at rest Pain moderately controlled with OxyIR 5-10 every 6 as needed  Assessment & Plan:  Rectourethral fistula General surgery and urology consulted and patient underwent combined suprapubic tube, diverting colostomy for rectourethral fistula with periurethral abscess drainage on 10/18. GI and surgery following.  Due to suspected GI bleed, on clear liquids temporarily--defer to GI if and when to start diet and type Ostomy nurse using a 2 inch barrier ring To learn how to empty bag--will need to learn management of this Pain being controlled moderately  Periurethral abscess Status post surgical drainage in OR 10/18. As per ID follow-up, plan to treat for 14 days with Zosyn using 10/18 as day 1. Left upper arm midline placed 10/17. As per urology follow-up 10/21,  diligent wound care to the perineal abscess is recommended and he discussed with RN. Urology will follow. Wound nurse was consulted--2 X1 X3.5 wound with minimal serosanguineous drainage and candidate overgrowth-moist conformer gauze covered with ABD pad  GI Bleed - Acute blood loss anemia  Follow CBCs closely and transfuse if hemoglobin is 7 or less. S/P 1 PRBC at surgery on 10/18. Post Hb 8.5 >8.2 >7.9 >7.6. Hemoglobin 7.6 yesterday to 6.4-->9 had 2 U prbc Suspect lower GI bleed? Related to recent colostomy--bleeding scan negative, GI consulted Await further input from GI Will re-check  Labs again 10/25  MRSA bacteremia  ID directing care of this issue . As per ID follow-up, plan to treat minimum of 14 days using 10/18 as day 1--Stop date 11.1.18 Midline in place and will need to be pulled on stopping abx WBC stable in 11 range  Hypokalemia  Replace and follow as needed Currently 4.4  Non-anion gap metabolic acidosis Due to renal failure as well as possible GI bicarb losses.  Started oral sodium bicarbonate tablets 10/17, bicarb to 22 from 17.  Stopping tabs today Labs in am  Acute encephalopathy/? Portosystemic - As per chart review, not sure how much alcohol he uses. Concern for thiamine and folate deficiency, ordered supplements. Improved.  Hypernatremia  Reflective of free water deficit - corrected w/ free water resuscitation    Elevated Troponin in setting of demand ischemia  Troponin never signif elevated   HTN BP currently well controlled  Not currently on medications for this  AKI on CKD Stage 3 baseline Crt  1.4-1.8. Creatinine has now normalized. Holding home medication of Demadex 20 mg 4 times a day  Liver Cirrhosis - Chronic Hep C  S/p Rx Harvoni  ?Cellulitis to Right LE  No evidence on exam today to suggest an active infection - B LE venous duplex w/o DVT on 04/19/17. Stable without change.  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 Corrected serum calcium is 7.6.  DVT prophylaxis: SCDs Code Status: FULL CODE Family Communication:  Disposition Plan: . DC to a SNF when stable--expecting 10/25 am   Consultants:  PCCM Gen Surgery Urology  Nephrology  ID  Antimicrobials:  Vancomycin 10/12 > Meropenem 10/12 >10/17 Zosyn 10/17>   Objective: Vitals:   04/29/17 1523 04/29/17 2134 04/30/17 0434 04/30/17 0950  BP: 107/60 109/69 (!) 104/45 137/68  Pulse: 76 70 (!) 50 (!) 107  Resp: 19 18 18 20   Temp: 99.7 F (37.6 C) 98.8 F (37.1 C) 99.1 F (37.3 C)   TempSrc: Oral Oral Oral   SpO2: 95% 100% 98% 100%  Weight:   84.4 kg (186 lb)   Height:          Intake/Output Summary (Last 24 hours) at 04/30/17 0952 Last data filed at 04/30/17 0951  Gross per 24 hour  Intake              800 ml  Output             2200 ml  Net            -1400 ml   Filed Weights   04/28/17 0448 04/29/17 0437 04/30/17 0434  Weight: 84.5 kg (186 lb 4.8 oz) 84.4 kg (186 lb 1.6 oz) 84.4 kg (186 lb)    Examination:  Alert pleasant edentulous S1-S2 no murmur rub or gallop No tenderness abdomen ostomy seems intact patient has a suprapubic catheter as well  Wound in rectum cursorily examined and no oozing--candidal build-up noted  Suprapubic foley in place No lower extremity edema Chest is clear Neurologically intact although slightly slow with speech moves all 4 limbs equally   CBC:  Recent Labs Lab 04/28/17 0349 04/28/17 1829 04/28/17 2313 04/29/17 0801 04/30/17 0317  WBC 8.4 11.8* 11.1* 11.8* 11.5*  NEUTROABS  --   --   --   --  8.0*  HGB  6.4* 9.3* 9.2* 9.0* 10.2*  HCT 19.9* 28.6* 28.6* 28.0* 30.9*  MCV 86.1 84.6 84.6 84.6 84.9  PLT 147* 200 217 198 735   Basic Metabolic Panel:  Recent Labs Lab 04/24/17 0614 04/25/17 0433 04/26/17 0425 04/27/17 0434 04/28/17 0349 04/29/17 0801 04/30/17 0317  NA 135 137 136 137 138 136 133*  K 4.1 3.9 3.5 3.4* 4.1 3.7 4.4  CL 114* 112* 110 113* 115* 110 104  CO2 13* 15* 17* 19* 17* 19* 22  GLUCOSE 102* 147* 92 103* 110* 101* 99  BUN 37* 29* 21* 16 11 13 11   CREATININE 1.19 1.27* 1.25* 1.19 1.01 1.38* 1.31*  CALCIUM 7.3* 7.3* 7.0* 7.0* 5.4* 6.9* 7.1*  MG 1.9  --   --   --   --  1.3*  --   PHOS 2.0* 3.7  --   --  2.0* 3.2  --    GFR: Estimated Creatinine Clearance: 49.6 mL/min (A) (by C-G formula based on SCr of 1.31 mg/dL (H)).  Liver Function Tests:  Recent Labs Lab 04/25/17 0433 04/28/17 0349 04/29/17 0801 04/30/17 0317  AST  --   --  50* 62*  ALT  --   --  32 46  ALKPHOS  --   --  252* 239*  BILITOT  --   --  0.7 1.0  PROT  --   --  4.7* 4.9*  ALBUMIN 1.9* 1.2* 1.6* 1.6*   No results for input(s): AMMONIA in the last 168 hours.  Coagulation Profile: No results for input(s): INR, PROTIME in the last 168 hours.  Cardiac Enzymes: No results for input(s): CKTOTAL, CKMB, CKMBINDEX, TROPONINI in the last 168 hours.   CBG:  Recent Labs Lab 04/24/17 1204  GLUCAP 154*    Recent Results (from the past 240 hour(s))  Culture, blood (Routine X 2) w Reflex to ID Panel     Status: None   Collection Time: 04/20/17 11:35 AM  Result Value Ref Range Status   Specimen Description BLOOD BLOOD RIGHT HAND  Final   Special Requests   Final    BOTTLES DRAWN AEROBIC AND ANAEROBIC Blood Culture adequate volume   Culture NO GROWTH 5 DAYS  Final   Report Status 04/25/2017 FINAL  Final  Culture, blood (Routine X 2) w Reflex to ID Panel     Status: None   Collection Time: 04/20/17 11:41 AM  Result Value Ref Range Status   Specimen Description BLOOD BLOOD LEFT HAND  Final    Special Requests IN PEDIATRIC BOTTLE Blood Culture adequate volume  Final   Culture NO GROWTH 5 DAYS  Final   Report Status 04/25/2017 FINAL  Final     Scheduled Meds: . Chlorhexidine Gluconate Cloth  6 each Topical Once  . feeding supplement (ENSURE ENLIVE)  237 mL Oral BID BM  . folic acid  1 mg Oral Daily  . multivitamin with minerals  1 tablet Oral Daily  . sodium bicarbonate  1,300 mg Oral BID  . thiamine  100 mg Oral Daily     LOS: 12 days   Verneita Griffes, MD [facility] [phone]

## 2017-04-30 NOTE — Clinical Social Work Note (Signed)
Clinical Social Work Assessment  Patient Details  Name: Colin [name] MRN: [id number] Date of Birth: Aug 31, 1940  Date of referral:  04/30/17               Reason for consult:  Facility Placement                Permission sought to share information with:  Facility Sport and exercise psychologist, Family Supports Permission granted to share information::  Yes, Verbal Permission Granted  Name::     Psychiatric nurse::  SNF  Relationship::  Son  Sport and exercise psychologist Information:     Housing/Transportation Living arrangements for the past 2 months:  [place] of Information:  Patient, Adult Children Patient Interpreter Needed:  None Criminal Activity/Legal Involvement Pertinent to Current Situation/Hospitalization:  No - Comment as needed Significant Relationships:  Adult Children Lives with:  Self Do you feel safe going back to the place where you live?  Yes Need for family participation in patient care:  Yes (Comment) (patient has memory impairment)  Care giving concerns:  Patient has been living at home alone but will benefit from short term rehab prior to returning home in order to improve ability to independently care for self.   Social Worker assessment / plan:  CSW met with patient and patient's son at bedside to discuss recommendation for SNF and present bed offers. CSW provided list of bed offers and discussed preference for either [place] or [place], as patient lives in [place] but the patient's son lives and works in [place]. CSW will follow up with the patient's son after he has time to review bed offers and determine facility preferences.  Employment status:  Retired Nurse, adult PT Recommendations:  Frank / Referral to community resources:     Patient/Family's Response to care:  Patient and patient's son agreeable to SNF placement.  Patient/Family's Understanding of and Emotional Response to Diagnosis,  Current Treatment, and Prognosis:  Patient and patient's son were appreciative of CSW information and discussion of rehab options. Patient was slightly confused about what was being discussed, but indicated that he and his son could talk about it and figure out a plan. Patient's son indicated that he might prefer a facility in [place], because he lives and works in [place]. Patient's son works at [employer] and will contact coworkers to determine where would be a good facility for the patient to go for rehab.   Emotional Assessment Appearance:  Appears stated age Attitude/Demeanor/Rapport:    Affect (typically observed):  Appropriate Orientation:  Oriented to Self, Oriented to Place, Oriented to  Time, Oriented to Situation Alcohol / Substance use:  Not Applicable Psych involvement (Current and /or in the community):  No (Comment)  Discharge Needs  Concerns to be addressed:  Care Coordination Readmission within the last 30 days:  No Current discharge risk:  Lives alone, Physical Impairment Barriers to Discharge:  Continued Medical Work up   [employer], [place] 04/30/2017, 11:28 AM

## 2017-04-30 NOTE — Progress Notes (Signed)
[facility]/Trauma Progress Note  6 Days Post-Op   Assessment/Plan Active Problems: Altered mental status Acute renal failure (HCC) Metabolic acidosis Melena Diarrhea in adult patient Heme positive stool Perineal abscess Recto-prostatic fistula MRSA bacteremia Staphylococcus aureus bacteremia with sepsis (HCC) Severe sepsis with septic shock (HCC) Leukocytosis  Rectourethral fistula  Periurethral fistula GI bleed - S/P Laparoscopic creation of an end sigmoid colostomy, Dr. Dema Severin, 10/18 - S/P Drainage of perineal abscess, placement of open suprapubic tube, 6 French Foley, Dr. Diona Fanti, 10/18 - good ostomy output - wet to dry dressing changes BID to perineal wound  Acute blood loss anemia, possible GI bleed - Pt dropped Hg and got 2U PRBC 10/22, blood in stool 10/22 - no blood noted in stool last 2 days - Last colonoscopy was 2015 for blood in stool and 2 polyps were removed; one from near cecum and one in mid descending colon. - GI consulted  MRSA baceriemia - ID involved and appreciate their assistance - vancomycin  FEN: soft diet VTE: SCD's, no chem DVT due to anemia, HG stable at 10.2 ID: Vanc & Zosyn 10/17>>   WBC trending down  Foley: yes per urology Follow up: Dr. Dema Severin 2 weeks after discharge, 1st sp tube change in our office in [place] in 6 weeks per Dr. Diona Fanti  DISPO:  Nec med scan neg for acute GI bleed, GI consulted per medicine for GI bleed. No blood in stool today and Hg stable. If pt tolerates diet he is clear for discharge from a surgical standpoint.     LOS: 12 days    Subjective:  CC: abdominal pain  Pt states he is doing well. No new complaints. Tolerating diet. No vomiting. Son at bedside.  Objective: Vital signs in last 24 hours: Temp:  [98.8 F (37.1 C)-99.7 F (37.6 C)] 99.1 F (37.3 C) (10/24 0434) Pulse Rate:  [50-107] 107 (10/24 0950) Resp:  [18-20] 20 (10/24 0950) BP:  (104-137)/(45-69) 137/68 (10/24 0950) SpO2:  [95 %-100 %] 100 % (10/24 0950) Weight:  [186 lb (84.4 kg)] 186 lb (84.4 kg) (10/24 0434) Last BM Date: 04/30/17  Intake/Output from previous day: 10/23 0701 - 10/24 0700 In: 800 [P.O.:700] Out: 1700 [Urine:1550; Stool:150] Intake/Output this shift: Total I/O In: -  Out: 500 [Urine:500]  PE: Gen: Alert, NAD, pleasant, cooperative Pulm: Rate andeffort normal Abd: Soft, not distened, +BS, ostomy pink, brown soft stool in bag, no blood noted, no TTP, erythematous macular rash noted in suprapubic region looks like yeast  Skin: warm and dry   Anti-infectives: Anti-infectives    Start     Dose/Rate Route Frequency Ordered Stop   04/23/17 2200  piperacillin-tazobactam (ZOSYN) IVPB 3.375 g     3.375 g 12.5 mL/hr over 240 Minutes Intravenous Every 8 hours 04/23/17 2033     04/23/17 1600  vancomycin (VANCOCIN) IVPB 750 mg/150 ml premix     750 mg 150 mL/hr over 60 Minutes Intravenous Every 24 hours 04/23/17 1404     04/23/17 1330  vancomycin (VANCOCIN) IVPB 1000 mg/200 mL premix  Status:  Discontinued     1,000 mg 200 mL/hr over 60 Minutes Intravenous Every 24 hours 04/23/17 0801 04/23/17 1404   04/23/17 1015  cefoTEtan (CEFOTAN) 2 g in dextrose 5 % 50 mL IVPB  Status:  Discontinued     2 g 100 mL/hr over 30 Minutes Intravenous On call to O.R. 04/23/17 1012 04/23/17 1234   04/22/17 1200  vancomycin (VANCOCIN) IVPB 1000 mg/200 mL premix  Status:  Discontinued     1,000 mg 200 mL/hr over 60 Minutes Intravenous Every 24 hours 04/22/17 0800 04/23/17 0801   04/21/17 2200  meropenem (MERREM) 1 g in sodium chloride 0.9 % 100 mL IVPB  Status:  Discontinued     1 g 200 mL/hr over 30 Minutes Intravenous Every 12 hours 04/21/17 1535 04/23/17 2029   04/21/17 0800  vancomycin (VANCOCIN) IVPB 1000 mg/200 mL premix  Status:  Discontinued     1,000 mg 200 mL/hr over 60 Minutes Intravenous Every 48 hours 04/19/17 1109 04/20/17 1100   04/20/17 1200   vancomycin (VANCOCIN) IVPB 750 mg/150 ml premix  Status:  Discontinued     750 mg 150 mL/hr over 60 Minutes Intravenous Every 24 hours 04/20/17 1100 04/22/17 0800   04/19/17 1000  meropenem (MERREM) 500 mg in sodium chloride 0.9 % 50 mL IVPB  Status:  Discontinued     500 mg 100 mL/hr over 30 Minutes Intravenous Every 12 hours 04/19/17 0612 04/21/17 1535   04/19/17 0800  vancomycin (VANCOCIN) 500 mg in sodium chloride 0.9 % 100 mL IVPB  Status:  Discontinued     500 mg 100 mL/hr over 60 Minutes Intravenous Every 48 hours 04/19/17 0612 04/19/17 1109   04/18/17 2215  vancomycin (VANCOCIN) IVPB 1000 mg/200 mL premix     1,000 mg 200 mL/hr over 60 Minutes Intravenous  Once 04/18/17 2209 04/18/17 2317   04/18/17 2215  meropenem (MERREM) 1 g in sodium chloride 0.9 % 100 mL IVPB     1 g 200 mL/hr over 30 Minutes Intravenous  Once 04/18/17 2209 04/18/17 2330      Lab Results:   Recent Labs  04/29/17 0801 04/30/17 0317  WBC 11.8* 11.5*  HGB 9.0* 10.2*  HCT 28.0* 30.9*  PLT 198 209   BMET  Recent Labs  04/29/17 0801 04/30/17 0317  NA 136 133*  K 3.7 4.4  CL 110 104  CO2 19* 22  GLUCOSE 101* 99  BUN 13 11  CREATININE 1.38* 1.31*  CALCIUM 6.9* 7.1*   PT/INR No results for input(s): LABPROT, INR in the last 72 hours. CMP     Component Value Date/Time   NA 133 (L) 04/30/2017 0317   K 4.4 04/30/2017 0317   CL 104 04/30/2017 0317   CO2 22 04/30/2017 0317   GLUCOSE 99 04/30/2017 0317   BUN 11 04/30/2017 0317   CREATININE 1.31 (H) 04/30/2017 0317   CREATININE 2.66 (H) 01/27/2015 0832   CALCIUM 7.1 (L) 04/30/2017 0317   PROT 4.9 (L) 04/30/2017 0317   ALBUMIN 1.6 (L) 04/30/2017 0317   AST 62 (H) 04/30/2017 0317   ALT 46 04/30/2017 0317   ALKPHOS 239 (H) 04/30/2017 0317   BILITOT 1.0 04/30/2017 0317   GFRNONAA 52 (L) 04/30/2017 0317   GFRAA 60 (L) 04/30/2017 0317   Lipase  No results found for: LIPASE  Studies/Results: Nm Gi Blood Loss  Result Date:  04/28/2017 CLINICAL DATA:  GI bleed EXAM: NUCLEAR MEDICINE GASTROINTESTINAL BLEEDING SCAN TECHNIQUE: Sequential abdominal images were obtained following intravenous administration of Tc-42m labeled red blood cells. RADIOPHARMACEUTICALS:  27.2 mCi Tc-25m in-vitro labeled red cells. COMPARISON:  None. FINDINGS: Planar imaging of the abdomen and pelvis after injection of tagged red blood cells reveals no evidence for active extravasation over 2 hours of observation. IMPRESSION: No findings to suggest active GI bleeding during the 2 hour period of observation. Electronically Signed   By: Misty Stanley M.D.   On: 04/28/2017 20:12  Kalman Drape , [facility] 04/30/2017, 10:03 AM Pager: [phone] Consults: [phone] Mon-Fri 7:00 am-4:30 pm Sat-Sun 7:00 am-11:30 am

## 2017-04-30 NOTE — Progress Notes (Signed)
Physical Therapy Treatment Patient Details Name: Colin [name] MRN: [id number] DOB: [birth year] Today's Date: 04/30/2017    History of Present Illness Pt admitted 04/18/17 with GIB, recto-prostatic fistula, periurethral abscess. Hospital course complicated by delirium, elevated troponin (demand), hyponatremia, +MRSA. s/p drainage of perineal abscess, placement of suprapubic tube and sigmoid colectomy 10/18. PMH: CKD 3, remote alchoholism, Hep C, HTN, prostate cancer, DM.    PT Comments    Patient progressing slowly due to pain, limited activity tolerance/weakness.  Continue to recommend SNF level rehab at d/c.  PT to follow acutely.    Follow Up Recommendations  SNF     Equipment Recommendations  Other (comment) (TBA)    Recommendations for Other Services       Precautions / Restrictions Precautions Precautions: Fall Precaution Comments: colostomy, foley (fistula)    Mobility  Bed Mobility Overal bed mobility: Needs Assistance Bed Mobility: Supine to Sit     Supine to sit: Mod assist     General bed mobility comments: pt brought legs off bed and assist to lift trunk upright  Transfers Overall transfer level: Needs assistance Equipment used: Rolling walker (2 wheeled) Transfers: Sit to/from Stand Sit to Stand: +2 physical assistance;Min assist         General transfer comment: assist to rise, cues for foot and hand placement  Ambulation/Gait Ambulation/Gait assistance: Min assist;+2 safety/equipment Ambulation Distance (Feet): 8 Feet Assistive device: Rolling walker (2 wheeled) Gait Pattern/deviations: Step-to pattern;Shuffle;Decreased stride length;Wide base of support     General Gait Details: slow pace, cues for encouragement to continue to goal   Stairs            Wheelchair Mobility    Modified Rankin (Stroke Patients Only)       Balance                                            Cognition Arousal/Alertness:  Awake/alert Behavior During Therapy: WFL for tasks assessed/performed Overall Cognitive Status: Impaired/Different from baseline Area of Impairment: Memory;Following commands;Problem solving                 Orientation Level: Disoriented to;Situation   Memory: Decreased short-term memory Following Commands: Follows one step commands with increased time Safety/Judgement: Decreased awareness of deficits   Problem Solving: Slow processing;Decreased initiation        Exercises      General Comments        Pertinent Vitals/Pain Pain Assessment: Faces Faces Pain Scale: Hurts whole lot Pain Location: bottom when in chair Pain Descriptors / Indicators: Restless;Grimacing Pain Intervention(s): Monitored during session;Repositioned;Other (comment) (RN aware may need back to bed after lunch)    Home Living                      Prior Function            PT Goals (current goals can now be found in the care plan section) Progress towards PT goals: Progressing toward goals    Frequency    Min 3X/week      PT Plan Current plan remains appropriate    Co-evaluation              AM-PAC PT "6 Clicks" Daily Activity  Outcome Measure  Difficulty turning over in bed (including adjusting bedclothes, sheets and blankets)?: A Lot Difficulty moving from lying on back to  sitting on the side of the bed? : Unable Difficulty sitting down on and standing up from a chair with arms (e.g., wheelchair, bedside commode, etc,.)?: Unable Help needed moving to and from a bed to chair (including a wheelchair)?: A Lot Help needed walking in hospital room?: A Lot Help needed climbing 3-5 steps with a railing? : Total 6 Click Score: 9    End of Session Equipment Utilized During Treatment: Gait belt Activity Tolerance: Patient limited by fatigue;Patient limited by pain Patient left: in chair;with call bell/phone within reach;with chair alarm set Nurse Communication: Mobility  status (need for back to bed after lunch) PT Visit Diagnosis: Other abnormalities of gait and mobility (R26.89);Muscle weakness (generalized) (M62.81);Other symptoms and signs involving the nervous system (F57.322)     Time: 0254-2706 PT Time Calculation (min) (ACUTE ONLY): 18 min  Charges:  $Gait Training: 8-22 mins                    G CodesMagda Kiel, [place] 04/30/2017    Reginia Naas 04/30/2017, 2:23 PM

## 2017-05-01 DIAGNOSIS — R262 Difficulty in walking, not elsewhere classified: Secondary | ICD-10-CM | POA: Diagnosis not present

## 2017-05-01 DIAGNOSIS — R296 Repeated falls: Secondary | ICD-10-CM | POA: Diagnosis not present

## 2017-05-01 DIAGNOSIS — N183 Chronic kidney disease, stage 3 (moderate): Secondary | ICD-10-CM | POA: Diagnosis not present

## 2017-05-01 DIAGNOSIS — Z433 Encounter for attention to colostomy: Secondary | ICD-10-CM | POA: Diagnosis not present

## 2017-05-01 DIAGNOSIS — Z792 Long term (current) use of antibiotics: Secondary | ICD-10-CM | POA: Diagnosis not present

## 2017-05-01 DIAGNOSIS — I129 Hypertensive chronic kidney disease with stage 1 through stage 4 chronic kidney disease, or unspecified chronic kidney disease: Secondary | ICD-10-CM | POA: Diagnosis not present

## 2017-05-01 DIAGNOSIS — Z789 Other specified health status: Secondary | ICD-10-CM | POA: Diagnosis not present

## 2017-05-01 DIAGNOSIS — L02215 Cutaneous abscess of perineum: Secondary | ICD-10-CM | POA: Diagnosis not present

## 2017-05-01 DIAGNOSIS — S065X9A Traumatic subdural hemorrhage with loss of consciousness of unspecified duration, initial encounter: Secondary | ICD-10-CM | POA: Diagnosis not present

## 2017-05-01 DIAGNOSIS — R7881 Bacteremia: Secondary | ICD-10-CM | POA: Diagnosis not present

## 2017-05-01 DIAGNOSIS — B9562 Methicillin resistant Staphylococcus aureus infection as the cause of diseases classified elsewhere: Secondary | ICD-10-CM | POA: Diagnosis not present

## 2017-05-01 DIAGNOSIS — R4182 Altered mental status, unspecified: Secondary | ICD-10-CM | POA: Diagnosis not present

## 2017-05-01 DIAGNOSIS — M6281 Muscle weakness (generalized): Secondary | ICD-10-CM | POA: Diagnosis not present

## 2017-05-01 DIAGNOSIS — R5381 Other malaise: Secondary | ICD-10-CM | POA: Diagnosis not present

## 2017-05-01 DIAGNOSIS — A09 Infectious gastroenteritis and colitis, unspecified: Secondary | ICD-10-CM | POA: Diagnosis not present

## 2017-05-01 DIAGNOSIS — N4289 Other specified disorders of prostate: Secondary | ICD-10-CM | POA: Diagnosis not present

## 2017-05-01 DIAGNOSIS — D649 Anemia, unspecified: Secondary | ICD-10-CM | POA: Diagnosis not present

## 2017-05-01 DIAGNOSIS — E1122 Type 2 diabetes mellitus with diabetic chronic kidney disease: Secondary | ICD-10-CM | POA: Diagnosis not present

## 2017-05-01 DIAGNOSIS — N179 Acute kidney failure, unspecified: Secondary | ICD-10-CM | POA: Diagnosis not present

## 2017-05-01 DIAGNOSIS — R197 Diarrhea, unspecified: Secondary | ICD-10-CM | POA: Diagnosis not present

## 2017-05-01 DIAGNOSIS — R488 Other symbolic dysfunctions: Secondary | ICD-10-CM | POA: Diagnosis not present

## 2017-05-01 DIAGNOSIS — R6520 Severe sepsis without septic shock: Secondary | ICD-10-CM | POA: Diagnosis not present

## 2017-05-01 DIAGNOSIS — D62 Acute posthemorrhagic anemia: Secondary | ICD-10-CM | POA: Diagnosis not present

## 2017-05-01 DIAGNOSIS — Z87898 Personal history of other specified conditions: Secondary | ICD-10-CM | POA: Diagnosis not present

## 2017-05-01 DIAGNOSIS — Z48815 Encounter for surgical aftercare following surgery on the digestive system: Secondary | ICD-10-CM | POA: Diagnosis not present

## 2017-05-01 DIAGNOSIS — N360 Urethral fistula: Secondary | ICD-10-CM | POA: Diagnosis not present

## 2017-05-01 LAB — CBC WITH DIFFERENTIAL/PLATELET
BASOS ABS: 0.1 10*3/uL (ref 0.0–0.1)
BASOS PCT: 1 %
Eosinophils Absolute: 0.2 10*3/uL (ref 0.0–0.7)
Eosinophils Relative: 2 %
HEMATOCRIT: 29.4 % — AB (ref 39.0–52.0)
HEMOGLOBIN: 9.6 g/dL — AB (ref 13.0–17.0)
LYMPHS PCT: 19 %
Lymphs Abs: 1.8 10*3/uL (ref 0.7–4.0)
MCH: 28 pg (ref 26.0–34.0)
MCHC: 32.7 g/dL (ref 30.0–36.0)
MCV: 85.7 fL (ref 78.0–100.0)
MONO ABS: 1.1 10*3/uL — AB (ref 0.1–1.0)
MONOS PCT: 11 %
NEUTROS ABS: 6.5 10*3/uL (ref 1.7–7.7)
NEUTROS PCT: 67 %
Platelets: 235 10*3/uL (ref 150–400)
RBC: 3.43 MIL/uL — ABNORMAL LOW (ref 4.22–5.81)
RDW: 19.6 % — AB (ref 11.5–15.5)
WBC: 9.6 10*3/uL (ref 4.0–10.5)

## 2017-05-01 LAB — BASIC METABOLIC PANEL
ANION GAP: 11 (ref 5–15)
BUN: 13 mg/dL (ref 6–20)
CALCIUM: 7.2 mg/dL — AB (ref 8.9–10.3)
CHLORIDE: 103 mmol/L (ref 101–111)
CO2: 20 mmol/L — AB (ref 22–32)
Creatinine, Ser: 1.38 mg/dL — ABNORMAL HIGH (ref 0.61–1.24)
GFR calc non Af Amer: 48 mL/min — ABNORMAL LOW (ref >60)
GFR, EST AFRICAN AMERICAN: 56 mL/min — AB (ref >60)
GLUCOSE: 119 mg/dL — AB (ref 65–99)
Potassium: 4.2 mmol/L (ref 3.5–5.1)
Sodium: 134 mmol/L — ABNORMAL LOW (ref 135–145)

## 2017-05-01 MED ORDER — PIPERACILLIN-TAZOBACTAM 3.375 G IVPB
3.3750 g | Freq: Three times a day (TID) | INTRAVENOUS | Status: AC
Start: 2017-05-01 — End: 2017-05-10

## 2017-05-01 MED ORDER — OXYCODONE HCL 5 MG PO TABS: 5.0000 mg | ORAL_TABLET | Freq: Four times a day (QID) | ORAL | 0 refills | Status: DC | PRN

## 2017-05-01 MED ORDER — VANCOMYCIN HCL IN DEXTROSE 750-5 MG/150ML-% IV SOLN: 750.0000 mg | INTRAVENOUS | Status: AC

## 2017-05-01 MED ORDER — FOLIC ACID 1 MG PO TABS: 1.0000 mg | ORAL_TABLET | Freq: Every day | ORAL | 0 refills | Status: DC

## 2017-05-01 MED ORDER — PNEUMOCOCCAL VAC POLYVALENT 25 MCG/0.5ML IJ INJ
0.5000 mL | INJECTION | INTRAMUSCULAR | Status: AC | PRN
  Administered 2017-05-01: 0.5 mL via INTRAMUSCULAR

## 2017-05-01 MED ORDER — INFLUENZA VAC SPLIT HIGH-DOSE 0.5 ML IM SUSY
0.5000 mL | PREFILLED_SYRINGE | INTRAMUSCULAR | Status: DC | PRN
  Filled 2017-05-01: qty 0.5

## 2017-05-01 NOTE — Discharge Instructions (Signed)
Please arrive at least 30 min before your appointment to complete your check in paperwork.  If you are unable to arrive 30 min prior to your appointment time we may have to cancel or reschedule you. ° °LAPAROSCOPIC SURGERY: POST OP INSTRUCTIONS  °1. DIET: Follow a light bland diet the first 24 hours after arrival home, such as soup, liquids, crackers, etc. Be sure to include lots of fluids daily. Avoid fast food or heavy meals as your are more likely to get nauseated. Eat a low fat the next few days after surgery.  °2. Take your usually prescribed home medications unless otherwise directed. °3. PAIN CONTROL:  °1. Pain is best controlled by a usual combination of three different methods TOGETHER:  °1. Ice/Heat °2. Over the counter pain medication °3. Prescription pain medication °2. Most patients will experience some swelling and bruising around the incisions. Ice packs or heating pads (30-60 minutes up to 6 times a day) will help. Use ice for the first few days to help decrease swelling and bruising, then switch to heat to help relax tight/sore spots and speed recovery. Some people prefer to use ice alone, heat alone, alternating between ice & heat. Experiment to what works for you. Swelling and bruising can take several weeks to resolve.  °3. It is helpful to take an over-the-counter pain medication regularly for the first few weeks. Choose one of the following that works best for you:  °1. Naproxen (Aleve, etc) Two 220mg tabs twice a day °2. Ibuprofen (Advil, etc) Three 200mg tabs four times a day (every meal & bedtime) °3. Acetaminophen (Tylenol, etc) 500-650mg four times a day (every meal & bedtime) °4. A prescription for pain medication (such as oxycodone, hydrocodone, etc) should be given to you upon discharge. Take your pain medication as prescribed.  °1. If you are having problems/concerns with the prescription medicine (does not control pain, nausea, vomiting, rash, itching, etc), please call us [phone]  [phone] to see if we need to switch you to a different pain medicine that will work better for you and/or control your side effect better. °2. If you need a refill on your pain medication, please contact your pharmacy. They will contact our office to request authorization. Prescriptions will not be filled after 5 pm or on week-ends. °4. Avoid getting constipated. Between the surgery and the pain medications, it is common to experience some constipation. Increasing fluid intake and taking a fiber supplement (such as Metamucil, Citrucel, FiberCon, MiraLax, etc) 1-2 times a day regularly will usually help prevent this problem from occurring. A mild laxative (prune juice, Milk of Magnesia, MiraLax, etc) should be taken according to package directions if there are no bowel movements after 48 hours.  °5. Watch out for diarrhea. If you have many loose bowel movements, simplify your diet to bland foods & liquids for a few days. Stop any stool softeners and decrease your fiber supplement. Switching to mild anti-diarrheal medications (Kayopectate, Pepto Bismol) can help. If this worsens or does not improve, please call us. °6. Wash / shower every day. You may shower over the dressings as they are waterproof. Continue to shower over incision(s) after the dressing is off. °7. Remove your waterproof bandages 5 days after surgery. You may leave the incision open to air. You may replace a dressing/Band-Aid to cover the incision for comfort if you wish.  °8. ACTIVITIES as tolerated:  °1. You may resume regular (light) daily activities beginning the next day--such as daily self-care, walking, climbing stairs--gradually   increasing activities as tolerated. If you can walk 30 minutes without difficulty, it is safe to try more intense activity such as jogging, treadmill, bicycling, low-impact aerobics, swimming, etc. 2. Save the most intensive and strenuous activity for last such as sit-ups, heavy lifting, contact sports, etc Refrain  from any heavy lifting or straining until you are off narcotics for pain control.  3. DO NOT PUSH THROUGH PAIN. Let pain be your guide: If it hurts to do something, don't do it. Pain is your body warning you to avoid that activity for another week until the pain goes down. 4. You may drive when you are no longer taking prescription pain medication, you can comfortably wear a seatbelt, and you can safely maneuver your car and apply brakes. 5. You may have sexual intercourse when it is comfortable.  9. FOLLOW UP in our office  1. Please call [facility] at [phone] to set up an appointment to see your surgeon in the office for a follow-up appointment approximately 2-3 weeks after your surgery. 2. Make sure that you call for this appointment the day you arrive home to insure a convenient appointment time.      10. IF YOU HAVE DISABILITY OR FAMILY LEAVE FORMS, BRING THEM TO THE               OFFICE FOR PROCESSING.   WHEN TO CALL us [phone]:  1. Poor pain control 2. Reactions / problems with new medications (rash/itching, nausea, etc)  3. Fever over 101.5 F (38.5 C) 4. Inability to urinate 5. Nausea and/or vomiting 6. Worsening swelling or bruising 7. Continued bleeding from incision. 8. Increased pain, redness, or drainage from the incision  The clinic staff is available to answer your questions during regular business hours (8:30am-5pm). Please dont hesitate to call and ask to speak to one of our nurses for clinical concerns.  If you have a medical emergency, go to the nearest emergency room or call 911.  A surgeon from [facility] is always on call at the [facility], [place], [place], [place], [place] [address] ?  MAIN: [phone] ? TOLL FREE: [phone] ?  FAX [phone]  [email or web address]   Colostomy, Adult, Care After Refer to this sheet in the next few weeks. These instructions provide you with  information about caring for yourself after your procedure. Your health care provider may also give you more specific instructions. Your treatment has been planned according to current medical practices, but problems sometimes occur. Call your health care provider if you have any problems or questions after your procedure. What can I expect after the procedure? After the procedure, it is common to have:  Swelling at the opening that was created during the procedure (stoma).  Slight bleeding around the stoma.  Redness around the stoma.  Follow these instructions at home: Activity  Rest as needed while the stoma area heals.  Return to your normal activities as told by your health care provider. Ask your health care provider what activities are safe for you.  Avoid strenuous activity and abdominal exercises for 3 weeks or for as long as told by your health care provider.  Do not lift anything that is heavier than 10 lb (4.5 kg). Incision care   Follow instructions from your health care provider about how to take care of your incision. Make sure you: ? Wash your hands with soap and water before you change your  bandage (dressing). If soap and water are not available, use hand sanitizer. ? Change your dressing as told by your health care provider. ? Leave stitches (sutures), skin glue, or adhesive strips in place. These skin closures may need to stay in place for 2 weeks or longer. If adhesive strip edges start to loosen and curl up, you may trim the loose edges. Do not remove adhesive strips completely unless your health care provider tells you to do that. Stoma Care  Keep the stoma area clean.  Clean and dry the skin around the stoma each time you change the colostomy bag. To clean the stoma area: ? Use warm water and only use cleansers that are recommended by your health care provider. ? Rinse the stoma area with plain water. ? Dry the area well.  Use stoma powder or ointment on your  skin only as told by your health care provider. Do not use any other powders, gels, wipes, or creams on your skin.  Check the stoma area every day for signs of infection. Check for: ? More redness, swelling, or pain. ? More fluid or blood. ? Pus or warmth.  Measure the stoma opening regularly and record the size. Watch for changes. Share this information with your health care provider. Bathing  Do not take baths, swim, or use a hot tub until your health care provider approves. Ask your health care provider if you can take showers. You may be able to shower with or without the colostomy bag in place. If you bathe with the bag on, dry the bag afterward.  Avoid using harsh or oily soaps when you bathe. Colostomy Bag Care  Follow instructions from your health care provider about how to empty or change the colostomy bag.  Keep colostomy supplies with you at all times.  Store all supplies in a cool, dry place.  Empty the colostomy bag: ? Whenever it is one-third to one-half full. ? At bedtime.  Replace the bag every 2-4 days or as told by your health care provider. Driving  Do not drive for 24 hours if you received a sedative.  Do not drive or operate heavy machinery while taking prescription pain medicine. General instructions  Follow instructions from your health care provider about eating or drinking restrictions.  Take over-the-counter and prescription medicines only as told by your health care provider.  Avoid wearing clothes that are tight directly over your stoma.  Do not use any tobacco products, such as cigarettes, chewing tobacco, and e-cigarettes. If you need help quitting, ask your health care provider.  (Women) Ask your health care provider about becoming pregnant and about using birth control. Medicines may not be absorbed normally after the procedure.  Keep all follow-up visits as told by your health care provider. This is important. Contact a health care provider  if:  You are having trouble caring for your stoma or changing the colostomy bag.  You feel nauseous or you vomit.  You have a fever.  You havemore redness, swelling, or pain at the site of your stoma or around your anus.  You have more fluid or blood coming from your stoma or your anus.  Your stoma area feels warm to the touch.  You have pus coming from your stoma.  You notice a change in the size or appearance of the stoma.  You have abdominal pain, bloating, pressure, or cramping.  Your have stool more often or less often than your health care provider tells you to expect.  You are not making much urine. This may be a sign of dehydration. Get help right away if:  Your abdominal pain does not go away or it becomes severe.  You keep vomiting.  Your stool is not draining through the stoma.  You have chest pain or an irregular heartbeat. This information is not intended to replace advice given to you by your health care provider. Make sure you discuss any questions you have with your health care provider. Document Released: 11/14/2010 Document Revised: 11/02/2015 Document Reviewed: 03/07/2015 Elsevier Interactive Patient Education  2018 [employer].

## 2017-05-01 NOTE — Care Management Important Message (Signed)
Important Message  Patient Details  Name: Colin [name] MRN: [id number] Date of Birth: 1941-05-07   Medicare Important Message Given:  Yes    Erenest Rasher, RN 05/01/2017, 11:52 AM

## 2017-05-01 NOTE — Social Work (Signed)
Clinical Social Worker facilitated patient discharge including contacting patient family and facility to confirm patient discharge plans.  Clinical information faxed to facility and family agreeable with plan.    CSW arranged ambulance transport via [facility] to [facility].    RN to call [phone] to give report prior to discharge. Pt going to Room 211.  Clinical Social Worker will sign off for now as social work intervention is no longer needed. Please consult [place] again if new need arises.  Elissa Hefty, LCSW Clinical Social Worker [phone]

## 2017-05-01 NOTE — Progress Notes (Signed)
Patient being transferred to [facility]. He is alert and oriented. Report called to receiving nurse. Discharge instructions given to patient. Waiting for [facility].

## 2017-05-01 NOTE — Discharge Summary (Addendum)
Physician Discharge Summary  Colin [name] PPI:[id number] DOB: December 07, 1940 DOA: 04/18/2017  PCP: Redmond School, MD  Admit date: 04/18/2017 Discharge date: 05/01/2017  Time spent: 35 minutes  Recommendations for Outpatient Follow-up:  1. Patient will require skilled level care on d/c for management of wounds therapy etc 2. Suggest Chem-12 as well as CBC in about 1 week 3. Needs follow-up at urology office and general surgery office as per their instructions 4. Patient had a 2 x 1 x 3.5 cm wound with candidal overgrowth in perineum after surgery-needs repacking twice daily with saline moist conformer gauze and covered with an ABD pad 5. Ostomy size was 1 and 3 8 inches pink abutting and has been dressed with a 2 inch barrier ring-patient will need extensive education regarding the same at the skilled facility 6. Eventually patient may require a colonoscopy through the stoma if he ever does have a takedown as he had some mild bleeding during hospital stay to stabilized without intervention 7. Please pull the midline after completion of IV vancomycin and Zosyn on 05/10/2017 for treatment of MRSA bacteremia, rectourethral fistula with abscess  Discharge Diagnoses:  Active Problems:   Altered mental status   Acute renal failure (HCC)   Metabolic acidosis   Melena   Diarrhea in adult patient   Heme positive stool   Perineal abscess   Recto-prostatic fistula   MRSA bacteremia   Staphylococcus aureus bacteremia with sepsis (HCC)   Severe sepsis with septic shock (HCC)   Leukocytosis   Blood in stool   Discharge Condition: Improved  Diet recommendation: Soft diet for couple of days until tolerating well  Filed Weights   04/29/17 0437 04/30/17 0434 05/01/17 0524  Weight: 84.4 kg (186 lb 1.6 oz) 84.4 kg (186 lb) 86 kg (189 lb 8 oz)    History of present illness:  76 stage 3 CKD,  ETOHism, Hepatitis C, HTN,  Anemia,  Prostate Cancer,  Type 2 DM  who presented to [facility] on 10/12  with "black" stool for one month which had progressively worsened over 3 days.   Hemoglobin 8.8, WBC 41.2. C.Diff negative, CT A/P with apparent fistula between the prostate gland / prostatic urethra and the anterior wall of the rectum.   Foley was placed with black stool output noted.   Patient was transferred to [facility] for further management.   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   On 10/22, noted maroon-colored soft stools in colostomy and ABLA, hemoglobin dropped from 7.6 > 6.4, checking bleeding scan-was neg  GI consulted and they felt he did not need any further intervention and that the bleeding is stopped but recommended outpatient management see below  Hospital Course:  Rectourethral fistula General surgery and urology consulted and patient underwent combined suprapubic tube, diverting colostomy for rectourethral fistula with periurethral abscess drainage on 10/18. GI and surgery following.  Ostomy nurse using a 2 inch barrier ring To learn how to empty bag--will need to learn management of this Pain being controlled moderately  Fistula abscess Status post surgical drainage in OR 10/18. As per ID follow-up, plan to treat for 14 days with Zosyn using 10/18 as day 1. Left upper arm midline placed 10/17. As per urology follow-up 10/21, diligent wound care to the perineal abscess is recommended and he discussed with RN. Urology will follow. Wound nurse was consulted--2 X1 X3.5 wound with minimal serosanguineous drainage and candidate overgrowth-moist  conformer gauze covered with ABD pad  GI Bleed - Acute blood loss anemia  Follow CBCs closely and transfuse if hemoglobin is 7 or less. S/P 1 PRBC at surgery on 10/18. Post Hb 8.5 >8.2 >7.9 >7.6. Hemoglobin 7.6 yesterday to 6.4-->9 had 2 U prbc Suspect lower GI bleed earlier in admission prompting a bleeding scan -bleeding  scan negative, GI consulted and as the bleeding subsided on its own did not feel any further needs and placed back on a full diet on 10/25 Hemoglobin stabilized and no further bleeding discomfort  MRSA bacteremia  Rectourethral fistula and abscess ID directing care of this issue . As per ID follow-up, plan to treat minimum of 14 days using 10/18 as day 1--Stop date 11.3.18 Midline in place and will need to be pulled on stopping abx WBC stable in 11 range and has stabilized  Hypokalemia  Replace and follow as needed Currently 4.4  Non-anion gap metabolic acidosis Due to renal failure as well as possible GI bicarb losses.  Started oral sodium bicarbonate tablets 10/17, bicarb to 22 from 17.  Stopping tabs today Labs in am  Acute encephalopathy/? Portosystemic - As per chart review, not sure how much alcohol he uses. Concern for thiamine and folate deficiency, ordered supplements. Improved.  Hypernatremia  Reflective of free water deficit - corrected w/ free water resuscitation    Elevated Troponin in setting of demand ischemia  Troponin never signif elevated   HTN BP currently well controlled  Not currently on medications for this  AKI on CKD Stage 3 baseline Crt 1.4-1.8. Creatinine has now normalized. Holding home medication of Demadex 20 mg 4 times a day  Liver Cirrhosis - Chronic Hep C  S/p Rx Harvoni  ?Cellulitis to Right LE  No evidence on exam today to suggest an active infection - B LE venous duplex w/o DVT on 04/19/17. Stable without change.  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 for a short amount of time and now is back on soft  diet  Hypophosphatemia/hypomagnesemia/hypocalcemia - Replace as needed and follow. Corrected serum calcium is 7.6.  Consultants:  PCCM Gen Surgery Urology  Nephrology  ID  Antimicrobials:  Vancomycin 10/12 > stop date 05/10/2017 Meropenem 10/12 >10/17 Zosyn 10/17>update 05/10/2017   Discharge Exam: Vitals:   04/30/17 2036 05/01/17 0524  BP: 101/60 (!) 108/58  Pulse: 86 88  Resp: 19 19  Temp: 99.1 F (37.3 C) 99.1 F (37.3 C)  SpO2: 98% 99%   Some abdominal pain and tenderness in the right lower quadrant No rebound no guarding Passing gas as well as stool into ostomy General: Alert pleasant but hard of hearing and does not seem to understand clearly Cardiovascular: S1 S2 no murmur Respiratory: Clinically clear Abdomen soft no rebound Ostomy seems to be well affixed Packing to buttocks wounds noted did not remove Lower extremities is soft nontender No arcus senilis No icterus  Discharge Instructions    Current Discharge Medication List    START taking these medications   Details  folic acid (FOLVITE) 1 MG tablet Take 1 tablet (1 mg total) by mouth daily. Qty: 4 tablet, Refills: 0    oxyCODONE (OXY IR/ROXICODONE) 5 MG immediate release tablet Take 1-2  tablets (5-10 mg total) by mouth every 6 (six) hours as needed (pain). Qty: 4 tablet, Refills: 0    piperacillin-tazobactam (ZOSYN) 3.375 GM/50ML IVPB Inject 50 mLs (3.375 g total) into the vein every 8 (eight) hours. Qty: 50 mL    Vancomycin (VANCOCIN) 750-5 MG/150ML-% SOLN Inject 150 mLs (750 mg total) into the vein daily. Qty: 4000 mL      CONTINUE these medications which have NOT CHANGED   Details  ferrous sulfate 325 (65 FE) MG tablet Take 325 mg by mouth 3 (three) times daily with meals.    Multiple Vitamins-Minerals (MULTIVITAMIN WITH MINERALS) tablet Take 1 tablet by mouth daily.        STOP taking these medications     cephALEXin (KEFLEX) 500 MG capsule      HYDROcodone-acetaminophen  (LORCET HD) 10-325 MG per tablet      torsemide (DEMADEX) 20 MG tablet        No Known Allergies    The results of significant diagnostics from this hospitalization (including imaging, microbiology, ancillary and laboratory) are listed below for reference.    Significant Diagnostic Studies: Ct Abdomen Pelvis Wo Contrast  Result Date: 04/18/2017 CLINICAL DATA:  Black diarrhea. Prostate cancer status post cryoablation and November 2017 EXAM: CT ABDOMEN AND PELVIS WITHOUT CONTRAST TECHNIQUE: Multidetector CT imaging of the abdomen and pelvis was performed following the standard protocol without IV contrast. COMPARISON:  CT 10/13/2015 FINDINGS: Lower chest: Lung bases are clear. Hepatobiliary: No focal hepatic lesion. No biliary duct dilatation. Gallbladder is normal. Common bile duct is normal. Pancreas: Pancreas is normal. No ductal dilatation. No pancreatic inflammation. Spleen: Normal spleen Adrenals/urinary tract: Adrenal glands are normal. Kidneys are normal without obstruction. There is gas within bladder. There is Foley catheter expanded within the prostatic urethra of the prostate gland (image 63, series 6). There is gas within the prostate gland anterior to the Foley bulb in posterior to the bulb surrounding the urethra. Gas-filled track between the anterior wall the rectum and the prostate gland / urethra (image 76, series 2). Small gas within the anterior wall of the rectum (image 74, series 2). Additionally there is a tract leading inferiorly from the prostate gland/ urethra the LEFT perineum (image 85 and 90. Series 2). There small abscess at the base the bladder base the penis measuring 2.9 by 1.7 cm. Stomach/Bowel: Stomach, small-bowel, appendix cecum normal. The colon rectosigmoid colon normal. Gas and anterior wall the rectum and potential communication with the prostate gland/3 described above. No abscess formation within the pelvis peritoneal or retroperitoneal space.  Vascular/Lymphatic: Abdominal aorta is normal caliber. There is no retroperitoneal or periportal lymphadenopathy. No pelvic lymphadenopathy. Reproductive: Prostate gland as described above within the bladder section Other: No free fluid. Musculoskeletal: No aggressive osseous lesion IMPRESSION: 1. 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 2. Extensive gas within the prostate gland. The Foley catheter bulb is expanded within the prostatic urethra. Recommend deflation and advancement of the Foley bulb into the lumen of the bladder. 3. Communication between the prostate gland / prostatic urethra / rectum and the base the penis and LEFT perineum consistent with a fistulous track and abscess formation. 4. Recommend urology consultation. Findings conveyed toKATHLEEN MCMANUS on 04/18/2017  at21:48. Electronically Signed   By: Suzy Bouchard M.D.   On: 04/18/2017 21:48   Ct Head Wo Contrast  Result Date: 04/18/2017 CLINICAL DATA:  Confusion and  altered mental status. EXAM: CT HEAD WITHOUT CONTRAST TECHNIQUE: Contiguous axial images were obtained from the base of the skull through the vertex without intravenous contrast. COMPARISON:  None. FINDINGS: Brain: There is an age-indeterminate left frontoparietal subdural hematoma, measuring 10 mm in maximum thickness. No evidence of significant midline shift. No evidence of acute infarction or hydrocephalus. Moderate brain parenchymal volume loss and periventricular microangiopathy. Vascular: No hyperdense vessel or unexpected calcification. Skull: Normal. Negative for fracture or focal lesion. Sinuses/Orbits: No acute finding. Other: None. IMPRESSION: Age-indeterminate left frontoparietal subdural hematoma measuring 10 mm in maximum thickness. No significant midline shift. Moderate brain parenchymal volume loss and periventricular microangiopathy. These  results were called by telephone at the time of interpretation on 04/18/2017 at 9:33 pm to Dr. Francine Graven , who verbally acknowledged these results. Electronically Signed   By: Fidela Salisbury M.D.   On: 04/18/2017 21:41   Ct Pelvis Wo Contrast  Result Date: 04/19/2017 CLINICAL DATA:  Rectourethral fistula, evaluate Foley catheter placement EXAM: CT PELVIS WITHOUT CONTRAST TECHNIQUE: Multidetector CT imaging of the pelvis was performed following the standard protocol without intravenous contrast. COMPARISON:  04/18/2017 FINDINGS: Urinary Tract: Thick-walled bladder with nondependent gas (Series 3/image 29). Bowel:  Visualized bowel is grossly unremarkable. Vascular/Lymphatic: No evidence of aneurysm. Mild atherosclerotic calcifications. No suspicious pelvic lymphadenopathy. Reproductive: Necrotic prostate bed status post cryoablation with trace gas anteriorly (series 3/ image 36). Foley catheter bulb inferiorly along the prostate bed (presumably in the prostatic urethra). Other: Known fistulous tract between the anterior rectum, necrotic prostate bed, and posterior bladder wall is not well visualized on the current study, likely due to the presence of the Foley catheter bulb in this location (series 3/ image 37). Again noted is fluid/phlegmonous change along the left perineum measuring approximately 3.2 x 2.3 cm (series 3/ image 49). Associated stranding and soft tissue gas in the left perineal region (series 3/image 53), suggesting cellulitis. Musculoskeletal: Degenerative changes of the lower lumbar spine. IMPRESSION: Malpositioned Foley catheter pole within the prostatic urethra along the inferior necrotic prostate bed. Known rectal urethral fistulous tract is obscured by the presence of the Foley catheter bulb in this location. Again noted is fluid/phlegmonous change with soft tissue gas along the left perineum, as described above, suggesting cellulitis. Electronically Signed   By: Julian Hy M.D.   On: 04/19/2017 09:33   Nm Gi Blood Loss  Result Date: 04/28/2017 CLINICAL DATA:  GI bleed EXAM: NUCLEAR MEDICINE GASTROINTESTINAL BLEEDING SCAN TECHNIQUE: Sequential abdominal images were obtained following intravenous administration of Tc-16m labeled red blood cells. RADIOPHARMACEUTICALS:  27.2 mCi Tc-14m in-vitro labeled red cells. COMPARISON:  None. FINDINGS: Planar imaging of the abdomen and pelvis after injection of tagged red blood cells reveals no evidence for active extravasation over 2 hours of observation. IMPRESSION: No findings to suggest active GI bleeding during the 2 hour period of observation. Electronically Signed   By: Misty Stanley M.D.   On: 04/28/2017 20:12   US Abdomen Complete  Result Date: 04/19/2017 CLINICAL DATA:  Initial evaluation for acute abdominal pain for 1 day. EXAM: ABDOMEN ULTRASOUND COMPLETE COMPARISON:  Prior CT from 04/18/2017. FINDINGS: Gallbladder: Shadowing echogenic calculi present within the gallbladder lumen, largest of which measures 1.1 cm. Gallbladder wall measure within normal limits at 2.6 mm. No free pericholecystic fluid. No sonographic Murphy sign elicited on exam. Common bile duct: Diameter: 5.8 mm Liver: No focal lesion identified. Within normal limits in parenchymal echogenicity. Portal vein is patent on color Doppler imaging with normal direction  of blood flow towards the liver. IVC: No abnormality visualized. Pancreas: Visualized portion unremarkable. Spleen: Size and appearance within normal limits. Right Kidney: Length: 12.2 cm. Echogenicity within normal limits. No solid mass visualized. Mild hydronephrosis. 1.8 x 1.2 x 2.0 cm cyst noted. Cortical thinning noted. Left Kidney: Length: 10.6 cm. Echogenicity within normal limits. No solid mass visualized. Mild hydronephrosis. Cortical thinning noted. Abdominal aorta: No aneurysm visualized. Other findings: None. IMPRESSION: 1. Cholelithiasis with no other sonographic features to  suggest acute cholecystitis. No biliary dilatation. 2. Mild bilateral hydronephrosis. 3. Chronic renal cortical thinning bilaterally. Electronically Signed   By: Jeannine Boga M.D.   On: 04/19/2017 05:44   Dg Chest Port 1 View  Result Date: 04/18/2017 CLINICAL DATA:  Diarrhea and confusion.  Altered mental status. EXAM: PORTABLE CHEST 1 VIEW COMPARISON:  12/28/2009 FINDINGS: A single [facility] portable view of the chest demonstrates no focal airspace consolidation or alveolar edema. The lungs are grossly clear. There is no large effusion or pneumothorax. Cardiac and mediastinal contours appear unremarkable. IMPRESSION: No active disease. Electronically Signed   By: Andreas Newport M.D.   On: 04/18/2017 20:44    Microbiology: No results found for this or any previous visit (from the past 240 hour(s)).   Labs: Basic Metabolic Panel:  Recent Labs Lab 04/25/17 0433  04/27/17 0434 04/28/17 0349 04/29/17 0801 04/30/17 0317 05/01/17 0656  NA 137  < > 137 138 136 133* 134*  K 3.9  < > 3.4* 4.1 3.7 4.4 4.2  CL 112*  < > 113* 115* 110 104 103  CO2 15*  < > 19* 17* 19* 22 20*  GLUCOSE 147*  < > 103* 110* 101* 99 119*  BUN 29*  < > 16 11 13 11 13   CREATININE 1.27*  < > 1.19 1.01 1.38* 1.31* 1.38*  CALCIUM 7.3*  < > 7.0* 5.4* 6.9* 7.1* 7.2*  MG  --   --   --   --  1.3*  --   --   PHOS 3.7  --   --  2.0* 3.2  --   --   < > = values in this interval not displayed. Liver Function Tests:  Recent Labs Lab 04/25/17 0433 04/28/17 0349 04/29/17 0801 04/30/17 0317  AST  --   --  50* 62*  ALT  --   --  32 46  ALKPHOS  --   --  252* 239*  BILITOT  --   --  0.7 1.0  PROT  --   --  4.7* 4.9*  ALBUMIN 1.9* 1.2* 1.6* 1.6*   No results for input(s): LIPASE, AMYLASE in the last 168 hours. No results for input(s): AMMONIA in the last 168 hours. CBC:  Recent Labs Lab 04/28/17 1829 04/28/17 2313 04/29/17 0801 04/30/17 0317 05/01/17 0656  WBC 11.8* 11.1* 11.8* 11.5* 9.6  NEUTROABS  --    --   --  8.0* 6.5  HGB 9.3* 9.2* 9.0* 10.2* 9.6*  HCT 28.6* 28.6* 28.0* 30.9* 29.4*  MCV 84.6 84.6 84.6 84.9 85.7  PLT 200 217 198 209 235   Cardiac Enzymes: No results for input(s): CKTOTAL, CKMB, CKMBINDEX, TROPONINI in the last 168 hours. BNP: BNP (last 3 results) No results for input(s): BNP in the last 8760 hours.  ProBNP (last 3 results) No results for input(s): PROBNP in the last 8760 hours.  CBG:  Recent Labs Lab 04/24/17 1204  GLUCAP 154*       Signed:  Nita Sells MD  [facility] 05/01/2017, 9:43 AM

## 2017-05-01 NOTE — Clinical Social Work Placement (Signed)
   CLINICAL SOCIAL WORK PLACEMENT  NOTE  Date:  05/01/2017  Patient Details  Name: Colin [name] MRN: [id number] Date of Birth: July 27, 1940  Clinical Social Work is seeking post-discharge placement for this patient at the [place] level of care (*CSW will initial, date and re-position this form in  chart as items are completed):  Yes   Patient/family provided with [place] Work Department's list of facilities offering this level of care within the geographic area requested by the patient (or if unable, by the patient's family).  Yes   Patient/family informed of their freedom to choose among providers that offer the needed level of care, that participate in Medicare, Medicaid or managed care program needed by the patient, have an available bed and are willing to accept the patient.  Yes   Patient/family informed of [facility]'s ownership interest in [facility] and [facility], as well as of the fact that they are under no obligation to receive care at these facilities.  PASRR submitted to EDS on       PASRR number received on 04/25/17     Existing PASRR number confirmed on       FL2 transmitted to all facilities in geographic area requested by pt/family on 04/25/17     FL2 transmitted to all facilities within larger geographic area on       Patient informed that his/her managed care company has contracts with or will negotiate with certain facilities, including the following:        Yes   Patient/family informed of bed offers received.  Patient chooses bed at [facility] recommends and patient chooses bed at      Patient to be transferred to [facility] on 05/01/17.  Patient to be transferred to facility by [facility]     Patient family notified on 05/01/17 of transfer.  Name of family member notified:  son at bedside     PHYSICIAN Please prepare prescriptions     Additional Comment:     _______________________________________________ Normajean Baxter, LCSW 05/01/2017, 10:46 AM

## 2017-05-01 NOTE — Social Work (Signed)
CSW met with family and patient at bedside. Family has accepted bed offer from [facility]. CSW called Suanne Marker in admissions to confirm placement.   CSW will f/u for discharge today.  Elissa Hefty, LCSW Clinical Social Worker [phone]

## 2017-05-01 NOTE — Care Management Note (Signed)
Case Management Note  Patient Details  Name: Colin [name] MRN: [id number] Date of Birth: 12-Apr-1941  Subjective/Objective:  AMS, ARF, metabolic acidosis, severe sepsis                  Action/Plan: Discharge Planning: Chart reviewed. Scheduled dc to SNF. CSW following for SNF placement.   PCP Redmond School MD  Expected Discharge Date:  05/01/17               Expected Discharge Plan:  [place]  In-House Referral:  Clinical Social Work  Discharge planning Services  CM Consult  Post Acute Care Choice:  NA Choice offered to:  NA  DME Arranged:  N/A DME Agency:  NA  HH Arranged:  NA HH Agency:  NA  Status of Service:  Completed, signed off  If discussed at [place] of Stay Meetings, dates discussed:    Additional Comments:  Erenest Rasher, RN 05/01/2017, 10:57 AM

## 2017-05-02 ENCOUNTER — Encounter: Payer: Self-pay | Admitting: Adult Health

## 2017-05-02 ENCOUNTER — Non-Acute Institutional Stay (SKILLED_NURSING_FACILITY): Payer: Medicare Other | Admitting: Adult Health

## 2017-05-02 DIAGNOSIS — N179 Acute kidney failure, unspecified: Secondary | ICD-10-CM | POA: Diagnosis not present

## 2017-05-02 DIAGNOSIS — D62 Acute posthemorrhagic anemia: Secondary | ICD-10-CM | POA: Diagnosis not present

## 2017-05-02 DIAGNOSIS — N360 Urethral fistula: Secondary | ICD-10-CM | POA: Diagnosis not present

## 2017-05-02 DIAGNOSIS — Z87898 Personal history of other specified conditions: Secondary | ICD-10-CM

## 2017-05-02 DIAGNOSIS — R7881 Bacteremia: Secondary | ICD-10-CM | POA: Diagnosis not present

## 2017-05-02 DIAGNOSIS — F1011 Alcohol abuse, in remission: Secondary | ICD-10-CM

## 2017-05-02 DIAGNOSIS — R5381 Other malaise: Secondary | ICD-10-CM

## 2017-05-02 NOTE — Progress Notes (Signed)
DATE:  05/02/2017   MRN:  [id number]  BIRTHDAY: 1940/11/21  Facility:  Nursing Home Location:  [facility] Room Number: 211-A  LEVEL OF CARE:  SNF (31)  Contact Information    Name Relation Home Work Mobile   Gibson Son [phone]         Code Status History    Date Active Date Inactive Code Status Order ID Comments User Context   04/19/2017  3:00 AM 05/01/2017  4:58 PM Full Code [id number]  Tobey Grim, NP Inpatient       Chief Complaint  Patient presents with  . Acute Visit    Hospital followup     HISTORY OF PRESENT ILLNESS:  This is a 76-YO male seen for hospital followup.  He was admitted to [facility] for short-term rehabilitation on 05/01/2017 following an admission at [facility] 10/12-10/25-2018. He has a PMH of ETOH abuse, IV drug abuse, prostate cancer, type 2 DM, anemia, hepatitis C, and CKD stage 3. He presented to [facility] ED with worsening "black" stool for which he has been having for a month. He was found to have hgb 8.8 and wbc 41.2. CT AP showed fistula between the prostate gland/prostatic urethra and the anterior wall of the rectum with a gas-filled track between the prostate gland and rectum. Foley was placed with black stool output. He was transferred to [facility] for further management. Infectious was consulted for MRSA bacteremia.  He had a laparoscopic creation of an end sigmoid colostomy by surgery and drainage of perineal abscess, placement of open suprapubic tube, 22 French Foley by urology, both done on 04/24/17.  Gastroenterology was consulted and thought that patient is not having a rapid UGI bleed and might probably due to distal colonic source and maybe related to ostomy. GI suggested if significant bleeding noted patient will need sigmoidoscopy colonoscopy via colostomy. He was seen in his room today. He verbalized having well-controlled pain.      PAST MEDICAL HISTORY:  Past  Medical History:  Diagnosis Date  . Arthritis   . Bilateral lower extremity edema   . Chronic venous insufficiency   . CKD (chronic kidney disease), stage III (HCC)   . History of alcohol abuse    none since 2013 per pt  . History of chronic hepatitis    Genotype 1, FO/F1-- treated w/ harvoni (completed May 2016)  . History of external beam radiation therapy    2007 prostate  . History of intravenous drug use in remission    per pt in remission since 1980's  . HOH (hard of hearing)   . Hypertension   . Normocytic anemia   . Recurrent prostate cancer [facility]) urologist-  dr Mena Goes   dx 2007  s/p  external beam radioation therapy/ recurrent 12-2015  Gleason 4+4  . Type 2 diabetes, diet controlled (HCC)    no meds since 2010 approx     CURRENT MEDICATIONS: Reviewed  Patient's Medications  New Prescriptions   No medications on file  Previous Medications   FERROUS SULFATE 325 (65 FE) MG TABLET    Take 325 mg by mouth 3 (three) times daily with meals.   FOLIC ACID (FOLVITE) 1 MG TABLET    Take 1 tablet (1 mg total) by mouth daily.   MULTIPLE VITAMINS-MINERALS (MULTIVITAMIN WITH MINERALS) TABLET    Take 1 tablet by mouth daily.     OXYCODONE-ACETAMINOPHEN (PERCOCET) 7.5-325 MG TABLET    Take 1 tablet  by mouth every 6 (six) hours as needed for severe pain.   PIPERACILLIN-TAZOBACTAM (ZOSYN) 3.375 GM/50ML IVPB    Inject 50 mLs (3.375 g total) into the vein every 8 (eight) hours.   VANCOMYCIN (VANCOCIN) 750-5 MG/150ML-% SOLN    Inject 150 mLs (750 mg total) into the vein daily.  Modified Medications   No medications on file  Discontinued Medications   OXYCODONE (OXY IR/ROXICODONE) 5 MG IMMEDIATE RELEASE TABLET    Take 1-2 tablets (5-10 mg total) by mouth every 6 (six) hours as needed (pain).     No Known Allergies   REVIEW OF SYSTEMS:  GENERAL: no change in appetite, no fatigue, no weight changes, no fever, chills or weakness MOUTH and THROAT: Denies oral discomfort, gingival pain  or bleeding   RESPIRATORY: no cough, SOB, DOE, wheezing, hemoptysis CARDIAC: no chest pain, edema or palpitations GI: no  heart burn, nausea or vomiting PSYCHIATRIC: Denies feeling of depression or anxiety. No report of hallucinations, insomnia, paranoia, or agitation    PHYSICAL EXAMINATION  GENERAL APPEARANCE: Well nourished. In no acute distress. Normal body habitus SKIN:  Has surgical wound on LLQ with staples, perineum wound with dressing MOUTH and THROAT: Lips are without lesions. Oral mucosa is moist and without lesions.  RESPIRATORY: breathing is even & unlabored, BS CTAB CARDIAC: RRR, no murmur,no extra heart sounds, no edema, left upper arm SL midline GI: abdomen soft, normal BS, has LLQ colostomy LLQ with black stool EXTREMITIES:  Able to move X 4 extremities PSYCHIATRIC: Alert to self, disoriented to time and place. Affect and behavior are appropriate   LABS/RADIOLOGY: Labs reviewed: Basic Metabolic Panel:  Recent Labs  82/95/62 0356 04/24/17 0614 04/25/17 0433  04/28/17 0349 04/29/17 0801 04/30/17 0317 05/01/17 0656  NA 137 135 137  < > 138 136 133* 134*  K 4.2 4.1 3.9  < > 4.1 3.7 4.4 4.2  CL 116* 114* 112*  < > 115* 110 104 103  CO2 13* 13* 15*  < > 17* 19* 22 20*  GLUCOSE 90 102* 147*  < > 110* 101* 99 119*  BUN 56* 37* 29*  < > 11 13 11 13   CREATININE 1.25* 1.19 1.27*  < > 1.01 1.38* 1.31* 1.38*  CALCIUM 7.7* 7.3* 7.3*  < > 5.4* 6.9* 7.1* 7.2*  MG 1.6* 1.9  --   --   --  1.3*  --   --   PHOS <1.0* 2.0* 3.7  --  2.0* 3.2  --   --   < > = values in this interval not displayed. Liver Function Tests:  Recent Labs  04/23/17 0356  04/28/17 0349 04/29/17 0801 04/30/17 0317  AST 40  --   --  50* 62*  ALT 20  --   --  32 46  ALKPHOS 210*  --   --  252* 239*  BILITOT 0.7  --   --  0.7 1.0  PROT 5.3*  --   --  4.7* 4.9*  ALBUMIN 1.8*  < > 1.2* 1.6* 1.6*  < > = values in this interval not displayed.  Recent Labs  04/18/17 1909 04/21/17 1151    AMMONIA 92* 20   CBC:  Recent Labs  04/18/17 1909  04/29/17 0801 04/30/17 0317 05/01/17 0656  WBC 41.2*  < > 11.8* 11.5* 9.6  NEUTROABS 39.3*  --   --  8.0* 6.5  HGB 8.8*  < > 9.0* 10.2* 9.6*  HCT 26.3*  < > 28.0* 30.9* 29.4*  MCV 82.7  < > 84.6 84.9 85.7  PLT 404*  < > 198 209 235  < > = values in this interval not displayed. Cardiac Enzymes:  Recent Labs  04/18/17 1909 04/19/17 0443 04/19/17 1441  TROPONINI 0.04* 0.05* 0.04*   CBG:  Recent Labs  04/21/17 2006 04/21/17 2326 04/24/17 1204  GLUCAP 130* 115* 154*      Ct Abdomen Pelvis Wo Contrast  Result Date: 04/18/2017 CLINICAL DATA:  Black diarrhea. Prostate cancer status post cryoablation and November 2017 EXAM: CT ABDOMEN AND PELVIS WITHOUT CONTRAST TECHNIQUE: Multidetector CT imaging of the abdomen and pelvis was performed following the standard protocol without IV contrast. COMPARISON:  CT 10/13/2015 FINDINGS: Lower chest: Lung bases are clear. Hepatobiliary: No focal hepatic lesion. No biliary duct dilatation. Gallbladder is normal. Common bile duct is normal. Pancreas: Pancreas is normal. No ductal dilatation. No pancreatic inflammation. Spleen: Normal spleen Adrenals/urinary tract: Adrenal glands are normal. Kidneys are normal without obstruction. There is gas within bladder. There is Foley catheter expanded within the prostatic urethra of the prostate gland (image 63, series 6). There is gas within the prostate gland anterior to the Foley bulb in posterior to the bulb surrounding the urethra. Gas-filled track between the anterior wall the rectum and the prostate gland / urethra (image 76, series 2). Small gas within the anterior wall of the rectum (image 74, series 2). Additionally there is a tract leading inferiorly from the prostate gland/ urethra the LEFT perineum (image 85 and 90. Series 2). There small abscess at the base the bladder base the penis measuring 2.9 by 1.7 cm. Stomach/Bowel: Stomach, small-bowel,  appendix cecum normal. The colon rectosigmoid colon normal. Gas and anterior wall the rectum and potential communication with the prostate gland/3 described above. No abscess formation within the pelvis peritoneal or retroperitoneal space. Vascular/Lymphatic: Abdominal aorta is normal caliber. There is no retroperitoneal or periportal lymphadenopathy. No pelvic lymphadenopathy. Reproductive: Prostate gland as described above within the bladder section Other: No free fluid. Musculoskeletal: No aggressive osseous lesion IMPRESSION: 1. 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 2. Extensive gas within the prostate gland. The Foley catheter bulb is expanded within the prostatic urethra. Recommend deflation and advancement of the Foley bulb into the lumen of the bladder. 3. Communication between the prostate gland / prostatic urethra / rectum and the base the penis and LEFT perineum consistent with a fistulous track and abscess formation. 4. Recommend urology consultation. Findings conveyed toKATHLEEN MCMANUS on 04/18/2017  at21:48. Electronically Signed   By: Genevive Bi M.D.   On: 04/18/2017 21:48   Ct Head Wo Contrast  Result Date: 04/18/2017 CLINICAL DATA:  Confusion and altered mental status. EXAM: CT HEAD WITHOUT CONTRAST TECHNIQUE: Contiguous axial images were obtained from the base of the skull through the vertex without intravenous contrast. COMPARISON:  None. FINDINGS: Brain: There is an age-indeterminate left frontoparietal subdural hematoma, measuring 10 mm in maximum thickness. No evidence of significant midline shift. No evidence of acute infarction or hydrocephalus. Moderate brain parenchymal volume loss and periventricular microangiopathy. Vascular: No hyperdense vessel or unexpected calcification. Skull: Normal. Negative for fracture or focal lesion. Sinuses/Orbits: No acute  finding. Other: None. IMPRESSION: Age-indeterminate left frontoparietal subdural hematoma measuring 10 mm in maximum thickness. No significant midline shift. Moderate brain parenchymal volume loss and periventricular microangiopathy. These results were called by telephone at the time of interpretation on 04/18/2017 at  9:33 pm to Dr. Samuel Jester , who verbally acknowledged these results. Electronically Signed   By: Ted Mcalpine M.D.   On: 04/18/2017 21:41   Ct Pelvis Wo Contrast  Result Date: 04/19/2017 CLINICAL DATA:  Rectourethral fistula, evaluate Foley catheter placement EXAM: CT PELVIS WITHOUT CONTRAST TECHNIQUE: Multidetector CT imaging of the pelvis was performed following the standard protocol without intravenous contrast. COMPARISON:  04/18/2017 FINDINGS: Urinary Tract: Thick-walled bladder with nondependent gas (Series 3/image 29). Bowel:  Visualized bowel is grossly unremarkable. Vascular/Lymphatic: No evidence of aneurysm. Mild atherosclerotic calcifications. No suspicious pelvic lymphadenopathy. Reproductive: Necrotic prostate bed status post cryoablation with trace gas anteriorly (series 3/ image 36). Foley catheter bulb inferiorly along the prostate bed (presumably in the prostatic urethra). Other: Known fistulous tract between the anterior rectum, necrotic prostate bed, and posterior bladder wall is not well visualized on the current study, likely due to the presence of the Foley catheter bulb in this location (series 3/ image 37). Again noted is fluid/phlegmonous change along the left perineum measuring approximately 3.2 x 2.3 cm (series 3/ image 49). Associated stranding and soft tissue gas in the left perineal region (series 3/image 53), suggesting cellulitis. Musculoskeletal: Degenerative changes of the lower lumbar spine. IMPRESSION: Malpositioned Foley catheter pole within the prostatic urethra along the inferior necrotic prostate bed. Known rectal urethral fistulous tract is  obscured by the presence of the Foley catheter bulb in this location. Again noted is fluid/phlegmonous change with soft tissue gas along the left perineum, as described above, suggesting cellulitis. Electronically Signed   By: Charline Bills M.D.   On: 04/19/2017 09:33   Nm Gi Blood Loss  Result Date: 04/28/2017 CLINICAL DATA:  GI bleed EXAM: NUCLEAR MEDICINE GASTROINTESTINAL BLEEDING SCAN TECHNIQUE: Sequential abdominal images were obtained following intravenous administration of Tc-37m labeled red blood cells. RADIOPHARMACEUTICALS:  27.2 mCi Tc-5m in-vitro labeled red cells. COMPARISON:  None. FINDINGS: Planar imaging of the abdomen and pelvis after injection of tagged red blood cells reveals no evidence for active extravasation over 2 hours of observation. IMPRESSION: No findings to suggest active GI bleeding during the 2 hour period of observation. Electronically Signed   By: Kennith Center M.D.   On: 04/28/2017 20:12   US Abdomen Complete  Result Date: 04/19/2017 CLINICAL DATA:  Initial evaluation for acute abdominal pain for 1 day. EXAM: ABDOMEN ULTRASOUND COMPLETE COMPARISON:  Prior CT from 04/18/2017. FINDINGS: Gallbladder: Shadowing echogenic calculi present within the gallbladder lumen, largest of which measures 1.1 cm. Gallbladder wall measure within normal limits at 2.6 mm. No free pericholecystic fluid. No sonographic Murphy sign elicited on exam. Common bile duct: Diameter: 5.8 mm Liver: No focal lesion identified. Within normal limits in parenchymal echogenicity. Portal vein is patent on color Doppler imaging with normal direction of blood flow towards the liver. IVC: No abnormality visualized. Pancreas: Visualized portion unremarkable. Spleen: Size and appearance within normal limits. Right Kidney: Length: 12.2 cm. Echogenicity within normal limits. No solid mass visualized. Mild hydronephrosis. 1.8 x 1.2 x 2.0 cm cyst noted. Cortical thinning noted. Left Kidney: Length: 10.6 cm.  Echogenicity within normal limits. No solid mass visualized. Mild hydronephrosis. Cortical thinning noted. Abdominal aorta: No aneurysm visualized. Other findings: None. IMPRESSION: 1. Cholelithiasis with no other sonographic features to suggest acute cholecystitis. No biliary dilatation. 2. Mild bilateral hydronephrosis. 3. Chronic renal cortical thinning bilaterally. Electronically Signed   By: Rise Mu M.D.   On: 04/19/2017 05:44   Dg Chest Port 1 View  Result Date: 04/18/2017 CLINICAL DATA:  Diarrhea and confusion.  Altered mental status. EXAM: PORTABLE CHEST 1 VIEW COMPARISON:  12/28/2009 FINDINGS: A single AP portable view of the chest demonstrates no focal airspace consolidation or alveolar edema. The lungs are grossly clear. There is no large effusion or pneumothorax. Cardiac and mediastinal contours appear unremarkable. IMPRESSION: No active disease. Electronically Signed   By: Ellery Plunk M.D.   On: 04/18/2017 20:44    ASSESSMENT/PLAN:  1. Physical deconditioning - for rehabilitation, PT and OT, for therapeutic strengthening exercises, fall precautions   2. Rectourethral fistula - S/P combined suprapubic tube, day 13 colostomy for rectourethral fistula with periurethral abscess drainage on 10/18, follow-up with surgery and urology, continue Percocet 7. 5-325 milligrams 1 tab every 6 hours when necessary for pain, Zosyn 0.375 g/50 mL IV every 8 hours till 05/10/17 and vancomycin 750 mg/150 mL IV daily till 05/10/17, discontinue midline on 05/11/17, daily wound treatment of surgical wounds and colostomy site    3. Anemia due to acute blood loss -  S/P transfusion of 1 unit PRBC, decrease ferrous sulfate EC 325 mg from 3X/day to 2X/day, check CBC in 1 week Lab Results  Component Value Date   HGB 9.6 (L) 05/01/2017     4. MRSA bacteremia - continue Zosyn 0.375 g/50 mL IV every 8 hours till 05/10/17 and vancomycin 750 mg/150 mL IV daily till 05/10/17, discontinue midline  on 05/11/17   5. History of ETOH abuse - continue folic acid 1 mg 1 tab daily  6. Acute kidney injury - check BMP in 1 week Lab Results  Component Value Date   CREATININE 1.38 (H) 05/01/2017       Goals of care:  Long-term care    Joliet Mallozzi C. Medina-Vargas - NP    [employer] [phone]

## 2017-05-06 ENCOUNTER — Non-Acute Institutional Stay (SKILLED_NURSING_FACILITY): Payer: Medicare Other | Admitting: Internal Medicine

## 2017-05-06 ENCOUNTER — Ambulatory Visit: Payer: Self-pay | Admitting: Urology

## 2017-05-06 ENCOUNTER — Encounter: Payer: Self-pay | Admitting: Internal Medicine

## 2017-05-06 DIAGNOSIS — R296 Repeated falls: Secondary | ICD-10-CM | POA: Diagnosis not present

## 2017-05-06 DIAGNOSIS — Z789 Other specified health status: Secondary | ICD-10-CM

## 2017-05-06 DIAGNOSIS — S065XAA Traumatic subdural hemorrhage with loss of consciousness status unknown, initial encounter: Secondary | ICD-10-CM

## 2017-05-06 DIAGNOSIS — S065X9A Traumatic subdural hemorrhage with loss of consciousness of unspecified duration, initial encounter: Secondary | ICD-10-CM | POA: Diagnosis not present

## 2017-05-06 DIAGNOSIS — N4289 Other specified disorders of prostate: Secondary | ICD-10-CM

## 2017-05-06 DIAGNOSIS — R4182 Altered mental status, unspecified: Secondary | ICD-10-CM

## 2017-05-06 NOTE — Assessment & Plan Note (Addendum)
Verify urologic and general surgery surgery follow-up Wound care Nurse monitor @ SNF

## 2017-05-06 NOTE — Assessment & Plan Note (Addendum)
Unable to care for self based on history of multiple falls w/o assessment or F/U Further mental status testing once the patient improves clinically His ex-wife plans to come to [facility] to care for the patient following his discharge from the SNF

## 2017-05-06 NOTE — Progress Notes (Signed)
NURSING HOME LOCATION:  [facility] ROOM NUMBER:  211-A  CODE STATUS:  Full Code  PCP:  [facility], [place] [id number]    This is a comprehensive admission note to [facility] performed on this date less than 30 days from date of admission. Included are preadmission medical/surgical history;reconciled medication list; family history; social history and comprehensive review of systems.  Corrections and additions to the records were documented . Comprehensive physical exam was also performed. Additionally a clinical summary was entered for each active diagnosis pertinent to this admission in the Problem List to enhance continuity of care.  HPI: The patient was hospitalized 10/12-10/25/18 , he presented in context of confusion and "black diarrhea" over the prior month. His son validates mental status deterioration over 3 days prior to admission. EMS found the patient at his home where he lives alone. The environment was soiled with feces. The patient was disheveled, confused, and wearing soiled clothes. According to patient's son the patient is usually alert and oriented 3, independent and well kept as to personal hygiene. The patient had fallen most recently 2 weeks prior to admission according to his son and did not seek medical attention . Actually the son feels the patient has fallen at least 4 times in the last several months. On one occasion he sustained a laceration to the back of the head, for which he also declined medical attention. Heating blanket was implemented because of temperature of 93. Clinically patient was dehydrated and was found to have acute on chronic renal insufficiency. IV boluses were initiated for systolic blood pressures in the 100-110 range. Fecal occult blood on the black stool was positive. There was black liquid emanating from the Foley catheter prompting CT of the abdomen and pelvis. This was performed without contrast  because the AKI. CT revealed apparent fistula between the prostate gland/prostatic urethra and the anterior wall of the rectum with gas-filled track between the prostate gland and rectum. Extensive gas was found within the prostate gland.  CT of the head revealed a age-indeterminate left frontoparietal subdural hematoma measuring 10 mm in maximum thickness. General surgery and urology consulted and patient underwent combined suprapubic tube placement, and diverging colostomy for rectourethral fistula with periurethral abscess drainage on 10/18.The rectourethral fistula and abscess were associated with MRSA bacteremia. ID consulted and recommended Zosyn and Vancomycin after venous access completed 10/17 in the upper arm. Wound care nurse was consulted for 2 x 1 x 3.5 cm wound associated with minimal serosanguineous drainage and candidal overgrowth Course was complicated by acute blood loss anemia for which 1 unit of packed cells was administered 10/17. Also the patient received 2 units of packed cells on 10/24. Lower GI bleed was questioned but bleeding scan was negative. The bleeding resolved on its own & GI felt no other workup was needed at this time. The patient did exhibit encephalopathy possibly related to remote alcohol abuse. Thiamine and folic acid supplements were ordered. Hypernatremia was corrected with free water replacement. Elevated troponin was felt to be due to demand ischemia.. He had acute kidney injury superimposed on CKD stage III with a baseline creatinine of 1.4-1.8. Creatinine did normalize. Demadex 20 mg was held. At discharge glucose 119, creatinine 1.38, calcium 7.2, creatinine clearance 48, and 9.6/hematocrit 29.4 with normochromic, normocytic indices.  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 His diabetes is felt to be well controlled. External beam  prostate radiation for recurrent cancer.Prior  prostate cryoablation.L knee surgery X 3; R X1.  Social history: Quit drinking 2013. Former IV drug use stopping in the 1980s. Non smoker since 77  Family history: Reviewed  Review of systems: History was unreliable due to MNCD with 23 on SLUMS testing. He was able to give me the year and name the president. The month was given as "Fall". Most answers were "I can't remember" including the name of his surgeons and the location of the hospital where the surgery was done.[facility]  He describes intermittent discomfort at the ostomy bag. He also describes "sore butt" due to prolonged sitting. He states he has had edema of the right lower extremity after prolonged standing. Otherwise he denies active symptoms.  Constitutional: No fever,significant weight change, fatigue  Eyes: No redness, discharge, pain, vision change ENT/mouth: No nasal congestion,  purulent discharge, earache,change in hearing ,sore throat  Cardiovascular: No chest pain, palpitations,paroxysmal nocturnal dyspnea, claudication Respiratory: No cough, sputum production,hemoptysis, DOE , significant snoring,apnea  Gastrointestinal: No heartburn,dysphagia, nausea / vomiting,rectal bleeding, change in bowels Genitourinary: No dysuria,hematuria, pyuria,  incontinence, nocturia Musculoskeletal: No joint stiffness, joint swelling, weakness,pain Dermatologic: No rash, pruritus, change in appearance of skin Neurologic: No dizziness,headache,syncope, seizures, numbness , tingling Psychiatric: No significant anxiety , depression, insomnia, anorexia Endocrine: No change in hair/skin/ nails, excessive thirst, excessive hunger, excessive urination  Hematologic/lymphatic: No significant bruising, lymphadenopathy,abnormal bleeding Allergy/immunology: No itchy/ watery eyes, significant sneezing, urticaria, angioedema  Physical exam:  Pertinent or positive findings: Somewhat chronically ill appearing.Pattern alopecia is present. The maxilla is  edentulous. He has a few lower teeth. He is barrel chested with decreased breath sounds. Heart sounds are distant. Ostomy LLQ. Healing lower abdominal scars w/o erythema or purulent. Posterior tibial pulses are decreased. He has trace edema of the right lower extremity. He has hyperpigmentation and scaling of the shins. Tattoos are present on the right upper extremity. He has scattered bruising mainly on the right upper extremity. Fusiform changes of the knees are present.  General appearance: no acute distress , increased work of breathing is present.   Lymphatic: No lymphadenopathy about the head, neck, axilla . Eyes: No conjunctival inflammation or lid edema is present. There is no scleral icterus. Ears:  External ear exam shows no significant lesions or deformities.   Nose:  External nasal examination shows no deformity or inflammation. Nasal mucosa are pink and moist without lesions ,exudates Oral exam: lips and gums are healthy appearing.There is no oropharyngeal erythema or exudate . Neck:  No thyromegaly, masses, tenderness noted.    Heart:  Normal rate and regular rhythm. S1 and S2 normal without gallop, murmur, click, rub .  Lungs: without wheezes, rhonchi,rales , rubs. Abdomen:Bowel sounds are normal. Abdomen is soft and nontender with no organomegaly, hernias,masses. GU: deferred  Extremities:  No cyanosis, clubbing  Neurologic exam : Balance,Rhomberg,finger to nose testing could not be completed due to clinical state Skin: Warm & dry w/o tenting. No significant rash.  See clinical summary under each active problem in the Problem List with associated updated therapeutic plan

## 2017-05-07 ENCOUNTER — Encounter: Payer: Self-pay | Admitting: Internal Medicine

## 2017-05-07 DIAGNOSIS — R296 Repeated falls: Secondary | ICD-10-CM | POA: Insufficient documentation

## 2017-05-07 DIAGNOSIS — Z789 Other specified health status: Secondary | ICD-10-CM | POA: Insufficient documentation

## 2017-05-07 NOTE — Assessment & Plan Note (Signed)
05/06/17 Patient unable to give hx of any change in heart rhythm or rate ,chest pain or shortness of breath prior to the falls  Also unable to provide hx of headache, limb weakness, tingling,numbness, or seizure activity as prodrome prior to the episode OP neurocardiac evaluation indicated if falls recur post discharge

## 2017-05-07 NOTE — Patient Instructions (Addendum)
See assessment and plan under each diagnosis in the problem list and acutely for this visit ?Total time 53 minutes; greater than 50% of the visit spent counseling patient and coordinating care for problems addressed at this encounter ? ?

## 2017-05-07 NOTE — Assessment & Plan Note (Signed)
Neurocognitive reassessment post discharge

## 2017-05-09 LAB — BASIC METABOLIC PANEL
BUN: 14 (ref 4–21)
Creatinine: 1.6 — AB (ref 0.6–1.3)
GLUCOSE: 80
POTASSIUM: 5.5 — AB (ref 3.4–5.3)
Sodium: 137 (ref 137–147)

## 2017-05-09 LAB — CBC AND DIFFERENTIAL
HEMATOCRIT: 27 — AB (ref 41–53)
Hemoglobin: 8.7 — AB (ref 13.5–17.5)
Neutrophils Absolute: 5
PLATELETS: 321 (ref 150–399)
WBC: 8.1

## 2017-05-20 ENCOUNTER — Other Ambulatory Visit: Payer: Self-pay | Admitting: *Deleted

## 2017-05-20 NOTE — Patient Outreach (Signed)
[facility]) Care Management  05/20/2017  Colin [name] 1940/12/27 [id number]   Cameron Ali, SW at facility reports patient to discharge home 11/15.   Met with patient at bedside of facility.  He reports his ex-wife is moving to [place] to assist him. He also reports he has a son and dil in [place] who help him at home.   Patient states he is independent with transportation and medication management.  He is not sure if he will even need home care upon discharge.   RNCM reviewed [facility] care management program.  Patient declines program at this time, but did accept a [facility] packet and RNCM contact.   Plan to sign off. Royetta Crochet. Laymond Purser, RN, BSN, [place] [phone]) Business Cell  [phone]) Toll Free Office

## 2017-05-23 DIAGNOSIS — D631 Anemia in chronic kidney disease: Secondary | ICD-10-CM | POA: Diagnosis not present

## 2017-05-23 DIAGNOSIS — R278 Other lack of coordination: Secondary | ICD-10-CM | POA: Diagnosis not present

## 2017-05-23 DIAGNOSIS — N360 Urethral fistula: Secondary | ICD-10-CM | POA: Diagnosis not present

## 2017-05-23 DIAGNOSIS — M6281 Muscle weakness (generalized): Secondary | ICD-10-CM | POA: Diagnosis not present

## 2017-05-23 DIAGNOSIS — N183 Chronic kidney disease, stage 3 (moderate): Secondary | ICD-10-CM | POA: Diagnosis not present

## 2017-05-23 DIAGNOSIS — M170 Bilateral primary osteoarthritis of knee: Secondary | ICD-10-CM | POA: Diagnosis not present

## 2017-05-23 DIAGNOSIS — Z792 Long term (current) use of antibiotics: Secondary | ICD-10-CM | POA: Diagnosis not present

## 2017-05-23 DIAGNOSIS — I129 Hypertensive chronic kidney disease with stage 1 through stage 4 chronic kidney disease, or unspecified chronic kidney disease: Secondary | ICD-10-CM | POA: Diagnosis not present

## 2017-05-23 DIAGNOSIS — R262 Difficulty in walking, not elsewhere classified: Secondary | ICD-10-CM | POA: Diagnosis not present

## 2017-05-23 DIAGNOSIS — E1122 Type 2 diabetes mellitus with diabetic chronic kidney disease: Secondary | ICD-10-CM | POA: Diagnosis not present

## 2017-05-23 DIAGNOSIS — Z48815 Encounter for surgical aftercare following surgery on the digestive system: Secondary | ICD-10-CM | POA: Diagnosis not present

## 2017-05-23 DIAGNOSIS — I10 Essential (primary) hypertension: Secondary | ICD-10-CM | POA: Diagnosis not present

## 2017-05-26 DIAGNOSIS — Z792 Long term (current) use of antibiotics: Secondary | ICD-10-CM | POA: Diagnosis not present

## 2017-05-26 DIAGNOSIS — M170 Bilateral primary osteoarthritis of knee: Secondary | ICD-10-CM | POA: Diagnosis not present

## 2017-05-26 DIAGNOSIS — A419 Sepsis, unspecified organism: Secondary | ICD-10-CM | POA: Diagnosis not present

## 2017-05-26 DIAGNOSIS — Z48815 Encounter for surgical aftercare following surgery on the digestive system: Secondary | ICD-10-CM | POA: Diagnosis not present

## 2017-05-26 DIAGNOSIS — M6281 Muscle weakness (generalized): Secondary | ICD-10-CM | POA: Diagnosis not present

## 2017-05-26 DIAGNOSIS — R278 Other lack of coordination: Secondary | ICD-10-CM | POA: Diagnosis not present

## 2017-05-26 DIAGNOSIS — I10 Essential (primary) hypertension: Secondary | ICD-10-CM | POA: Diagnosis not present

## 2017-05-26 DIAGNOSIS — Z23 Encounter for immunization: Secondary | ICD-10-CM | POA: Diagnosis not present

## 2017-05-26 DIAGNOSIS — R262 Difficulty in walking, not elsewhere classified: Secondary | ICD-10-CM | POA: Diagnosis not present

## 2017-05-26 DIAGNOSIS — N360 Urethral fistula: Secondary | ICD-10-CM | POA: Diagnosis not present

## 2017-05-26 DIAGNOSIS — Z6824 Body mass index (BMI) 24.0-24.9, adult: Secondary | ICD-10-CM | POA: Diagnosis not present

## 2017-05-26 DIAGNOSIS — E870 Hyperosmolality and hypernatremia: Secondary | ICD-10-CM | POA: Diagnosis not present

## 2017-05-26 DIAGNOSIS — N179 Acute kidney failure, unspecified: Secondary | ICD-10-CM | POA: Diagnosis not present

## 2017-05-26 DIAGNOSIS — K922 Gastrointestinal hemorrhage, unspecified: Secondary | ICD-10-CM | POA: Diagnosis not present

## 2017-05-27 DIAGNOSIS — I10 Essential (primary) hypertension: Secondary | ICD-10-CM | POA: Diagnosis not present

## 2017-05-27 DIAGNOSIS — M170 Bilateral primary osteoarthritis of knee: Secondary | ICD-10-CM | POA: Diagnosis not present

## 2017-05-27 DIAGNOSIS — R262 Difficulty in walking, not elsewhere classified: Secondary | ICD-10-CM | POA: Diagnosis not present

## 2017-05-27 DIAGNOSIS — N360 Urethral fistula: Secondary | ICD-10-CM | POA: Diagnosis not present

## 2017-05-27 DIAGNOSIS — Z792 Long term (current) use of antibiotics: Secondary | ICD-10-CM | POA: Diagnosis not present

## 2017-05-27 DIAGNOSIS — R278 Other lack of coordination: Secondary | ICD-10-CM | POA: Diagnosis not present

## 2017-05-27 DIAGNOSIS — M6281 Muscle weakness (generalized): Secondary | ICD-10-CM | POA: Diagnosis not present

## 2017-05-27 DIAGNOSIS — Z48815 Encounter for surgical aftercare following surgery on the digestive system: Secondary | ICD-10-CM | POA: Diagnosis not present

## 2017-05-28 ENCOUNTER — Ambulatory Visit (INDEPENDENT_AMBULATORY_CARE_PROVIDER_SITE_OTHER): Payer: Medicare Other | Admitting: Urology

## 2017-05-28 DIAGNOSIS — I10 Essential (primary) hypertension: Secondary | ICD-10-CM | POA: Diagnosis not present

## 2017-05-28 DIAGNOSIS — N360 Urethral fistula: Secondary | ICD-10-CM | POA: Diagnosis not present

## 2017-05-28 DIAGNOSIS — M170 Bilateral primary osteoarthritis of knee: Secondary | ICD-10-CM | POA: Diagnosis not present

## 2017-05-28 DIAGNOSIS — R278 Other lack of coordination: Secondary | ICD-10-CM | POA: Diagnosis not present

## 2017-05-28 DIAGNOSIS — Z48815 Encounter for surgical aftercare following surgery on the digestive system: Secondary | ICD-10-CM | POA: Diagnosis not present

## 2017-05-28 DIAGNOSIS — M6281 Muscle weakness (generalized): Secondary | ICD-10-CM | POA: Diagnosis not present

## 2017-05-28 DIAGNOSIS — Z792 Long term (current) use of antibiotics: Secondary | ICD-10-CM | POA: Diagnosis not present

## 2017-05-28 DIAGNOSIS — R262 Difficulty in walking, not elsewhere classified: Secondary | ICD-10-CM | POA: Diagnosis not present

## 2017-06-02 DIAGNOSIS — R278 Other lack of coordination: Secondary | ICD-10-CM | POA: Diagnosis not present

## 2017-06-02 DIAGNOSIS — N360 Urethral fistula: Secondary | ICD-10-CM | POA: Diagnosis not present

## 2017-06-02 DIAGNOSIS — M6281 Muscle weakness (generalized): Secondary | ICD-10-CM | POA: Diagnosis not present

## 2017-06-02 DIAGNOSIS — M170 Bilateral primary osteoarthritis of knee: Secondary | ICD-10-CM | POA: Diagnosis not present

## 2017-06-02 DIAGNOSIS — I10 Essential (primary) hypertension: Secondary | ICD-10-CM | POA: Diagnosis not present

## 2017-06-02 DIAGNOSIS — Z792 Long term (current) use of antibiotics: Secondary | ICD-10-CM | POA: Diagnosis not present

## 2017-06-02 DIAGNOSIS — R262 Difficulty in walking, not elsewhere classified: Secondary | ICD-10-CM | POA: Diagnosis not present

## 2017-06-02 DIAGNOSIS — Z48815 Encounter for surgical aftercare following surgery on the digestive system: Secondary | ICD-10-CM | POA: Diagnosis not present

## 2017-06-03 DIAGNOSIS — M6281 Muscle weakness (generalized): Secondary | ICD-10-CM | POA: Diagnosis not present

## 2017-06-03 DIAGNOSIS — M170 Bilateral primary osteoarthritis of knee: Secondary | ICD-10-CM | POA: Diagnosis not present

## 2017-06-03 DIAGNOSIS — Z48815 Encounter for surgical aftercare following surgery on the digestive system: Secondary | ICD-10-CM | POA: Diagnosis not present

## 2017-06-03 DIAGNOSIS — N360 Urethral fistula: Secondary | ICD-10-CM | POA: Diagnosis not present

## 2017-06-03 DIAGNOSIS — R278 Other lack of coordination: Secondary | ICD-10-CM | POA: Diagnosis not present

## 2017-06-03 DIAGNOSIS — R262 Difficulty in walking, not elsewhere classified: Secondary | ICD-10-CM | POA: Diagnosis not present

## 2017-06-03 DIAGNOSIS — I10 Essential (primary) hypertension: Secondary | ICD-10-CM | POA: Diagnosis not present

## 2017-06-03 DIAGNOSIS — Z792 Long term (current) use of antibiotics: Secondary | ICD-10-CM | POA: Diagnosis not present

## 2017-06-04 DIAGNOSIS — Z48815 Encounter for surgical aftercare following surgery on the digestive system: Secondary | ICD-10-CM | POA: Diagnosis not present

## 2017-06-04 DIAGNOSIS — I10 Essential (primary) hypertension: Secondary | ICD-10-CM | POA: Diagnosis not present

## 2017-06-04 DIAGNOSIS — Z792 Long term (current) use of antibiotics: Secondary | ICD-10-CM | POA: Diagnosis not present

## 2017-06-04 DIAGNOSIS — R262 Difficulty in walking, not elsewhere classified: Secondary | ICD-10-CM | POA: Diagnosis not present

## 2017-06-04 DIAGNOSIS — M170 Bilateral primary osteoarthritis of knee: Secondary | ICD-10-CM | POA: Diagnosis not present

## 2017-06-04 DIAGNOSIS — R278 Other lack of coordination: Secondary | ICD-10-CM | POA: Diagnosis not present

## 2017-06-04 DIAGNOSIS — M6281 Muscle weakness (generalized): Secondary | ICD-10-CM | POA: Diagnosis not present

## 2017-06-04 DIAGNOSIS — N360 Urethral fistula: Secondary | ICD-10-CM | POA: Diagnosis not present

## 2017-06-05 DIAGNOSIS — I10 Essential (primary) hypertension: Secondary | ICD-10-CM | POA: Diagnosis not present

## 2017-06-05 DIAGNOSIS — R262 Difficulty in walking, not elsewhere classified: Secondary | ICD-10-CM | POA: Diagnosis not present

## 2017-06-05 DIAGNOSIS — Z48815 Encounter for surgical aftercare following surgery on the digestive system: Secondary | ICD-10-CM | POA: Diagnosis not present

## 2017-06-05 DIAGNOSIS — Z792 Long term (current) use of antibiotics: Secondary | ICD-10-CM | POA: Diagnosis not present

## 2017-06-05 DIAGNOSIS — R278 Other lack of coordination: Secondary | ICD-10-CM | POA: Diagnosis not present

## 2017-06-05 DIAGNOSIS — M6281 Muscle weakness (generalized): Secondary | ICD-10-CM | POA: Diagnosis not present

## 2017-06-05 DIAGNOSIS — M170 Bilateral primary osteoarthritis of knee: Secondary | ICD-10-CM | POA: Diagnosis not present

## 2017-06-05 DIAGNOSIS — N360 Urethral fistula: Secondary | ICD-10-CM | POA: Diagnosis not present

## 2017-06-09 DIAGNOSIS — D631 Anemia in chronic kidney disease: Secondary | ICD-10-CM | POA: Diagnosis not present

## 2017-06-09 DIAGNOSIS — Z48815 Encounter for surgical aftercare following surgery on the digestive system: Secondary | ICD-10-CM | POA: Diagnosis not present

## 2017-06-09 DIAGNOSIS — E1122 Type 2 diabetes mellitus with diabetic chronic kidney disease: Secondary | ICD-10-CM | POA: Diagnosis not present

## 2017-06-09 DIAGNOSIS — Z9181 History of falling: Secondary | ICD-10-CM | POA: Diagnosis not present

## 2017-06-09 DIAGNOSIS — I129 Hypertensive chronic kidney disease with stage 1 through stage 4 chronic kidney disease, or unspecified chronic kidney disease: Secondary | ICD-10-CM | POA: Diagnosis not present

## 2017-06-09 DIAGNOSIS — Z79891 Long term (current) use of opiate analgesic: Secondary | ICD-10-CM | POA: Diagnosis not present

## 2017-06-09 DIAGNOSIS — M170 Bilateral primary osteoarthritis of knee: Secondary | ICD-10-CM | POA: Diagnosis not present

## 2017-06-09 DIAGNOSIS — Z96653 Presence of artificial knee joint, bilateral: Secondary | ICD-10-CM | POA: Diagnosis not present

## 2017-06-09 DIAGNOSIS — N183 Chronic kidney disease, stage 3 (moderate): Secondary | ICD-10-CM | POA: Diagnosis not present

## 2017-06-09 DIAGNOSIS — Z433 Encounter for attention to colostomy: Secondary | ICD-10-CM | POA: Diagnosis not present

## 2017-06-10 ENCOUNTER — Ambulatory Visit (INDEPENDENT_AMBULATORY_CARE_PROVIDER_SITE_OTHER): Payer: Medicare Other | Admitting: Urology

## 2017-06-10 DIAGNOSIS — N360 Urethral fistula: Secondary | ICD-10-CM | POA: Diagnosis not present

## 2017-06-11 DIAGNOSIS — Z48815 Encounter for surgical aftercare following surgery on the digestive system: Secondary | ICD-10-CM | POA: Diagnosis not present

## 2017-06-11 DIAGNOSIS — Z433 Encounter for attention to colostomy: Secondary | ICD-10-CM | POA: Diagnosis not present

## 2017-06-11 DIAGNOSIS — I872 Venous insufficiency (chronic) (peripheral): Secondary | ICD-10-CM | POA: Diagnosis not present

## 2017-06-11 DIAGNOSIS — Z96653 Presence of artificial knee joint, bilateral: Secondary | ICD-10-CM | POA: Diagnosis not present

## 2017-06-11 DIAGNOSIS — I890 Lymphedema, not elsewhere classified: Secondary | ICD-10-CM | POA: Diagnosis not present

## 2017-06-11 DIAGNOSIS — D631 Anemia in chronic kidney disease: Secondary | ICD-10-CM | POA: Diagnosis not present

## 2017-06-11 DIAGNOSIS — M170 Bilateral primary osteoarthritis of knee: Secondary | ICD-10-CM | POA: Diagnosis not present

## 2017-06-11 DIAGNOSIS — Z1389 Encounter for screening for other disorder: Secondary | ICD-10-CM | POA: Diagnosis not present

## 2017-06-11 DIAGNOSIS — N183 Chronic kidney disease, stage 3 (moderate): Secondary | ICD-10-CM | POA: Diagnosis not present

## 2017-06-11 DIAGNOSIS — E1122 Type 2 diabetes mellitus with diabetic chronic kidney disease: Secondary | ICD-10-CM | POA: Diagnosis not present

## 2017-06-11 DIAGNOSIS — Z9181 History of falling: Secondary | ICD-10-CM | POA: Diagnosis not present

## 2017-06-11 DIAGNOSIS — Z79891 Long term (current) use of opiate analgesic: Secondary | ICD-10-CM | POA: Diagnosis not present

## 2017-06-11 DIAGNOSIS — I129 Hypertensive chronic kidney disease with stage 1 through stage 4 chronic kidney disease, or unspecified chronic kidney disease: Secondary | ICD-10-CM | POA: Diagnosis not present

## 2017-06-12 DIAGNOSIS — Z96653 Presence of artificial knee joint, bilateral: Secondary | ICD-10-CM | POA: Diagnosis not present

## 2017-06-12 DIAGNOSIS — Z433 Encounter for attention to colostomy: Secondary | ICD-10-CM | POA: Diagnosis not present

## 2017-06-12 DIAGNOSIS — M170 Bilateral primary osteoarthritis of knee: Secondary | ICD-10-CM | POA: Diagnosis not present

## 2017-06-12 DIAGNOSIS — I129 Hypertensive chronic kidney disease with stage 1 through stage 4 chronic kidney disease, or unspecified chronic kidney disease: Secondary | ICD-10-CM | POA: Diagnosis not present

## 2017-06-12 DIAGNOSIS — E1122 Type 2 diabetes mellitus with diabetic chronic kidney disease: Secondary | ICD-10-CM | POA: Diagnosis not present

## 2017-06-12 DIAGNOSIS — N183 Chronic kidney disease, stage 3 (moderate): Secondary | ICD-10-CM | POA: Diagnosis not present

## 2017-06-12 DIAGNOSIS — Z9181 History of falling: Secondary | ICD-10-CM | POA: Diagnosis not present

## 2017-06-12 DIAGNOSIS — Z48815 Encounter for surgical aftercare following surgery on the digestive system: Secondary | ICD-10-CM | POA: Diagnosis not present

## 2017-06-12 DIAGNOSIS — D631 Anemia in chronic kidney disease: Secondary | ICD-10-CM | POA: Diagnosis not present

## 2017-06-12 DIAGNOSIS — Z79891 Long term (current) use of opiate analgesic: Secondary | ICD-10-CM | POA: Diagnosis not present

## 2017-06-18 DIAGNOSIS — N183 Chronic kidney disease, stage 3 (moderate): Secondary | ICD-10-CM | POA: Diagnosis not present

## 2017-06-18 DIAGNOSIS — Z9181 History of falling: Secondary | ICD-10-CM | POA: Diagnosis not present

## 2017-06-18 DIAGNOSIS — Z48815 Encounter for surgical aftercare following surgery on the digestive system: Secondary | ICD-10-CM | POA: Diagnosis not present

## 2017-06-18 DIAGNOSIS — Z96653 Presence of artificial knee joint, bilateral: Secondary | ICD-10-CM | POA: Diagnosis not present

## 2017-06-18 DIAGNOSIS — I129 Hypertensive chronic kidney disease with stage 1 through stage 4 chronic kidney disease, or unspecified chronic kidney disease: Secondary | ICD-10-CM | POA: Diagnosis not present

## 2017-06-18 DIAGNOSIS — M170 Bilateral primary osteoarthritis of knee: Secondary | ICD-10-CM | POA: Diagnosis not present

## 2017-06-18 DIAGNOSIS — Z79891 Long term (current) use of opiate analgesic: Secondary | ICD-10-CM | POA: Diagnosis not present

## 2017-06-18 DIAGNOSIS — E1122 Type 2 diabetes mellitus with diabetic chronic kidney disease: Secondary | ICD-10-CM | POA: Diagnosis not present

## 2017-06-18 DIAGNOSIS — D631 Anemia in chronic kidney disease: Secondary | ICD-10-CM | POA: Diagnosis not present

## 2017-06-18 DIAGNOSIS — Z433 Encounter for attention to colostomy: Secondary | ICD-10-CM | POA: Diagnosis not present

## 2017-06-20 DIAGNOSIS — Z96653 Presence of artificial knee joint, bilateral: Secondary | ICD-10-CM | POA: Diagnosis not present

## 2017-06-20 DIAGNOSIS — Z9181 History of falling: Secondary | ICD-10-CM | POA: Diagnosis not present

## 2017-06-20 DIAGNOSIS — N183 Chronic kidney disease, stage 3 (moderate): Secondary | ICD-10-CM | POA: Diagnosis not present

## 2017-06-20 DIAGNOSIS — Z79891 Long term (current) use of opiate analgesic: Secondary | ICD-10-CM | POA: Diagnosis not present

## 2017-06-20 DIAGNOSIS — M170 Bilateral primary osteoarthritis of knee: Secondary | ICD-10-CM | POA: Diagnosis not present

## 2017-06-20 DIAGNOSIS — Z433 Encounter for attention to colostomy: Secondary | ICD-10-CM | POA: Diagnosis not present

## 2017-06-20 DIAGNOSIS — I129 Hypertensive chronic kidney disease with stage 1 through stage 4 chronic kidney disease, or unspecified chronic kidney disease: Secondary | ICD-10-CM | POA: Diagnosis not present

## 2017-06-20 DIAGNOSIS — E1122 Type 2 diabetes mellitus with diabetic chronic kidney disease: Secondary | ICD-10-CM | POA: Diagnosis not present

## 2017-06-20 DIAGNOSIS — D631 Anemia in chronic kidney disease: Secondary | ICD-10-CM | POA: Diagnosis not present

## 2017-06-20 DIAGNOSIS — Z48815 Encounter for surgical aftercare following surgery on the digestive system: Secondary | ICD-10-CM | POA: Diagnosis not present

## 2017-06-24 ENCOUNTER — Ambulatory Visit: Payer: Medicare Other | Admitting: Urology

## 2017-06-24 DIAGNOSIS — N35012 Post-traumatic membranous urethral stricture: Secondary | ICD-10-CM

## 2017-06-24 DIAGNOSIS — C61 Malignant neoplasm of prostate: Secondary | ICD-10-CM

## 2017-06-24 DIAGNOSIS — R600 Localized edema: Secondary | ICD-10-CM | POA: Diagnosis not present

## 2017-06-24 DIAGNOSIS — N360 Urethral fistula: Secondary | ICD-10-CM | POA: Diagnosis not present

## 2017-06-24 DIAGNOSIS — L97801 Non-pressure chronic ulcer of other part of unspecified lower leg limited to breakdown of skin: Secondary | ICD-10-CM | POA: Diagnosis not present

## 2017-06-24 DIAGNOSIS — I872 Venous insufficiency (chronic) (peripheral): Secondary | ICD-10-CM | POA: Diagnosis not present

## 2017-06-26 ENCOUNTER — Telehealth (HOSPITAL_COMMUNITY): Payer: Self-pay | Admitting: Physical Therapy

## 2017-06-26 DIAGNOSIS — Z79891 Long term (current) use of opiate analgesic: Secondary | ICD-10-CM | POA: Diagnosis not present

## 2017-06-26 DIAGNOSIS — E1122 Type 2 diabetes mellitus with diabetic chronic kidney disease: Secondary | ICD-10-CM | POA: Diagnosis not present

## 2017-06-26 DIAGNOSIS — Z96653 Presence of artificial knee joint, bilateral: Secondary | ICD-10-CM | POA: Diagnosis not present

## 2017-06-26 DIAGNOSIS — D631 Anemia in chronic kidney disease: Secondary | ICD-10-CM | POA: Diagnosis not present

## 2017-06-26 DIAGNOSIS — N183 Chronic kidney disease, stage 3 (moderate): Secondary | ICD-10-CM | POA: Diagnosis not present

## 2017-06-26 DIAGNOSIS — Z9181 History of falling: Secondary | ICD-10-CM | POA: Diagnosis not present

## 2017-06-26 DIAGNOSIS — I129 Hypertensive chronic kidney disease with stage 1 through stage 4 chronic kidney disease, or unspecified chronic kidney disease: Secondary | ICD-10-CM | POA: Diagnosis not present

## 2017-06-26 DIAGNOSIS — Z48815 Encounter for surgical aftercare following surgery on the digestive system: Secondary | ICD-10-CM | POA: Diagnosis not present

## 2017-06-26 DIAGNOSIS — M170 Bilateral primary osteoarthritis of knee: Secondary | ICD-10-CM | POA: Diagnosis not present

## 2017-06-26 DIAGNOSIS — Z433 Encounter for attention to colostomy: Secondary | ICD-10-CM | POA: Diagnosis not present

## 2017-06-26 NOTE — Telephone Encounter (Signed)
Patient called to set up his appt for wound care and was offer a 11:30 appt for today. He stated he have to much to do and company coming from out of town and wanted to wait until after Christmas and call us back.

## 2017-07-02 ENCOUNTER — Telehealth (HOSPITAL_COMMUNITY): Payer: Self-pay | Admitting: Internal Medicine

## 2017-07-02 NOTE — Telephone Encounter (Signed)
07/02/17  I spoke to patient and let him know that he should come in at 9am instead of 9:45 and he said ok.

## 2017-07-03 ENCOUNTER — Telehealth (HOSPITAL_COMMUNITY): Payer: Self-pay | Admitting: Physical Therapy

## 2017-07-03 ENCOUNTER — Encounter (HOSPITAL_COMMUNITY): Payer: Self-pay | Admitting: Physical Therapy

## 2017-07-03 ENCOUNTER — Ambulatory Visit (HOSPITAL_COMMUNITY): Payer: Medicare Other | Attending: Family Medicine | Admitting: Physical Therapy

## 2017-07-03 DIAGNOSIS — I890 Lymphedema, not elsewhere classified: Secondary | ICD-10-CM | POA: Insufficient documentation

## 2017-07-03 DIAGNOSIS — X58XXXS Exposure to other specified factors, sequela: Secondary | ICD-10-CM | POA: Diagnosis not present

## 2017-07-03 DIAGNOSIS — S81801S Unspecified open wound, right lower leg, sequela: Secondary | ICD-10-CM | POA: Diagnosis not present

## 2017-07-03 NOTE — Therapy (Signed)
[place], [place], [address] Phone: [phone]   Fax:  [phone]  Wound Care Evaluation  Patient Details  Name: Colin [name] MRN: [id number] Date of Birth: November 03, 1940 Referring Provider: Sharilyn [name]    Encounter Date: 07/03/2017  PT End of Session - 07/03/17 1013    Visit Number  1    Number of Visits  6    Date for PT Re-Evaluation  08/02/17    Authorization Type  [employer]HC medicare    Authorization - Visit Number  1    Authorization - Number of Visits  6    PT Start Time  0900    PT Stop Time  0950    PT Time Calculation (min)  50 min    Activity Tolerance  Patient tolerated treatment well    Behavior During Therapy  Colin [name] for tasks assessed/performed       Past Medical History:  Diagnosis Date  . Arthritis   . Bilateral lower extremity edema   . Chronic venous insufficiency   . CKD (chronic kidney disease), stage III ([place])   . History of alcohol abuse    none since 2013 per pt  . History of chronic hepatitis    Genotype 1, FO/F1-- treated w/ harvoni (completed May 2016)  . History of external beam radiation therapy    2007 prostate  . History of intravenous drug use in remission    per pt in remission since 1980's  . HOH (hard of hearing)   . Hypertension   . Normocytic anemia   . Recurrent prostate cancer [facility]) urologist-  dr Colin [name]   dx 2007  s/p  external beam radioation therapy/ recurrent 12-2015  Gleason 4+4  . Type 2 diabetes, diet controlled ([place])    no meds since 2010 approx    Past Surgical History:  Procedure Laterality Date  . CATARACT EXTRACTION W/PHACO Left 12/14/2012   Procedure: CATARACT EXTRACTION PHACO AND INTRAOCULAR LENS PLACEMENT (IOC);  Surgeon: Colin Branch, MD;  Location: [facility];  Service: Ophthalmology;  Laterality: Left;  CDE: 13.74  . CATARACT EXTRACTION W/PHACO Right 12/28/2012   Procedure: CATARACT EXTRACTION PHACO AND INTRAOCULAR LENS PLACEMENT (IOC);  Surgeon: Colin Branch, MD;  Location: [facility];  Service: Ophthalmology;  Laterality: Right;  CDE:12.80  . COLONOSCOPY N/A 02/14/2014   ONG:[id number] colonic polyps-removed as described above Colonic diverticulosis (tubular adenoma)  . CRYOABLATION N/A 05/24/2016   Procedure: CRYO ABLATION PROSTATE;  Surgeon: Colin Aloe, MD;  Location: [facility];  Service: Urology;  Laterality: N/A;  . ENDOVENOUS ABLATION SAPHENOUS VEIN W/ LASER  2013   Right leg X 2 by Dr. Debara [name]  . ESOPHAGOGASTRODUODENOSCOPY N/A 02/14/2014   KGM:WNUUVO large pedunculated gastric polyp (hyperplastic)  . INCISION AND DRAINAGE OF PERITONSILLAR ABCESS  04/24/2017   Procedure: INCISION AND DRAINAGE OF PERINEUM ABCESS;  Surgeon: Colin Gallo, MD;  Location: [place];  Service: Urology;;  . INSERTION OF SUPRAPUBIC CATHETER N/A 04/24/2017   Procedure: OPEN INSERTION OF SUPRAPUBIC CATHETER;  Surgeon: Colin Gallo, MD;  Location: [place];  Service: Urology;  Laterality: N/A;  . LAPAROSCOPIC DIVERTED COLOSTOMY N/A 04/24/2017   Procedure: LAPAROSCOPIC SIGMOID COLOSTOMY;  Surgeon: Colin Roup, MD;  Location: [place];  Service: General;  Laterality: N/A;  . REIMPLANTATION OF TOTAL KNEE Left 03/16/2010  . TONSILLECTOMY  child  . TOTAL KNEE  PROSTHESIS REMOVAL W/ SPACER INSERTION Left 12/29/2009  . TOTAL KNEE ARTHROPLASTY Bilateral left 2007/  right 2005 approx    There were no vitals filed for this visit.  Subjective Assessment - 07/03/17 0857    Subjective  [name] states that he has had a wound on his Right leg for quite a while.  He is is unsure how long he has had it but states that he has been doing everythng that the MD has asked him but it still has not healed.  He knows that he has had the wound since Thanksgiving but other than this he is a poor historian.  He has not worn compression stockings on his leg but has noticed that the leg is swollen.      Pertinent History  B TKR, Renal failure, staph, perineal  abcess,      How long can you sit comfortably?  no problems    How long can you stand comfortably?  Pt does not know     How long can you walk comfortably?  Pt uses a cane and can walk as long as he wants to     Patient Stated Goals  wound to heal     Currently in Pain?  No/denies       [facility] PT Assessment - 07/03/17 0001      Assessment   Medical Diagnosis  Rt nonhealing wound with lymphedema    Referring Provider  Colin [name]     Onset Date/Surgical Date  05/08/17    Next MD Visit  not scheduled    Prior Therapy  none      Precautions   Precautions  None      Restrictions   Weight Bearing Restrictions  No      Balance Screen   Has the patient fallen in the past 6 months  No    Has the patient had a decrease in activity level because of a fear of falling?   No    Is the patient reluctant to leave their home because of a fear of falling?   No      Home Environment   Living Environment  Private residence      Prior Function   Level of [place]  Independent with community mobility with device      Cognition   Overall Cognitive Status  Within Functional Limits for tasks assessed      Wound Therapy - 07/03/17 0908    Subjective  see above     Patient and Family Stated Goals  wound to heal    Date of Onset  05/08/17    Prior Treatments  self care     Pain Assessment  No/denies pain    Evaluation and Treatment Procedures Explained to Patient/Family  Yes    Evaluation and Treatment Procedures  agreed to    Wound Properties Date First Assessed: 07/03/17 Time First Assessed: 1610 Wound Type: Other (Comment) Location: Leg Location Orientation: Right;Lateral Wound Description (Comments): superior aspect hypergranulated  Present on Admission: Yes   Dressing Type  Gauze (Comment)    Dressing Changed  Changed    Dressing Status  Old drainage    Dressing Change Frequency  PRN    Site / Wound Assessment  Clean;Friable;Pale    % Wound base Red or Granulating  0%    Peri-wound  Assessment  Edema;Other (Comment) small red dots around wound; scratch marks on LE     Wound Length (cm)  0.9 cm    Wound Width (cm)  0.9 cm    Wound Depth (cm)  --  hypergranulation of .3 cm     Wound Surface Area (cm^2)  0.81 cm^2    Drainage Amount  Minimal    Drainage Description  Serous    Treatment  Cleansed;Other (Comment) compression dressing and referral for biopsy    Wound Therapy - Clinical Statement  Mr. Azucena is a 76 yo who is a poor historian.  He states that he has had a non-healing wound on his right leg since before Thanksgiving but he is unable to say when it occured.  He has significant swelling in his Rt LE but is unable to state if this started before or after the wound.  He currently has been cleansing and dressing the wound with silvadene.  He has now been referred top skilled physical therapy for treatment of both his swelling and the wound.  Upon inspection it is noted that the wound is hypergranulated and not painful.  The therapist called the referring MD practice and requested a biopsy of the wound to rule out any form of cancer.      Factors Delaying/Impairing Wound Healing  Multiple medical problems;Polypharmacy    Hydrotherapy Plan  Dressing change;Patient/family education;Other (comment) compression dressing change begin manual technique q biopsy    Wound Therapy - Frequency  -- 1x a week if pt tolerates profore well     Wound Therapy - Current Recommendations  PT;Surgery consult surgical consult for biopsy     Wound Plan  Pt LE to be cleansed, moisturized and multi layer compression bandaging system with manual to begin if biopsy is (-).     Dressing   xeroform, 2x2 and profore compression bandaging.l         LYMPHEDEMA/ONCOLOGY QUESTIONNAIRE - 07/03/17 0914      Right Lower Extremity Lymphedema   At Midpatella/Popliteal Crease  46.7 cm    30 cm Proximal to Floor at Lateral Plantar Foot  39.5 cm    20 cm Proximal to Floor at Lateral Plantar Foot  36.7 1    10   cm Proximal to Floor at Lateral Malleoli  30.8 cm    Circumference of ankle/heel  36.5 cm.    5 cm Proximal to 1st MTP Joint  26.3 cm    Across MTP Joint  25.1 cm      Left Lower Extremity Lymphedema   At Midpatella/Popliteal Crease  41.5 cm    30 cm Proximal to Floor at Lateral Plantar Foot  33.2 cm    20 cm Proximal to Floor at Lateral Plantar Foot  27.5 cm    10 cm Proximal to Floor at Lateral Malleoli  27 cm    Circumference of ankle/heel  35.7 cm.    5 cm Proximal to 1st MTP Joint  25.8 cm    Across MTP Joint  24.3 cm          Objective measurements completed on examination: See above findings.    [facility] Adult PT Treatment/Exercise - 07/03/17 0001      Manual Therapy   Manual Therapy  --    Manual therapy comments  --    Manual Lymphatic Drainage (MLD)  --    Compression Bandaging  --             PT Education - 07/03/17 1011    Education provided  Yes    Education Details  Keep wound and dressing dry.  Remove if it becomes painful    Person(s) Educated  Patient    Methods  Explanation  Comprehension  Verbalized understanding       PT Short Term Goals - July 23, 2017 1016      PT SHORT TERM GOAL #1   Title  Wound to have no hypergranulation     Time  3    Period  Weeks    Status  New    Target Date  07/17/17      PT SHORT TERM GOAL #2   Title  PT to understand the need to wear compression on his LE to decrease edema.    Time  2    Period  Weeks    Status  New      PT SHORT TERM GOAL #3   Title  PT to have had wound biopsied     Time  2    Period  Weeks    Status  New        PT Long Term Goals - 07-23-2017 1022      PT LONG TERM GOAL #1   Title  Wound to be no greater than .3x.3 with no elevation to allow pt to feel comfortable in self care.     Time  4    Period  Weeks    Status  New    Target Date  07/31/17      PT LONG TERM GOAL #2   Title  Wound to have scant drainage to reduce risk of infection     Time  4    Period  Weeks    Status   New      PT LONG TERM GOAL #3   Title  Pt to have obtained and to be able to don and doff compression garment.    Time  4    Period  Weeks    Status  New           Plan - 07/23/17 1014    Clinical Impression Statement  see above    Clinical Presentation  Stable    Clinical Decision Making  Low    Rehab Potential  Good    PT Frequency  1x / week    PT Duration  4 weeks    PT Treatment/Interventions  ADLs/Self Care Home Management;Manual techniques;Manual lymph drainage;Compression bandaging;Other (comment) debridement if needed     PT Next Visit Plan  assess wound site for proper dressing.  Take measurement of LT LE for compression garment and give pt list of places to get garments and prescription to get them.  (Therapist sent prescription to MD)  Continue with cleansing and compression dressing until biopsy returns.  If negative may begin manual decongestive techniques.        Patient will benefit from skilled therapeutic intervention in order to improve the following deficits and impairments:  Other (comment)(nonhealing wound)  Visit Diagnosis: Lymphedema, not elsewhere classified  Leg wound, right, sequela  G-Codes - Jul 23, 2017 1025    Functional Assessment Tool Used (Outpatient Only)  size of wound, how long wound has been present without healing.     Functional Limitation  Other PT primary    Other PT Primary Current Status (C6237)  At least 20 percent but less than 40 percent impaired, limited or restricted    Other PT Primary Goal Status (S2831)  At least 1 percent but less than 20 percent impaired, limited or restricted       Problem List Patient Active Problem List   Diagnosis Date Noted  . Recurrent falls 05/07/2017  . Full code status  05/07/2017  . Subdural hematoma ([place]) 05/06/2017  . Blood in stool   . Diarrhea in adult patient   . Heme positive stool   . Perineal abscess   . Recto-prostatic fistula   . MRSA bacteremia   . Staphylococcus aureus  bacteremia with sepsis ([place])   . Severe sepsis with septic shock ([place])   . Acute renal failure ([place])   . Metabolic acidosis   . Melena   . Altered mental status 04/18/2017  . 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 Normocytic anemia 05/16/2014  . Colon cancer screening 01/14/2014  . Lymphedema 02/25/2013  . Leg edema, right 05/04/2012  . Varicose veins of lower extremities with other complications 46/96/2952  . Hepatitis C 08/28/2011  . HYPERTENSION 01/10/2010  . NEOPLASM, MALIGNANT, PROSTATE, HX OF 01/10/2010  . DIABETES MELLITUS, TYPE II 01/04/2010  . 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    Rayetta Humphrey, PT CLT [phone] 07/03/2017, 10:27 AM  [place] [address] [place], [place], [address] Phone: [phone]   Fax:  [phone]  Name: Colin [name] MRN: [id number] Date of Birth: 01/08/1941

## 2017-07-03 NOTE — Telephone Encounter (Signed)
Azucena Freed, PT requested - Call pt ask him to call Dr. Cornelia Copa office and make an apptment. MD will not sign biopsy order until he sees the patient. Pt agreed to call MD and make apptment. NF 07/03/17

## 2017-07-07 ENCOUNTER — Telehealth (HOSPITAL_COMMUNITY): Payer: Self-pay | Admitting: Physical Therapy

## 2017-07-07 ENCOUNTER — Ambulatory Visit (HOSPITAL_COMMUNITY): Payer: Medicare Other | Admitting: Physical Therapy

## 2017-07-07 DIAGNOSIS — I890 Lymphedema, not elsewhere classified: Secondary | ICD-10-CM

## 2017-07-07 DIAGNOSIS — S81801S Unspecified open wound, right lower leg, sequela: Secondary | ICD-10-CM | POA: Diagnosis not present

## 2017-07-07 NOTE — Therapy (Signed)
[place], [place], [address] Phone: [phone]   Fax:  [phone]  Wound Care Therapy  Patient Details  Name: Colin [name] MRN: [id number] Date of Birth: September 25, 1940 Referring Provider: Sharilyn Sites    Encounter Date: 07/07/2017  PT End of Session - 07/07/17 1025    Visit Number  2    Number of Visits  6    Date for PT Re-Evaluation  08/02/17    Authorization Type  [employer] medicare    Authorization - Visit Number  2    Authorization - Number of Visits  6    PT Start Time  0910    PT Stop Time  0940    PT Time Calculation (min)  30 min    Activity Tolerance  Patient tolerated treatment well    Behavior During Therapy  [facility] for tasks assessed/performed       Past Medical History:  Diagnosis Date  . Arthritis   . Bilateral lower extremity edema   . Chronic venous insufficiency   . CKD (chronic kidney disease), stage III ([place])   . History of alcohol abuse    none since 2013 per pt  . History of chronic hepatitis    Genotype 1, FO/F1-- treated w/ harvoni (completed May 2016)  . History of external beam radiation therapy    2007 prostate  . History of intravenous drug use in remission    per pt in remission since 1980's  . HOH (hard of hearing)   . Hypertension   . Normocytic anemia   . Recurrent prostate cancer [facility]) urologist-  dr Junious Silk   dx 2007  s/p  external beam radioation therapy/ recurrent 12-2015  Gleason 4+4  . Type 2 diabetes, diet controlled ([place])    no meds since 2010 approx    Past Surgical History:  Procedure Laterality Date  . CATARACT EXTRACTION W/PHACO Left 12/14/2012   Procedure: CATARACT EXTRACTION PHACO AND INTRAOCULAR LENS PLACEMENT (IOC);  Surgeon: Tonny Branch, MD;  Location: [facility];  Service: Ophthalmology;  Laterality: Left;  CDE: 13.74  . CATARACT EXTRACTION W/PHACO Right 12/28/2012   Procedure: CATARACT EXTRACTION PHACO AND INTRAOCULAR LENS PLACEMENT (IOC);  Surgeon: Tonny Branch, MD;  Location: [facility];  Service: Ophthalmology;  Laterality: Right;  CDE:12.80  . COLONOSCOPY N/A 02/14/2014   [id number] colonic polyps-removed as described above Colonic diverticulosis (tubular adenoma)  . CRYOABLATION N/A 05/24/2016   Procedure: CRYO ABLATION PROSTATE;  Surgeon: Festus Aloe, MD;  Location[facility];  Service: Urology;  Laterality: N/A;  . ENDOVENOUS ABLATION SAPHENOUS VEIN W/ LASER  2013   Right leg X 2 by Dr. Debara Pickett  . ESOPHAGOGASTRODUODENOSCOPY N/A 02/14/2014   [id number] large pedunculated gastric polyp (hyperplastic)  . INCISION AND DRAINAGE OF PERITONSILLAR ABCESS  04/24/2017   Procedure: INCISION AND DRAINAGE OF PERINEUM ABCESS;  Surgeon: Franchot Gallo, MD;  Location: [place];  Service: Urology;;  . INSERTION OF SUPRAPUBIC CATHETER N/A 04/24/2017   Procedure: OPEN INSERTION OF SUPRAPUBIC CATHETER;  Surgeon: Franchot Gallo, MD;  Location: [place];  Service: Urology;  Laterality: N/A;  . LAPAROSCOPIC DIVERTED COLOSTOMY N/A 04/24/2017   Procedure: LAPAROSCOPIC SIGMOID COLOSTOMY;  Surgeon: Ileana Roup, MD;  Location: [place];  Service: General;  Laterality: N/A;  . REIMPLANTATION OF TOTAL KNEE Left 03/16/2010  . TONSILLECTOMY  child  . TOTAL KNEE  PROSTHESIS REMOVAL W/ SPACER INSERTION Left 12/29/2009  . TOTAL KNEE ARTHROPLASTY Bilateral left 2007/  right 2005 approx    There were no vitals filed for this visit.              Wound Therapy - 07/07/17 1013    Subjective  Pt states that he continues to have significant drainage from his wound.  States that the dressing was comfortable.     Patient and Family Stated Goals  wound to heal    Date of Onset  05/08/17    Prior Treatments  self care     Pain Assessment  No/denies pain    Evaluation and Treatment Procedures Explained to Patient/Family  Yes    Evaluation and Treatment Procedures  agreed to    Wound Properties Date First Assessed: 07/03/17 Time First  Assessed: 0737 Wound Type: Other (Comment) Location: Leg Location Orientation: Right;Lateral Wound Description (Comments): superior aspect hypergranulated  Present on Admission: Yes   Dressing Type  Compression wrap    Dressing Changed  Changed    Dressing Status  Old drainage    Dressing Change Frequency  PRN    Site / Wound Assessment  Clean;Friable;Pale    % Wound base Red or Granulating  100%    Peri-wound Assessment  Edema;Erythema (blanchable);Other (Comment) small red dots around wound; scratch marks on LE     Wound Depth (cm)  -- hypergranulation has decreased to .2 cm     Drainage Amount  Moderate    Drainage Description  Serous    Treatment  Cleansed;Other (Comment)    Wound Therapy - Clinical Statement  Pt swelling has decreased significantly in the lower leg but continues to have the rash on his LE. Pt also has   increased in the knee and lower thigh with noted redness in this area.  Wound has increased in drainage but decreased in the hypergranulation.  MD called about possibility of need for antibiotic due to redness and swelling in the knee area.  Pt LE measured for compression garment.  Therapist gave pt the prescription for his garment, the measurements as well as places where he could obtained garments.  Pt verabalized that he would call the outlet center and have them mail him the garments.     Factors Delaying/Impairing Wound Healing  Multiple medical problems;Polypharmacy    Hydrotherapy Plan  Dressing change;Patient/family education;Other (comment) compression dressing change     Wound Therapy - Frequency  -- 1x a week if pt tolerates profore well     Wound Therapy - Current Recommendations  PT;Surgery consult surgical consult for biopsy     Wound Plan  Pt LE to be cleansed, moisturized and multi layer compression bandaging system with manual to begin if biopsy is (-).     Dressing   xeroform to rash area., silverhydrofiber followed by  2x2 medipore tape to wound LE recieved  multilayer compression bandaging using profore lite compression bandaging.l                 PT Short Term Goals - 07/07/17 1026      PT SHORT TERM GOAL #1   Title  Wound to have no hypergranulation     Time  3    Period  Weeks    Status  On-going      PT SHORT TERM GOAL #2   Title  PT to understand the need to wear compression on his LE to decrease edema.    Time  2    Period  Weeks    Status  Achieved  PT SHORT TERM GOAL #3   Title  PT to have had wound biopsied     Time  2    Period  Weeks    Status  On-going        PT Long Term Goals - 07/07/17 1027      PT LONG TERM GOAL #1   Title  Wound to be no greater than .3x.3 with no elevation to allow pt to feel comfortable in self care.     Time  4    Period  Weeks    Status  On-going      PT LONG TERM GOAL #2   Title  Wound to have scant drainage to reduce risk of infection     Time  4    Period  Weeks    Status  On-going      PT LONG TERM GOAL #3   Title  Pt to have obtained and to be able to don and doff compression garment.    Time  4    Period  Weeks    Status  On-going            Plan - 07/07/17 1025    Clinical Impression Statement  see above     Rehab Potential  Good    PT Frequency  1x / week    PT Duration  4 weeks    PT Treatment/Interventions  ADLs/Self Care Home Management;Manual techniques;Manual lymph drainage;Compression bandaging;Other (comment) debridement if needed     PT Next Visit Plan  assess LE to see if Profore lite is enought to keep swelling out.  Marland Kitchen)  Continue with cleansing and compression dressing until biopsy returns.  If negative may begin manual decongestive techniques.        Patient will benefit from skilled therapeutic intervention in order to improve the following deficits and impairments:  Other (comment)(nonhealing wound)  Visit Diagnosis: Lymphedema, not elsewhere classified  Leg wound, right, sequela     Problem List Patient Active Problem List    Diagnosis Date Noted  . Recurrent falls 05/07/2017  . Full code status 05/07/2017  . Subdural hematoma ([place]) 05/06/2017  . Blood in stool   . Diarrhea in adult patient   . Heme positive stool   . Perineal abscess   . Recto-prostatic fistula   . MRSA bacteremia   . Staphylococcus aureus bacteremia with sepsis ([place])   . Severe sepsis with septic shock ([place])   . Acute renal failure ([place])   . Metabolic acidosis   . Melena   . Altered mental status 04/18/2017  . 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 Normocytic anemia 05/16/2014  . Colon cancer screening 01/14/2014  . Lymphedema 02/25/2013  . Leg edema, right 05/04/2012  . Varicose veins of lower extremities with other complications 56/38/7564  . Hepatitis C 08/28/2011  . HYPERTENSION 01/10/2010  . NEOPLASM, MALIGNANT, PROSTATE, HX OF 01/10/2010  . DIABETES MELLITUS, TYPE II 01/04/2010  . 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    Rayetta Humphrey, PT CLT [phone] 07/07/2017, 10:28 AM  [place] [address] [place], [place], [address] Phone: [phone]   Fax:  [phone]  Name: Colin [name] MRN: [id number] Date of Birth: 1940/07/16

## 2017-07-07 NOTE — Telephone Encounter (Signed)
Md office states they wrote a prescription for  Doxycycline on Dec 18th. Patient will need to pick that up or come back to MD office to see the MD. I called the pt and he states he will pick up meds tomorrow. I advised pt to go today due to holiday hours. NF 07/07/2017.

## 2017-07-14 ENCOUNTER — Ambulatory Visit (HOSPITAL_COMMUNITY): Payer: Medicare Other | Attending: Family Medicine | Admitting: Physical Therapy

## 2017-07-14 DIAGNOSIS — S81801S Unspecified open wound, right lower leg, sequela: Secondary | ICD-10-CM | POA: Diagnosis not present

## 2017-07-14 DIAGNOSIS — X58XXXS Exposure to other specified factors, sequela: Secondary | ICD-10-CM | POA: Insufficient documentation

## 2017-07-14 DIAGNOSIS — I890 Lymphedema, not elsewhere classified: Secondary | ICD-10-CM | POA: Insufficient documentation

## 2017-07-14 NOTE — Therapy (Signed)
[place], [place], [address] Phone: [phone]   Fax:  [phone]  Wound Care Therapy  Patient Details  Name: Colin [name] MRN: [id number] Date of Birth: Apr 08, 1941 Referring Provider: Sharilyn Sites    Encounter Date: 07/14/2017  PT End of Session - 07/14/17 1045    Visit Number  3    Number of Visits  6    Date for PT Re-Evaluation  08/02/17    Authorization Type  [employer] medicare    Authorization - Visit Number  3    Authorization - Number of Visits  6    PT Start Time  0905    PT Stop Time  0940    PT Time Calculation (min)  35 min    Activity Tolerance  Patient tolerated treatment well    Behavior During Therapy  [facility] for tasks assessed/performed       Past Medical History:  Diagnosis Date  . Arthritis   . Bilateral lower extremity edema   . Chronic venous insufficiency   . CKD (chronic kidney disease), stage III ([place])   . History of alcohol abuse    none since 2013 per pt  . History of chronic hepatitis    Genotype 1, FO/F1-- treated w/ harvoni (completed May 2016)  . History of external beam radiation therapy    2007 prostate  . History of intravenous drug use in remission    per pt in remission since 1980's  . HOH (hard of hearing)   . Hypertension   . Normocytic anemia   . Recurrent prostate cancer [facility] urologist-  dr Junious Silk   dx 2007  s/p  external beam radioation therapy/ recurrent 12-2015  Gleason 4+4  . Type 2 diabetes, diet controlled ([place])    no meds since 2010 approx    Past Surgical History:  Procedure Laterality Date  . CATARACT EXTRACTION W/PHACO Left 12/14/2012   Procedure: CATARACT EXTRACTION PHACO AND INTRAOCULAR LENS PLACEMENT (IOC);  Surgeon: Tonny Branch, MD;  Location: [facility];  Service: Ophthalmology;  Laterality: Left;  CDE: 13.74  . CATARACT EXTRACTION W/PHACO Right 12/28/2012   Procedure: CATARACT EXTRACTION PHACO AND INTRAOCULAR LENS PLACEMENT (IOC);  Surgeon: Tonny Branch,  MD;  Location: [facility];  Service: Ophthalmology;  Laterality: Right;  CDE:12.80  . COLONOSCOPY N/A 02/14/2014   POE:[id number] colonic polyps-removed as described above Colonic diverticulosis (tubular adenoma)  . CRYOABLATION N/A 05/24/2016   Procedure: CRYO ABLATION PROSTATE;  Surgeon: Festus Aloe, MD;  [facility];  Service: Urology;  Laterality: N/A;  . ENDOVENOUS ABLATION SAPHENOUS VEIN W/ LASER  2013   Right leg X 2 by Dr. Debara Pickett  . ESOPHAGOGASTRODUODENOSCOPY N/A 02/14/2014   ERX:[id number] large pedunculated gastric polyp (hyperplastic)  . INCISION AND DRAINAGE OF PERITONSILLAR ABCESS  04/24/2017   Procedure: INCISION AND DRAINAGE OF PERINEUM ABCESS;  Surgeon: Franchot Gallo, MD;  Location: [place];  Service: Urology;;  . INSERTION OF SUPRAPUBIC CATHETER N/A 04/24/2017   Procedure: OPEN INSERTION OF SUPRAPUBIC CATHETER;  Surgeon: Franchot Gallo, MD;  Location: [place];  Service: Urology;  Laterality: N/A;  . LAPAROSCOPIC DIVERTED COLOSTOMY N/A 04/24/2017   Procedure: LAPAROSCOPIC SIGMOID COLOSTOMY;  Surgeon: Ileana Roup, MD;  Location: [place];  Service: General;  Laterality: N/A;  . REIMPLANTATION OF TOTAL KNEE Left 03/16/2010  . TONSILLECTOMY  child  . TOTAL KNEE  PROSTHESIS REMOVAL W/ SPACER INSERTION Left 12/29/2009  . TOTAL KNEE ARTHROPLASTY Bilateral left 2007/  right 2005 approx    There were no vitals filed for this visit.        LYMPHEDEMA/ONCOLOGY QUESTIONNAIRE - 07/14/17 0949      Right Lower Extremity Lymphedema   At Midpatella/Popliteal Crease  50 cm    30 cm Proximal to Floor at Lateral Plantar Foot  34.5 cm    20 cm Proximal to Floor at Lateral Plantar Foot  28 1    10  cm Proximal to Floor at Lateral Malleoli  25.7 cm    Circumference of ankle/heel  35 cm.    5 cm Proximal to 1st MTP Joint  25.8 cm    Across MTP Joint  21 cm           Wound Therapy - 07/14/17 0942    Subjective  Pt states that the wound still is  draining significantly despite taking the antibiotics.  Pt state that he has an MD visit on the seventh; when therapist states this is today pt was unaware that today was the seventh.      Patient and Family Stated Goals  wound to heal    Date of Onset  05/08/17    Prior Treatments  self care     Evaluation and Treatment Procedures Explained to Patient/Family  Yes    Evaluation and Treatment Procedures  agreed to    Wound Properties Date First Assessed: 07/03/17 Time First Assessed: 0918 Wound Type: Other (Comment) Location: Leg Location Orientation: Right;Lateral Wound Description (Comments): superior aspect hypergranulated  Present on Admission: Yes   Dressing Type  Compression wrap    Dressing Changed  Changed    Dressing Status  Old drainage    Dressing Change Frequency  PRN    Site / Wound Assessment  Clean;Friable;Pale    % Wound base Red or Granulating  100%    Peri-wound Assessment  Edema;Erythema (blanchable);Other (Comment) small red dots around wound; scratch marks on LE     Wound Length (cm)  0.9 cm    Wound Width (cm)  0.9 cm    Wound Depth (cm)  -- hypergranulation increased to .3 cm.     Wound Surface Area (cm^2)  0.81 cm^2    Drainage Amount  Copious    Drainage Description  Serous    Treatment  Cleansed    Wound Therapy - Clinical Statement  Compression wrapping was not used today due to pt having a MD visit.  Therapist still feels that this wound is suspicious and should be biopsied.  LE has small openings throughout the leg that have no drainage.     Factors Delaying/Impairing Wound Healing  Multiple medical problems;Polypharmacy    Hydrotherapy Plan  Dressing change;Patient/family education;Other (comment) compression dressing change     Wound Therapy - Frequency  -- 1x a week    Wound Therapy - Current Recommendations  PT;Surgery consult surgical consult for biopsy     Wound Plan  Pt LE to be cleansed, moisturized and multi layer compression bandaging system with manual  to begin if biopsy is (-).  return to compression wrapping once pt has been seen by MD     Dressing   xeroform to LE, silverhydrofiber followed by ABpatd to control drainage with medipore tape with pressure over wound to attempt to decrease hypergranulation.              PT Education - 07/14/17 1049    Education provided  Yes    Education Details  Keep dressing clean and dry  if drainage is coming through may remove, cleanse wound and redress.     Comprehension  Verbalized understanding       PT Short Term Goals - 07/07/17 1026      PT SHORT TERM GOAL #1   Title  Wound to have no hypergranulation     Time  3    Period  Weeks    Status  On-going      PT SHORT TERM GOAL #2   Title  PT to understand the need to wear compression on his LE to decrease edema.    Time  2    Period  Weeks    Status  Achieved      PT SHORT TERM GOAL #3   Title  PT to have had wound biopsied     Time  2    Period  Weeks    Status  On-going        PT Long Term Goals - 07/07/17 1027      PT LONG TERM GOAL #1   Title  Wound to be no greater than .3x.3 with no elevation to allow pt to feel comfortable in self care.     Time  4    Period  Weeks    Status  On-going      PT LONG TERM GOAL #2   Title  Wound to have scant drainage to reduce risk of infection     Time  4    Period  Weeks    Status  On-going      PT LONG TERM GOAL #3   Title  Pt to have obtained and to be able to don and doff compression garment.    Time  4    Period  Weeks    Status  On-going            Plan - 07/14/17 1046    Clinical Impression Statement  see above     Rehab Potential  Good    PT Frequency  1x / week    PT Duration  4 weeks    PT Treatment/Interventions  ADLs/Self Care Home Management;Manual techniques;Manual lymph drainage;Compression bandaging;Other (comment) debridement if needed     PT Next Visit Plan   Continue with cleansing and resume compression dressing .  If biopsy is negative may  begin manual decongestive techniques.        Patient will benefit from skilled therapeutic intervention in order to improve the following deficits and impairments:  Other (comment)(nonhealing wound)  Visit Diagnosis: Lymphedema, not elsewhere classified  Leg wound, right, sequela     Problem List Patient Active Problem List   Diagnosis Date Noted  . Recurrent falls 05/07/2017  . Full code status 05/07/2017  . Subdural hematoma ([place]) 05/06/2017  . Blood in stool   . Diarrhea in adult patient   . Heme positive stool   . Perineal abscess   . Recto-prostatic fistula   . MRSA bacteremia   . Staphylococcus aureus bacteremia with sepsis ([place])   . Severe sepsis with septic shock ([place])   . Acute renal failure ([place])   . Metabolic acidosis   . Melena   . Altered mental status 04/18/2017  . 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 Normocytic anemia 05/16/2014  . Colon cancer screening 01/14/2014  . Lymphedema 02/25/2013  . Leg edema, right 05/04/2012  . Varicose veins of lower extremities with other complications 75/04/2584  . Hepatitis C 08/28/2011  . HYPERTENSION 01/10/2010  .  NEOPLASM, MALIGNANT, PROSTATE, HX OF 01/10/2010  . DIABETES MELLITUS, TYPE II 01/04/2010  . 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    Rayetta Humphrey, PT CLT [phone] 07/14/2017, 10:53 AM  [place] [address] [place], [place], [address] Phone: [phone]   Fax:  [phone]  Name: Colin [name] MRN: [id number] Date of Birth: 26-Jan-1941

## 2017-07-15 ENCOUNTER — Encounter (HOSPITAL_COMMUNITY): Payer: Self-pay | Admitting: *Deleted

## 2017-07-15 ENCOUNTER — Emergency Department (HOSPITAL_COMMUNITY)
Admission: EM | Admit: 2017-07-15 | Discharge: 2017-07-15 | Discharge status: Against Medical Advice | Payer: Medicare Other | Attending: Emergency Medicine | Admitting: Emergency Medicine

## 2017-07-15 ENCOUNTER — Emergency Department (HOSPITAL_COMMUNITY): Payer: Medicare Other

## 2017-07-15 DIAGNOSIS — Z5189 Encounter for other specified aftercare: Secondary | ICD-10-CM

## 2017-07-15 DIAGNOSIS — N183 Chronic kidney disease, stage 3 (moderate): Secondary | ICD-10-CM | POA: Diagnosis not present

## 2017-07-15 DIAGNOSIS — T8453XS Infection and inflammatory reaction due to internal right knee prosthesis, sequela: Secondary | ICD-10-CM | POA: Diagnosis not present

## 2017-07-15 DIAGNOSIS — E1122 Type 2 diabetes mellitus with diabetic chronic kidney disease: Secondary | ICD-10-CM | POA: Insufficient documentation

## 2017-07-15 DIAGNOSIS — I129 Hypertensive chronic kidney disease with stage 1 through stage 4 chronic kidney disease, or unspecified chronic kidney disease: Secondary | ICD-10-CM | POA: Diagnosis not present

## 2017-07-15 DIAGNOSIS — Z87891 Personal history of nicotine dependence: Secondary | ICD-10-CM | POA: Insufficient documentation

## 2017-07-15 DIAGNOSIS — Y939 Activity, unspecified: Secondary | ICD-10-CM | POA: Diagnosis not present

## 2017-07-15 DIAGNOSIS — Z8546 Personal history of malignant neoplasm of prostate: Secondary | ICD-10-CM | POA: Insufficient documentation

## 2017-07-15 DIAGNOSIS — Y929 Unspecified place or not applicable: Secondary | ICD-10-CM | POA: Insufficient documentation

## 2017-07-15 DIAGNOSIS — S81001D Unspecified open wound, right knee, subsequent encounter: Secondary | ICD-10-CM | POA: Insufficient documentation

## 2017-07-15 DIAGNOSIS — Y999 Unspecified external cause status: Secondary | ICD-10-CM | POA: Insufficient documentation

## 2017-07-15 DIAGNOSIS — E119 Type 2 diabetes mellitus without complications: Secondary | ICD-10-CM | POA: Diagnosis not present

## 2017-07-15 DIAGNOSIS — X58XXXD Exposure to other specified factors, subsequent encounter: Secondary | ICD-10-CM | POA: Insufficient documentation

## 2017-07-15 DIAGNOSIS — Z5329 Procedure and treatment not carried out because of patient's decision for other reasons: Secondary | ICD-10-CM | POA: Insufficient documentation

## 2017-07-15 DIAGNOSIS — S81001A Unspecified open wound, right knee, initial encounter: Secondary | ICD-10-CM | POA: Diagnosis not present

## 2017-07-15 DIAGNOSIS — I279 Pulmonary heart disease, unspecified: Secondary | ICD-10-CM | POA: Diagnosis not present

## 2017-07-15 DIAGNOSIS — Z96653 Presence of artificial knee joint, bilateral: Secondary | ICD-10-CM | POA: Diagnosis not present

## 2017-07-15 DIAGNOSIS — M9689 Other intraoperative and postprocedural complications and disorders of the musculoskeletal system: Secondary | ICD-10-CM | POA: Diagnosis not present

## 2017-07-15 LAB — CBC WITH DIFFERENTIAL/PLATELET
BASOS ABS: 0 10*3/uL (ref 0.0–0.1)
Basophils Relative: 0 %
EOS PCT: 7 %
Eosinophils Absolute: 0.6 10*3/uL (ref 0.0–0.7)
HCT: 33.4 % — ABNORMAL LOW (ref 39.0–52.0)
Hemoglobin: 10.4 g/dL — ABNORMAL LOW (ref 13.0–17.0)
LYMPHS PCT: 24 %
Lymphs Abs: 2 10*3/uL (ref 0.7–4.0)
MCH: 28.3 pg (ref 26.0–34.0)
MCHC: 31.1 g/dL (ref 30.0–36.0)
MCV: 91 fL (ref 78.0–100.0)
Monocytes Absolute: 0.8 10*3/uL (ref 0.1–1.0)
Monocytes Relative: 10 %
Neutro Abs: 4.7 10*3/uL (ref 1.7–7.7)
Neutrophils Relative %: 59 %
PLATELETS: 359 10*3/uL (ref 150–400)
RBC: 3.67 MIL/uL — AB (ref 4.22–5.81)
RDW: 15.2 % (ref 11.5–15.5)
WBC: 8 10*3/uL (ref 4.0–10.5)

## 2017-07-15 LAB — URINALYSIS, ROUTINE W REFLEX MICROSCOPIC
BILIRUBIN URINE: NEGATIVE
Glucose, UA: NEGATIVE mg/dL
KETONES UR: NEGATIVE mg/dL
NITRITE: NEGATIVE
PH: 6 (ref 5.0–8.0)
Protein, ur: NEGATIVE mg/dL
Specific Gravity, Urine: 1.009 (ref 1.005–1.030)
Squamous Epithelial / LPF: NONE SEEN

## 2017-07-15 LAB — COMPREHENSIVE METABOLIC PANEL
ALT: 18 U/L (ref 17–63)
AST: 24 U/L (ref 15–41)
Albumin: 3.5 g/dL (ref 3.5–5.0)
Alkaline Phosphatase: 128 U/L — ABNORMAL HIGH (ref 38–126)
Anion gap: 15 (ref 5–15)
BILIRUBIN TOTAL: 0.5 mg/dL (ref 0.3–1.2)
BUN: 55 mg/dL — AB (ref 6–20)
CO2: 22 mmol/L (ref 22–32)
Calcium: 8.8 mg/dL — ABNORMAL LOW (ref 8.9–10.3)
Chloride: 96 mmol/L — ABNORMAL LOW (ref 101–111)
Creatinine, Ser: 1.61 mg/dL — ABNORMAL HIGH (ref 0.61–1.24)
GFR calc Af Amer: 47 mL/min — ABNORMAL LOW (ref >60)
GFR, EST NON AFRICAN AMERICAN: 40 mL/min — AB (ref >60)
Glucose, Bld: 128 mg/dL — ABNORMAL HIGH (ref 65–99)
POTASSIUM: 3.8 mmol/L (ref 3.5–5.1)
Sodium: 133 mmol/L — ABNORMAL LOW (ref 135–145)
TOTAL PROTEIN: 8.2 g/dL — AB (ref 6.5–8.1)

## 2017-07-15 LAB — PROTIME-INR
INR: 1.07
PROTHROMBIN TIME: 13.9 s (ref 11.4–15.2)

## 2017-07-15 LAB — LACTIC ACID, PLASMA
Lactic Acid, Venous: 1.6 mmol/L (ref 0.5–1.9)
Lactic Acid, Venous: 1.2 mmol/L (ref 0.5–1.9)

## 2017-07-15 MED ORDER — VANCOMYCIN HCL IN DEXTROSE 1-5 GM/200ML-% IV SOLN
1000.0000 mg | Freq: Once | INTRAVENOUS | Status: AC
  Administered 2017-07-15: 1000 mg via INTRAVENOUS
  Filled 2017-07-15: qty 200

## 2017-07-15 NOTE — ED Triage Notes (Signed)
[facility] doctors sent pt here for wound to right knee. Been there for "awhile", has been seen at wound care center.

## 2017-07-15 NOTE — ED Notes (Signed)
Currently waiting for pt.'s Colin [name]

## 2017-07-15 NOTE — ED Provider Notes (Signed)
Emergency Department Provider Note   I have reviewed the triage vital signs and the nursing notes.   HISTORY  Chief Complaint Wound Check   HPI Colin [name] is a 77 y.o. male who was sent here by his primary care doctor is of a non-healing wound to his right leg.  Patient is not a great historian.  Review of records it appears he has been going to rehabilitation for a couple weeks to get this wound taken care of.  I spoke with his primary doctor who states that the wound is not healing even with oral antibiotics and felt that he needed an MRI and IV antibiotics so was sent here. No other associated or modifying symptoms.    Past Medical History:  Diagnosis Date  . Arthritis   . Bilateral lower extremity edema   . Chronic venous insufficiency   . CKD (chronic kidney disease), stage III ([place])   . History of alcohol abuse    none since 2013 per pt  . History of chronic hepatitis    Genotype 1, FO/F1-- treated w/ harvoni (completed May 2016)  . History of external beam radiation therapy    2007 prostate  . History of intravenous drug use in remission    per pt in remission since 1980's  . HOH (hard of hearing)   . Hypertension   . Normocytic anemia   . Recurrent prostate cancer [facility]) urologist-  dr Junious Silk   dx 2007  s/p  external beam radioation therapy/ recurrent 12-2015  Gleason 4+4  . Type 2 diabetes, diet controlled ([place])    no meds since 2010 approx    Patient Active Problem List   Diagnosis Date Noted  . Recurrent falls 05/07/2017  . Full code status 05/07/2017  . Subdural hematoma ([place]) 05/06/2017  . Blood in stool   . Diarrhea in adult patient   . Heme positive stool   . Perineal abscess   . Recto-prostatic fistula   . MRSA bacteremia   . Staphylococcus aureus bacteremia with sepsis ([place])   . Severe sepsis with septic shock ([place])   . Acute renal failure ([place])   . Metabolic acidosis   . Melena   . Altered mental status 04/18/2017  . Dilation of  biliary tract 01/29/2016  . Normocytic anemia 05/16/2014  . Colon cancer screening 01/14/2014  . Lymphedema 02/25/2013  . Leg edema, right 05/04/2012  . Varicose veins of lower extremities with other complications 36/46/8032  . Hepatitis C 08/28/2011  . HYPERTENSION 01/10/2010  . NEOPLASM, MALIGNANT, PROSTATE, HX OF 01/10/2010  . DIABETES MELLITUS, TYPE II 01/04/2010  . 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    Past Surgical History:  Procedure Laterality Date  . CATARACT EXTRACTION W/PHACO Left 12/14/2012   Procedure: CATARACT EXTRACTION PHACO AND INTRAOCULAR LENS PLACEMENT (IOC);  Surgeon: Tonny Branch, MD;  Location: [facility];  Service: Ophthalmology;  Laterality: Left;  CDE: 13.74  . CATARACT EXTRACTION W/PHACO Right 12/28/2012   Procedure: CATARACT EXTRACTION PHACO AND INTRAOCULAR LENS PLACEMENT (IOC);  Surgeon: Tonny Branch, MD;  Location: [facility];  Service: Ophthalmology;  Laterality: Right;  CDE:12.80  . COLONOSCOPY N/A 02/14/2014   [id number] colonic polyps-removed as described above Colonic diverticulosis (tubular adenoma)  . CRYOABLATION N/A 05/24/2016   Procedure: CRYO ABLATION PROSTATE;  Surgeon: Festus Aloe, MD;  Location: [place] [facility];  Service: Urology;  Laterality: N/A;  . ENDOVENOUS ABLATION SAPHENOUS VEIN W/ LASER  2013   Right leg X 2 by Dr. Debara Pickett  .  ESOPHAGOGASTRODUODENOSCOPY N/A 02/14/2014   [id number]:[id number] large pedunculated gastric polyp (hyperplastic)  . INCISION AND DRAINAGE OF PERITONSILLAR ABCESS  04/24/2017   Procedure: INCISION AND DRAINAGE OF PERINEUM ABCESS;  Surgeon: Franchot Gallo, MD;  Location: [place];  Service: Urology;;  . INSERTION OF SUPRAPUBIC CATHETER N/A 04/24/2017   Procedure: OPEN INSERTION OF SUPRAPUBIC CATHETER;  Surgeon: Franchot Gallo, MD;  Location: [place];  Service: Urology;  Laterality: N/A;  . LAPAROSCOPIC DIVERTED COLOSTOMY N/A 04/24/2017   Procedure: LAPAROSCOPIC SIGMOID COLOSTOMY;  Surgeon: Ileana Roup,  MD;  Location: [place];  Service: General;  Laterality: N/A;  . REIMPLANTATION OF TOTAL KNEE Left 03/16/2010  . TONSILLECTOMY  child  . TOTAL KNEE  PROSTHESIS REMOVAL W/ SPACER INSERTION Left 12/29/2009  . TOTAL KNEE ARTHROPLASTY Bilateral left 2007/   right 2005 approx    Current Outpatient Rx  . Order #: [id number] Class: Historical Med  . Order #: [id number] Class: Historical Med  . Order #: [id number] Class: Historical Med  . Order #: [id number] Class: Historical Med  . Order #: [id number] Class: Historical Med  . Order #: [id number] Class: Historical Med    Allergies Patient has no known allergies.  Family History  Problem Relation Age of Onset  . Colon cancer Neg Hx   . Liver disease Neg Hx     Social History Social History   Tobacco Use  . Smoking status: Former Smoker    Years: 12.00    Last attempt to quit: 05/04/1972    Years since quitting: 45.2  . Smokeless tobacco: Never Used  Substance Use Topics  . Alcohol use: No    Alcohol/week: 0.0 oz    Comment: hx alcohol abuse-- per pt  none since 2013.  . Drug use: No    Comment: Previous IV drug user ovr 30 yrs ago--  per pt quit 1980's    Review of Systems  All other systems negative except as documented in the HPI. All pertinent positives and negatives as reviewed in the HPI. ____________________________________________   PHYSICAL EXAM:  VITAL SIGNS: ED Triage Vitals  Enc Vitals Group     BP 07/15/17 1107 119/68     Pulse Rate 07/15/17 1107 89     Resp 07/15/17 1107 20     Temp 07/15/17 1107 97.8 F (36.6 C)     Temp Source 07/15/17 1107 Oral     SpO2 07/15/17 1107 100 %     Weight 07/15/17 1105 180 lb (81.6 kg)     Height 07/15/17 1105 5\' 7"  (1.702 m)    Constitutional: Alert and oriented. Well appearing and in no acute distress. Eyes: Conjunctivae are normal. PERRL. EOMI. Head: Atraumatic. Nose: No congestion/rhinnorhea. Mouth/Throat: Mucous membranes are moist.  Oropharynx non-erythematous. Neck: No  stridor.  No meningeal signs.   Cardiovascular: Normal rate, regular rhythm. Good peripheral circulation. Grossly normal heart sounds.   Respiratory: Normal respiratory effort.  No retractions. Lungs CTAB. Gastrointestinal: Soft and nontender. No distention.  Musculoskeletal: No lower extremity tenderness nor edema. No gross deformities of extremities. Neurologic:  Normal speech and language. No gross focal neurologic deficits are appreciated.  Skin:  Skin is warm, dry and has a wound with mild surrounding erythema, swelling and what appears to be purulent discharge on the bandage to his right lateral proximal tibia.. No rash noted.   ____________________________________________   LABS (all labs ordered are listed, but only abnormal results are displayed)  Labs Reviewed  COMPREHENSIVE METABOLIC PANEL - Abnormal; Notable for the following components:  Result Value   Sodium 133 (*)    Chloride 96 (*)    Glucose, Bld 128 (*)    BUN 55 (*)    Creatinine, Ser 1.61 (*)    Calcium 8.8 (*)    Total Protein 8.2 (*)    Alkaline Phosphatase 128 (*)    GFR calc non Af Amer 40 (*)    GFR calc Af Amer 47 (*)    All other components within normal limits  CBC WITH DIFFERENTIAL/PLATELET - Abnormal; Notable for the following components:   RBC 3.67 (*)    Hemoglobin 10.4 (*)    HCT 33.4 (*)    All other components within normal limits  URINALYSIS, ROUTINE W REFLEX MICROSCOPIC - Abnormal; Notable for the following components:   APPearance CLOUDY (*)    Hgb urine dipstick SMALL (*)    Leukocytes, UA LARGE (*)    Bacteria, UA FEW (*)    All other components within normal limits  CULTURE, BLOOD (ROUTINE X 2)  CULTURE, BLOOD (ROUTINE X 2)  PROTIME-INR  LACTIC ACID, PLASMA  LACTIC ACID, PLASMA   ____________________________________________  RADIOLOGY  Dg Chest 2 View  Result Date: 07/15/2017 CLINICAL DATA:  Nonhealing wound of the lateral aspect of the right knee. The patient is  undergoing evaluation at a wound care center. EXAM: CHEST  2 VIEW COMPARISON:  Chest x-ray of April 18, 2017 FINDINGS: The lungs are well-expanded. There is no focal infiltrate. There is no pleural effusion. The heart and pulmonary vascularity are normal. There calcification in the wall of the aortic arch. There is mild multilevel degenerative disc disease of the thoracic spine. IMPRESSION: There is no active cardiopulmonary disease. Thoracic aortic atherosclerosis. Electronically Signed   By: David  Martinique M.D.   On: 07/15/2017 11:34   Dg Knee Complete 4 Views Right  Result Date: 07/15/2017 CLINICAL DATA:  Nonhealing wound over the lateral aspect of the right knee. EXAM: RIGHT KNEE - COMPLETE 4+ VIEW COMPARISON:  Right knee radiograph from [facility] dated May 10, 2011 FINDINGS: There is a right knee arthroplasty. Radiographic positioning of the prosthetic components is good. The interface with the native bone appears normal. No joint effusion is observed. There is bandage material over the lateral aspect of the soft tissues at the level of the knee joint and slightly inferior to this. IMPRESSION: No objective evidence of osteomyelitis. No evidence of loosening of the prosthesis. There is a cutaneous wound over the lateral aspect of the knee. Electronically Signed   By: David  Martinique M.D.   On: 07/15/2017 11:37    ____________________________________________   PROCEDURES  Procedure(s) performed:   Procedures   ____________________________________________   INITIAL IMPRESSION / ASSESSMENT AND PLAN / ED COURSE  Discussed with PCP, Dr. Hilma Favors, who felt this was a nonhealing wound and infection that  Needed IV antibiotics and consideration of MRI to ensure joint was not infected. Will discuss with hospitalist regarding same.   Hospitalist evaluated patient and he refused admission. Competent to make this decision and understands risks of doing so. Will continue to follow up  with PCP/wound care. D/C AMA.  Pertinent labs & imaging results that were available during my care of the patient were reviewed by me and considered in my medical decision making (see chart for details).  ____________________________________________  FINAL CLINICAL IMPRESSION(S) / ED DIAGNOSES  Final diagnoses:  Visit for wound check     MEDICATIONS GIVEN DURING THIS VISIT:  Medications  vancomycin (VANCOCIN) IVPB 1000 mg/200  mL premix (1,000 mg Intravenous New Bag/Given 07/15/17 1526)     NEW OUTPATIENT MEDICATIONS STARTED DURING THIS VISIT:  This SmartLink is deprecated. Use AVSMEDLIST instead to display the medication list for a patient.  Note:  This note was prepared with assistance of Dragon voice recognition software. Occasional wrong-word or sound-a-like substitutions may have occurred due to the inherent limitations of voice recognition software.   Merrily Pew, MD 07/15/17 [phone]

## 2017-07-20 LAB — CULTURE, BLOOD (ROUTINE X 2)
Culture: NO GROWTH
Culture: NO GROWTH
SPECIAL REQUESTS: ADEQUATE
Special Requests: ADEQUATE

## 2017-07-21 ENCOUNTER — Ambulatory Visit (HOSPITAL_COMMUNITY): Payer: Medicare Other | Admitting: Physical Therapy

## 2017-07-21 ENCOUNTER — Encounter (HOSPITAL_COMMUNITY): Payer: Self-pay | Admitting: Physical Therapy

## 2017-07-21 DIAGNOSIS — I890 Lymphedema, not elsewhere classified: Secondary | ICD-10-CM | POA: Diagnosis not present

## 2017-07-21 DIAGNOSIS — S81801S Unspecified open wound, right lower leg, sequela: Secondary | ICD-10-CM

## 2017-07-21 NOTE — Therapy (Signed)
[place], [place], [address] Phone: [phone]   Fax:  [phone]  Wound Care Therapy  Patient Details  Name: Colin [name] MRN: [id number] Date of Birth: 26-Mar-1941 Referring Provider: Sharilyn Sites    Encounter Date: 07/21/2017  PT End of Session - 07/21/17 1143    Visit Number  4    Number of Visits  6    Date for PT Re-Evaluation  08/02/17    Authorization Type  [employer] medicare    Authorization - Visit Number  4    Authorization - Number of Visits  6    PT Start Time  0900    PT Stop Time  0940    PT Time Calculation (min)  40 min    Activity Tolerance  Patient tolerated treatment well    Behavior During Therapy  [facility] for tasks assessed/performed       Past Medical History:  Diagnosis Date  . Arthritis   . Bilateral lower extremity edema   . Chronic venous insufficiency   . CKD (chronic kidney disease), stage III ([place])   . History of alcohol abuse    none since 2013 per pt  . History of chronic hepatitis    Genotype 1, FO/F1-- treated w/ harvoni (completed May 2016)  . History of external beam radiation therapy    2007 prostate  . History of intravenous drug use in remission    per pt in remission since 1980's  . HOH (hard of hearing)   . Hypertension   . Normocytic anemia   . Recurrent prostate cancer [facility]) urologist-  dr Junious Silk   dx 2007  s/p  external beam radioation therapy/ recurrent 12-2015  Gleason 4+4  . Type 2 diabetes, diet controlled ([place])    no meds since 2010 approx    Past Surgical History:  Procedure Laterality Date  . CATARACT EXTRACTION W/PHACO Left 12/14/2012   Procedure: CATARACT EXTRACTION PHACO AND INTRAOCULAR LENS PLACEMENT (IOC);  Surgeon: Tonny Branch, MD;  Location: [facility];  Service: Ophthalmology;  Laterality: Left;  CDE: 13.74  . CATARACT EXTRACTION W/PHACO Right 12/28/2012   Procedure: CATARACT EXTRACTION PHACO AND INTRAOCULAR LENS PLACEMENT (IOC);  Surgeon: Tonny Branch, MD;  Location: [facility];  Service: Ophthalmology;  Laterality: Right;  CDE:12.80  . COLONOSCOPY N/A 02/14/2014   QAS:[id number] colonic polyps-removed as described above Colonic diverticulosis (tubular adenoma)  . CRYOABLATION N/A 05/24/2016   Procedure: CRYO ABLATION PROSTATE;  Surgeon: Festus Aloe, MD;  Location[facility];  Service: Urology;  Laterality: N/A;  . ENDOVENOUS ABLATION SAPHENOUS VEIN W/ LASER  2013   Right leg X 2 by Dr. Debara Pickett  . ESOPHAGOGASTRODUODENOSCOPY N/A 02/14/2014   LNL:[id number] large pedunculated gastric polyp (hyperplastic)  . INCISION AND DRAINAGE OF PERITONSILLAR ABCESS  04/24/2017   Procedure: INCISION AND DRAINAGE OF PERINEUM ABCESS;  Surgeon: Franchot Gallo, MD;  Location: [facility];  Service: Urology;;  . INSERTION OF SUPRAPUBIC CATHETER N/A 04/24/2017   Procedure: OPEN INSERTION OF SUPRAPUBIC CATHETER;  Surgeon: Franchot Gallo, MD;  Location: [facility];  Service: Urology;  Laterality: N/A;  . LAPAROSCOPIC DIVERTED COLOSTOMY N/A 04/24/2017   Procedure: LAPAROSCOPIC SIGMOID COLOSTOMY;  Surgeon: Ileana Roup, MD;  Location: [place];  Service: General;  Laterality: N/A;  . REIMPLANTATION OF TOTAL KNEE Left 03/16/2010  . TONSILLECTOMY  child  . TOTAL KNEE  PROSTHESIS REMOVAL W/ SPACER INSERTION Left 12/29/2009  . TOTAL KNEE ARTHROPLASTY Bilateral left 2007/  right 2005 approx    There were no vitals filed for this visit.              Wound Therapy - 07/21/17 0942    Subjective Pt states that he went to the MD who just sent him the the ER.  States that he sat all day in the ER and They just told him to continue coming to therapy.    Patient and Family Stated Goals  wound to heal    Date of Onset  05/08/17    Prior Treatments  self care     Pain Assessment  No/denies pain    Pain Score  0-No pain    Evaluation and Treatment Procedures Explained to Patient/Family  Yes    Evaluation and Treatment Procedures  agreed to     Wound Properties Date First Assessed: 07/03/17 Time First Assessed: 4193 Wound Type: Other (Comment) Location: Leg Location Orientation: Right;Lateral Wound Description (Comments): superior aspect hypergranulated  Present on Admission: Yes   Dressing Type  Compression wrap    Dressing Changed  Changed    Dressing Status  Old drainage    Dressing Change Frequency  PRN    Site / Wound Assessment  Clean;Friable;Pale    % Wound base Red or Granulating  80%    % Wound base Yellow/Fibrinous Exudate  20% Tissue that is outlying now has yellow exudate anteriorly    Peri-wound Assessment  Edema;Other (Comment) small red dots around wound; scratch marks on LE     Drainage Amount  Copious    Drainage Description  Serous    Treatment  Cleansed;Other (Comment) moisturized and redressed     Wound Therapy - Clinical Statement  Attempted to get a hold of PA [employer] re why pt was not referred for a biopsy but sent to the ER but unable to do so; therapist will continue to try and contact referring provider.  PT LE no longer has the scattered small openings throughout his LE, however, tissue from wound continues to grow outside of woundbed.   At this time if therapist puts pressure on pt leg increased drainage comes from the top of the tissue outside of the wound bed.  LE was cleansed and moisturized.  Pt will be continued to be seen weekly to assess the wound environment as therapist continues to attempt to get the wound biopsied.   Therapist attempted to call referring MD re biopsy referral all day but was unable to contact MD will attempt tomorrow.    Factors Delaying/Impairing Wound Healing  Multiple medical problems;Polypharmacy    Hydrotherapy Plan  Dressing change;Patient/family education;Other (comment) compression dressing change     Wound Therapy - Frequency  -- 1x a week    Wound Therapy - Current Recommendations  PT;Surgery consult surgical consult for biopsy     Wound Plan  Pt LE to be cleansed,  moisturized and multi layer compression bandaging system with manual to begin if biopsy is (-).  return to compression wrapping once pt has been seen by MD     Dressing   , silverhydrofiber followed by profore lite with medipore tape placed over woud area to attempt extra pressure.                  PT Short Term Goals - 07/07/17 1026      PT SHORT TERM GOAL #1   Title  Wound to have no hypergranulation     Time  3  Period  Weeks    Status  On-going      PT SHORT TERM GOAL #2   Title  PT to understand the need to wear compression on his LE to decrease edema.    Time  2    Period  Weeks    Status  Achieved      PT SHORT TERM GOAL #3   Title  PT to have had wound biopsied     Time  2    Period  Weeks    Status  On-going        PT Long Term Goals - 07/07/17 1027      PT LONG TERM GOAL #1   Title  Wound to be no greater than .3x.3 with no elevation to allow pt to feel comfortable in self care.     Time  4    Period  Weeks    Status  On-going      PT LONG TERM GOAL #2   Title  Wound to have scant drainage to reduce risk of infection     Time  4    Period  Weeks    Status  On-going      PT LONG TERM GOAL #3   Title  Pt to have obtained and to be able to don and doff compression garment.    Time  4    Period  Weeks    Status  On-going            Plan - 07/21/17 1144    Clinical Impression Statement  see above    Rehab Potential  Good    PT Frequency  1x / week    PT Duration  4 weeks    PT Treatment/Interventions  ADLs/Self Care Home Management;Manual techniques;Manual lymph drainage;Compression bandaging;Other (comment) debridement if needed     PT Next Visit Plan   Continue with cleansing and resume compression dressing .  Continue to attempt to get wound biopsied.        Patient will benefit from skilled therapeutic intervention in order to improve the following deficits and impairments:  Other (comment)(nonhealing wound)  Visit  Diagnosis: Lymphedema, not elsewhere classified  Leg wound, right, sequela     Problem List Patient Active Problem List   Diagnosis Date Noted  . Recurrent falls 05/07/2017  . Full code status 05/07/2017  . Subdural hematoma ([place]) 05/06/2017  . Blood in stool   . Diarrhea in adult patient   . Heme positive stool   . Perineal abscess   . Recto-prostatic fistula   . MRSA bacteremia   . Staphylococcus aureus bacteremia with sepsis ([place])   . Severe sepsis with septic shock ([place])   . Acute renal failure ([place])   . Metabolic acidosis   . Melena   . Altered mental status 04/18/2017  . 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 Normocytic anemia 05/16/2014  . Colon cancer screening 01/14/2014  . Lymphedema 02/25/2013  . Leg edema, right 05/04/2012  . Varicose veins of lower extremities with other complications 56/43/3295  . Hepatitis C 08/28/2011  . HYPERTENSION 01/10/2010  . NEOPLASM, MALIGNANT, PROSTATE, HX OF 01/10/2010  . DIABETES MELLITUS, TYPE II 01/04/2010  . 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    Rayetta Humphrey, PT CLT [phone] 07/21/2017, 11:46 AM  [facility] [address] [place], [place], [address] Phone: [phone]   Fax:  [phone]  Name: Colin [name] MRN: [id number] Date of Birth:  11/21/1940    

## 2017-07-22 ENCOUNTER — Ambulatory Visit (INDEPENDENT_AMBULATORY_CARE_PROVIDER_SITE_OTHER): Payer: Medicare Other | Admitting: Urology

## 2017-07-22 DIAGNOSIS — N35014 Post-traumatic urethral stricture, male, unspecified: Secondary | ICD-10-CM | POA: Diagnosis not present

## 2017-07-28 ENCOUNTER — Ambulatory Visit (HOSPITAL_COMMUNITY): Payer: Medicare Other | Admitting: Physical Therapy

## 2017-07-28 ENCOUNTER — Encounter (HOSPITAL_COMMUNITY): Payer: Self-pay | Admitting: Physical Therapy

## 2017-07-28 DIAGNOSIS — I890 Lymphedema, not elsewhere classified: Secondary | ICD-10-CM

## 2017-07-28 DIAGNOSIS — S81801S Unspecified open wound, right lower leg, sequela: Secondary | ICD-10-CM

## 2017-07-28 NOTE — Therapy (Signed)
[place], [place], [address] Phone: [phone]   Fax:  [phone]  Wound Care Therapy  Patient Details  Name: Colin [name] MRN: [id number] Date of Birth: February 09, 1941 Referring Provider: Sharilyn Sites    Encounter Date: 07/28/2017  PT End of Session - 07/28/17 1159    Visit Number  5    Number of Visits  6    Date for PT Re-Evaluation  08/02/17    Authorization Type  [employer] medicare    Authorization - Visit Number  5    Authorization - Number of Visits  6    PT Start Time  0900    PT Stop Time  0940    PT Time Calculation (min)  40 min    Activity Tolerance  Patient tolerated treatment well    Behavior During Therapy  [facility] for tasks assessed/performed       Past Medical History:  Diagnosis Date  . Arthritis   . Bilateral lower extremity edema   . Chronic venous insufficiency   . CKD (chronic kidney disease), stage III ([place])   . History of alcohol abuse    none since 2013 per pt  . History of chronic hepatitis    Genotype 1, FO/F1-- treated w/ harvoni (completed May 2016)  . History of external beam radiation therapy    2007 prostate  . History of intravenous drug use in remission    per pt in remission since 1980's  . HOH (hard of hearing)   . Hypertension   . Normocytic anemia   . Recurrent prostate cancer [facility]) urologist-  dr Junious Silk   dx 2007  s/p  external beam radioation therapy/ recurrent 12-2015  Gleason 4+4  . Type 2 diabetes, diet controlled ([place])    no meds since 2010 approx    Past Surgical History:  Procedure Laterality Date  . CATARACT EXTRACTION W/PHACO Left 12/14/2012   Procedure: CATARACT EXTRACTION PHACO AND INTRAOCULAR LENS PLACEMENT (IOC);  Surgeon: Tonny Branch, MD;  Location: [facility];  Service: Ophthalmology;  Laterality: Left;  CDE: 13.74  . CATARACT EXTRACTION W/PHACO Right 12/28/2012   Procedure: CATARACT EXTRACTION PHACO AND INTRAOCULAR LENS PLACEMENT (IOC);  Surgeon: Tonny Branch, MD;  Location: [facility];  Service: Ophthalmology;  Laterality: Right;  CDE:12.80  . COLONOSCOPY N/A 02/14/2014   YBO:[id number] colonic polyps-removed as described above Colonic diverticulosis (tubular adenoma)  . CRYOABLATION N/A 05/24/2016   Procedure: CRYO ABLATION PROSTATE;  Surgeon: Festus Aloe, MD;  Location: [facility];  Service: Urology;  Laterality: N/A;  . ENDOVENOUS ABLATION SAPHENOUS VEIN W/ LASER  2013   Right leg X 2 by Dr. Debara Pickett  . ESOPHAGOGASTRODUODENOSCOPY N/A 02/14/2014   NID:POEUMP large pedunculated gastric polyp (hyperplastic)  . INCISION AND DRAINAGE OF PERITONSILLAR ABCESS  04/24/2017   Procedure: INCISION AND DRAINAGE OF PERINEUM ABCESS;  Surgeon: Franchot Gallo, MD;  Location: [place];  Service: Urology;;  . INSERTION OF SUPRAPUBIC CATHETER N/A 04/24/2017   Procedure: OPEN INSERTION OF SUPRAPUBIC CATHETER;  Surgeon: Franchot Gallo, MD;  Location: [facility] [place];  Service: Urology;  Laterality: N/A;  . LAPAROSCOPIC DIVERTED COLOSTOMY N/A 04/24/2017   Procedure: LAPAROSCOPIC SIGMOID COLOSTOMY;  Surgeon: Ileana Roup, MD;  Location: [facility];  Service: General;  Laterality: N/A;  . REIMPLANTATION OF TOTAL KNEE Left 03/16/2010  . TONSILLECTOMY  child  . TOTAL KNEE  PROSTHESIS REMOVAL W/ SPACER INSERTION Left 12/29/2009  . TOTAL KNEE ARTHROPLASTY Bilateral left 2007/  right 2005 approx    There were no vitals filed for this visit.        LYMPHEDEMA/ONCOLOGY QUESTIONNAIRE - 07/28/17 1157      Right Lower Extremity Lymphedema   At Midpatella/Popliteal Crease  50 cm    30 cm Proximal to Floor at Lateral Plantar Foot  34.7 cm    20 cm Proximal to Floor at Lateral Plantar Foot  29.8 1    10  cm Proximal to Floor at Lateral Malleoli  24.7 cm    Circumference of ankle/heel  34.3 cm.    5 cm Proximal to 1st MTP Joint  24.2 cm    Across MTP Joint  22.7 cm           Wound Therapy - 07/28/17 1150    Subjective  Pt states that he still has  not heard from the MD office.  MD office called while pt was in department.  Message given to receptionist who stated she would send it back to Collene Mares that the pt needs a biopsy/surgical consult.      Patient and Family Stated Goals  wound to heal    Date of Onset  05/08/17    Prior Treatments  self care     Pain Assessment  No/denies pain    Evaluation and Treatment Procedures Explained to Patient/Family  Yes    Evaluation and Treatment Procedures  agreed to    Wound Properties Date First Assessed: 07/03/17 Time First Assessed: 8676 Wound Type: Other (Comment) Location: Leg Location Orientation: Right;Lateral Wound Description (Comments): superior aspect hypergranulated  Present on Admission: Yes   Dressing Type  Compression wrap    Dressing Changed  Changed    Dressing Status  Old drainage    Dressing Change Frequency  PRN    Site / Wound Assessment  Clean;Friable;Pale    % Wound base Red or Granulating  90%    % Wound base Yellow/Fibrinous Exudate  10% Tissue that is outlying now has yellow exudate anteriorly    Peri-wound Assessment  Edema;Other (Comment) small red dots around wound; lower thigh red with edema     Wound Length (cm)  1 cm was .9     Wound Width (cm)  1.1 cm was .9    Wound Depth (cm)  -- hypergranulation of .3     Wound Surface Area (cm^2)  1.1 cm^2    Drainage Amount  Copious    Drainage Description  Serous    Treatment  Cleansed;Other (Comment) compression dressing from foot to below knee.     Wound Therapy - Clinical Statement  Called MD office while pt was in department.  Still unable to get ahold of PA or MD.  Therapist left another message re concern that wound is hypergranuated, no pain and not improving.  Therapist feels wound should be biopsied.     Factors Delaying/Impairing Wound Healing  Multiple medical problems;Polypharmacy    Hydrotherapy Plan  Dressing change;Patient/family education;Other (comment) compression dressing change     Wound Therapy -  Frequency  -- 1x a week    Wound Therapy - Current Recommendations  PT;Surgery consult surgical consult for biopsy     Wound Plan  Pt will be discharged back to prrimary MD for further follow up and  referred to the next level.   return to compression wrapping once pt has been seen by MD     Dressing   , silverhydrofiber followed by profore lite with medipore tape placed over woud  area to attempt extra pressure.                  PT Short Term Goals - 07/28/17 1201      PT SHORT TERM GOAL #1   Title  Wound to have no hypergranulation     Time  3    Period  Weeks    Status  On-going      PT SHORT TERM GOAL #2   Title  PT to understand the need to wear compression on his LE to decrease edema.    Time  2    Period  Weeks    Status  Achieved      PT SHORT TERM GOAL #3   Title  PT to have had wound biopsied     Time  2    Period  Weeks    Status  On-going        PT Long Term Goals - 07/28/17 1201      PT LONG TERM GOAL #1   Title  Wound to be no greater than .3x.3 with no elevation to allow pt to feel comfortable in self care.     Time  4    Period  Weeks    Status  On-going      PT LONG TERM GOAL #2   Title  Wound to have scant drainage to reduce risk of infection     Time  4    Period  Weeks    Status  On-going      PT LONG TERM GOAL #3   Title  Pt to have obtained and to be able to don and doff compression garment.    Time  4    Period  Weeks    Status  On-going            Plan - 07/28/17 1159    Clinical Impression Statement  see above     Rehab Potential  Good    PT Frequency  1x / week    PT Duration  4 weeks    PT Treatment/Interventions  ADLs/Self Care Home Management;Manual techniques;Manual lymph drainage;Compression bandaging;Other (comment) debridement if needed     PT Next Visit Plan  Discharge back to primary MD for further follow up .        Patient will benefit from skilled therapeutic intervention in order to improve the following  deficits and impairments:  Other (comment)(nonhealing wound)  Visit Diagnosis: Lymphedema, not elsewhere classified  Leg wound, right, sequela     Problem List Patient Active Problem List   Diagnosis Date Noted  . Recurrent falls 05/07/2017  . Full code status 05/07/2017  . Subdural hematoma ([place]) 05/06/2017  . Blood in stool   . Diarrhea in adult patient   . Heme positive stool   . Perineal abscess   . Recto-prostatic fistula   . MRSA bacteremia   . Staphylococcus aureus bacteremia with sepsis ([place])   . Severe sepsis with septic shock ([place])   . Acute renal failure ([place])   . Metabolic acidosis   . Melena   . Altered mental status 04/18/2017  . 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 Normocytic anemia 05/16/2014  . Colon cancer screening 01/14/2014  . Lymphedema 02/25/2013  . Leg edema, right 05/04/2012  . Varicose veins of lower extremities with other complications 66/44/0347  . Hepatitis C 08/28/2011  . HYPERTENSION 01/10/2010  . NEOPLASM, MALIGNANT, PROSTATE, HX OF 01/10/2010  . DIABETES MELLITUS, TYPE  II 01/04/2010  . 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    Rayetta Humphrey, PT CLT [phone] 07/28/2017, 12:02 PM  [facility] [address] [place], [place], [address] Phone: [phone]   Fax:  [phone]  Name: Colin [name] MRN: [id number] Date of Birth: [birth year]

## 2017-08-04 ENCOUNTER — Ambulatory Visit (HOSPITAL_COMMUNITY): Payer: Medicare Other | Admitting: Physical Therapy

## 2017-08-04 ENCOUNTER — Encounter (HOSPITAL_COMMUNITY): Payer: Self-pay | Admitting: Physical Therapy

## 2017-08-04 ENCOUNTER — Other Ambulatory Visit: Payer: Self-pay

## 2017-08-04 DIAGNOSIS — I890 Lymphedema, not elsewhere classified: Secondary | ICD-10-CM

## 2017-08-04 DIAGNOSIS — S81801S Unspecified open wound, right lower leg, sequela: Secondary | ICD-10-CM | POA: Diagnosis not present

## 2017-08-04 NOTE — Therapy (Signed)
[place], [place], [address] Phone: [phone]   Fax:  [phone]  Wound Care Therapy/Discharge due to no progress  Patient Details  Name: Colin [name] MRN: [id number] Date of Birth: 11/10/1940 Referring Provider: Sharilyn Sites    Encounter Date: 08/04/2017  PT End of Session - 08/04/17 1041    Visit Number  6    Number of Visits  6    Date for PT Re-Evaluation  08/02/17    Authorization Type  [employer] medicare    Authorization - Visit Number  6    Authorization - Number of Visits  6    PT Start Time  0903    PT Stop Time  0942    PT Time Calculation (min)  39 min    Activity Tolerance  Patient tolerated treatment well    Behavior During Therapy  [facility] for tasks assessed/performed       Past Medical History:  Diagnosis Date  . Arthritis   . Bilateral lower extremity edema   . Chronic venous insufficiency   . CKD (chronic kidney disease), stage III ([place])   . History of alcohol abuse    none since 2013 per pt  . History of chronic hepatitis    Genotype 1, FO/F1-- treated w/ harvoni (completed May 2016)  . History of external beam radiation therapy    2007 prostate  . History of intravenous drug use in remission    per pt in remission since 1980's  . HOH (hard of hearing)   . Hypertension   . Normocytic anemia   . Recurrent prostate cancer [facility]) urologist-  dr Junious Silk   dx 2007  s/p  external beam radioation therapy/ recurrent 12-2015  Gleason 4+4  . Type 2 diabetes, diet controlled ([place])    no meds since 2010 approx    Past Surgical History:  Procedure Laterality Date  . CATARACT EXTRACTION W/PHACO Left 12/14/2012   Procedure: CATARACT EXTRACTION PHACO AND INTRAOCULAR LENS PLACEMENT (IOC);  Surgeon: Tonny Branch, MD;  Location: [facility];  Service: Ophthalmology;  Laterality: Left;  CDE: 13.74  . CATARACT EXTRACTION W/PHACO Right 12/28/2012   Procedure: CATARACT EXTRACTION PHACO AND INTRAOCULAR LENS  PLACEMENT (IOC);  Surgeon: Tonny Branch, MD;  Location: [facility];  Service: Ophthalmology;  Laterality: Right;  CDE:12.80  . COLONOSCOPY N/A 02/14/2014   ZSM:OLMBEMLJ colonic polyps-removed as described above Colonic diverticulosis (tubular adenoma)  . CRYOABLATION N/A 05/24/2016   Procedure: CRYO ABLATION PROSTATE;  Surgeon: Festus Aloe, MD;  Location: [facility];  Service: Urology;  Laterality: N/A;  . ENDOVENOUS ABLATION SAPHENOUS VEIN W/ LASER  2013   Right leg X 2 by Dr. Debara Pickett  . ESOPHAGOGASTRODUODENOSCOPY N/A 02/14/2014   QGB:EEFEOF large pedunculated gastric polyp (hyperplastic)  . INCISION AND DRAINAGE OF PERITONSILLAR ABCESS  04/24/2017   Procedure: INCISION AND DRAINAGE OF PERINEUM ABCESS;  Surgeon: Franchot Gallo, MD;  Location: [place];  Service: Urology;;  . INSERTION OF SUPRAPUBIC CATHETER N/A 04/24/2017   Procedure: OPEN INSERTION OF SUPRAPUBIC CATHETER;  Surgeon: Franchot Gallo, MD;  Location: [place];  Service: Urology;  Laterality: N/A;  . LAPAROSCOPIC DIVERTED COLOSTOMY N/A 04/24/2017   Procedure: LAPAROSCOPIC SIGMOID COLOSTOMY;  Surgeon: Ileana Roup, MD;  Location: [place];  Service: General;  Laterality: N/A;  . REIMPLANTATION OF TOTAL KNEE Left 03/16/2010  . TONSILLECTOMY  child  . TOTAL KNEE  PROSTHESIS REMOVAL W/ SPACER INSERTION Left 12/29/2009  . TOTAL KNEE ARTHROPLASTY Bilateral  left 2007/   right 2005 approx    There were no vitals filed for this visit.              Wound Therapy - 08/04/17 0943    Subjective  Pt continues to have no pain.  He has not been contacted by his MD.    Patient and Family Stated Goals  wound to heal    Date of Onset  05/08/17    Prior Treatments  self care     Pain Assessment  No/denies pain    Evaluation and Treatment Procedures Explained to Patient/Family  Yes    Evaluation and Treatment Procedures  agreed to    Wound Properties Date First Assessed: 07/03/17 Time First Assessed: 0919 Wound  Type: Other (Comment) Location: Leg Location Orientation: Right;Lateral Wound Description (Comments): superior aspect hypergranulated  Present on Admission: Yes   Dressing Type  Compression wrap    Dressing Changed  Changed    Dressing Status  Old drainage    Dressing Change Frequency  PRN    Site / Wound Assessment  Clean;Friable;Pale    % Wound base Red or Granulating  95%    % Wound base Yellow/Fibrinous Exudate  5% Tissue that is outlying now has yellow exudate anteriorly    Peri-wound Assessment  Edema;Other (Comment) small red dots around wound; lower thigh red with edema     Wound Length (cm)  0.8 cm initally .9    Wound Width (cm)  0.8 cm initially .9    Wound Depth (cm)  -- Removal of compression wound almost flesh with skin     Wound Surface Area (cm^2)  0.64 cm^2    Drainage Amount  Copious    Drainage Description  Serous    Treatment  Cleansed;Other (Comment)    Wound Therapy - Clinical Statement  Called MD office again as pt has not heard anything from MD.  Still unable to get ahold of PA or MD.  Therapist left another message re concern that wound is hypergranuated, no pain and not improving.  Therapist feels wound should be biopsied.  Cultured taken today  in case MD  wants a culture.  Therapist feels that prothesis might be infected or possible cancer.  I have discharged the patient at this time due to lack of improvement.  This pt needs to be referred to a higher level of care.    Factors Delaying/Impairing Wound Healing  Multiple medical problems;Polypharmacy    Hydrotherapy Plan  Dressing change;Patient/family education;Other (comment)    Wound Therapy - Frequency  -- 1x a week    Wound Therapy - Current Recommendations  PT;Surgery consult surgical consult for biopsy     Wound Plan  Pt will be discharged back to prrimary MD next visit as we are not helping this gentleman therefore he needs to be referred to the next level.      Dressing   , silverhydrofiber, 4x4 and medipore  tape.                PT Short Term Goals - 08/04/17 1045      PT SHORT TERM GOAL #1   Title  Wound to have no hypergranulation     Time  3    Period  Weeks    Status  Partially Met      PT SHORT TERM GOAL #2   Title  PT to understand the need to wear compression on his LE to decrease edema.  Time  2    Period  Weeks    Status  Achieved      PT SHORT TERM GOAL #3   Title  PT to have had wound biopsied     Time  2    Period  Weeks    Status  On-going        PT Long Term Goals - 08/04/17 1046      PT LONG TERM GOAL #1   Title  Wound to be no greater than .3x.3 with no elevation to allow pt to feel comfortable in self care.     Time  4    Period  Weeks    Status  Not Met      PT LONG TERM GOAL #2   Title  Wound to have scant drainage to reduce risk of infection     Time  4    Period  Weeks    Status  Not Met      PT LONG TERM GOAL #3   Title  Pt to have obtained and to be able to don and doff compression garment.    Time  4    Period  Weeks    Status  Not Met            Plan - 08/04/17 1043    Clinical Impression Statement  Pt is not progressing.  This is the first time the granulating tissue inside the sound wound has been flesh with pt skin upon removal of the compression wrap, however, minutes after the removal therapist noted tissue rising from the wound opening.  Therapist explained to pt that she has not been able to help hiim with this problem and he needed to go to the next level of care.  Patient stated he agreed and understood. Therapist cultured drainage coming from wound and contacted MD office to see if they agreed and would write an order for a culture.     Rehab Potential  Good    PT Frequency  1x / week    PT Duration  4 weeks    PT Treatment/Interventions  ADLs/Self Care Home Management;Manual techniques;Manual lymph drainage;Compression bandaging;Other (comment) debridement if needed     PT Next Visit Plan  Discharge back to primary  MD for further follow up .        Patient will benefit from skilled therapeutic intervention in order to improve the following deficits and impairments:  Other (comment)(nonhealing wound)  Visit Diagnosis: Lymphedema, not elsewhere classified  Leg wound, right, sequela     Problem List Patient Active Problem List   Diagnosis Date Noted  . Recurrent falls 05/07/2017  . Full code status 05/07/2017  . Subdural hematoma ([place]) 05/06/2017  . Blood in stool   . Diarrhea in adult patient   . Heme positive stool   . Perineal abscess   . Recto-prostatic fistula   . MRSA bacteremia   . Staphylococcus aureus bacteremia with sepsis ([place])   . Severe sepsis with septic shock ([place])   . Acute renal failure ([place])   . Metabolic acidosis   . Melena   . Altered mental status 04/18/2017  . 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 Normocytic anemia 05/16/2014  . Colon cancer screening 01/14/2014  . Lymphedema 02/25/2013  . Leg edema, right 05/04/2012  . Varicose veins of lower extremities with other complications 37/62/8315  . Hepatitis C 08/28/2011  . HYPERTENSION 01/10/2010  . NEOPLASM, MALIGNANT, PROSTATE, HX OF 01/10/2010  .  DIABETES MELLITUS, TYPE II 01/04/2010  . 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   Rayetta Humphrey, PT CLT [phone] 08/04/2017, 10:48 AM  [place], [place], [address] Phone: [phone]   Fax:  [phone]  Name: Colin [name] MRN: [id number] Date of Birth: April 06, 1941  PHYSICAL THERAPY DISCHARGE SUMMARY  Visits from Start of Care: 6  Current functional level related to goals / functional outcomes: See above    Remaining deficits: Wound is still not healing; LE is edematous   Education / Equipment: That pt needs referral to surgeon for ? Biopsy/? Infected prothesis.  Plan: Patient agrees to discharge.  Patient goals were not met. Patient is being discharged due to lack of  progress.  ?????       Rayetta Humphrey, [place] CLT [phone]

## 2017-08-05 DIAGNOSIS — L929 Granulomatous disorder of the skin and subcutaneous tissue, unspecified: Secondary | ICD-10-CM | POA: Diagnosis not present

## 2017-08-13 DIAGNOSIS — Z22322 Carrier or suspected carrier of Methicillin resistant Staphylococcus aureus: Secondary | ICD-10-CM | POA: Diagnosis not present

## 2017-08-13 DIAGNOSIS — I872 Venous insufficiency (chronic) (peripheral): Secondary | ICD-10-CM | POA: Diagnosis not present

## 2017-09-02 ENCOUNTER — Ambulatory Visit: Payer: Medicare Other | Admitting: Urology

## 2017-09-09 ENCOUNTER — Other Ambulatory Visit: Payer: Self-pay | Admitting: Urology

## 2017-09-09 ENCOUNTER — Ambulatory Visit: Payer: Medicare Other | Admitting: Urology

## 2017-09-09 ENCOUNTER — Ambulatory Visit (HOSPITAL_COMMUNITY)
Admission: RE | Admit: 2017-09-09 | Discharge: 2017-09-09 | Disposition: A | Discharge status: Home / Self Care | Payer: Medicare Other | Source: Ambulatory Visit | Attending: Interventional Radiology | Admitting: Interventional Radiology

## 2017-09-09 ENCOUNTER — Ambulatory Visit (HOSPITAL_COMMUNITY)
Admission: RE | Admit: 2017-09-09 | Discharge: 2017-09-09 | Disposition: A | Discharge status: Home / Self Care | Payer: Medicare Other | Source: Ambulatory Visit | Attending: Urology | Admitting: Urology

## 2017-09-09 ENCOUNTER — Encounter (HOSPITAL_COMMUNITY): Payer: Self-pay | Admitting: Interventional Radiology

## 2017-09-09 DIAGNOSIS — C61 Malignant neoplasm of prostate: Secondary | ICD-10-CM

## 2017-09-09 DIAGNOSIS — N4289 Other specified disorders of prostate: Secondary | ICD-10-CM | POA: Diagnosis not present

## 2017-09-09 DIAGNOSIS — K604 Rectal fistula: Secondary | ICD-10-CM | POA: Diagnosis not present

## 2017-09-09 DIAGNOSIS — N322 Vesical fistula, not elsewhere classified: Secondary | ICD-10-CM | POA: Diagnosis not present

## 2017-09-09 HISTORY — PX: IR FLUORO RM 30-60 MIN: IMG2384

## 2017-09-09 MED ORDER — IOPAMIDOL (ISOVUE-300) INJECTION 61%
INTRAVENOUS | Status: AC
  Administered 2017-09-09: 30 mL
  Filled 2017-09-09: qty 50

## 2017-09-09 MED ORDER — FENTANYL CITRATE (PF) 100 MCG/2ML IJ SOLN
INTRAMUSCULAR | Status: AC
  Filled 2017-09-09: qty 2

## 2017-09-09 MED ORDER — LIDOCAINE VISCOUS 2 % MT SOLN
OROMUCOSAL | Status: AC
  Administered 2017-09-09: 1 mL
  Filled 2017-09-09: qty 15

## 2017-09-09 MED ORDER — MIDAZOLAM HCL 2 MG/2ML IJ SOLN
INTRAMUSCULAR | Status: AC
  Filled 2017-09-09: qty 2

## 2017-09-09 MED ORDER — LIDOCAINE HCL 1 % IJ SOLN
INTRAMUSCULAR | Status: AC
  Filled 2017-09-09: qty 20

## 2017-09-09 MED ORDER — FENTANYL CITRATE (PF) 100 MCG/2ML IJ SOLN
INTRAMUSCULAR | Status: AC | PRN
  Administered 2017-09-09 (×2): 50 ug via INTRAVENOUS

## 2017-09-09 MED ORDER — MIDAZOLAM HCL 2 MG/2ML IJ SOLN
INTRAMUSCULAR | Status: AC | PRN
  Administered 2017-09-09 (×3): 1 mg via INTRAVENOUS

## 2017-09-09 NOTE — Progress Notes (Signed)
Discharge instructions reviewed with pt and pt son, Marden Noble. Both parties verbalize understanding.

## 2017-09-09 NOTE — Sedation Documentation (Signed)
Patient is resting comfortably. 

## 2017-09-09 NOTE — Sedation Documentation (Signed)
Pt is resting comfortably at RN station. Son is at bedside, vitals stable

## 2017-09-09 NOTE — Procedures (Signed)
  Procedure: CT suprapubic catheter 67F EBL:   minimal Complications:  none immediate  See full dictation in [employer].  Dillard Cannon MD Main # [phone] Pager  [phone]

## 2017-09-09 NOTE — Sedation Documentation (Signed)
Pt vitals stable at this time. Discharge instructions given, pt and son verbalized understanding.

## 2017-09-09 NOTE — Discharge Instructions (Addendum)
Moderate Conscious Sedation, Adult, Care After These instructions provide you with information about caring for yourself after your procedure. Your health care provider may also give you more specific instructions. Your treatment has been planned according to current medical practices, but problems sometimes occur. Call your health care provider if you have any problems or questions after your procedure. What can I expect after the procedure? After your procedure, it is common:  To feel sleepy for several hours.  To feel clumsy and have poor balance for several hours.  To have poor judgment for several hours.  To vomit if you eat too soon.  Follow these instructions at home: For at least 24 hours after the procedure:   Do not: ? Participate in activities where you could fall or become injured. ? Drive. ? Use heavy machinery. ? Drink alcohol. ? Take sleeping pills or medicines that cause drowsiness. ? Make important decisions or sign legal documents. ? Take care of children on your own.  Rest. Eating and drinking  Follow the diet recommended by your health care provider.  If you vomit: ? Drink water, juice, or soup when you can drink without vomiting. ? Make sure you have little or no nausea before eating solid foods. General instructions  Have a responsible adult stay with you until you are awake and alert.  Take over-the-counter and prescription medicines only as told by your health care provider.  If you smoke, do not smoke without supervision.  Keep all follow-up visits as told by your health care provider. This is important. Contact a health care provider if:  You keep feeling nauseous or you keep vomiting.  You feel light-headed.  You develop a rash.  You have a fever. Get help right away if:  You have trouble breathing. This information is not intended to replace advice given to you by your health care provider. Make sure you discuss any questions you have  with your health care provider. Document Released: 04/14/2013 Document Revised: 11/27/2015 Document Reviewed: 10/14/2015 Elsevier Interactive Patient Education  2018 [place]. Suprapubic Catheter Replacement, Care After Refer to this sheet in the next few weeks. These instructions provide you with information about caring for yourself after your procedure. Your health care provider may also give you more specific instructions. Your treatment has been planned according to current medical practices, but problems sometimes occur. Call your health care provider if you have any problems or questions after your procedure. What can I expect after the procedure? After your procedure, it is possible to have some discomfort around the opening in your abdomen. Follow these instructions at home: Caring for your skin around the catheter Use a clean washcloth and soapy water to clean the skin around your catheter every day. Pat the area dry with a clean towel.  Do not pull on the catheter.  Do not use ointment or lotion on this area unless told by your health care provider.  Check your skin around the catheter every day for signs of infection. Check for: ? Redness, swelling, or pain. ? Fluid or blood. ? Warmth. ? Pus or a bad smell.  Caring for the catheter tube  Clean the catheter tube with soap and water as often as told by your health care provider.  Always make sure there are no twists or curls (kinks) in the catheter tube. Emptying the collection bag Empty the large collection bag every 8 hours. Empty the small collection bag when it is about ? full. To empty your  large or small collection bag, take the following steps:  Always keep the bag below the level of the catheter. This keeps urine from flowing backwards into the catheter.  Hold the bag over the toilet or another container. Turn the valve (spigot) at the bottom of the bag to empty the urine. ? Do not touch the opening of the  spigot. ? Do not let the opening touch the toilet or container.  Close the spigot tightly when the bag is empty.  Cleaning the collection bag  Clean the collection bag every 2-3 days, or as often as told by your health care provider. To do this, take the following steps:  Wash your hands with soap and water. If soap and water are not available, use hand sanitizer.  Disconnect the bag from the catheter and immediately attach a new bag to the catheter.  Empty the used bag completely.  Clean the used bag using one of the following methods: ? Rinse the bag with warm water and soap. ? Fill the bag with water and add 1 tsp of vinegar. Let it sit for about 30 minutes, then empty the bag.  Let the bag dry completely, and put it in a clean plastic bag before storing it.  General instructions  Always wash your hands before and after caring for your catheter and collection bag. Use a mild, fragrance-free soap. If soap and water are not available, use hand sanitizer.  Always make sure there are no leaks in the catheter or collection bag.  Drink enough fluid to keep your urine clear or pale yellow.  If you were prescribed an antibiotic medicine, take it as told by your health care provider. Do not stop taking the antibiotic even if you start to feel better.  Do not take baths, swim, or use a hot tub.  Keep all follow-up appointments as told by your health care provider. This is important. Contact a health care provider if:  You leak urine.  You have redness, swelling, or pain around your catheter opening.  You have fluid or blood coming from your catheter opening.  Your catheter opening feels warm to the touch.  You have pus or a bad smell coming from your catheter opening.  You have a fever or chills.  Your urine flow slows down.  Your urine becomes cloudy or smelly. Get help right away if:  Your catheter comes out.  You feel nauseous.  You have back pain.  You have  difficulty changing your catheter.  You have blood in your urine.  You have no urine flow for 1 hour. This information is not intended to replace advice given to you by your health care provider. Make sure you discuss any questions you have with your health care provider. Document Released: 03/12/2011 Document Revised: 02/21/2016 Document Reviewed: 03/07/2015 Elsevier Interactive Patient Education  2018 [employer].

## 2017-09-09 NOTE — Sedation Documentation (Signed)
Transferring pt to RN station for recovery, SS unable to take pt at this time.

## 2017-09-09 NOTE — Sedation Documentation (Signed)
Pt escorted to front entrance via wheelchair. Pt son will transport pt home.

## 2017-09-30 ENCOUNTER — Ambulatory Visit: Payer: Self-pay | Admitting: Urology

## 2017-12-30 ENCOUNTER — Ambulatory Visit: Payer: Medicare Other | Admitting: Urology

## 2017-12-30 ENCOUNTER — Other Ambulatory Visit: Payer: Self-pay | Admitting: Urology

## 2017-12-30 DIAGNOSIS — N35014 Post-traumatic urethral stricture, male, unspecified: Secondary | ICD-10-CM

## 2017-12-30 DIAGNOSIS — N360 Urethral fistula: Secondary | ICD-10-CM

## 2017-12-30 DIAGNOSIS — C61 Malignant neoplasm of prostate: Secondary | ICD-10-CM | POA: Diagnosis not present

## 2018-01-01 ENCOUNTER — Ambulatory Visit (HOSPITAL_COMMUNITY)
Admission: RE | Admit: 2018-01-01 | Discharge: 2018-01-01 | Disposition: A | Discharge status: Home / Self Care | Payer: Medicare Other | Source: Ambulatory Visit | Attending: Urology | Admitting: Urology

## 2018-01-01 ENCOUNTER — Encounter (HOSPITAL_COMMUNITY): Payer: Self-pay

## 2018-01-01 DIAGNOSIS — N183 Chronic kidney disease, stage 3 (moderate): Secondary | ICD-10-CM | POA: Diagnosis not present

## 2018-01-01 DIAGNOSIS — B1920 Unspecified viral hepatitis C without hepatic coma: Secondary | ICD-10-CM | POA: Diagnosis not present

## 2018-01-01 DIAGNOSIS — I129 Hypertensive chronic kidney disease with stage 1 through stage 4 chronic kidney disease, or unspecified chronic kidney disease: Secondary | ICD-10-CM | POA: Diagnosis not present

## 2018-01-01 DIAGNOSIS — E1122 Type 2 diabetes mellitus with diabetic chronic kidney disease: Secondary | ICD-10-CM | POA: Diagnosis not present

## 2018-01-01 DIAGNOSIS — Z923 Personal history of irradiation: Secondary | ICD-10-CM | POA: Diagnosis not present

## 2018-01-01 DIAGNOSIS — Z8546 Personal history of malignant neoplasm of prostate: Secondary | ICD-10-CM | POA: Insufficient documentation

## 2018-01-01 DIAGNOSIS — N35014 Post-traumatic urethral stricture, male, unspecified: Secondary | ICD-10-CM

## 2018-01-01 DIAGNOSIS — I872 Venous insufficiency (chronic) (peripheral): Secondary | ICD-10-CM | POA: Diagnosis not present

## 2018-01-01 DIAGNOSIS — Z436 Encounter for attention to other artificial openings of urinary tract: Secondary | ICD-10-CM | POA: Insufficient documentation

## 2018-01-01 DIAGNOSIS — Z435 Encounter for attention to cystostomy: Secondary | ICD-10-CM | POA: Diagnosis not present

## 2018-01-01 DIAGNOSIS — N320 Bladder-neck obstruction: Secondary | ICD-10-CM | POA: Diagnosis not present

## 2018-01-01 HISTORY — PX: IR CATHETER TUBE CHANGE: IMG717

## 2018-01-01 LAB — BASIC METABOLIC PANEL
Anion gap: 9 (ref 5–15)
BUN: 41 mg/dL — AB (ref 8–23)
CALCIUM: 8.9 mg/dL (ref 8.9–10.3)
CHLORIDE: 112 mmol/L — AB (ref 98–111)
CO2: 20 mmol/L — AB (ref 22–32)
CREATININE: 1.85 mg/dL — AB (ref 0.61–1.24)
GFR calc non Af Amer: 34 mL/min — ABNORMAL LOW (ref >60)
GFR, EST AFRICAN AMERICAN: 39 mL/min — AB (ref >60)
Glucose, Bld: 112 mg/dL — ABNORMAL HIGH (ref 70–99)
Potassium: 3.8 mmol/L (ref 3.5–5.1)
Sodium: 141 mmol/L (ref 135–145)

## 2018-01-01 LAB — CBC WITH DIFFERENTIAL/PLATELET
BASOS PCT: 0 %
Basophils Absolute: 0 10*3/uL (ref 0.0–0.1)
Eosinophils Absolute: 0.2 10*3/uL (ref 0.0–0.7)
Eosinophils Relative: 3 %
HEMATOCRIT: 37.3 % — AB (ref 39.0–52.0)
HEMOGLOBIN: 12.1 g/dL — AB (ref 13.0–17.0)
Lymphocytes Relative: 22 %
Lymphs Abs: 1.9 10*3/uL (ref 0.7–4.0)
MCH: 28.8 pg (ref 26.0–34.0)
MCHC: 32.4 g/dL (ref 30.0–36.0)
MCV: 88.8 fL (ref 78.0–100.0)
MONO ABS: 0.6 10*3/uL (ref 0.1–1.0)
MONOS PCT: 7 %
NEUTROS ABS: 5.8 10*3/uL (ref 1.7–7.7)
NEUTROS PCT: 68 %
Platelets: 347 10*3/uL (ref 150–400)
RBC: 4.2 MIL/uL — ABNORMAL LOW (ref 4.22–5.81)
RDW: 16.7 % — AB (ref 11.5–15.5)
WBC: 8.5 10*3/uL (ref 4.0–10.5)

## 2018-01-01 LAB — PROTIME-INR
INR: 1.06
Prothrombin Time: 13.7 seconds (ref 11.4–15.2)

## 2018-01-01 MED ORDER — IOPAMIDOL (ISOVUE-300) INJECTION 61%
INTRAVENOUS | Status: AC
  Filled 2018-01-01: qty 50

## 2018-01-01 MED ORDER — MIDAZOLAM HCL 2 MG/2ML IJ SOLN
INTRAMUSCULAR | Status: AC
  Filled 2018-01-01: qty 4

## 2018-01-01 MED ORDER — SODIUM CHLORIDE 0.9 % IV SOLN
INTRAVENOUS | Status: DC
  Administered 2018-01-01: 13:00:00 via INTRAVENOUS

## 2018-01-01 MED ORDER — IOPAMIDOL (ISOVUE-300) INJECTION 61%
15.0000 mL | Freq: Once | INTRAVENOUS | Status: AC | PRN
  Administered 2018-01-01: 15 mL

## 2018-01-01 MED ORDER — FENTANYL CITRATE (PF) 100 MCG/2ML IJ SOLN
INTRAMUSCULAR | Status: AC | PRN
  Administered 2018-01-01 (×3): 50 ug via INTRAVENOUS

## 2018-01-01 MED ORDER — LIDOCAINE HCL 1 % IJ SOLN
INTRAMUSCULAR | Status: AC
  Filled 2018-01-01: qty 20

## 2018-01-01 MED ORDER — NALOXONE HCL 0.4 MG/ML IJ SOLN
INTRAMUSCULAR | Status: AC
  Filled 2018-01-01: qty 1

## 2018-01-01 MED ORDER — MIDAZOLAM HCL 2 MG/2ML IJ SOLN
INTRAMUSCULAR | Status: AC | PRN
  Administered 2018-01-01 (×2): 1 mg via INTRAVENOUS
  Administered 2018-01-01 (×2): 0.5 mg via INTRAVENOUS

## 2018-01-01 MED ORDER — FLUMAZENIL 0.5 MG/5ML IV SOLN
INTRAVENOUS | Status: AC
  Filled 2018-01-01: qty 5

## 2018-01-01 MED ORDER — FENTANYL CITRATE (PF) 100 MCG/2ML IJ SOLN
INTRAMUSCULAR | Status: AC
  Filled 2018-01-01: qty 4

## 2018-01-01 MED ORDER — VANCOMYCIN HCL IN DEXTROSE 1-5 GM/200ML-% IV SOLN: 1000.0000 mg | INTRAVENOUS | Status: DC

## 2018-01-01 MED ORDER — VANCOMYCIN HCL IN DEXTROSE 1-5 GM/200ML-% IV SOLN
INTRAVENOUS | Status: AC
  Filled 2018-01-01: qty 200

## 2018-01-01 MED ORDER — LIDOCAINE VISCOUS HCL 2 % MT SOLN
OROMUCOSAL | Status: AC
  Administered 2018-01-01: 5 mL
  Filled 2018-01-01: qty 15

## 2018-01-01 NOTE — Discharge Instructions (Signed)
Suprapubic Catheter Replacement, Care After Refer to this sheet in the next few weeks. These instructions provide you with information about caring for yourself after your procedure. Your health care provider may also give you more specific instructions. Your treatment has been planned according to current medical practices, but problems sometimes occur. Call your health care provider if you have any problems or questions after your procedure. What can I expect after the procedure? After your procedure, it is possible to have some discomfort around the opening in your abdomen. Follow these instructions at home: Caring for your skin around the catheter Use a clean washcloth and soapy water to clean the skin around your catheter every day. Pat the area dry with a clean towel.  Do not pull on the catheter.  Do not use ointment or lotion on this area unless told by your health care provider.  Check your skin around the catheter every day for signs of infection. Check for: ? Redness, swelling, or pain. ? Fluid or blood. ? Warmth. ? Pus or a bad smell.  Caring for the catheter tube  Clean the catheter tube with soap and water as often as told by your health care provider.  Always make sure there are no twists or curls (kinks) in the catheter tube. Emptying the collection bag Empty the large collection bag every 8 hours. Empty the small collection bag when it is about ? full. To empty your large or small collection bag, take the following steps:  Always keep the bag below the level of the catheter. This keeps urine from flowing backwards into the catheter.  Hold the bag over the toilet or another container. Turn the valve (spigot) at the bottom of the bag to empty the urine. ? Do not touch the opening of the spigot. ? Do not let the opening touch the toilet or container.  Close the spigot tightly when the bag is empty.  Cleaning the collection bag  Clean the collection bag every 2-3  days, or as often as told by your health care provider. To do this, take the following steps:  Wash your hands with soap and water. If soap and water are not available, use hand sanitizer.  Disconnect the bag from the catheter and immediately attach a new bag to the catheter.  Empty the used bag completely.  Clean the used bag using one of the following methods: ? Rinse the bag with warm water and soap. ? Fill the bag with water and add 1 tsp of vinegar. Let it sit for about 30 minutes, then empty the bag.  Let the bag dry completely, and put it in a clean plastic bag before storing it.  General instructions  Always wash your hands before and after caring for your catheter and collection bag. Use a mild, fragrance-free soap. If soap and water are not available, use hand sanitizer.  Always make sure there are no leaks in the catheter or collection bag.  Drink enough fluid to keep your urine clear or pale yellow.  If you were prescribed an antibiotic medicine, take it as told by your health care provider. Do not stop taking the antibiotic even if you start to feel better.  Do not take baths, swim, or use a hot tub.  Keep all follow-up appointments as told by your health care provider. This is important. Contact a health care provider if:  You leak urine.  You have redness, swelling, or pain around your catheter opening.  You have  fluid or blood coming from your catheter opening.  Your catheter opening feels warm to the touch.  You have pus or a bad smell coming from your catheter opening.  You have a fever or chills.  Your urine flow slows down.  Your urine becomes cloudy or smelly. Get help right away if:  Your catheter comes out.  You feel nauseous.  You have back pain.  You have difficulty changing your catheter.  You have blood in your urine.  You have no urine flow for 1 hour. This information is not intended to replace advice given to you by your health  care provider. Make sure you discuss any questions you have with your health care provider. Document Released: 03/12/2011 Document Revised: 02/21/2016 Document Reviewed: 03/07/2015 Elsevier Interactive Patient Education  2018 [place].   Moderate Conscious Sedation, Adult, Care After These instructions provide you with information about caring for yourself after your procedure. Your health care provider may also give you more specific instructions. Your treatment has been planned according to current medical practices, but problems sometimes occur. Call your health care provider if you have any problems or questions after your procedure. What can I expect after the procedure? After your procedure, it is common:  To feel sleepy for several hours.  To feel clumsy and have poor balance for several hours.  To have poor judgment for several hours.  To vomit if you eat too soon.  Follow these instructions at home: For at least 24 hours after the procedure:   Do not: ? Participate in activities where you could fall or become injured. ? Drive. ? Use heavy machinery. ? Drink alcohol. ? Take sleeping pills or medicines that cause drowsiness. ? Make important decisions or sign legal documents. ? Take care of children on your own.  Rest. Eating and drinking  Follow the diet recommended by your health care provider.  If you vomit: ? Drink water, juice, or soup when you can drink without vomiting. ? Make sure you have little or no nausea before eating solid foods. General instructions  Have a responsible adult stay with you until you are awake and alert.  Take over-the-counter and prescription medicines only as told by your health care provider.  If you smoke, do not smoke without supervision.  Keep all follow-up visits as told by your health care provider. This is important. Contact a health care provider if:  You keep feeling nauseous or you keep vomiting.  You feel  light-headed.  You develop a rash.  You have a fever. Get help right away if:  You have trouble breathing. This information is not intended to replace advice given to you by your health care provider. Make sure you discuss any questions you have with your health care provider. Document Released: 04/14/2013 Document Revised: 11/27/2015 Document Reviewed: 10/14/2015 Elsevier Interactive Patient Education  [employer].

## 2018-01-01 NOTE — H&P (Signed)
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  He has a known history of prostate cancer in 2007, status post radiation therapy and cryoablation.  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  He presents again today for suprapubic catheter exchange and upsizing. He denies fever, headache, chest pain, dyspnea, cough, abdominal/back pain, nausea, vomiting or bleeding.  Additional medical history as below. Past Medical History:  Diagnosis Date  . Arthritis   . Bilateral lower extremity edema   . Chronic venous insufficiency   . CKD (chronic kidney disease), stage III ([place])   . History of alcohol abuse    none since 2013 per pt  . History of chronic hepatitis    Genotype 1, FO/F1-- treated w/ harvoni (completed May 2016)  . History of external beam radiation therapy    2007 prostate  . History of intravenous drug use in remission    per pt in remission since 1980's  . HOH (hard of hearing)   . Hypertension   . Normocytic anemia   . Recurrent prostate cancer [facility]) urologist-  dr Junious Silk   dx 2007  s/p  external beam radioation therapy/ recurrent 12-2015  Gleason 4+4  . Type 2 diabetes, diet controlled ([place])    no meds since 2010 approx   Past Surgical History:  Procedure Laterality Date  . CATARACT EXTRACTION W/PHACO Left 12/14/2012   Procedure: CATARACT EXTRACTION PHACO AND INTRAOCULAR LENS PLACEMENT (IOC);  Surgeon: Tonny Branch, MD;  Location: [facility];  Service: Ophthalmology;  Laterality: Left;  CDE: 13.74  . CATARACT  EXTRACTION W/PHACO Right 12/28/2012   Procedure: CATARACT EXTRACTION PHACO AND INTRAOCULAR LENS PLACEMENT (IOC);  Surgeon: Tonny Branch, MD;  Location: [facility];  Service: Ophthalmology;  Laterality: Right;  CDE:12.80  . COLONOSCOPY N/A 02/14/2014   CBJ:[id number] colonic polyps-removed as described above Colonic diverticulosis (tubular adenoma)  . CRYOABLATION N/A 05/24/2016   Procedure: CRYO ABLATION PROSTATE;  Surgeon: Festus Aloe, MD;  Location: [facility];  Service: Urology;  Laterality: N/A;  . ENDOVENOUS ABLATION SAPHENOUS VEIN W/ LASER  2013   Right leg X 2 by Dr. Debara Pickett  . ESOPHAGOGASTRODUODENOSCOPY N/A 02/14/2014   OHY:WVPXTG large pedunculated gastric polyp (hyperplastic)  . INCISION AND DRAINAGE OF PERITONSILLAR ABCESS  04/24/2017   Procedure: INCISION AND DRAINAGE OF PERINEUM ABCESS;  Surgeon: Franchot Gallo, MD;  Location: [place] [facility];  Service: Urology;;  . INSERTION OF SUPRAPUBIC CATHETER N/A 04/24/2017   Procedure: OPEN INSERTION OF SUPRAPUBIC CATHETER;  Surgeon: Franchot Gallo, MD;  Location: [place];  Service: Urology;  Laterality: N/A;  . IR FLUORO RM 30-60 MIN  09/09/2017  . LAPAROSCOPIC DIVERTED COLOSTOMY N/A 04/24/2017   Procedure: LAPAROSCOPIC SIGMOID COLOSTOMY;  Surgeon: Ileana Roup, MD;  Location: [place];  Service: General;  Laterality: N/A;  . REIMPLANTATION OF TOTAL KNEE Left 03/16/2010  . TONSILLECTOMY  child  . TOTAL KNEE  PROSTHESIS REMOVAL W/ SPACER INSERTION Left 12/29/2009  . TOTAL KNEE ARTHROPLASTY Bilateral left 2007/   right 2005 approx     Allergies: Patient has no known allergies.  Medications: Prior to Admission medications   Medication  Sig Start Date End Date Taking? Authorizing Provider  doxycycline (VIBRAMYCIN) 100 MG capsule Take 1 capsule by mouth daily. 06/24/17   [provider]  ferrous sulfate 325 (65 FE) MG tablet Take 325 mg by mouth 3 (three) times daily with meals.    [provider]  Multiple  Vitamins-Minerals (MULTIVITAMIN WITH MINERALS) tablet Take 1 tablet by mouth daily.      [provider]  naproxen sodium (ALEVE) 220 MG tablet Take 440-660 mg by mouth daily as needed (pain).    [provider]  tamsulosin (FLOMAX) 0.4 MG CAPS capsule Take 0.4 mg by mouth daily. 06/16/17   [provider]  torsemide (DEMADEX) 20 MG tablet Take 2 tablets by mouth 4 (four) times daily. 07/08/17   [provider]     Vital Signs: BP 122/68 (BP Location: Right Arm)   Pulse 61   Temp 97.6 F (36.4 C) (Oral)   Resp 16   SpO2 99%   Physical Exam awake, alert.  Chest clear to auscultation bilaterally.  Heart with regular rate and rhythm.  Abdomen soft, positive bowel sounds, intact colostomy, suprapubic catheter intact with small amount of drainage at insertion site.  Bilateral lower extremity edema noted with some mild erythema and small draining wound of right lower leg.  Imaging: No results found.  Labs:  CBC: Recent Labs    04/30/17 0317 05/01/17 0656 05/09/17 07/15/17 1124  WBC 11.5* 9.6 8.1 8.0  HGB 10.2* 9.6* 8.7* 10.4*  HCT 30.9* 29.4* 27* 33.4*  PLT 209 235 321 359    COAGS: Recent Labs    04/18/17 1909 04/23/17 0356 07/15/17 1124  INR 1.26 1.23 1.07    BMP: Recent Labs    04/29/17 0801 04/30/17 0317 05/01/17 0656 05/09/17 07/15/17 1124  NA 136 133* 134* 137 133*  K 3.7 4.4 4.2 5.5* 3.8  CL 110 104 103  --  96*  CO2 19* 22 20*  --  22  GLUCOSE 101* 99 119*  --  128*  BUN 13 11 13 14  55*  CALCIUM 6.9* 7.1* 7.2*  --  8.8*  CREATININE 1.38* 1.31* 1.38* 1.6* 1.61*  GFRNONAA 48* 52* 48*  --  40*  GFRAA 56* 60* 56*  --  47*    LIVER FUNCTION TESTS: Recent Labs    04/23/17 0356  04/28/17 0349 04/29/17 0801 04/30/17 0317 07/15/17 1124  BILITOT 0.7  --   --  0.7 1.0 0.5  AST 40  --   --  50* 62* 24  ALT 20  --   --  32 46 18  ALKPHOS 210*  --   --  252* 239* 128*  PROT 5.3*  --   --  4.7* 4.9* 8.2*  ALBUMIN 1.8*   <  > 1.2* 1.6* 1.6* 3.5   < > = values in this interval not displayed.    Assessment and Plan: Pt with known history of prostate cancer in 2007, status post radiation therapy and cryoablation.  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  His most recent suprapubic catheter was placed on 09/09/2017 following inadvertent removal.  He presents again today for suprapubic catheter exchange and upsizing.  Details/risks of procedure, including but not limited to, internal bleeding, infection, injury to adjacent structures discussed with patient with his understanding and consent.  LABS PENDING   Electronically  Signed: D. Rowe Tyreke, PA-C 01/01/2018, 12:20 PM   I spent a total of 20 minutes at the the patient's bedside AND on the patient's hospital floor or unit, greater than 50% of which was counseling/coordinating care for suprapubic catheter exchange/upsizing

## 2018-01-12 ENCOUNTER — Other Ambulatory Visit: Payer: Self-pay | Admitting: Urology

## 2018-01-12 DIAGNOSIS — N360 Urethral fistula: Secondary | ICD-10-CM

## 2018-01-12 DIAGNOSIS — N179 Acute kidney failure, unspecified: Secondary | ICD-10-CM

## 2018-01-16 ENCOUNTER — Ambulatory Visit (HOSPITAL_COMMUNITY): Payer: Medicare Other

## 2018-02-10 ENCOUNTER — Ambulatory Visit (INDEPENDENT_AMBULATORY_CARE_PROVIDER_SITE_OTHER): Payer: Medicare Other | Admitting: Urology

## 2018-02-10 DIAGNOSIS — C61 Malignant neoplasm of prostate: Secondary | ICD-10-CM

## 2018-04-07 ENCOUNTER — Ambulatory Visit: Payer: Medicare Other | Admitting: Urology

## 2018-04-07 DIAGNOSIS — N35012 Post-traumatic membranous urethral stricture: Secondary | ICD-10-CM | POA: Diagnosis not present

## 2018-04-07 DIAGNOSIS — C61 Malignant neoplasm of prostate: Secondary | ICD-10-CM | POA: Diagnosis not present

## 2018-05-12 DIAGNOSIS — Z23 Encounter for immunization: Secondary | ICD-10-CM | POA: Diagnosis not present

## 2018-05-12 DIAGNOSIS — Z0000 Encounter for general adult medical examination without abnormal findings: Secondary | ICD-10-CM | POA: Diagnosis not present

## 2018-05-12 DIAGNOSIS — S81801D Unspecified open wound, right lower leg, subsequent encounter: Secondary | ICD-10-CM | POA: Diagnosis not present

## 2018-05-12 DIAGNOSIS — E119 Type 2 diabetes mellitus without complications: Secondary | ICD-10-CM | POA: Diagnosis not present

## 2018-05-12 DIAGNOSIS — Z0001 Encounter for general adult medical examination with abnormal findings: Secondary | ICD-10-CM | POA: Diagnosis not present

## 2018-05-12 DIAGNOSIS — Z1389 Encounter for screening for other disorder: Secondary | ICD-10-CM | POA: Diagnosis not present

## 2018-05-12 DIAGNOSIS — L0291 Cutaneous abscess, unspecified: Secondary | ICD-10-CM | POA: Diagnosis not present

## 2018-06-09 ENCOUNTER — Ambulatory Visit (INDEPENDENT_AMBULATORY_CARE_PROVIDER_SITE_OTHER): Payer: Medicare Other | Admitting: Urology

## 2018-06-09 DIAGNOSIS — N35014 Post-traumatic urethral stricture, male, unspecified: Secondary | ICD-10-CM | POA: Diagnosis not present

## 2018-06-09 DIAGNOSIS — N35012 Post-traumatic membranous urethral stricture: Secondary | ICD-10-CM | POA: Diagnosis not present

## 2018-08-11 ENCOUNTER — Ambulatory Visit (INDEPENDENT_AMBULATORY_CARE_PROVIDER_SITE_OTHER): Payer: Medicare HMO | Admitting: Urology

## 2018-08-11 DIAGNOSIS — N35014 Post-traumatic urethral stricture, male, unspecified: Secondary | ICD-10-CM

## 2018-09-09 ENCOUNTER — Ambulatory Visit: Payer: Self-pay | Admitting: Urology

## 2018-11-14 IMAGING — DX DG CHEST 2V
2 series · 2 of 2 positions shown · non-contrast
Comparison: Chest x-ray of April 18, 2017

CLINICAL DATA: Nonhealing wound of the lateral aspect of the right
knee. The patient is undergoing evaluation at [REDACTED].

EXAM:
CHEST  2 VIEW

[chest pa]
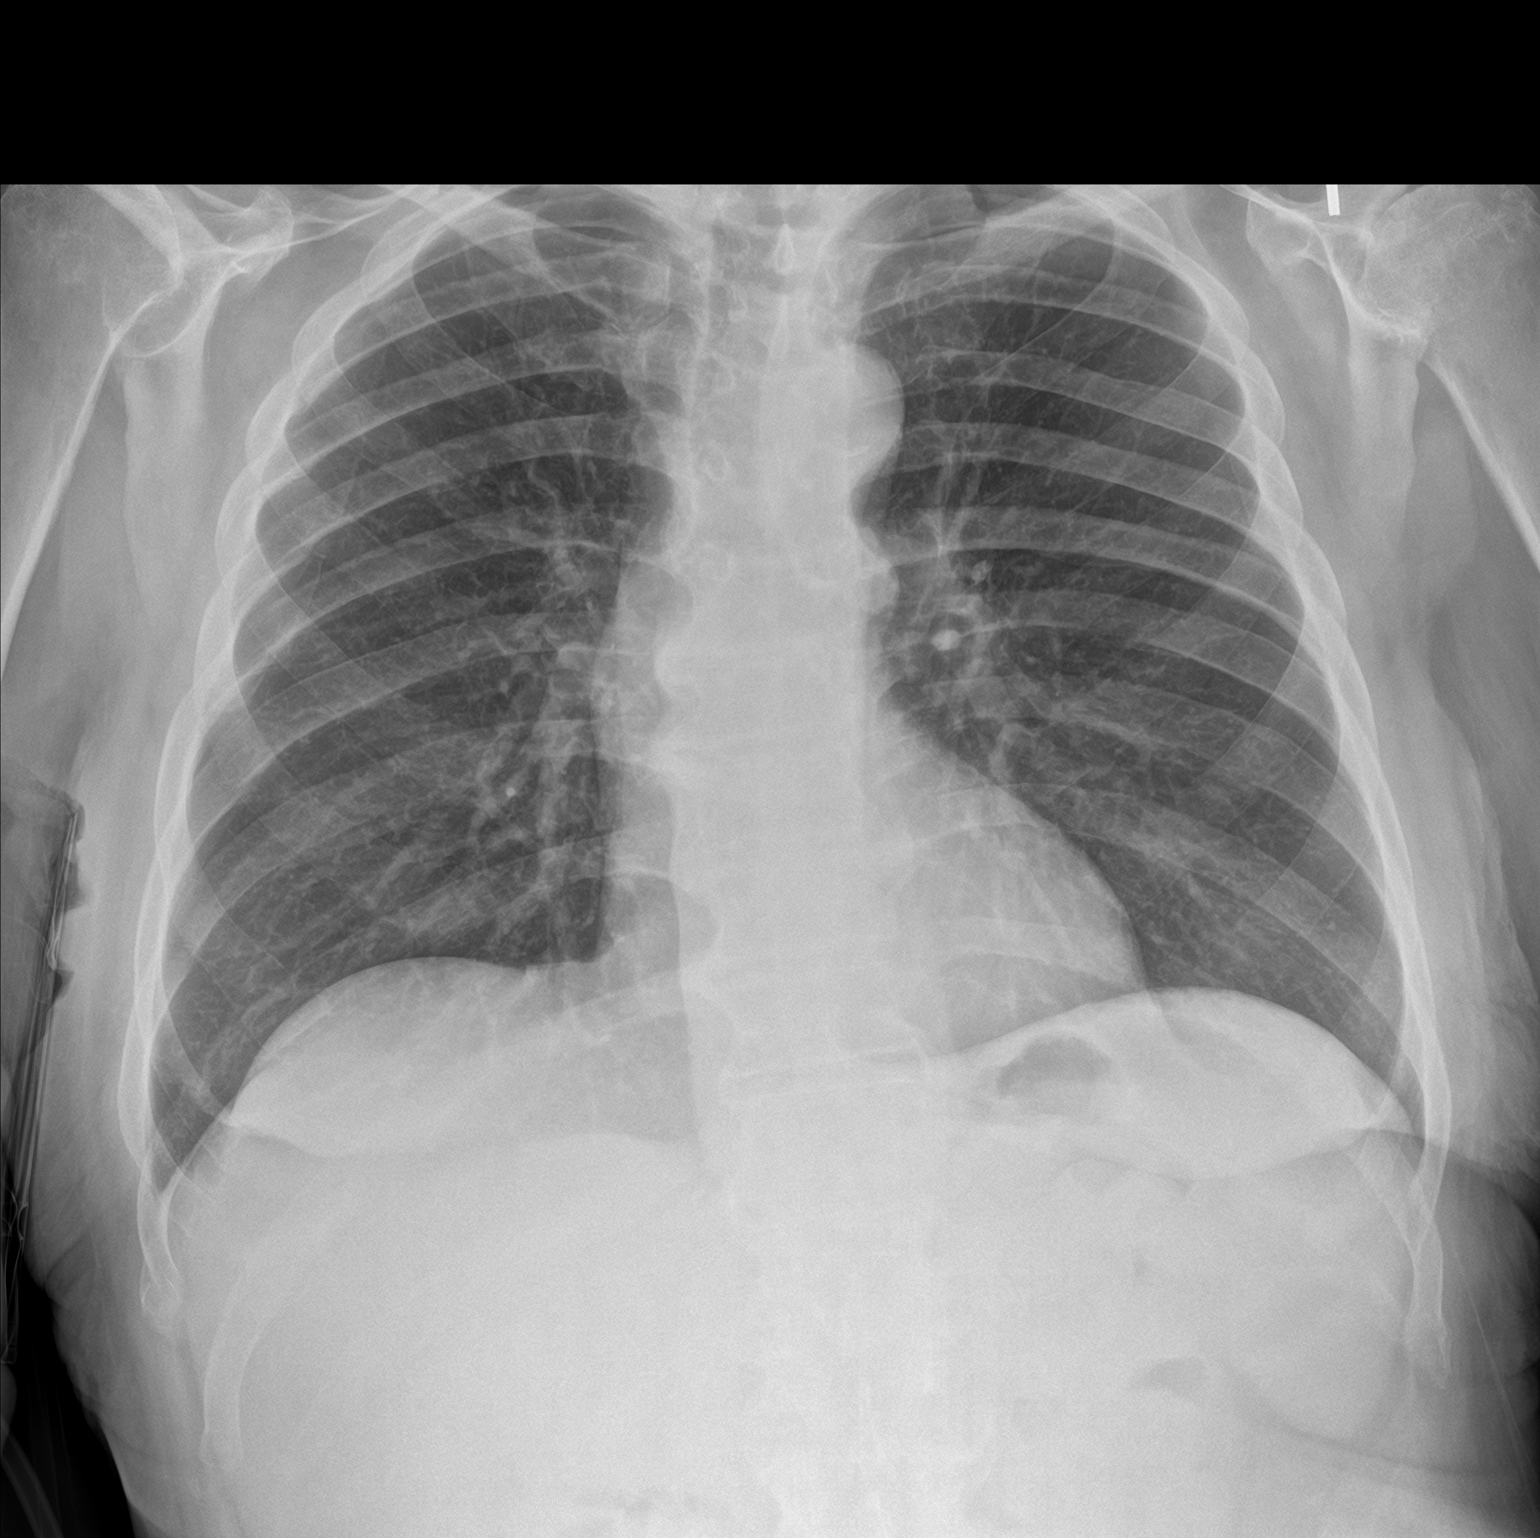

[chest lat]
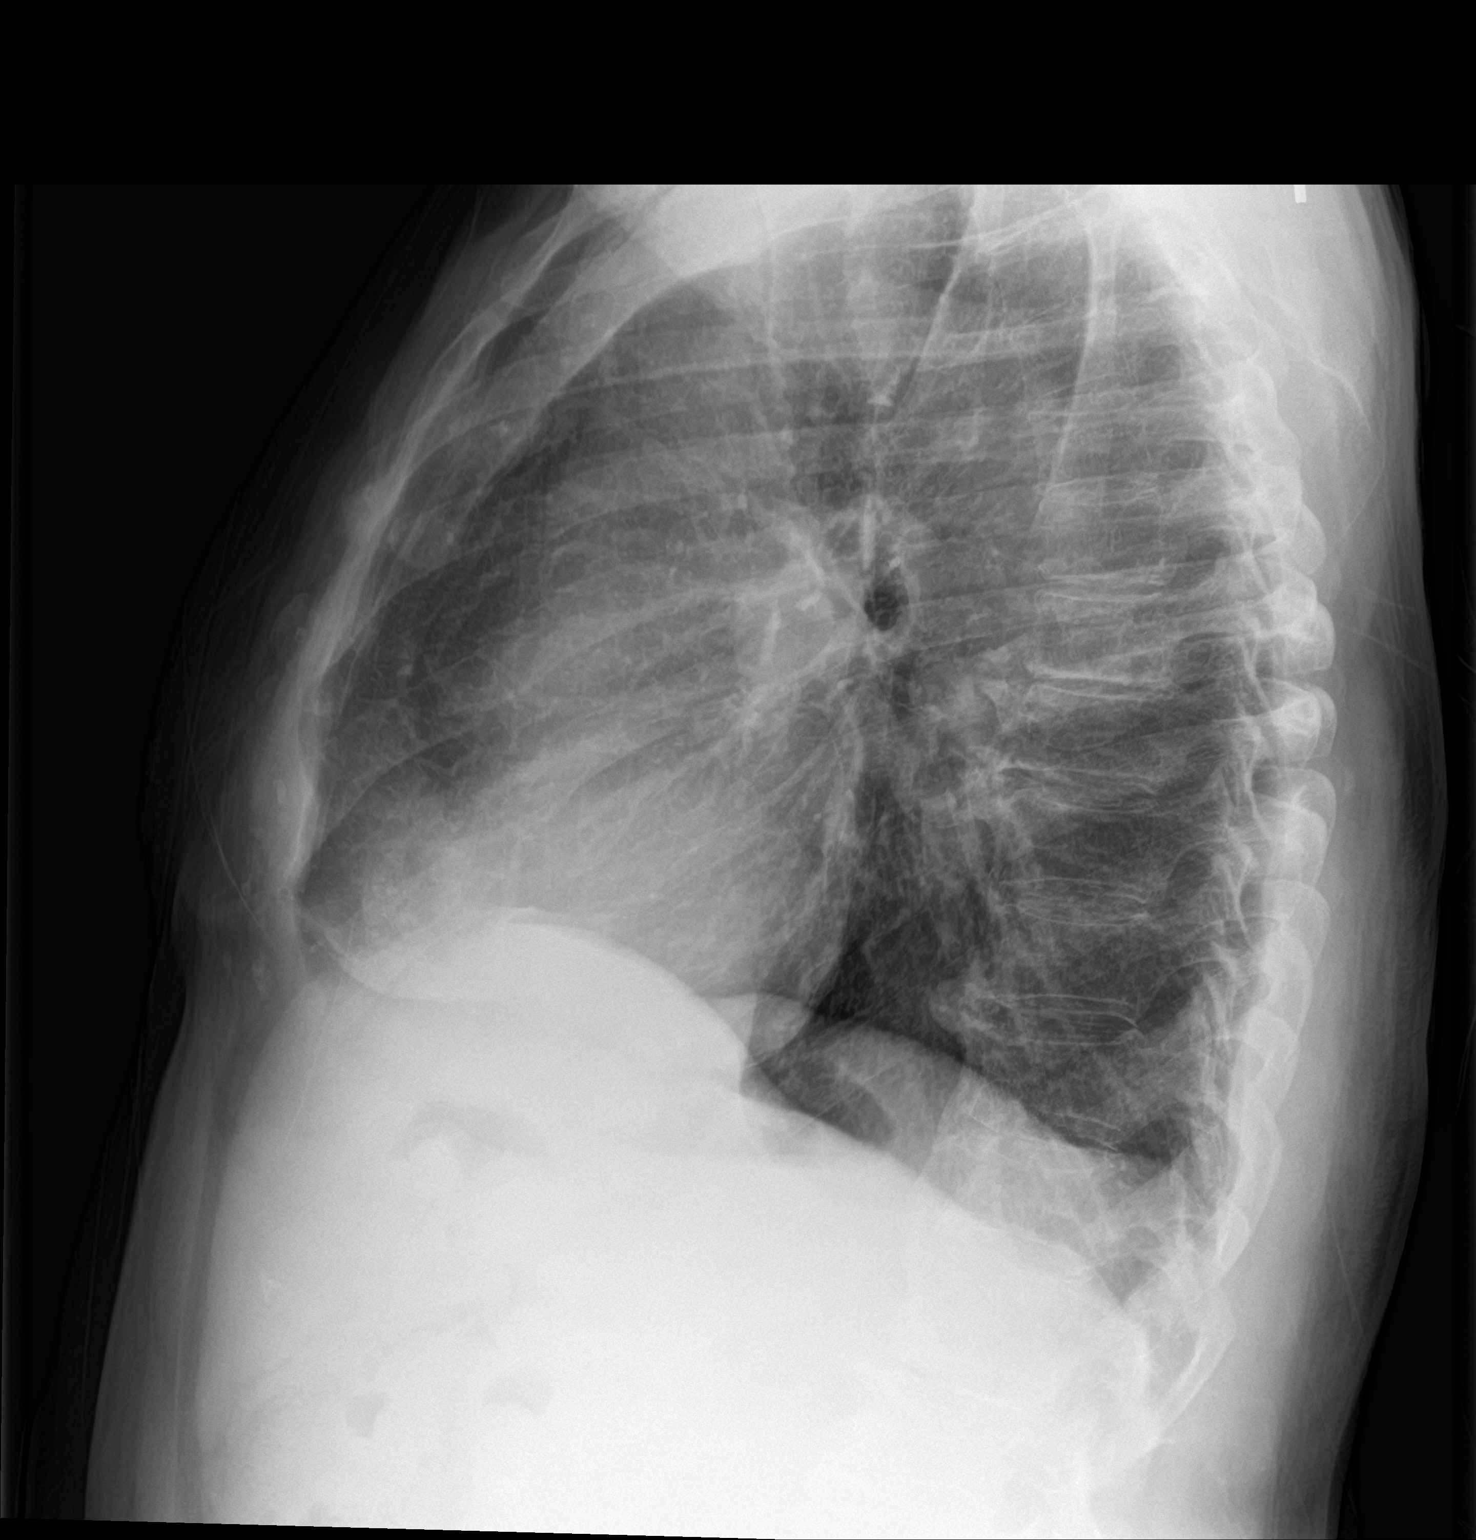

[2 of 2 positions shown; findings below may reference images not displayed]

FINDINGS: The lungs are well-expanded. There is no focal infiltrate. There is
no pleural effusion. The heart and pulmonary vascularity are normal.
There calcification in the wall of the aortic arch. There is mild
multilevel degenerative disc disease of the thoracic spine.
IMPRESSION: There is no active cardiopulmonary disease.

Thoracic aortic atherosclerosis.

## 2018-11-14 IMAGING — DX DG KNEE COMPLETE 4+V*R*
4 series · 4 of 4 positions shown · non-contrast
Comparison: Right knee radiograph from [facility] [HOSPITAL]
dated May 10, 2011

CLINICAL DATA: Nonhealing wound over the lateral aspect of the
right knee.

EXAM:
RIGHT KNEE - COMPLETE 4+ VIEW

[knee ap (1 of 3)]
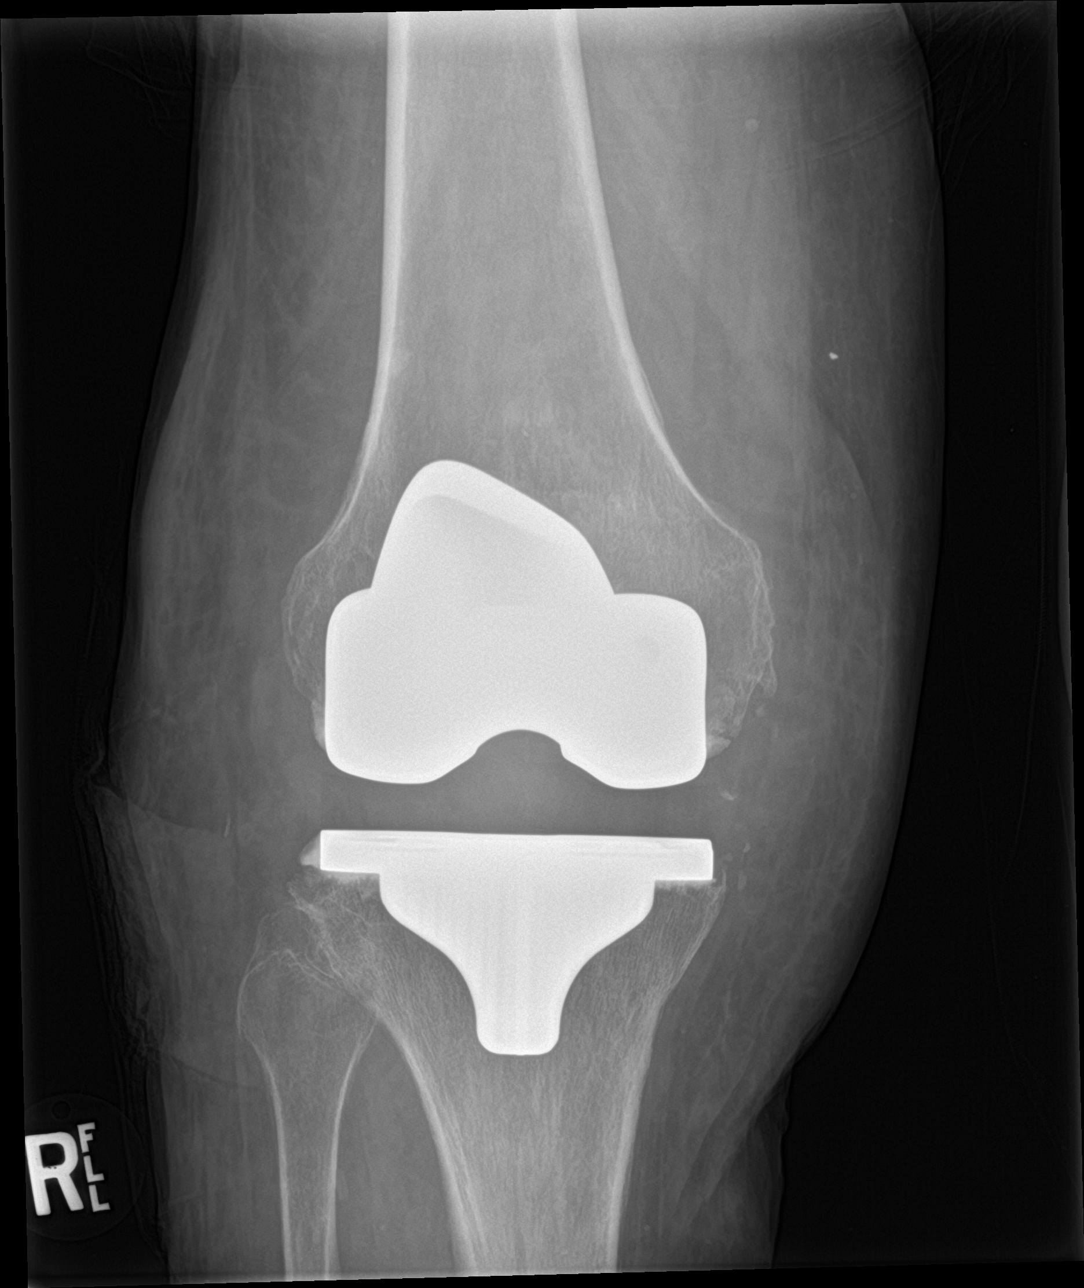

[knee ap (2 of 3)]
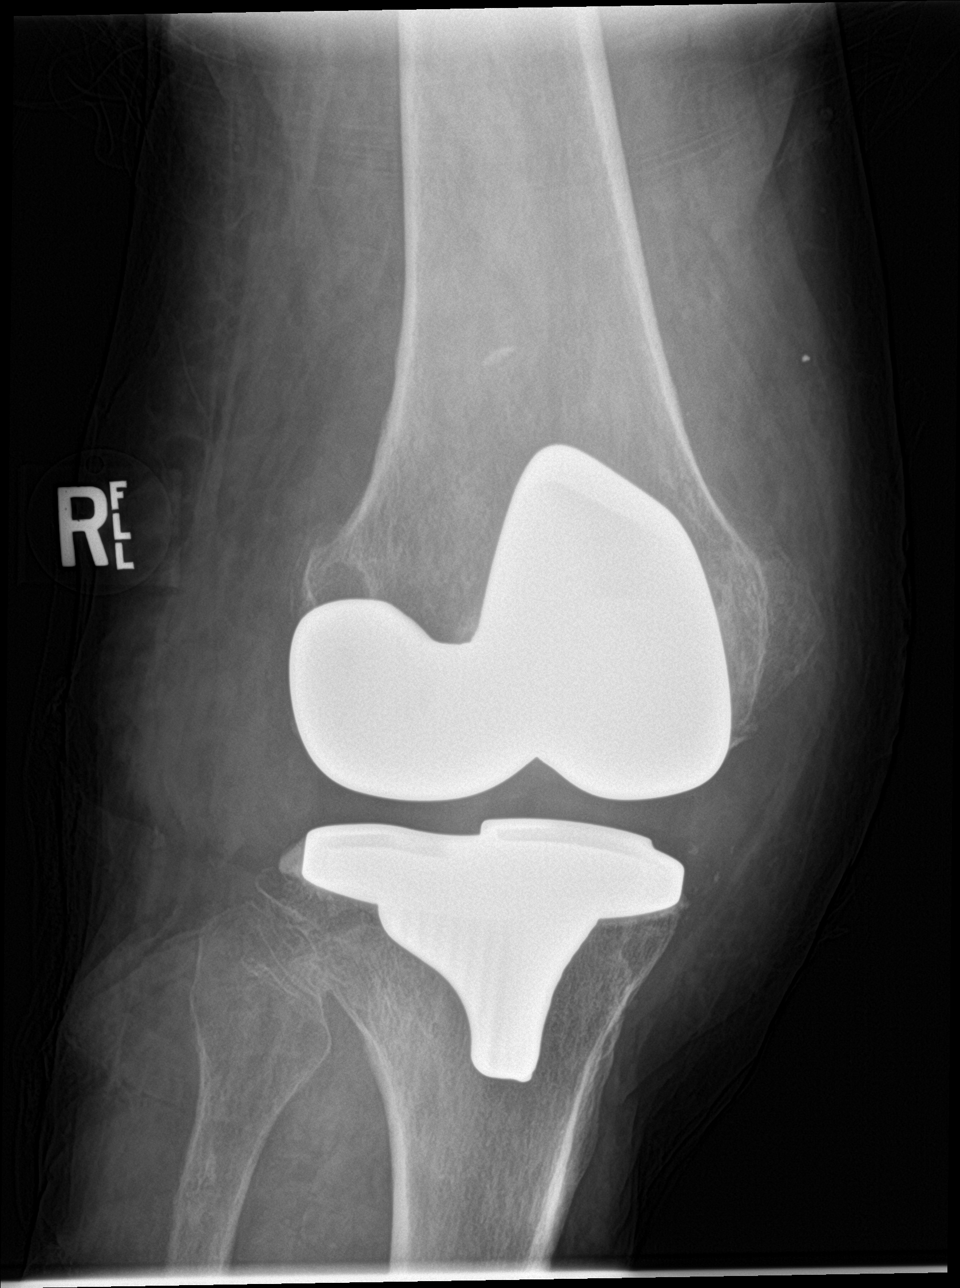

[knee ap (3 of 3)]
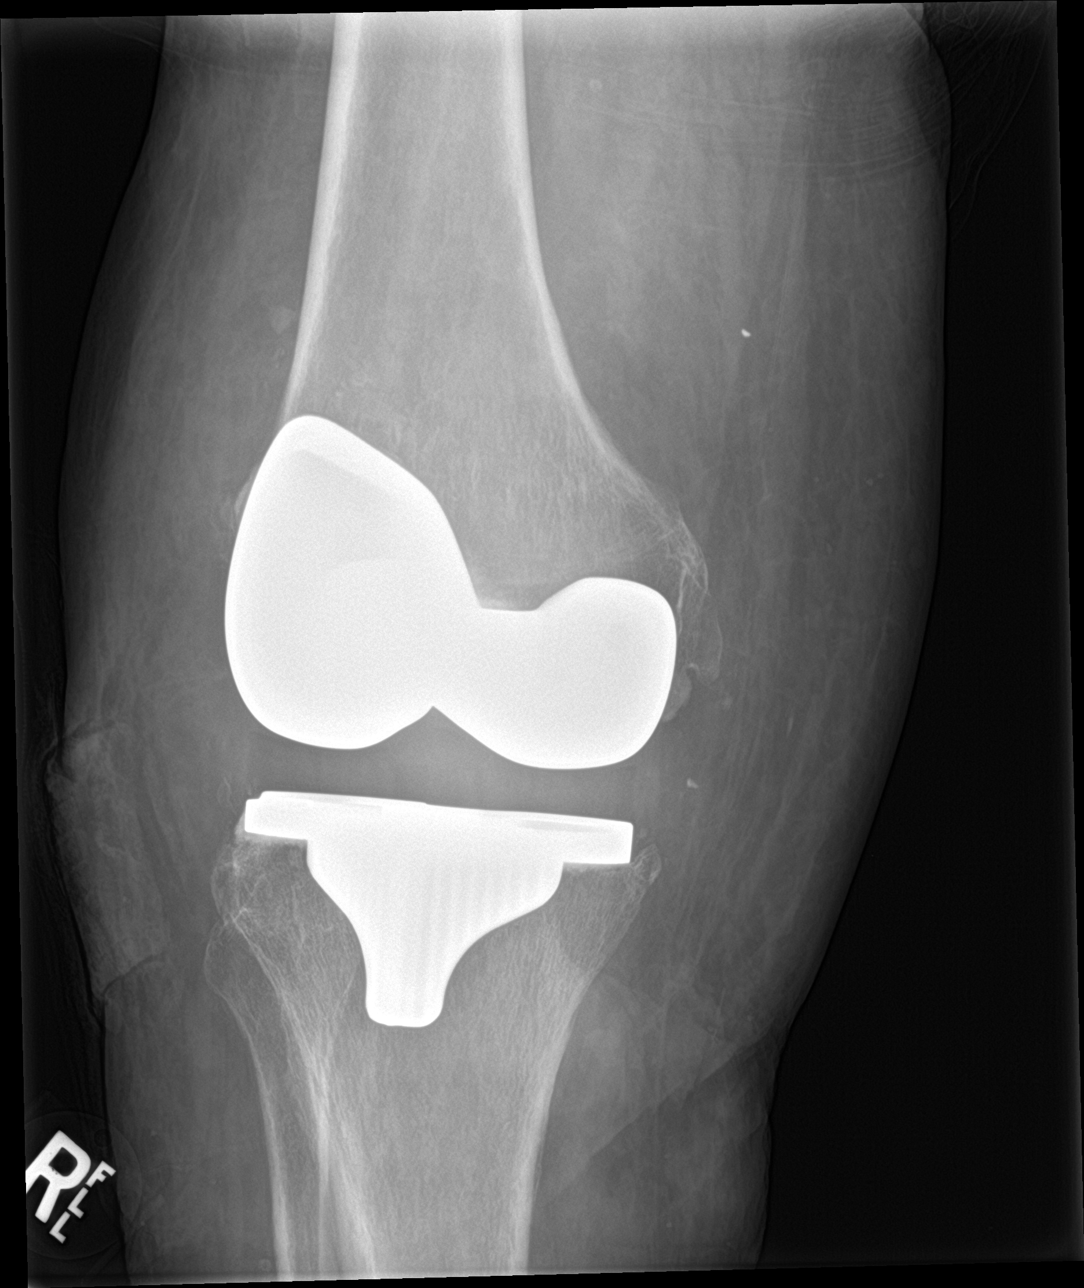

[knee lat]
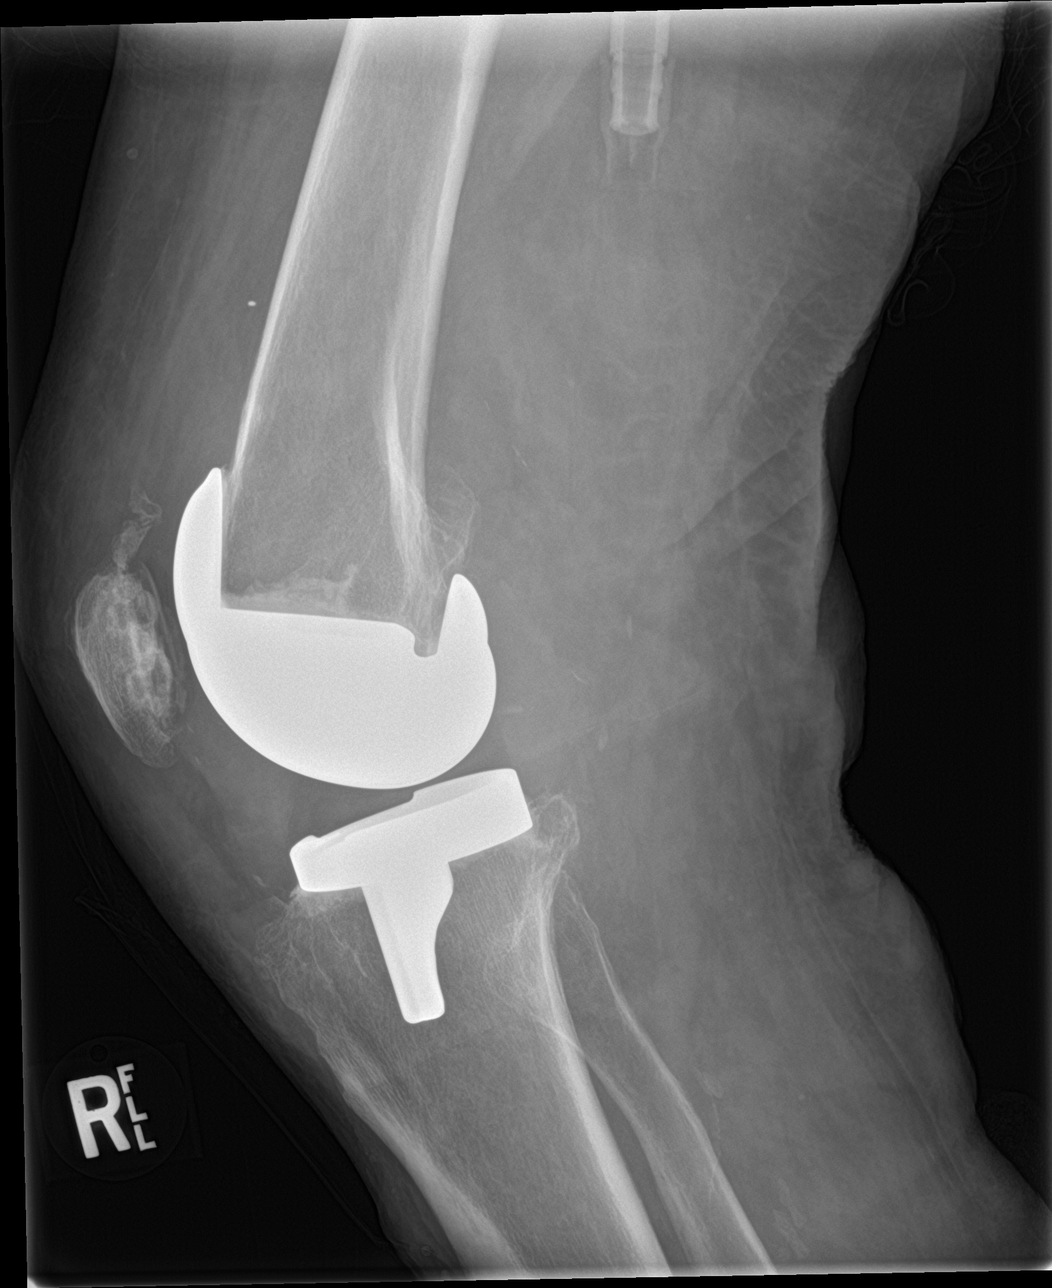

[4 of 4 positions shown; findings below may reference images not displayed]

FINDINGS: There is a right knee arthroplasty. Radiographic positioning of the
prosthetic components is good. The interface with the native bone
appears normal. No joint effusion is observed. There is bandage
material over the lateral aspect of the soft tissues at the level of
the knee joint and slightly inferior to this.
IMPRESSION: No objective evidence of osteomyelitis. No evidence of loosening of
the prosthesis. There is a cutaneous wound over the lateral aspect
of the knee.

## 2018-11-23 DIAGNOSIS — I10 Essential (primary) hypertension: Secondary | ICD-10-CM | POA: Diagnosis not present

## 2018-11-23 DIAGNOSIS — Z1389 Encounter for screening for other disorder: Secondary | ICD-10-CM | POA: Diagnosis not present

## 2018-11-23 DIAGNOSIS — Z681 Body mass index (BMI) 19 or less, adult: Secondary | ICD-10-CM | POA: Diagnosis not present

## 2018-11-23 DIAGNOSIS — Z0001 Encounter for general adult medical examination with abnormal findings: Secondary | ICD-10-CM | POA: Diagnosis not present

## 2018-11-23 DIAGNOSIS — G894 Chronic pain syndrome: Secondary | ICD-10-CM | POA: Diagnosis not present

## 2018-12-08 ENCOUNTER — Ambulatory Visit (INDEPENDENT_AMBULATORY_CARE_PROVIDER_SITE_OTHER): Payer: Medicare Other | Admitting: Urology

## 2018-12-08 DIAGNOSIS — Z8546 Personal history of malignant neoplasm of prostate: Secondary | ICD-10-CM

## 2018-12-08 DIAGNOSIS — N360 Urethral fistula: Secondary | ICD-10-CM

## 2018-12-10 DIAGNOSIS — C61 Malignant neoplasm of prostate: Secondary | ICD-10-CM | POA: Diagnosis not present

## 2019-01-09 IMAGING — XA IR FLUORO RM 0-60 MIN
1 series · 1 of 1 positions shown · non-contrast
Comparison: None.

INDICATION: Rectal-prostatic fistula with previous diversion using suprapubic
catheter. The catheter was inadvertently removed 1-4 days ago.

EXAM:
SUPRAPUBIC CATHETER PLACEMENT (ATTEMPTED)

[Series 2: fl (-) angio · 1 of 1 slices shown]
[im 1/1]
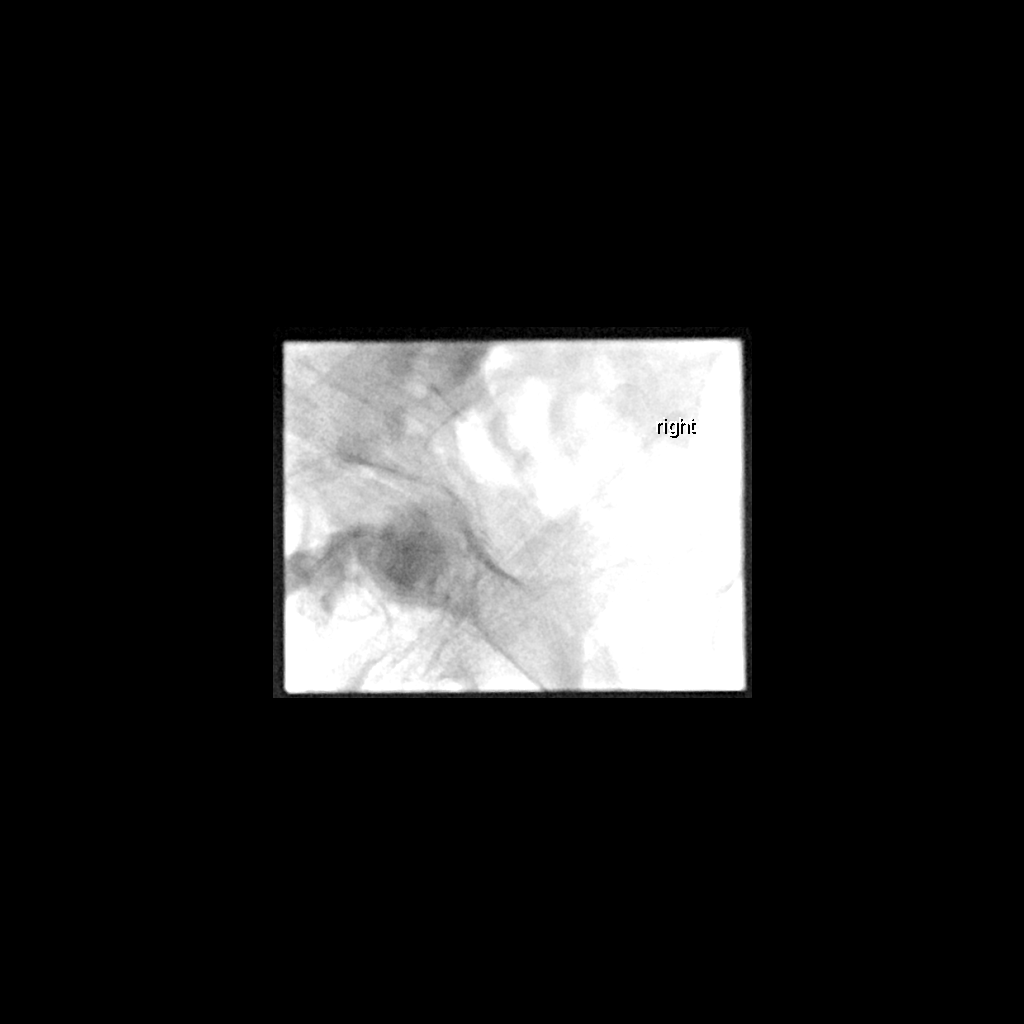

[1 of 1 positions shown; findings below may reference images not displayed]

MEDICATIONS:
None indicated

ANESTHESIA/SEDATION:
Topical viscous lidocaine

CONTRAST:  Isovue-administered into the collecting system(s)

FLUOROSCOPY TIME:  5.5 minutes; 663  uGymY DAP

COMPLICATIONS:
None immediate.

PROCEDURE:
Informed written consent was obtained from the patient after a
thorough discussion of the procedural risks, benefits and
alternatives. All questions were addressed. Maximal Sterile Barrier
Technique was utilized including caps, mask, sterile gowns, sterile
gloves, sterile drape, hand hygiene and skin antiseptic. A timeout
was performed prior to the initiation of the procedure.

Under fluoroscopy, an angled short Kumpe catheter was advanced
through the skin entry site of the previous suprapubic catheter in
attempts to cannulate the tract to the urinary bladder, using a
small amount of contrast for opacification. Ultimately, access to
the urinary bladder could not be achieved..
IMPRESSION: 1. Technically unsuccessful attempt at recanalizing the previous
suprapubic catheter tract to gain access to the urinary bladder.
2. Findings were telephoned to Dr. Hartmann. Suprapubic catheter
placement under CT will be arranged.

## 2019-01-22 ENCOUNTER — Ambulatory Visit: Payer: Medicare HMO | Admitting: Urology

## 2019-01-27 ENCOUNTER — Ambulatory Visit: Payer: Medicare HMO | Admitting: Urology

## 2019-02-10 ENCOUNTER — Observation Stay (HOSPITAL_COMMUNITY)
Admission: EM | Admit: 2019-02-10 | Discharge: 2019-02-12 | Disposition: A | Discharge status: Home / Self Care | Payer: Medicare HMO | Attending: Internal Medicine | Admitting: Internal Medicine

## 2019-02-10 ENCOUNTER — Emergency Department (HOSPITAL_COMMUNITY): Payer: Medicare HMO

## 2019-02-10 ENCOUNTER — Encounter (HOSPITAL_COMMUNITY): Payer: Self-pay | Admitting: Emergency Medicine

## 2019-02-10 ENCOUNTER — Other Ambulatory Visit: Payer: Self-pay

## 2019-02-10 DIAGNOSIS — K435 Parastomal hernia without obstruction or  gangrene: Secondary | ICD-10-CM | POA: Diagnosis not present

## 2019-02-10 DIAGNOSIS — K56699 Other intestinal obstruction unspecified as to partial versus complete obstruction: Principal | ICD-10-CM | POA: Insufficient documentation

## 2019-02-10 DIAGNOSIS — K56609 Unspecified intestinal obstruction, unspecified as to partial versus complete obstruction: Secondary | ICD-10-CM | POA: Diagnosis present

## 2019-02-10 DIAGNOSIS — I129 Hypertensive chronic kidney disease with stage 1 through stage 4 chronic kidney disease, or unspecified chronic kidney disease: Secondary | ICD-10-CM | POA: Insufficient documentation

## 2019-02-10 DIAGNOSIS — Z96653 Presence of artificial knee joint, bilateral: Secondary | ICD-10-CM | POA: Diagnosis not present

## 2019-02-10 DIAGNOSIS — Z03818 Encounter for observation for suspected exposure to other biological agents ruled out: Secondary | ICD-10-CM | POA: Diagnosis not present

## 2019-02-10 DIAGNOSIS — Z87891 Personal history of nicotine dependence: Secondary | ICD-10-CM | POA: Diagnosis not present

## 2019-02-10 DIAGNOSIS — N400 Enlarged prostate without lower urinary tract symptoms: Secondary | ICD-10-CM | POA: Diagnosis not present

## 2019-02-10 DIAGNOSIS — Z79899 Other long term (current) drug therapy: Secondary | ICD-10-CM | POA: Insufficient documentation

## 2019-02-10 DIAGNOSIS — N200 Calculus of kidney: Secondary | ICD-10-CM | POA: Diagnosis not present

## 2019-02-10 DIAGNOSIS — R112 Nausea with vomiting, unspecified: Secondary | ICD-10-CM | POA: Diagnosis not present

## 2019-02-10 DIAGNOSIS — N179 Acute kidney failure, unspecified: Secondary | ICD-10-CM | POA: Diagnosis not present

## 2019-02-10 DIAGNOSIS — R1110 Vomiting, unspecified: Secondary | ICD-10-CM | POA: Diagnosis not present

## 2019-02-10 DIAGNOSIS — K8020 Calculus of gallbladder without cholecystitis without obstruction: Secondary | ICD-10-CM | POA: Diagnosis not present

## 2019-02-10 DIAGNOSIS — N183 Chronic kidney disease, stage 3 (moderate): Secondary | ICD-10-CM | POA: Diagnosis not present

## 2019-02-10 DIAGNOSIS — Z8546 Personal history of malignant neoplasm of prostate: Secondary | ICD-10-CM | POA: Insufficient documentation

## 2019-02-10 DIAGNOSIS — R1084 Generalized abdominal pain: Secondary | ICD-10-CM | POA: Diagnosis not present

## 2019-02-10 DIAGNOSIS — N281 Cyst of kidney, acquired: Secondary | ICD-10-CM | POA: Diagnosis not present

## 2019-02-10 DIAGNOSIS — R109 Unspecified abdominal pain: Secondary | ICD-10-CM | POA: Diagnosis present

## 2019-02-10 DIAGNOSIS — I10 Essential (primary) hypertension: Secondary | ICD-10-CM

## 2019-02-10 DIAGNOSIS — Z20828 Contact with and (suspected) exposure to other viral communicable diseases: Secondary | ICD-10-CM | POA: Diagnosis not present

## 2019-02-10 DIAGNOSIS — E1122 Type 2 diabetes mellitus with diabetic chronic kidney disease: Secondary | ICD-10-CM | POA: Diagnosis not present

## 2019-02-10 LAB — COMPREHENSIVE METABOLIC PANEL
ALT: 21 U/L (ref 0–44)
AST: 18 U/L (ref 15–41)
Albumin: 3.1 g/dL — ABNORMAL LOW (ref 3.5–5.0)
Alkaline Phosphatase: 145 U/L — ABNORMAL HIGH (ref 38–126)
Anion gap: 12 (ref 5–15)
BUN: 25 mg/dL — ABNORMAL HIGH (ref 8–23)
CO2: 17 mmol/L — ABNORMAL LOW (ref 22–32)
Calcium: 8.6 mg/dL — ABNORMAL LOW (ref 8.9–10.3)
Chloride: 108 mmol/L (ref 98–111)
Creatinine, Ser: 1.62 mg/dL — ABNORMAL HIGH (ref 0.61–1.24)
GFR calc Af Amer: 47 mL/min — ABNORMAL LOW (ref >60)
GFR calc non Af Amer: 40 mL/min — ABNORMAL LOW (ref >60)
Glucose, Bld: 308 mg/dL — ABNORMAL HIGH (ref 70–99)
Potassium: 4.1 mmol/L (ref 3.5–5.1)
Sodium: 137 mmol/L (ref 135–145)
Total Bilirubin: 0.8 mg/dL (ref 0.3–1.2)
Total Protein: 7 g/dL (ref 6.5–8.1)

## 2019-02-10 LAB — LACTIC ACID, PLASMA
Lactic Acid, Venous: 2 mmol/L (ref 0.5–1.9)
Lactic Acid, Venous: 1.7 mmol/L (ref 0.5–1.9)

## 2019-02-10 LAB — CBC WITH DIFFERENTIAL/PLATELET
Abs Immature Granulocytes: 0.04 10*3/uL (ref 0.00–0.07)
Basophils Absolute: 0 10*3/uL (ref 0.0–0.1)
Basophils Relative: 0 %
Eosinophils Absolute: 0 10*3/uL (ref 0.0–0.5)
Eosinophils Relative: 0 %
HCT: 36.8 % — ABNORMAL LOW (ref 39.0–52.0)
Hemoglobin: 11.6 g/dL — ABNORMAL LOW (ref 13.0–17.0)
Immature Granulocytes: 0 %
Lymphocytes Relative: 10 %
Lymphs Abs: 1.1 10*3/uL (ref 0.7–4.0)
MCH: 27.5 pg (ref 26.0–34.0)
MCHC: 31.5 g/dL (ref 30.0–36.0)
MCV: 87.2 fL (ref 80.0–100.0)
Monocytes Absolute: 0.6 10*3/uL (ref 0.1–1.0)
Monocytes Relative: 6 %
Neutro Abs: 9.2 10*3/uL — ABNORMAL HIGH (ref 1.7–7.7)
Neutrophils Relative %: 84 %
Platelets: 309 10*3/uL (ref 150–400)
RBC: 4.22 MIL/uL (ref 4.22–5.81)
RDW: 14.9 % (ref 11.5–15.5)
WBC: 11 10*3/uL — ABNORMAL HIGH (ref 4.0–10.5)
nRBC: 0 % (ref 0.0–0.2)

## 2019-02-10 LAB — URINALYSIS, ROUTINE W REFLEX MICROSCOPIC
Bilirubin Urine: NEGATIVE
Glucose, UA: NEGATIVE mg/dL
Ketones, ur: NEGATIVE mg/dL
Nitrite: NEGATIVE
Protein, ur: 100 mg/dL — AB
Specific Gravity, Urine: 1.011 (ref 1.005–1.030)
pH: 9 — ABNORMAL HIGH (ref 5.0–8.0)

## 2019-02-10 LAB — SARS CORONAVIRUS 2 BY RT PCR (HOSPITAL ORDER, PERFORMED IN ~~LOC~~ HOSPITAL LAB): SARS Coronavirus 2: NEGATIVE

## 2019-02-10 LAB — PROTIME-INR
INR: 1.1 (ref 0.8–1.2)
Prothrombin Time: 14.5 seconds (ref 11.4–15.2)

## 2019-02-10 LAB — LIPASE, BLOOD: Lipase: 39 U/L (ref 11–51)

## 2019-02-10 LAB — TROPONIN I (HIGH SENSITIVITY)
Troponin I (High Sensitivity): 7 ng/L (ref <18)
Troponin I (High Sensitivity): 7 ng/L (ref <18)

## 2019-02-10 LAB — GLUCOSE, CAPILLARY: Glucose-Capillary: 289 mg/dL — ABNORMAL HIGH (ref 70–99)

## 2019-02-10 MED ORDER — ACETAMINOPHEN 325 MG PO TABS: 650.0000 mg | ORAL_TABLET | Freq: Four times a day (QID) | ORAL | Status: DC | PRN

## 2019-02-10 MED ORDER — IOHEXOL 300 MG/ML  SOLN: 100.0000 mL | Freq: Once | INTRAMUSCULAR | Status: DC | PRN

## 2019-02-10 MED ORDER — SODIUM CHLORIDE 0.9 % IV SOLN
INTRAVENOUS | Status: DC
  Administered 2019-02-10: 16:00:00 via INTRAVENOUS

## 2019-02-10 MED ORDER — ACETAMINOPHEN 650 MG RE SUPP: 650.0000 mg | Freq: Four times a day (QID) | RECTAL | Status: DC | PRN

## 2019-02-10 MED ORDER — ONDANSETRON HCL 4 MG/2ML IJ SOLN: 4.0000 mg | Freq: Four times a day (QID) | INTRAMUSCULAR | Status: DC | PRN

## 2019-02-10 MED ORDER — SODIUM CHLORIDE 0.9 % IV SOLN
INTRAVENOUS | Status: DC
  Administered 2019-02-11: 07:00:00 via INTRAVENOUS

## 2019-02-10 MED ORDER — INSULIN ASPART 100 UNIT/ML ~~LOC~~ SOLN
0.0000 [IU] | Freq: Four times a day (QID) | SUBCUTANEOUS | Status: DC
  Administered 2019-02-10: 5 [IU] via SUBCUTANEOUS
  Administered 2019-02-11: 3 [IU] via SUBCUTANEOUS
  Administered 2019-02-11: 1 [IU] via SUBCUTANEOUS
  Administered 2019-02-12: 2 [IU] via SUBCUTANEOUS

## 2019-02-10 MED ORDER — ONDANSETRON HCL 4 MG PO TABS: 4.0000 mg | ORAL_TABLET | Freq: Four times a day (QID) | ORAL | Status: DC | PRN

## 2019-02-10 NOTE — Progress Notes (Signed)
[facility]  Full consult note to follow   Patient coming in with vomiting? And leaking colostomy. Had some distention per RN report and started having ostomy output that was copious. 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 Reviewed CT with Dr.Boles and there is some haziness of the mesentery but no wall thickening no pneumotosis, and patient's exam benign with soft abdomen. Clinincally he is making stool in the bag and he is no longer nausated. He wants to eat.   WBC 11, reassuring  Lactic was 2 but is now 1.7, again not high   Can have clear liquids. Needs hospitalist admission Will monitor abdominal exam Adv diet as tolerated   Discussed with son and discussed parastomal hernia, discussed he could start to develop SBO that require revision of the ostomy.   Curlene Labrum, MD [facility] [address] [place], [place] [address] [phone] (office)

## 2019-02-10 NOTE — ED Provider Notes (Signed)
[facility] EMERGENCY DEPARTMENT Provider Note   CSN: [id number] Arrival date & time: 02/10/19  0900     History   Chief Complaint Chief Complaint  Patient presents with  . Emesis    HPI Colin [name] is a 78 y.o. male.     The history is provided by the patient and a relative. The history is limited by the condition of the patient (poor historian).  Emesis   Pt was seen at 1055.  Per pt and his son:  Pt's son states pt called him yesterday c/o generalized abd pain and nausea. Unclear if he has been vomiting. Pt keeps repeating "just ask my son." and "my colostomy bag is leaking." Pt has a chronic non-healing wound on his right lower lateral leg that pt's son states "still isn't healed." Denies CP/SOB, no cough, no fevers, no black or blood in stools, no injury.    Past Medical History:  Diagnosis Date  . Arthritis   . Bilateral lower extremity edema   . Chronic venous insufficiency   . CKD (chronic kidney disease), stage III ([place])   . History of alcohol abuse    none since 2013 per pt  . History of chronic hepatitis    Genotype 1, FO/F1-- treated w/ harvoni (completed May 2016)  . History of external beam radiation therapy    2007 prostate  . History of intravenous drug use in remission    per pt in remission since 1980's  . HOH (hard of hearing)   . Hypertension   . Normocytic anemia   . Recurrent prostate cancer [facility]) urologist-  dr Junious Silk   dx 2007  s/p  external beam radioation therapy/ recurrent 12-2015  Gleason 4+4  . Type 2 diabetes, diet controlled ([place])    no meds since 2010 approx    Patient Active Problem List   Diagnosis Date Noted  . Recurrent falls 05/07/2017  . Full code status 05/07/2017  . Subdural hematoma ([place]) 05/06/2017  . Blood in stool   . Diarrhea in adult patient   . Heme positive stool   . Perineal abscess   . Recto-prostatic fistula   . MRSA bacteremia   . Staphylococcus aureus bacteremia with sepsis ([place])   . Severe sepsis  with septic shock ([place])   . Acute renal failure ([place])   . Metabolic acidosis   . Melena   . Altered mental status 04/18/2017  . 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 Normocytic anemia 05/16/2014  . Colon cancer screening 01/14/2014  . Lymphedema 02/25/2013  . Leg edema, right 05/04/2012  . Varicose veins of lower extremities with other complications 44/81/8563  . Hepatitis C 08/28/2011  . HYPERTENSION 01/10/2010  . NEOPLASM, MALIGNANT, PROSTATE, HX OF 01/10/2010  . DIABETES MELLITUS, TYPE II 01/04/2010  . 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    Past Surgical History:  Procedure Laterality Date  . CATARACT EXTRACTION W/PHACO Left 12/14/2012   Procedure: CATARACT EXTRACTION PHACO AND INTRAOCULAR LENS PLACEMENT (IOC);  Surgeon: Tonny Branch, MD;  Location: [facility];  Service: Ophthalmology;  Laterality: Left;  CDE: 13.74  . CATARACT EXTRACTION W/PHACO Right 12/28/2012   Procedure: CATARACT EXTRACTION PHACO AND INTRAOCULAR LENS PLACEMENT (IOC);  Surgeon: Tonny Branch, MD;  Location: [facility];  Service: Ophthalmology;  Laterality: Right;  CDE:12.80  . COLONOSCOPY N/A 02/14/2014   JSH:[id number] colonic polyps-removed as described above Colonic diverticulosis (tubular adenoma)  . CRYOABLATION N/A 05/24/2016   Procedure: CRYO ABLATION PROSTATE;  Surgeon: Festus Aloe, MD;  Location: [place];  Service: Urology;  Laterality: N/A;  . ENDOVENOUS ABLATION SAPHENOUS VEIN W/ LASER  2013   Right leg X 2 by Dr. Debara Pickett  . ESOPHAGOGASTRODUODENOSCOPY N/A 02/14/2014   RXV:[id number] large pedunculated gastric polyp (hyperplastic)  . INCISION AND DRAINAGE OF PERITONSILLAR ABCESS  04/24/2017   Procedure: INCISION AND DRAINAGE OF PERINEUM ABCESS;  Surgeon: Franchot Gallo, MD;  Location: [place];  Service: Urology;;  . INSERTION OF SUPRAPUBIC CATHETER N/A 04/24/2017   Procedure: OPEN INSERTION OF SUPRAPUBIC CATHETER;  Surgeon: Franchot Gallo, MD;  Location: [place];  Service: Urology;   Laterality: N/A;  . IR CATHETER TUBE CHANGE  01/01/2018  . IR FLUORO RM 30-60 MIN  09/09/2017  . LAPAROSCOPIC DIVERTED COLOSTOMY N/A 04/24/2017   Procedure: LAPAROSCOPIC SIGMOID COLOSTOMY;  Surgeon: Ileana Roup, MD;  Location: [place];  Service: General;  Laterality: N/A;  . REIMPLANTATION OF TOTAL KNEE Left 03/16/2010  . TONSILLECTOMY  child  . TOTAL KNEE  PROSTHESIS REMOVAL W/ SPACER INSERTION Left 12/29/2009  . TOTAL KNEE ARTHROPLASTY Bilateral left 2007/   right 2005 approx        Home Medications    Prior to Admission medications   Medication Sig Start Date End Date Taking? Authorizing Provider  doxycycline (VIBRAMYCIN) 100 MG capsule Take 1 capsule by mouth daily. 06/24/17   [provider]  ferrous sulfate 325 (65 FE) MG tablet Take 325 mg by mouth 3 (three) times daily with meals.    [provider]  Multiple Vitamins-Minerals (MULTIVITAMIN WITH MINERALS) tablet Take 1 tablet by mouth daily.      [provider]  naproxen sodium (ALEVE) 220 MG tablet Take 440-660 mg by mouth daily as needed (pain).    [provider]  tamsulosin (FLOMAX) 0.4 MG CAPS capsule Take 0.4 mg by mouth daily. 06/16/17   [provider]  torsemide (DEMADEX) 20 MG tablet Take 2 tablets by mouth 4 (four) times daily. 07/08/17   [provider]    Family History Family History  Problem Relation Age of Onset  . Colon cancer Neg Hx   . Liver disease Neg Hx     Social History Social History   Tobacco Use  . Smoking status: Former Smoker    Years: 12.00    Quit date: 05/04/1972    Years since quitting: 46.8  . Smokeless tobacco: Never Used  Substance Use Topics  . Alcohol use: No    Alcohol/week: 0.0 standard drinks    Comment: hx alcohol abuse-- per pt  none since 2013.  . Drug use: No    Comment: Previous IV drug user ovr 30 yrs ago--  per pt quit 1980's     Allergies   Patient has no known allergies.   Review of Systems Review  of Systems  Unable to perform ROS: Other  Gastrointestinal: Positive for vomiting.     Physical Exam Updated Vital Signs BP 111/76   Pulse 70   Temp 98 F (36.7 C) (Oral)   Resp 15   Ht 5\' 7"  (1.702 m)   Wt 79.4 kg   SpO2 100%   BMI 27.41 kg/m    Patient Vitals for the past 24 hrs:  BP Temp Temp src Pulse Resp SpO2 Height Weight  02/10/19 1300 111/76 - - 70 15 100 % - -  02/10/19 1230 106/78 - - 73 12 100 % - -  02/10/19 1200 110/78 - - 73 17 100 % - -  02/10/19  9678 96/70 98 F (36.7 C) Oral 66 16 100 % 5\' 7"  (1.702 m) 79.4 kg    Physical Exam 1100: Physical examination:  Nursing notes reviewed; Vital signs and O2 SAT reviewed;  Constitutional:  Disheveled, poor hygiene. In no acute distress; Head:  Normocephalic, atraumatic; Eyes: EOMI, PERRL, No scleral icterus; ENMT: Mouth and pharynx normal, Mucous membranes dry; Neck: Supple, Full range of motion, No lymphadenopathy; Cardiovascular: Regular rate and rhythm, No gallop; Respiratory: Breath sounds clear & equal bilaterally, No wheezes.  Speaking full sentences with ease, Normal respiratory effort/excursion; Chest: Nontender, Movement normal; Abdomen: Soft, +diffuse tenderness to palp. +colostomy bag with small leak, yellow-brown stool on clothes, legs. Nondistended, Normal bowel sounds; Genitourinary: No CVA tenderness; Extremities: Peripheral pulses normal, +small round shallow chronic wound to right upper lateral lower leg, serous drainage, mild surrounding erythema, no streaking, no bleeding, no purulent drainage, no fluctuance, no edema, no soft tissue crepitus. No tenderness, No edema, No calf edema or asymmetry.; Neuro: Awake, alert, tangential historian. No facial droop.  Speech clear. No gross focal motor deficits in extremities.; Skin: Color normal, Warm, Dry.    ED Treatments / Results  Labs (all labs ordered are listed, but only abnormal results are displayed)   EKG EKG Interpretation  Date/Time:  Wednesday  February 10 2019 11:40:34 EDT Ventricular Rate:  76 PR Interval:    QRS Duration: 87 QT Interval:  395 QTC Calculation: 445 R Axis:   -16 Text Interpretation:  Sinus rhythm Borderline left axis deviation Low voltage, precordial leads Artifact When compared with ECG of 05/24/2016, 12/28/2009 No significant change was found Confirmed by Francine Graven [phone]) on 02/10/2019 11:59:13 AM   Radiology   Procedures Procedures (including critical care time)  Medications Ordered in ED Medications  iohexol (OMNIPAQUE) 300 MG/ML solution 100 mL (has no administration in time range)     Initial Impression / Assessment and Plan / ED Course  I have reviewed the triage vital signs and the nursing notes.  Pertinent labs & imaging results that were available during my care of the patient were reviewed by me and considered in my medical decision making (see chart for details).     MDM Reviewed: previous chart, nursing note and vitals Reviewed previous: labs and ECG Interpretation: labs, ECG, x-ray and CT scan    Results for orders placed or performed during the hospital encounter of 02/10/19  Comprehensive metabolic panel  Result Value Ref Range   Sodium 137 135 - 145 mmol/L   Potassium 4.1 3.5 - 5.1 mmol/L   Chloride 108 98 - 111 mmol/L   CO2 17 (L) 22 - 32 mmol/L   Glucose, Bld 308 (H) 70 - 99 mg/dL   BUN 25 (H) 8 - 23 mg/dL   Creatinine, Ser 1.62 (H) 0.61 - 1.24 mg/dL   Calcium 8.6 (L) 8.9 - 10.3 mg/dL   Total Protein 7.0 6.5 - 8.1 g/dL   Albumin 3.1 (L) 3.5 - 5.0 g/dL   AST 18 15 - 41 U/L   ALT 21 0 - 44 U/L   Alkaline Phosphatase 145 (H) 38 - 126 U/L   Total Bilirubin 0.8 0.3 - 1.2 mg/dL   GFR calc non Af Amer 40 (L) >60 mL/min   GFR calc Af Amer 47 (L) >60 mL/min   Anion gap 12 5 - 15  Lipase, blood  Result Value Ref Range   Lipase 39 11 - 51 U/L  Lactic acid, plasma  Result Value Ref Range   Lactic  Acid, Venous 2.0 (HH) 0.5 - 1.9 mmol/L  Lactic acid, plasma  Result  Value Ref Range   Lactic Acid, Venous 1.7 0.5 - 1.9 mmol/L  CBC with Differential  Result Value Ref Range   WBC 11.0 (H) 4.0 - 10.5 K/uL   RBC 4.22 4.22 - 5.81 MIL/uL   Hemoglobin 11.6 (L) 13.0 - 17.0 g/dL   HCT 36.8 (L) 39.0 - 52.0 %   MCV 87.2 80.0 - 100.0 fL   MCH 27.5 26.0 - 34.0 pg   MCHC 31.5 30.0 - 36.0 g/dL   RDW 14.9 11.5 - 15.5 %   Platelets 309 150 - 400 K/uL   nRBC 0.0 0.0 - 0.2 %   Neutrophils Relative % 84 %   Neutro Abs 9.2 (H) 1.7 - 7.7 K/uL   Lymphocytes Relative 10 %   Lymphs Abs 1.1 0.7 - 4.0 K/uL   Monocytes Relative 6 %   Monocytes Absolute 0.6 0.1 - 1.0 K/uL   Eosinophils Relative 0 %   Eosinophils Absolute 0.0 0.0 - 0.5 K/uL   Basophils Relative 0 %   Basophils Absolute 0.0 0.0 - 0.1 K/uL   Immature Granulocytes 0 %   Abs Immature Granulocytes 0.04 0.00 - 0.07 K/uL  Protime-INR  Result Value Ref Range   Prothrombin Time 14.5 11.4 - 15.2 seconds   INR 1.1 0.8 - 1.2  Troponin I (High Sensitivity)  Result Value Ref Range   Troponin I (High Sensitivity) 7 <18 ng/L  Troponin I (High Sensitivity)  Result Value Ref Range   Troponin I (High Sensitivity) 7 <18 ng/L    Ct Abdomen Pelvis Wo Contrast Result Date: 02/10/2019 CLINICAL DATA:  Abdominal pain, emesis, poor historian history of colostomy. EXAM: CT ABDOMEN AND PELVIS WITHOUT CONTRAST TECHNIQUE: Multidetector CT imaging of the abdomen and pelvis was performed following the standard protocol without IV contrast. COMPARISON:  CT pelvis 04/28/2017, CT abdomen pelvis 04/18/2017 FINDINGS: Lower chest: Atherosclerotic calcification of the coronary arteries. Calcification of the aortic leaflets. Normal heart size. No pericardial effusion. Hepatobiliary: No focal liver abnormality is seen. Several calcified gallstones without gallbladder wall thickening, or biliary dilatation. Pancreas: Diffuse pancreatic atrophy with a multitude coarse parenchymal calcifications. Spleen: Normal in size without focal abnormality.  Adrenals/Urinary Tract: Normal adrenal glands. Mild bilateral nonspecific perinephric stranding, a nonspecific finding though may correlate with either age or decreased renal function. Stable fluid attenuation cyst in the interpolar region right kidney. Nonobstructing calculus in the lower pole left kidney. No concerning renal masses. No hydronephrosis. Few nonobstructing calculi. Urinary bladder is circumferentially thickened and partially decompressed by inflated suprapubic catheter with some stranding along tubing tract. Stomach/Bowel: Distal esophagus stomach and duodenal sweep are normal. 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 The bowel contained within the hernia sac is more normal caliber but does demonstrate adjacent hazy stranding and the bowel exiting the hernia sac is largely decompressed. Much of the colon is decompressed as well to the level of the diverting colostomy and beyond. 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 Vascular/Lymphatic: Atherosclerotic plaque within the normal caliber aorta. Several hazy reactive appearing mid mesenteric lymph nodes are present. Reproductive: Extensive stranding and postsurgical changes prostatectomy bed including several coarse calcifications surgical material. Fluid is seen tracking into the left inguinal  canal. 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 Fluid is seen tracking into the left inguinal canal. Additional trace free fluid is present within the peristomal hernia sac. No free gas. No other bowel containing hernia or abdominal wall  defect. Injection posteriorly. Mild body wall edema. Musculoskeletal: Multilevel degenerative changes are present in the imaged portions of the spine. IMPRESSION: 1. 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 The bowel contained within the hernia sac is more normal caliber but does demonstrate adjacent hazy mesenteric stranding and reactive appearing mid mesenteric lymph nodes and reactive free fluid which raises concern for a developing closed loop obstruction contained within the sac itself. 2. Extensive postsurgical and postinflammatory changes in the deep pelvis at the site of prior prostatectomy with complicating necrosis. 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 Consider evaluation with urinalysis. 3. Free fluid in the pelvis tracking into the left inguinal canal. 4. Coarse calcifications of the pancreas may reflect sequela of chronic pancreatitis 5. Cholelithiasis without evidence of acute cholecystitis. 6. Aortic Atherosclerosis (ICD10-I70.0). These results were called by telephone at the time of interpretation on 02/10/2019 at 2:27 pm to Dr. Francine Graven , who verbally acknowledged these results. Electronically Signed   By: Lovena Le M.D.   On: 02/10/2019 14:30   Dg Chest 2 View Result Date: 02/10/2019 CLINICAL DATA:  Vomiting last night and this morning. EXAM: CHEST - 2 VIEW COMPARISON:  PA and lateral chest 07/15/2017. FINDINGS: Lungs are clear. Heart size is normal. No pneumothorax or pleural fluid. Aortic atherosclerosis is noted. No acute or focal bony abnormality. IMPRESSION: No acute disease. Atherosclerosis. Electronically Signed   By: Inge Rise M.D.   On: 02/10/2019 14:00    Colin [name] was evaluated in Emergency Department on 02/10/2019 for the symptoms described in the history of  present illness. He was evaluated in the context of the global COVID-19 pandemic, which necessitated consideration that the patient might be at risk for infection with the SARS-CoV-2 virus that causes COVID-19. Institutional protocols and algorithms that pertain to the evaluation of patients at risk for COVID-19 are in a state of rapid change based on information released by regulatory bodies including the CDC and federal and state organizations. These policies and algorithms were followed during the patient's care in the ED.   1450:  CT as above. Pt has had stool output and colostomy bag change while in the ED. No N/V while in the ED. T/C returned from General Surgery Dr. Constance Haw, case discussed, including:  HPI, pertinent PM/SHx, VS/PE, dx testing, ED course and treatment:  Agreeable to view the CT scan and call back.   1520:  Sign out to Dr. Roderic Palau.    Final Clinical Impressions(s) / ED Diagnoses   Final diagnoses:  None    ED Discharge Orders    None       Francine Graven, DO 02/10/19 1522

## 2019-02-10 NOTE — ED Notes (Signed)
Given clear liquid tray.

## 2019-02-10 NOTE — H&P (Signed)
History and Physical    Colin [name] QJF:[id number] DOB: Aug 05, 1940 DOA: 02/10/2019  PCP: Redmond School, MD   Patient coming from: Home  I have personally briefly reviewed patient's old medical records in [place]  Chief Complaint: Abdominal pain, vomiting.  HPI: Colin [name] is a 78 y.o. male with medical history significant for diabetes mellitus, hypertension, hepatitis C, MRSA bacteremia, prostate cancer, chronic suprapubic catheter, CKD 3.  Presented to the ED with complaints of vomiting last night and this morning, and pain from his colostomy site.  Patient answers a few questions appropriately, and shows me his colostomy bag which has about 79mls of greenish fluid with small amount of stool, but he is not able to give a lot of details.  He denies pain at this time, no difficulty breathing.  ED Course: Vitals stable.  WBC 11.  Creatinine about baseline 1.6.  Lactic acid 2 >> 1.7.  Abdominal CT shows small bowel obstruction transition point at the level of the bowel containing. 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 Extensive postsurgical and postinflammatory changes in the deep pelvis at the site of prior prostatectomy with complicating necrosis. EDP talked to neurosurgery, Dr. Hillary Bow clear liquid diet, hospitalist admission.  Advance diet as tolerated.  Review of Systems: As per HPI all other systems reviewed and negative.  Past Medical History:  Diagnosis Date  . Arthritis   . Bilateral lower extremity edema   . Chronic venous insufficiency   . CKD (chronic kidney disease), stage III ([place])   . History of alcohol abuse    none since 2013 per pt  . History of chronic hepatitis    Genotype 1, FO/F1-- treated w/ harvoni (completed May 2016)  . History of external beam radiation therapy    2007 prostate  . History of intravenous drug use in remission    per pt in remission since 1980's  . HOH (hard of hearing)   . Hypertension   . Normocytic anemia    . Recurrent prostate cancer [facility]) urologist-  dr Junious Silk   dx 2007  s/p  external beam radioation therapy/ recurrent 12-2015  Gleason 4+4  . Type 2 diabetes, diet controlled ([place])    no meds since 2010 approx    Past Surgical History:  Procedure Laterality Date  . CATARACT EXTRACTION W/PHACO Left 12/14/2012   Procedure: CATARACT EXTRACTION PHACO AND INTRAOCULAR LENS PLACEMENT (IOC);  Surgeon: Tonny Branch, MD;  Location: [facility];  Service: Ophthalmology;  Laterality: Left;  CDE: 13.74  . CATARACT EXTRACTION W/PHACO Right 12/28/2012   Procedure: CATARACT EXTRACTION PHACO AND INTRAOCULAR LENS PLACEMENT (IOC);  Surgeon: Tonny Branch, MD;  Location: [facility];  Service: Ophthalmology;  Laterality: Right;  CDE:12.80  . COLONOSCOPY N/A 02/14/2014   SLH:[id number] colonic polyps-removed as described above Colonic diverticulosis (tubular adenoma)  . CRYOABLATION N/A 05/24/2016   Procedure: CRYO ABLATION PROSTATE;  Surgeon: Festus Aloe, MD;  Location[facility];  Service: Urology;  Laterality: N/A;  . ENDOVENOUS ABLATION SAPHENOUS VEIN W/ LASER  2013   Right leg X 2 by Dr. Debara Pickett  . ESOPHAGOGASTRODUODENOSCOPY N/A 02/14/2014   TLX:BWIOMB large pedunculated gastric polyp (hyperplastic)  . INCISION AND DRAINAGE OF PERITONSILLAR ABCESS  04/24/2017   Procedure: INCISION AND DRAINAGE OF PERINEUM ABCESS;  Surgeon: Franchot Gallo, MD;  Location: [place];  Service: Urology;;  . INSERTION OF SUPRAPUBIC CATHETER N/A 04/24/2017   Procedure: OPEN INSERTION OF SUPRAPUBIC CATHETER;  Surgeon: Franchot Gallo, MD;  Location: [place];  Service: Urology;  Laterality: N/A;  . IR CATHETER TUBE CHANGE  01/01/2018  . IR FLUORO RM 30-60 MIN  09/09/2017  . LAPAROSCOPIC DIVERTED COLOSTOMY N/A 04/24/2017   Procedure: LAPAROSCOPIC SIGMOID COLOSTOMY;  Surgeon: Ileana Roup, MD;  Location: [place];  Service: General;  Laterality: N/A;  . REIMPLANTATION OF TOTAL KNEE Left 03/16/2010  . TONSILLECTOMY  child  .  TOTAL KNEE  PROSTHESIS REMOVAL W/ SPACER INSERTION Left 12/29/2009  . TOTAL KNEE ARTHROPLASTY Bilateral left 2007/   right 2005 approx     reports that he quit smoking about 46 years ago. He quit after 12.00 years of use. He has never used smokeless tobacco. He reports that he does not drink alcohol or use drugs.  No Known Allergies  Family History  Problem Relation Age of Onset  . Colon cancer Neg Hx   . Liver disease Neg Hx     Prior to Admission medications   Medication Sig Start Date End Date Taking? Authorizing Provider  doxycycline (VIBRAMYCIN) 100 MG capsule Take 1 capsule by mouth daily. 06/24/17   [provider]  ferrous sulfate 325 (65 FE) MG tablet Take 325 mg by mouth 3 (three) times daily with meals.    [provider]  Multiple Vitamins-Minerals (MULTIVITAMIN WITH MINERALS) tablet Take 1 tablet by mouth daily.      [provider]  naproxen sodium (ALEVE) 220 MG tablet Take 440-660 mg by mouth daily as needed (pain).    [provider]  tamsulosin (FLOMAX) 0.4 MG CAPS capsule Take 0.4 mg by mouth daily. 06/16/17   [provider]  torsemide (DEMADEX) 20 MG tablet Take 2 tablets by mouth 4 (four) times daily. 07/08/17   [provider]    Physical Exam: Vitals:   02/10/19 1530 02/10/19 1548 02/10/19 1630 02/10/19 1700  BP: 114/76 114/76 108/89 123/69  Pulse: 73 68 61 65  Resp: 12 16 13 17   Temp:      TempSrc:      SpO2: (!) 86% 96% (!) 76% 100%  Weight:      Height:        Constitutional: NAD, calm, comfortable Vitals:   02/10/19 1530 02/10/19 1548 02/10/19 1630 02/10/19 1700  BP: 114/76 114/76 108/89 123/69  Pulse: 73 68 61 65  Resp: 12 16 13 17   Temp:      TempSrc:      SpO2: (!) 86% 96% (!) 76% 100%  Weight:      Height:       Eyes: PERRL, lids and conjunctivae normal ENMT: Mucous membranes are moist. Posterior pharynx clear of any exudate or lesions.Normal dentition.  Neck: normal, supple, no  masses, no thyromegaly Respiratory: clear to auscultation bilaterally, no wheezing, no crackles. Normal respiratory effort. No accessory muscle use.  Cardiovascular: Regular rate and rhythm, no murmurs / rubs / gallops. No extremity edema. 2+ pedal pulses. No carotid bruits.  Abdomen: no tenderness, colostomy bag in situ, ~16ms of bile colored fluid in bag with small specks of stool, no masses palpated. No hepatosplenomegaly.  Suprapubic catheter in place with small amount of cream-colored fluid surrounding site of ostomy. Musculoskeletal: no clubbing / cyanosis. No joint deformity upper and lower extremities. Good ROM, no contractures. Normal muscle tone.  Skin: Right lower extremity appears to be more erythematous and bigger than left, no swelling, no pitting, no tenderness, patient reports this is normal for him.  He has a chronic healing wound to the lateral aspect of his right  lower extremity, dry, no drainage. Neurologic: CN 2-12 grossly intact.  Strength 5/5 in all 4.  Psychiatric: Normal judgment and insight. Alert and oriented x 2. Normal mood.   Labs on Admission: I have personally reviewed following labs and imaging studies  CBC: Recent Labs  Lab 02/10/19 1200  WBC 11.0*  NEUTROABS 9.2*  HGB 11.6*  HCT 36.8*  MCV 87.2  PLT 378   Basic Metabolic Panel: Recent Labs  Lab 02/10/19 1200  NA 137  K 4.1  CL 108  CO2 17*  GLUCOSE 308*  BUN 25*  CREATININE 1.62*  CALCIUM 8.6*   Liver Function Tests: Recent Labs  Lab 02/10/19 1200  AST 18  ALT 21  ALKPHOS 145*  BILITOT 0.8  PROT 7.0  ALBUMIN 3.1*   Recent Labs  Lab 02/10/19 1200  LIPASE 39   Coagulation Profile: Recent Labs  Lab 02/10/19 1200  INR 1.1    Radiological Exams on Admission: Ct Abdomen Pelvis Wo Contrast  Result Date: 02/10/2019 CLINICAL DATA:  Abdominal pain, emesis, poor historian history of colostomy. EXAM: CT ABDOMEN AND PELVIS WITHOUT CONTRAST TECHNIQUE: Multidetector CT imaging of the  abdomen and pelvis was performed following the standard protocol without IV contrast. COMPARISON:  CT pelvis 04/28/2017, CT abdomen pelvis 04/18/2017 FINDINGS: Lower chest: Atherosclerotic calcification of the coronary arteries. Calcification of the aortic leaflets. Normal heart size. No pericardial effusion. Hepatobiliary: No focal liver abnormality is seen. Several calcified gallstones without gallbladder wall thickening, or biliary dilatation. Pancreas: Diffuse pancreatic atrophy with a multitude coarse parenchymal calcifications. Spleen: Normal in size without focal abnormality. Adrenals/Urinary Tract: Normal adrenal glands. Mild bilateral nonspecific perinephric stranding, a nonspecific finding though may correlate with either age or decreased renal function. Stable fluid attenuation cyst in the interpolar region right kidney. Nonobstructing calculus in the lower pole left kidney. No concerning renal masses. No hydronephrosis. Few nonobstructing calculi. Urinary bladder is circumferentially thickened and partially decompressed by inflated suprapubic catheter with some stranding along tubing tract. Stomach/Bowel: Distal esophagus stomach and duodenal sweep are normal. 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 The bowel contained within the hernia sac is more normal caliber but does demonstrate adjacent hazy stranding and the bowel exiting the hernia sac is largely decompressed. Much of the colon is decompressed as well to the level of the diverting colostomy and beyond. 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 Vascular/Lymphatic: Atherosclerotic plaque within the normal caliber aorta. Several hazy reactive appearing mid mesenteric lymph nodes are  present. Reproductive: Extensive stranding and postsurgical changes prostatectomy bed including several coarse calcifications surgical material. Fluid is seen tracking into the left inguinal canal. 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 Fluid is seen tracking into the left inguinal canal. Additional trace free fluid is present within the peristomal hernia sac. No free gas. No other bowel containing hernia or abdominal wall defect. Injection posteriorly. Mild body wall edema. Musculoskeletal: Multilevel degenerative changes are present in the imaged portions of the spine. IMPRESSION: 1. 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 The bowel contained within the hernia sac is more normal caliber but does  demonstrate adjacent hazy mesenteric stranding and reactive appearing mid mesenteric lymph nodes and reactive free fluid which raises concern for a developing closed loop obstruction contained within the sac itself. 2. Extensive postsurgical and postinflammatory changes in the deep pelvis at the site of prior prostatectomy with complicating necrosis. 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 Consider evaluation with urinalysis. 3. Free fluid in the pelvis tracking into the left inguinal canal. 4. Coarse calcifications of the pancreas may reflect sequela of chronic pancreatitis 5. Cholelithiasis without evidence of acute cholecystitis. 6. Aortic Atherosclerosis (ICD10-I70.0). These results were called by telephone at the time of interpretation on 02/10/2019 at 2:27 pm to Dr. Francine Graven , who verbally acknowledged these results. Electronically Signed   By: Lovena Le M.D.   On: 02/10/2019 14:30   Dg Chest 2 View  Result Date: 02/10/2019 CLINICAL DATA:  Vomiting last night and this morning. EXAM: CHEST - 2 VIEW COMPARISON:  PA and lateral chest 07/15/2017. FINDINGS: Lungs are clear. Heart size is normal. No pneumothorax or pleural fluid. Aortic atherosclerosis is noted. No acute or focal bony abnormality. IMPRESSION: No acute disease. Atherosclerosis. Electronically Signed   By: Inge Rise M.D.   On: 02/10/2019 14:00    EKG: Independently reviewed. Sinus Rhythm.  No significant change from prior.  Assessment/Plan Active Problems:   SBO (small bowel obstruction) (HCC)    Small bowel obstruction-vomiting, CT shows transition point at the level of bowel containing peristomal hernia in the left lower quadrant.   - Evaluated by general surgery, Dr. Constance Haw, please see detailed note-patient clinically improving, clear liquid diet, may start developing SBO that may require revision of the ostomy. - N/s 50cc/hr  Diabetes mellitus-random glucose 308.  Not on medications for diabetes. - HGbA1c - SSI  Hypertension-stable.  Med lists torsemide, tamsulosin.  Patient tells me he is only on 3 medication-does not know what they are for.   -Hold torsemide for now with poor p.o. intake, vomiting.  Pending med reconciliation.  Chronic wound-healing.  History of MRSA bacteremia and osteomyelitis.  CKD 3-creatinine 1.62, baseline 1.6-1.8. -Hold torsemide for now, hydrate.  Recurrent prostate cancer, Suprapubic catheter-status post radioablation.  CT shows sensitive postsurgical and postinflammatory changes in the deep pelvis at the site of prior prostatectomy with complicating necrosis-UA evaluation recommended. -Resume home tamsulosin-pending med reconciliation. -Follow-up with urology on discharge for CT findings.  DVT prophylaxis: SCds, pending Gen surg eval, if procedure needed. Code Status: Full Family Communication: None at bedside Disposition  Plan:  Per rounding team Consults called: Gen Surg Admission status: Obs. MED-surg  Bethena Roys MD [facility]  02/10/2019, 6:15 PM

## 2019-02-10 NOTE — ED Notes (Signed)
Pt has suprapubic cath.

## 2019-02-10 NOTE — ED Notes (Signed)
ED TO INPATIENT HANDOFF REPORT  ED Nurse Name and Phone #: [phone]  S Name/Age/Gender Colin [name] 78 y.o. male Room/Bed: APA11/APA11  Code Status   Code Status: Prior  Home/SNF/Other Home Patient oriented to: situation Is this baseline? Yes   Triage Complete: Triage complete  Chief Complaint VOMITING  Triage Note C/o vomiting.  Pt says he vomited several time last night tand this am.  Not a good historian.  Pt is pointing at his colostomy bag and saying, "I need something done about it."  Not able to explain what he needs.  C/o pain to colostomy site.     Allergies No Known Allergies  Level of Care/Admitting Diagnosis ED Disposition    ED Disposition Condition Comment   Admit  Hospital Area: [facility] [[id number]]  Level of Care: Med-Surg [16]  Covid Evaluation: Asymptomatic Screening Protocol (No Symptoms)  Diagnosis: SBO (small bowel obstruction) [facility]) [[id number]]  Admitting Physician: Bethena Roys [phone]  Attending Physician: Bethena Roys Nessa.Cuff  PT Class (Do Not Modify): Observation [104]  PT Acc Code (Do Not Modify): Observation [10022]       B Medical/Surgery History Past Medical History:  Diagnosis Date  . Arthritis   . Bilateral lower extremity edema   . Chronic venous insufficiency   . CKD (chronic kidney disease), stage III ([place])   . History of alcohol abuse    none since 2013 per pt  . History of chronic hepatitis    Genotype 1, FO/F1-- treated w/ harvoni (completed May 2016)  . History of external beam radiation therapy    2007 prostate  . History of intravenous drug use in remission    per pt in remission since 1980's  . HOH (hard of hearing)   . Hypertension   . Normocytic anemia   . Recurrent prostate cancer [facility]) urologist-  dr Junious Silk   dx 2007  s/p  external beam radioation therapy/ recurrent 12-2015  Gleason 4+4  . Type 2 diabetes, diet controlled ([place])    no meds since 2010 approx   Past Surgical  History:  Procedure Laterality Date  . CATARACT EXTRACTION W/PHACO Left 12/14/2012   Procedure: CATARACT EXTRACTION PHACO AND INTRAOCULAR LENS PLACEMENT (IOC);  Surgeon: Tonny Branch, MD;  Location: [facility];  Service: Ophthalmology;  Laterality: Left;  CDE: 13.74  . CATARACT EXTRACTION W/PHACO Right 12/28/2012   Procedure: CATARACT EXTRACTION PHACO AND INTRAOCULAR LENS PLACEMENT (IOC);  Surgeon: Tonny Branch, MD;  Location: [facility];  Service: Ophthalmology;  Laterality: Right;  CDE:12.80  . COLONOSCOPY N/A 02/14/2014   [id number] colonic polyps-removed as described above Colonic diverticulosis (tubular adenoma)  . CRYOABLATION N/A 05/24/2016   Procedure: CRYO ABLATION PROSTATE;  Surgeon: Festus Aloe, MD;  Location: [facility];  Service: Urology;  Laterality: N/A;  . ENDOVENOUS ABLATION SAPHENOUS VEIN W/ LASER  2013   Right leg X 2 by Dr. Debara Pickett  . ESOPHAGOGASTRODUODENOSCOPY N/A 02/14/2014   HLK:[id number] large pedunculated gastric polyp (hyperplastic)  . INCISION AND DRAINAGE OF PERITONSILLAR ABCESS  04/24/2017   Procedure: INCISION AND DRAINAGE OF PERINEUM ABCESS;  Surgeon: Franchot Gallo, MD;  Location: [place];  Service: Urology;;  . INSERTION OF SUPRAPUBIC CATHETER N/A 04/24/2017   Procedure: OPEN INSERTION OF SUPRAPUBIC CATHETER;  Surgeon: Franchot Gallo, MD;  Location: [place];  Service: Urology;  Laterality: N/A;  . IR CATHETER TUBE CHANGE  01/01/2018  . IR FLUORO RM 30-60 MIN  09/09/2017  . LAPAROSCOPIC DIVERTED COLOSTOMY N/A 04/24/2017   Procedure:  LAPAROSCOPIC SIGMOID COLOSTOMY;  Surgeon: Ileana Roup, MD;  Location: [place];  Service: General;  Laterality: N/A;  . REIMPLANTATION OF TOTAL KNEE Left 03/16/2010  . TONSILLECTOMY  child  . TOTAL KNEE  PROSTHESIS REMOVAL W/ SPACER INSERTION Left 12/29/2009  . TOTAL KNEE ARTHROPLASTY Bilateral left 2007/   right 2005 approx     A IV Location/Drains/Wounds Patient Lines/Drains/Airways Status   Active  Line/Drains/Airways    Name:   Placement date:   Placement time:   Site:   Days:   Peripheral IV 02/10/19 Right Wrist   02/10/19    1300    Wrist   less than 1   Midline Single Lumen 04/24/17 Midline Left Basilic 10 cm 0 cm   16/10/96    0454    Basilic   098   Suprapubic Catheter Non-latex 16 Fr.   01/01/18    1412    Non-latex   405   Wound / Incision (Open or Dehisced) 05/01/17 Other (Comment) Perineum Mid   05/01/17    0715    Perineum   650   Wound / Incision (Open or Dehisced) 07/03/17 Other (Comment) Leg Right;Lateral superior aspect hypergranulated    07/03/17    0918    Leg   587          Intake/Output Last 24 hours  Intake/Output Summary (Last 24 hours) at 02/10/2019 2028 Last data filed at 02/10/2019 1832 Gross per 24 hour  Intake -  Output 300 ml  Net -300 ml    Labs/Imaging Results for orders placed or performed during the hospital encounter of 02/10/19 (from the past 48 hour(s))  Urinalysis, Routine w reflex microscopic     Status: Abnormal   Collection Time: 02/10/19 11:25 AM  Result Value Ref Range   Color, Urine AMBER (A) YELLOW    Comment: BIOCHEMICALS MAY BE AFFECTED BY COLOR   APPearance TURBID (A) CLEAR   Specific Gravity, Urine 1.011 1.005 - 1.030   pH 9.0 (H) 5.0 - 8.0   Glucose, UA NEGATIVE NEGATIVE mg/dL   Hgb urine dipstick SMALL (A) NEGATIVE   Bilirubin Urine NEGATIVE NEGATIVE   Ketones, ur NEGATIVE NEGATIVE mg/dL   Protein, ur 100 (A) NEGATIVE mg/dL   Nitrite NEGATIVE NEGATIVE   Leukocytes,Ua MODERATE (A) NEGATIVE   RBC / HPF 0-5 0 - 5 RBC/hpf   WBC, UA 11-20 0 - 5 WBC/hpf   Bacteria, UA MANY (A) NONE SEEN   Squamous Epithelial / LPF 0-5 0 - 5   Mucus PRESENT    Amorphous Crystal PRESENT     Comment: Performed at [facility], [address], [place], [place] [address]  Comprehensive metabolic panel     Status: Abnormal   Collection Time: 02/10/19 12:00 PM  Result Value Ref Range   Sodium 137 135 - 145 mmol/L   Potassium 4.1 3.5 - 5.1 mmol/L    Chloride 108 98 - 111 mmol/L   CO2 17 (L) 22 - 32 mmol/L   Glucose, Bld 308 (H) 70 - 99 mg/dL   BUN 25 (H) 8 - 23 mg/dL   Creatinine, Ser 1.62 (H) 0.61 - 1.24 mg/dL   Calcium 8.6 (L) 8.9 - 10.3 mg/dL   Total Protein 7.0 6.5 - 8.1 g/dL   Albumin 3.1 (L) 3.5 - 5.0 g/dL   AST 18 15 - 41 U/L   ALT 21 0 - 44 U/L   Alkaline Phosphatase 145 (H) 38 - 126 U/L   Total Bilirubin 0.8  0.3 - 1.2 mg/dL   GFR calc non Af Amer 40 (L) >60 mL/min   GFR calc Af Amer 47 (L) >60 mL/min   Anion gap 12 5 - 15    Comment: Performed at [facility], [address], [place], [place] [address]  Lipase, blood     Status: None   Collection Time: 02/10/19 12:00 PM  Result Value Ref Range   Lipase 39 11 - 51 U/L    Comment: Performed at [facility], [address], [place], [place] [address]  Troponin I (High Sensitivity)     Status: None   Collection Time: 02/10/19 12:00 PM  Result Value Ref Range   Troponin I (High Sensitivity) 7 <18 ng/L    Comment: (NOTE) Elevated high sensitivity troponin I (hsTnI) values and significant  changes across serial measurements may suggest ACS but many other  chronic and acute conditions are known to elevate hsTnI results.  Refer to the "Links" section for chest pain algorithms and additional  guidance. Performed at [facility], [address] [place], [place] [address]   Lactic acid, plasma     Status: Abnormal   Collection Time: 02/10/19 12:00 PM  Result Value Ref Range   Lactic Acid, Venous 2.0 (HH) 0.5 - 1.9 mmol/L    Comment: CRITICAL RESULT CALLED TO, READ BACK BY AND VERIFIED WITH: MINTER,R AT 12:25PM ON 02/10/19 BY Derrill Memo Performed at [facility], [address], [place], [place] [address]   CBC with Differential     Status: Abnormal   Collection Time: 02/10/19 12:00 PM  Result Value Ref Range   WBC 11.0 (H) 4.0 - 10.5 K/uL   RBC 4.22 4.22 - 5.81 MIL/uL   Hemoglobin 11.6 (L) 13.0 - 17.0 g/dL   HCT 36.8 (L) 39.0 - 52.0 %   MCV 87.2 80.0 - 100.0 fL   MCH  27.5 26.0 - 34.0 pg   MCHC 31.5 30.0 - 36.0 g/dL   RDW 14.9 11.5 - 15.5 %   Platelets 309 150 - 400 K/uL   nRBC 0.0 0.0 - 0.2 %   Neutrophils Relative % 84 %   Neutro Abs 9.2 (H) 1.7 - 7.7 K/uL   Lymphocytes Relative 10 %   Lymphs Abs 1.1 0.7 - 4.0 K/uL   Monocytes Relative 6 %   Monocytes Absolute 0.6 0.1 - 1.0 K/uL   Eosinophils Relative 0 %   Eosinophils Absolute 0.0 0.0 - 0.5 K/uL   Basophils Relative 0 %   Basophils Absolute 0.0 0.0 - 0.1 K/uL   Immature Granulocytes 0 %   Abs Immature Granulocytes 0.04 0.00 - 0.07 K/uL    Comment: Performed at [facility], [address] [place], [place] [address]  Protime-INR     Status: None   Collection Time: 02/10/19 12:00 PM  Result Value Ref Range   Prothrombin Time 14.5 11.4 - 15.2 seconds   INR 1.1 0.8 - 1.2    Comment: (NOTE) INR goal varies based on device and disease states. Performed at [facility], [address], [place], [place] [address]   Lactic acid, plasma     Status: None   Collection Time: 02/10/19  1:15 PM  Result Value Ref Range   Lactic Acid, Venous 1.7 0.5 - 1.9 mmol/L    Comment: Performed at [facility], [address] [place], [place] [address]  Troponin I (High Sensitivity)     Status: None   Collection Time: 02/10/19  1:16 PM  Result Value Ref Range  Troponin I (High Sensitivity) 7 <18 ng/L    Comment: (NOTE) Elevated high sensitivity troponin I (hsTnI) values and significant  changes across serial measurements may suggest ACS but many other  chronic and acute conditions are known to elevate hsTnI results.  Refer to the "Links" section for chest pain algorithms and additional  guidance. Performed at [facility], [address] [place], [place] [address]   SARS Coronavirus 2 [facility] order, Performed in [facility] hospital lab) Nasopharyngeal Nasopharyngeal Swab     Status: None   Collection Time: 02/10/19  2:54 PM   Specimen: Nasopharyngeal Swab  Result Value Ref Range   SARS Coronavirus 2  NEGATIVE NEGATIVE    Comment: (NOTE) If result is NEGATIVE SARS-CoV-2 target nucleic acids are NOT DETECTED. The SARS-CoV-2 RNA is generally detectable in upper and lower  respiratory specimens during the acute phase of infection. The lowest  concentration of SARS-CoV-2 viral copies this assay can detect is 250  copies / mL. A negative result does not preclude SARS-CoV-2 infection  and should not be used as the sole basis for treatment or other  patient management decisions.  A negative result may occur with  improper specimen collection / handling, submission of specimen other  than nasopharyngeal swab, presence of viral mutation(s) within the  areas targeted by this assay, and inadequate number of viral copies  (<250 copies / mL). A negative result must be combined with clinical  observations, patient history, and epidemiological information. If result is POSITIVE SARS-CoV-2 target nucleic acids are DETECTED. The SARS-CoV-2 RNA is generally detectable in upper and lower  respiratory specimens dur ing the acute phase of infection.  Positive  results are indicative of active infection with SARS-CoV-2.  Clinical  correlation with patient history and other diagnostic information is  necessary to determine patient infection status.  Positive results do  not rule out bacterial infection or co-infection with other viruses. If result is PRESUMPTIVE POSTIVE SARS-CoV-2 nucleic acids MAY BE PRESENT.   A presumptive positive result was obtained on the submitted specimen  and confirmed on repeat testing.  While 2019 novel coronavirus  (SARS-CoV-2) nucleic acids may be present in the submitted sample  additional confirmatory testing may be necessary for epidemiological  and / or clinical management purposes  to differentiate between  SARS-CoV-2 and other Sarbecovirus currently known to infect humans.  If clinically indicated additional testing with an alternate test  methodology [phone]) is  advised. The SARS-CoV-2 RNA is generally  detectable in upper and lower respiratory sp ecimens during the acute  phase of infection. The expected result is Negative. Fact Sheet for Patients:  StrictlyIdeas.no Fact Sheet for Healthcare Providers: BankingDealers.co.za This test is not yet approved or cleared by the [place] FDA and has been authorized for detection and/or diagnosis of SARS-CoV-2 by FDA under an Emergency Use Authorization (EUA).  This EUA will remain in effect (meaning this test can be used) for the duration of the COVID-19 declaration under Section 564(b)(1) of the Act, 21 U.S.C. section 360bbb-3(b)(1), unless the authorization is terminated or revoked sooner. Performed at [facility], [address], [place], [place] [address]    Ct Abdomen Pelvis Wo Contrast  Result Date: 02/10/2019 CLINICAL DATA:  Abdominal pain, emesis, poor historian history of colostomy. EXAM: CT ABDOMEN AND PELVIS WITHOUT CONTRAST TECHNIQUE: Multidetector CT imaging of the abdomen and pelvis was performed following the standard protocol without IV contrast. COMPARISON:  CT pelvis 04/28/2017, CT abdomen pelvis 04/18/2017 FINDINGS: Lower chest: Atherosclerotic calcification  of the coronary arteries. Calcification of the aortic leaflets. Normal heart size. No pericardial effusion. Hepatobiliary: No focal liver abnormality is seen. Several calcified gallstones without gallbladder wall thickening, or biliary dilatation. Pancreas: Diffuse pancreatic atrophy with a multitude coarse parenchymal calcifications. Spleen: Normal in size without focal abnormality. Adrenals/Urinary Tract: Normal adrenal glands. Mild bilateral nonspecific perinephric stranding, a nonspecific finding though may correlate with either age or decreased renal function. Stable fluid attenuation cyst in the interpolar region right kidney. Nonobstructing calculus in the lower pole left kidney. No  concerning renal masses. No hydronephrosis. Few nonobstructing calculi. Urinary bladder is circumferentially thickened and partially decompressed by inflated suprapubic catheter with some stranding along tubing tract. Stomach/Bowel: Distal esophagus stomach and duodenal sweep are normal. 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 The bowel contained within the hernia sac is more normal caliber but does demonstrate adjacent hazy stranding and the bowel exiting the hernia sac is largely decompressed. Much of the colon is decompressed as well to the level of the diverting colostomy and beyond. 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 Vascular/Lymphatic: Atherosclerotic plaque within the normal caliber aorta. Several hazy reactive appearing mid mesenteric lymph nodes are present. Reproductive: Extensive stranding and postsurgical changes prostatectomy bed including several coarse calcifications surgical material. Fluid is seen tracking into the left inguinal canal. 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 Fluid is seen tracking into the left inguinal canal. Additional trace free fluid is present within the peristomal hernia sac. No free gas. No other bowel containing hernia or abdominal wall defect. Injection posteriorly. Mild body wall edema. Musculoskeletal: Multilevel degenerative changes are present in the imaged portions of the spine. IMPRESSION: 1. 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 The bowel contained  within the hernia sac is more normal caliber but does demonstrate adjacent hazy mesenteric stranding and reactive appearing mid mesenteric lymph nodes and reactive free fluid which raises concern for a developing closed loop obstruction contained within the sac itself. 2. Extensive postsurgical and postinflammatory changes in the deep pelvis at the site of prior prostatectomy with complicating necrosis. 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 Consider evaluation with urinalysis. 3. Free fluid in the pelvis tracking into the left inguinal canal. 4. Coarse calcifications of the pancreas may reflect sequela of chronic pancreatitis 5. Cholelithiasis without evidence of acute cholecystitis. 6. Aortic Atherosclerosis (ICD10-I70.0). These results were called by telephone at the time of interpretation on 02/10/2019 at 2:27 pm to Dr. Francine Graven , who verbally acknowledged these results. Electronically Signed   By: Lovena Le M.D.   On: 02/10/2019 14:30   Dg Chest 2 View  Result Date: 02/10/2019 CLINICAL DATA:  Vomiting last night and this morning. EXAM: CHEST - 2 VIEW COMPARISON:  PA and lateral chest 07/15/2017. FINDINGS: Lungs are clear. Heart size is normal. No pneumothorax or pleural fluid. Aortic atherosclerosis is noted. No acute or focal bony abnormality. IMPRESSION: No acute disease. Atherosclerosis. Electronically Signed   By: Inge Rise M.D.   On: 02/10/2019 14:00  Pending Labs [employer] (From admission, onward)    Start     Ordered   02/10/19 1125  Urine culture  ONCE - STAT,   STAT     02/10/19 1125   Signed and Held  Basic metabolic panel  Tomorrow morning,   R     Signed and Held   Signed and Held  CBC  Tomorrow morning,   R     Signed and Held   Signed and Held  Hemoglobin A1c  Once,   R    Comments: To assess prior glycemic control    Signed and Held    Signed and Held  Hemoglobin A1c  Tomorrow morning,   R     Signed and Held          Vitals/Pain Today's Vitals   02/10/19 1630 02/10/19 1700 02/10/19 1930 02/10/19 2000  BP: 108/89 123/69 119/75 111/75  Pulse: 61 65 63 60  Resp: 13 17 10 15   Temp:      TempSrc:      SpO2: (!) 76% 100% 100% 100%  Weight:      Height:      PainSc:  0-No pain      Isolation Precautions No active isolations  Medications Medications  iohexol (OMNIPAQUE) 300 MG/ML solution 100 mL (has no administration in time range)  0.9 %  sodium chloride infusion ( Intravenous New Bag/Given 02/10/19 1546)    Mobility walks with device Low fall risk   Focused Assessments    R Recommendations: See Admitting Provider Note  Report given to:   Additional Notes: Pt has colostomy and suprapubic catheter.

## 2019-02-10 NOTE — ED Triage Notes (Signed)
C/o vomiting.  Pt says he vomited several time last night tand this am.  Not a good historian.  Pt is pointing at his colostomy bag and saying, "I need something done about it."  Not able to explain what he needs.  C/o pain to colostomy site.

## 2019-02-10 NOTE — ED Notes (Signed)
pts colostomy leaking.  Covered in soft yellow stool from waist to ankles.  ADLs done, barrier cream to perineal area.  Abrasions noted right ankle and right lateral leg.  Abrasion noted to left ankle.  Son to retrieve ostomy supplies from home.

## 2019-02-10 NOTE — Consult Note (Signed)
[facility] Consult  Reason for Consult: SBO, concern possible closed loop?  Referring Physician:  Dr. Thurnell Garbe MD (ED)   Chief Complaint    Emesis      Colin [name] is a 78 y.o. male.  HPI:  Colin [name] is a 78 yo with a history prostate cancer treated with external beam radiotherapy as salvage after cryoablation and subsequent formation of a rectourethral fistula who underwent drainage of a perineal abscess and diversion with a suprapubic foley and a end colostomy with Dr. Beatrix Fetters and Dr. Dema Severin in 2018.  He has had the colostomy since that time and describes no prior issues.  He presented to the ED today with some confusion and reported vomiting overnight by his son and per the patient leakage of his colostomy bag. The patient's son reports that the patient called him last night and that he was vomiting and had some abdominal pain. He was brought into the ED and was reported by the RN taking care of him to be distended and then subsequently had several large Bms into the colostomy bag. The patient and son deny any prior SBO history. I do not see documentation of follow up regarding the diversion and plans regarding the suprapubic catheter and colostomy.  The suprapubic catheter has been changed out by IR in 2019.    Currently the patient reports no further nausea/vomiting. He has not vomited since being in the ED. He says he is hungry. He has had multiple full bags of liquid stool in his ostomy bag.  He does not complain of any pain.   Past Medical History:  Diagnosis Date  . Arthritis   . Bilateral lower extremity edema   . Chronic venous insufficiency   . CKD (chronic kidney disease), stage III ([place])   . History of alcohol abuse    none since 2013 per pt  . History of chronic hepatitis    Genotype 1, FO/F1-- treated w/ harvoni (completed May 2016)  . History of external beam radiation therapy    2007 prostate  . History of intravenous drug use in remission    per pt  in remission since 1980's  . HOH (hard of hearing)   . Hypertension   . Normocytic anemia   . Recurrent prostate cancer [facility]) urologist-  dr Junious Silk   dx 2007  s/p  external beam radioation therapy/ recurrent 12-2015  Gleason 4+4  . Type 2 diabetes, diet controlled ([place])    no meds since 2010 approx    Past Surgical History:  Procedure Laterality Date  . CATARACT EXTRACTION W/PHACO Left 12/14/2012   Procedure: CATARACT EXTRACTION PHACO AND INTRAOCULAR LENS PLACEMENT (IOC);  Surgeon: Tonny Branch, MD;  Location: [facility];  Service: Ophthalmology;  Laterality: Left;  CDE: 13.74  . CATARACT EXTRACTION W/PHACO Right 12/28/2012   Procedure: CATARACT EXTRACTION PHACO AND INTRAOCULAR LENS PLACEMENT (IOC);  Surgeon: Tonny Branch, MD;  Location: [facility];  Service: Ophthalmology;  Laterality: Right;  CDE:12.80  . COLONOSCOPY N/A 02/14/2014   CBU:[id number] colonic polyps-removed as described above Colonic diverticulosis (tubular adenoma)  . CRYOABLATION N/A 05/24/2016   Procedure: CRYO ABLATION PROSTATE;  Surgeon: Festus Aloe, MD;  Location: [facility];  Service: Urology;  Laterality: N/A;  . ENDOVENOUS ABLATION SAPHENOUS VEIN W/ LASER  2013   Right leg X 2 by Dr. Debara Pickett  . ESOPHAGOGASTRODUODENOSCOPY N/A 02/14/2014   OEH:OZYYQM large pedunculated gastric polyp (hyperplastic)  . INCISION AND DRAINAGE OF PERITONSILLAR ABCESS  04/24/2017  Procedure: INCISION AND DRAINAGE OF PERINEUM ABCESS;  Surgeon: Franchot Gallo, MD;  Location: [place];  Service: Urology;;  . INSERTION OF SUPRAPUBIC CATHETER N/A 04/24/2017   Procedure: OPEN INSERTION OF SUPRAPUBIC CATHETER;  Surgeon: Franchot Gallo, MD;  Location: [place];  Service: Urology;  Laterality: N/A;  . IR CATHETER TUBE CHANGE  01/01/2018  . IR FLUORO RM 30-60 MIN  09/09/2017  . LAPAROSCOPIC DIVERTED COLOSTOMY N/A 04/24/2017   Procedure: LAPAROSCOPIC SIGMOID COLOSTOMY;  Surgeon: Ileana Roup, MD;  Location: [place];  Service: General;   Laterality: N/A;  . REIMPLANTATION OF TOTAL KNEE Left 03/16/2010  . TONSILLECTOMY  child  . TOTAL KNEE  PROSTHESIS REMOVAL W/ SPACER INSERTION Left 12/29/2009  . TOTAL KNEE ARTHROPLASTY Bilateral left 2007/   right 2005 approx    Family History  Problem Relation Age of Onset  . Colon cancer Neg Hx   . Liver disease Neg Hx     Social History   Tobacco Use  . Smoking status: Former Smoker    Years: 12.00    Quit date: 05/04/1972    Years since quitting: 46.8  . Smokeless tobacco: Never Used  Substance Use Topics  . Alcohol use: No    Alcohol/week: 0.0 standard drinks    Comment: hx alcohol abuse-- per pt  none since 2013.  . Drug use: No    Comment: Previous IV drug user ovr 30 yrs ago--  per pt quit 1980's    Medications:  I have reviewed the patient's current medications. Current Facility-Administered Medications  Medication Dose Route Frequency Provider Last Rate Last Dose  . 0.9 %  sodium chloride infusion   Intravenous Continuous Francine Graven, DO 100 mL/hr at 02/10/19 1546    . iohexol (OMNIPAQUE) 300 MG/ML solution 100 mL  100 mL Intravenous Once PRN Francine Graven, DO       Current Outpatient Medications  Medication Sig Dispense Refill Last Dose  . ferrous sulfate 325 (65 FE) MG tablet Take 325 mg by mouth 3 (three) times daily with meals.   unknown  . tamsulosin (FLOMAX) 0.4 MG CAPS capsule Take 0.4 mg by mouth daily.  11 unknown  . Multiple Vitamins-Minerals (MULTIVITAMIN WITH MINERALS) tablet Take 1 tablet by mouth daily.     unknown  . naproxen sodium (ALEVE) 220 MG tablet Take 440-660 mg by mouth daily as needed (pain).   unknown  . torsemide (DEMADEX) 20 MG tablet Take 2 tablets by mouth 4 (four) times daily.  1 Not Taking at Unknown time   No Known Allergies    ROS:  A comprehensive review of systems was negative except for: Gastrointestinal: positive for vomiting and ? distention, no reported pain to me  Blood pressure 119/75, pulse 63,  temperature 98 F (36.7 C), temperature source Oral, resp. rate 10, height 5\' 7"  (1.702 m), weight 79.4 kg, SpO2 100 %. Physical Exam Vitals signs reviewed.  Constitutional:      General: He is not in acute distress. HENT:     Head: Normocephalic and atraumatic.     Nose: Nose normal.     Mouth/Throat:     Mouth: Mucous membranes are moist.  Eyes:     Extraocular Movements: Extraocular movements intact.     Pupils: Pupils are equal, round, and reactive to light.  Neck:     Musculoskeletal: Normal range of motion.  Cardiovascular:     Rate and Rhythm: Normal rate.  Pulmonary:     Effort: Pulmonary effort is normal.  Abdominal:     General: There is no distension.     Palpations: Abdomen is soft.     Tenderness: There is no abdominal tenderness.     Comments: Colostomy in the left abdomen, nontender, appreciate some degree of hernia around, but soft, and feels mostly reducible, liquid stool in bag  Musculoskeletal:        General: No swelling.  Skin:    General: Skin is warm and dry.  Neurological:     General: No focal deficit present.     Mental Status: He is alert. Mental status is at baseline.  Psychiatric:        Mood and Affect: Mood normal.        Behavior: Behavior normal.     Comments: confusion     Results: Results for orders placed or performed during the hospital encounter of 02/10/19 (from the past 48 hour(s))  Urinalysis, Routine w reflex microscopic     Status: Abnormal   Collection Time: 02/10/19 11:25 AM  Result Value Ref Range   Color, Urine AMBER (A) YELLOW    Comment: BIOCHEMICALS MAY BE AFFECTED BY COLOR   APPearance TURBID (A) CLEAR   Specific Gravity, Urine 1.011 1.005 - 1.030   pH 9.0 (H) 5.0 - 8.0   Glucose, UA NEGATIVE NEGATIVE mg/dL   Hgb urine dipstick SMALL (A) NEGATIVE   Bilirubin Urine NEGATIVE NEGATIVE   Ketones, ur NEGATIVE NEGATIVE mg/dL   Protein, ur 100 (A) NEGATIVE mg/dL   Nitrite NEGATIVE NEGATIVE   Leukocytes,Ua MODERATE (A)  NEGATIVE   RBC / HPF 0-5 0 - 5 RBC/hpf   WBC, UA 11-20 0 - 5 WBC/hpf   Bacteria, UA MANY (A) NONE SEEN   Squamous Epithelial / LPF 0-5 0 - 5   Mucus PRESENT    Amorphous Crystal PRESENT     Comment: Performed at [facility], [address], [place], [place] [address]  Comprehensive metabolic panel     Status: Abnormal   Collection Time: 02/10/19 12:00 PM  Result Value Ref Range   Sodium 137 135 - 145 mmol/L   Potassium 4.1 3.5 - 5.1 mmol/L   Chloride 108 98 - 111 mmol/L   CO2 17 (L) 22 - 32 mmol/L   Glucose, Bld 308 (H) 70 - 99 mg/dL   BUN 25 (H) 8 - 23 mg/dL   Creatinine, Ser 1.62 (H) 0.61 - 1.24 mg/dL   Calcium 8.6 (L) 8.9 - 10.3 mg/dL   Total Protein 7.0 6.5 - 8.1 g/dL   Albumin 3.1 (L) 3.5 - 5.0 g/dL   AST 18 15 - 41 U/L   ALT 21 0 - 44 U/L   Alkaline Phosphatase 145 (H) 38 - 126 U/L   Total Bilirubin 0.8 0.3 - 1.2 mg/dL   GFR calc non Af Amer 40 (L) >60 mL/min   GFR calc Af Amer 47 (L) >60 mL/min   Anion gap 12 5 - 15    Comment: Performed at [facility], [address], [place], [place] [address]  Lipase, blood     Status: None   Collection Time: 02/10/19 12:00 PM  Result Value Ref Range   Lipase 39 11 - 51 U/L    Comment: Performed at [facility], [address], [place], [place] [address]  Troponin I (High Sensitivity)     Status: None   Collection Time: 02/10/19 12:00 PM  Result Value Ref Range   Troponin I (High Sensitivity) 7 <18 ng/L    Comment: (  NOTE) Elevated high sensitivity troponin I (hsTnI) values and significant  changes across serial measurements may suggest ACS but many other  chronic and acute conditions are known to elevate hsTnI results.  Refer to the "Links" section for chest pain algorithms and additional  guidance. Performed at [facility], [address] [place], [place] [address]   Lactic acid, plasma     Status: Abnormal   Collection Time: 02/10/19 12:00 PM  Result Value Ref Range   Lactic Acid, Venous 2.0 (HH) 0.5 - 1.9 mmol/L     Comment: CRITICAL RESULT CALLED TO, READ BACK BY AND VERIFIED WITH: MINTER,R AT 12:25PM ON 02/10/19 BY Derrill Memo Performed at [facility], [address], [place], [place] [address]   CBC with Differential     Status: Abnormal   Collection Time: 02/10/19 12:00 PM  Result Value Ref Range   WBC 11.0 (H) 4.0 - 10.5 K/uL   RBC 4.22 4.22 - 5.81 MIL/uL   Hemoglobin 11.6 (L) 13.0 - 17.0 g/dL   HCT 36.8 (L) 39.0 - 52.0 %   MCV 87.2 80.0 - 100.0 fL   MCH 27.5 26.0 - 34.0 pg   MCHC 31.5 30.0 - 36.0 g/dL   RDW 14.9 11.5 - 15.5 %   Platelets 309 150 - 400 K/uL   nRBC 0.0 0.0 - 0.2 %   Neutrophils Relative % 84 %   Neutro Abs 9.2 (H) 1.7 - 7.7 K/uL   Lymphocytes Relative 10 %   Lymphs Abs 1.1 0.7 - 4.0 K/uL   Monocytes Relative 6 %   Monocytes Absolute 0.6 0.1 - 1.0 K/uL   Eosinophils Relative 0 %   Eosinophils Absolute 0.0 0.0 - 0.5 K/uL   Basophils Relative 0 %   Basophils Absolute 0.0 0.0 - 0.1 K/uL   Immature Granulocytes 0 %   Abs Immature Granulocytes 0.04 0.00 - 0.07 K/uL    Comment: Performed at [facility], [address], [place], [place] [address]  Protime-INR     Status: None   Collection Time: 02/10/19 12:00 PM  Result Value Ref Range   Prothrombin Time 14.5 11.4 - 15.2 seconds   INR 1.1 0.8 - 1.2    Comment: (NOTE) INR goal varies based on device and disease states. Performed at [facility], [address], [place], [place] [address]   Lactic acid, plasma     Status: None   Collection Time: 02/10/19  1:15 PM  Result Value Ref Range   Lactic Acid, Venous 1.7 0.5 - 1.9 mmol/L    Comment: Performed at [facility], [address], [place], [place] [address]  Troponin I (High Sensitivity)     Status: None   Collection Time: 02/10/19  1:16 PM  Result Value Ref Range   Troponin I (High Sensitivity) 7 <18 ng/L    Comment: (NOTE) Elevated high sensitivity troponin I (hsTnI) values and significant  changes across serial measurements may suggest ACS but many other  chronic  and acute conditions are known to elevate hsTnI results.  Refer to the "Links" section for chest pain algorithms and additional  guidance. Performed at [facility], [address], [place],  [address]   SARS Coronavirus 2 [facility] order, Performed in [facility] hospital lab) Nasopharyngeal Nasopharyngeal Swab     Status: None   Collection Time: 02/10/19  2:54 PM   Specimen: Nasopharyngeal Swab  Result Value Ref Range   SARS Coronavirus 2 NEGATIVE NEGATIVE    Comment: (NOTE) If result is NEGATIVE SARS-CoV-2  target nucleic acids are NOT DETECTED. The SARS-CoV-2 RNA is generally detectable in upper and lower  respiratory specimens during the acute phase of infection. The lowest  concentration of SARS-CoV-2 viral copies this assay can detect is 250  copies / mL. A negative result does not preclude SARS-CoV-2 infection  and should not be used as the sole basis for treatment or other  patient management decisions.  A negative result may occur with  improper specimen collection / handling, submission of specimen other  than nasopharyngeal swab, presence of viral mutation(s) within the  areas targeted by this assay, and inadequate number of viral copies  (<250 copies / mL). A negative result must be combined with clinical  observations, patient history, and epidemiological information. If result is POSITIVE SARS-CoV-2 target nucleic acids are DETECTED. The SARS-CoV-2 RNA is generally detectable in upper and lower  respiratory specimens dur ing the acute phase of infection.  Positive  results are indicative of active infection with SARS-CoV-2.  Clinical  correlation with patient history and other diagnostic information is  necessary to determine patient infection status.  Positive results do  not rule out bacterial infection or co-infection with other viruses. If result is PRESUMPTIVE POSTIVE SARS-CoV-2 nucleic acids MAY BE PRESENT.   A presumptive positive result was obtained on  the submitted specimen  and confirmed on repeat testing.  While 2019 novel coronavirus  (SARS-CoV-2) nucleic acids may be present in the submitted sample  additional confirmatory testing may be necessary for epidemiological  and / or clinical management purposes  to differentiate between  SARS-CoV-2 and other Sarbecovirus currently known to infect humans.  If clinically indicated additional testing with an alternate test  methodology [phone]) is advised. The SARS-CoV-2 RNA is generally  detectable in upper and lower respiratory sp ecimens during the acute  phase of infection. The expected result is Negative. Fact Sheet for Patients:  StrictlyIdeas.no Fact Sheet for Healthcare Providers: BankingDealers.co.za This test is not yet approved or cleared by the [place] FDA and has been authorized for detection and/or diagnosis of SARS-CoV-2 by FDA under an Emergency Use Authorization (EUA).  This EUA will remain in effect (meaning this test can be used) for the duration of the COVID-19 declaration under Section 564(b)(1) of the Act, 21 U.S.C. section 360bbb-3(b)(1), unless the authorization is terminated or revoked sooner. Performed at [facility], [address], [place], [place] [address]    Personally reviewed CT and reviewed with Dr. Thornton Papas as soon as Dr. Thurnell Garbe called- small bowel with some dilation proximal and does go into the parastomal hernia, some stranding and hazy mesentery but no bowel thickening, small bowel is only mildly dilated, no signs of bowel ischemia or pneumotosis, do not think closed loop obstruction likely given the large hernia defect and bowel appearance  Ct Abdomen Pelvis Wo Contrast  Result Date: 02/10/2019 CLINICAL DATA:  Abdominal pain, emesis, poor historian history of colostomy. EXAM: CT ABDOMEN AND PELVIS WITHOUT CONTRAST TECHNIQUE: Multidetector CT imaging of the abdomen and pelvis was performed following the  standard protocol without IV contrast. COMPARISON:  CT pelvis 04/28/2017, CT abdomen pelvis 04/18/2017 FINDINGS: Lower chest: Atherosclerotic calcification of the coronary arteries. Calcification of the aortic leaflets. Normal heart size. No pericardial effusion. Hepatobiliary: No focal liver abnormality is seen. Several calcified gallstones without gallbladder wall thickening, or biliary dilatation. Pancreas: Diffuse pancreatic atrophy with a multitude coarse parenchymal calcifications. Spleen: Normal in size without focal abnormality. Adrenals/Urinary Tract: Normal adrenal glands. Mild bilateral nonspecific perinephric stranding, a  nonspecific finding though may correlate with either age or decreased renal function. Stable fluid attenuation cyst in the interpolar region right kidney. Nonobstructing calculus in the lower pole left kidney. No concerning renal masses. No hydronephrosis. Few nonobstructing calculi. Urinary bladder is circumferentially thickened and partially decompressed by inflated suprapubic catheter with some stranding along tubing tract. Stomach/Bowel: Distal esophagus stomach and duodenal sweep are normal. 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 The bowel contained within the hernia sac is more normal caliber but does demonstrate adjacent hazy stranding and the bowel exiting the hernia sac is largely decompressed. Much of the colon is decompressed as well to the level of the diverting colostomy and beyond. 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 Vascular/Lymphatic: Atherosclerotic plaque within the normal caliber aorta. Several hazy reactive appearing mid mesenteric lymph nodes are present. Reproductive: Extensive stranding and  postsurgical changes prostatectomy bed including several coarse calcifications surgical material. Fluid is seen tracking into the left inguinal canal. 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 Fluid is seen tracking into the left inguinal canal. Additional trace free fluid is present within the peristomal hernia sac. No free gas. No other bowel containing hernia or abdominal wall defect. Injection posteriorly. Mild body wall edema. Musculoskeletal: Multilevel degenerative changes are present in the imaged portions of the spine. IMPRESSION: 1. 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 The bowel contained within the hernia sac is more normal caliber but does demonstrate adjacent hazy mesenteric stranding and reactive appearing mid mesenteric lymph nodes and reactive free fluid which raises concern for a developing closed loop obstruction contained within the sac itself. 2. Extensive postsurgical and postinflammatory changes in the deep pelvis at the site of prior prostatectomy with complicating necrosis. 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 Consider evaluation with urinalysis. 3. Free fluid in the pelvis tracking into the left inguinal canal. 4. Coarse calcifications of the pancreas may reflect sequela of chronic pancreatitis 5. Cholelithiasis without evidence of acute cholecystitis. 6. Aortic Atherosclerosis (ICD10-I70.0). These results were called by telephone at the time of interpretation on 02/10/2019 at 2:27 pm to Dr. Francine Graven , who verbally acknowledged these results. Electronically Signed   By: Lovena Le M.D.   On: 02/10/2019 14:30   Dg Chest  2 View  Result Date: 02/10/2019 CLINICAL DATA:  Vomiting last night and this morning. EXAM: CHEST - 2 VIEW COMPARISON:  PA and lateral chest 07/15/2017. FINDINGS: Lungs are clear. Heart size is normal. No pneumothorax or pleural fluid. Aortic atherosclerosis is noted. No acute or focal bony abnormality. IMPRESSION: No acute disease. Atherosclerosis. Electronically Signed   By: Inge Rise M.D.   On: 02/10/2019 14:00     Assessment & Plan:  AADHAV UHLIG is a 78 y.o. male with a pSBO that seems to have resolved as the patient is not having any pain, is no longer nauseated or vomiting, and has had multiple episodes of BMs into his colostomy bag. 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 No leukocytosis  and lactic 1.7 after some hydration. Discussed with the patient and his son, due to the history of vomiting and concern for obstruction, he needs to be monitored and have a po trial.  If he is unable to tolerate the diet and has worsening pain, then he will need to be made NPO and NG placed.    -Discussed the parastomal hernia and discussed that if he has repeat episodes of SBO related to this that this may need to be revised.  ? Not completely sure why he never qualified for possible restoration of continence as I do not see documentation of post operative visits from 2018   -Clear diet now, monitor exam -Will adv diet as he tolerates  All questions were answered to the satisfaction of the patient and family.    Virl Cagey 02/10/2019, 8:03 PM

## 2019-02-10 NOTE — ED Notes (Signed)
CRITICAL VALUE ALERT  Critical Value:  Lactic acid 2.0  Date & Time Notied:  02/10/19, 1224  Provider Notified: Dr. Thurnell Garbe  Orders Received/Actions taken: no new orders

## 2019-02-10 NOTE — ED Notes (Signed)
Ostomy care done, appliance changed.  Large amount of yellowish/tan pasty stool noted.Colin [name]

## 2019-02-11 ENCOUNTER — Other Ambulatory Visit: Payer: Self-pay

## 2019-02-11 DIAGNOSIS — R112 Nausea with vomiting, unspecified: Secondary | ICD-10-CM

## 2019-02-11 DIAGNOSIS — K435 Parastomal hernia without obstruction or  gangrene: Secondary | ICD-10-CM

## 2019-02-11 DIAGNOSIS — R1084 Generalized abdominal pain: Secondary | ICD-10-CM

## 2019-02-11 DIAGNOSIS — C61 Malignant neoplasm of prostate: Secondary | ICD-10-CM

## 2019-02-11 DIAGNOSIS — K56609 Unspecified intestinal obstruction, unspecified as to partial versus complete obstruction: Secondary | ICD-10-CM | POA: Diagnosis not present

## 2019-02-11 DIAGNOSIS — N400 Enlarged prostate without lower urinary tract symptoms: Secondary | ICD-10-CM | POA: Diagnosis not present

## 2019-02-11 DIAGNOSIS — N179 Acute kidney failure, unspecified: Secondary | ICD-10-CM | POA: Diagnosis not present

## 2019-02-11 DIAGNOSIS — I10 Essential (primary) hypertension: Secondary | ICD-10-CM | POA: Diagnosis not present

## 2019-02-11 LAB — BASIC METABOLIC PANEL
Anion gap: 4 — ABNORMAL LOW (ref 5–15)
BUN: 22 mg/dL (ref 8–23)
CO2: 20 mmol/L — ABNORMAL LOW (ref 22–32)
Calcium: 8.3 mg/dL — ABNORMAL LOW (ref 8.9–10.3)
Chloride: 110 mmol/L (ref 98–111)
Creatinine, Ser: 1.26 mg/dL — ABNORMAL HIGH (ref 0.61–1.24)
GFR calc Af Amer: >60 mL/min (ref >60)
GFR calc non Af Amer: 55 mL/min — ABNORMAL LOW (ref >60)
Glucose, Bld: 109 mg/dL — ABNORMAL HIGH (ref 70–99)
Potassium: 4 mmol/L (ref 3.5–5.1)
Sodium: 134 mmol/L — ABNORMAL LOW (ref 135–145)

## 2019-02-11 LAB — CBC
HCT: 37.5 % — ABNORMAL LOW (ref 39.0–52.0)
Hemoglobin: 11.3 g/dL — ABNORMAL LOW (ref 13.0–17.0)
MCH: 27.6 pg (ref 26.0–34.0)
MCHC: 30.1 g/dL (ref 30.0–36.0)
MCV: 91.7 fL (ref 80.0–100.0)
Platelets: 268 10*3/uL (ref 150–400)
RBC: 4.09 MIL/uL — ABNORMAL LOW (ref 4.22–5.81)
RDW: 14.8 % (ref 11.5–15.5)
WBC: 10.4 10*3/uL (ref 4.0–10.5)
nRBC: 0 % (ref 0.0–0.2)

## 2019-02-11 LAB — URINE CULTURE

## 2019-02-11 LAB — HEMOGLOBIN A1C
Hgb A1c MFr Bld: 8.8 % — ABNORMAL HIGH (ref 4.8–5.6)
Mean Plasma Glucose: 205.86 mg/dL

## 2019-02-11 LAB — GLUCOSE, CAPILLARY
Glucose-Capillary: 116 mg/dL — ABNORMAL HIGH (ref 70–99)
Glucose-Capillary: 122 mg/dL — ABNORMAL HIGH (ref 70–99)
Glucose-Capillary: 216 mg/dL — ABNORMAL HIGH (ref 70–99)
Glucose-Capillary: 140 mg/dL — ABNORMAL HIGH (ref 70–99)

## 2019-02-11 NOTE — Progress Notes (Signed)
Suprapubic catheter has leaked throughout the night. No urine to empty in bag. Patient may benefit from urology consult. MD notified.

## 2019-02-11 NOTE — Progress Notes (Signed)
PROGRESS NOTE    Colin [name]  GBT:[id number] DOB: [birth year] DOA: 02/10/2019 PCP: Redmond School, MD     Brief Narrative:  78 y.o. male with medical history significant for diabetes mellitus, hypertension, hepatitis C, MRSA bacteremia, prostate cancer, chronic suprapubic catheter, CKD 3.  Presented to the ED with complaints of vomiting last night and this morning, and pain from his colostomy site.  Patient answers a few questions appropriately, and shows me his colostomy bag which has about 39mls of greenish fluid with small amount of stool, but he is not able to give a lot of details.  He denies pain at this time, no difficulty breathing.  ED Course: Vitals stable.  WBC 11.  Creatinine about baseline 1.6.  Lactic acid 2 >> 1.7.  Abdominal CT shows small bowel obstruction transition point at the level of the bowel containing. 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 Extensive postsurgical and postinflammatory changes in the deep pelvis at the site of prior prostatectomy with complicating necrosis.   Assessment & Plan: 1-partial SBO -Patient reports abdominal pain, vomiting prior to admission -CT scan has show transition point at the level of bowel containing peri-ostomy hernia in the left lower quadrant -After fluid resuscitation and antiemetics patient is feeling better -Has been seen by Dr. Constance Haw general surgery, recommendations have been given to slowly advance diet and continue symptomatic management. -Today no further episodes of nausea vomiting -No abdominal pain -Tolerated clear liquids overnight -We will continue advancing diet and follow clinical response -Hopefully discharge home soon.  2-type 2 diabetes mellitus with hyperglycemia -Continue sliding scale insulin -A1c 8.8 -Was not using any medications prior to admission. -Continue monitoring CBGs  3-essential hypertension -Patient using torsemide and Flomax as an outpatient -Vital signs are stable and  well-controlled -Will continue Flomax -Torsemide will remain on hold until fully able to tolerate diet and maintain adequate hydration.  4-recurrent prostate cancer, suprapubic catheter -Patient actively leaking around suprapubic catheter -Discussed with urology (Dr. Junious Silk) recommendations given to exchange suprapubic catheter and if leakage continue to use oxybutynin -Patient is otherwise asymptomatic and denies any pain.. -Continue Flomax.  5-chronic kidney disease stage III -Baseline creatinine 1.6-1.8 -Currently appears to be at baseline -Will continue holding torsemide while providing hydration and his ability to advance diet and maintain good oral intake.  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   DVT prophylaxis: SCD's Code Status: Full Family Communication: No family at bedside Disposition Plan: Slowly advance diet, continue to follow CBGs and uses sliding scale insulin.  Continue IV fluid and follow clinical response.  Hopefully discharge home if tolerating diet on 02/12/2019.  No surgery anticipated at this time.  Consultants:   General surgery  Procedures:   See below for x-ray reports  Antimicrobials:  Anti-infectives (From admission, onward)   None       Subjective: No fever, no chest pain or shortness of breath.  Patient denies abdominal pain, has continue passing gas and liquid stool into his ostomy bag.  No nausea or vomiting.  Objective: Vitals:   02/10/19 2000 02/10/19 2030 02/10/19 2104 02/11/19 0458  BP: 111/75 103/66 113/64 (!) 100/59  Pulse: 60 60 (!) 59 68  Resp: 15 10 16 18   Temp:   98.1 F (36.7 C) 98.2 F (  36.8 C)  TempSrc:   Oral Oral  SpO2: 100% 100% 94% 100%    Weight:   69.5 kg   Height:   5\' 7"  (1.702 m)     Intake/Output Summary (Last 24 hours) at 02/11/2019 1418 Last data filed at 02/11/2019 0952 Gross per 24 hour  Intake 1285.14 ml  Output 300 ml  Net 985.14 ml   Filed Weights   02/10/19 0928 02/10/19 2104  Weight: 79.4 kg 69.5 kg    Examination: General exam: Alert, awake, oriented x 3; in no acute distress; reports no chest pain, no nausea, no vomiting. Respiratory system: Clear to auscultation. Respiratory effort normal. Cardiovascular system:RRR. No rubs or gallops; no JVD. Gastrointestinal system: Abdomen is nondistended, soft and without guarding.  Left ostomy in place with cast and liquid stool inside ostomy bag.  Decreased but positive bowel sounds appreciated on exam.  Positive suprapubic catheter with surrounding leakage. Central nervous system: Alert and oriented. No focal neurological deficits. Extremities/skin: No cyanosis or clubbing.  Right lateral knee aspirate with dressing in place; no tenderness on palpation. Psychiatry: Judgement and insight appear normal. Mood & affect appropriate.     Data Reviewed: I have personally reviewed following labs and imaging studies  CBC: Recent Labs  Lab 02/10/19 1200 02/11/19 0522  WBC 11.0* 10.4  NEUTROABS 9.2*  --   HGB 11.6* 11.3*  HCT 36.8* 37.5*  MCV 87.2 91.7  PLT 309 119   Basic Metabolic Panel: Recent Labs  Lab 02/10/19 1200 02/11/19 0522  NA 137 134*  K 4.1 4.0  CL 108 110  CO2 17* 20*  GLUCOSE 308* 109*  BUN 25* 22  CREATININE 1.62* 1.26*  CALCIUM 8.6* 8.3*   GFR: Estimated Creatinine Clearance: 45.9 mL/min (A) (by C-G formula based on SCr of 1.26 mg/dL (H)).   Liver Function Tests: Recent Labs  Lab 02/10/19 1200  AST 18  ALT 21  ALKPHOS 145*  BILITOT 0.8  PROT 7.0  ALBUMIN 3.1*   Recent Labs  Lab 02/10/19 1200  LIPASE 39   Coagulation Profile: Recent Labs  Lab 02/10/19 1200  INR 1.1   HbA1C: Recent Labs    02/10/19 1316   HGBA1C 8.8*   CBG: Recent Labs  Lab 02/10/19 2211 02/11/19 0459 02/11/19 0724 02/11/19 1134  GLUCAP 289* 116* 122* 216*   Urine analysis:    Component Value Date/Time   COLORURINE AMBER (A) 02/10/2019 1125   APPEARANCEUR TURBID (A) 02/10/2019 1125   LABSPEC 1.011 02/10/2019 1125   PHURINE 9.0 (H) 02/10/2019 1125   GLUCOSEU NEGATIVE 02/10/2019 1125   HGBUR SMALL (A) 02/10/2019 1125   BILIRUBINUR NEGATIVE 02/10/2019 1125   KETONESUR NEGATIVE 02/10/2019 1125   PROTEINUR 100 (A) 02/10/2019 1125   UROBILINOGEN 0.2 03/09/2010 1027   NITRITE NEGATIVE 02/10/2019 1125   LEUKOCYTESUR MODERATE (A) 02/10/2019 1125    Recent Results (from the past 240 hour(s))  Urine culture     Status: None   Collection Time: 02/10/19 11:25 AM   Specimen: Urine, Random  Result Value Ref Range Status   Specimen Description   Final    URINE, RANDOM Performed at [facility], [address], [place], [place] [address]    Special Requests   Final    NONE Performed at [facility], [address] [place], [place] [address]    Culture   Final    Multiple bacterial morphotypes present, none predominant. Suggest appropriate recollection if clinically indicated.   Report Status 02/11/2019 FINAL  Final  SARS Coronavirus 2 [facility] order, Performed in [facility] hospital lab) Nasopharyngeal Nasopharyngeal Swab     Status: None   Collection Time: 02/10/19  2:54 PM   Specimen: Nasopharyngeal Swab  Result Value Ref Range Status   SARS Coronavirus 2 NEGATIVE NEGATIVE Final    Comment: (NOTE) If result is NEGATIVE SARS-CoV-2 target nucleic acids are NOT DETECTED. The SARS-CoV-2 RNA is generally detectable in upper and lower  respiratory specimens during the acute phase of infection. The lowest  concentration of SARS-CoV-2 viral copies this assay can detect is 250  copies / mL. A negative result does not preclude SARS-CoV-2 infection  and should not be used as the sole basis for treatment or other   patient management decisions.  A negative result may occur with  improper specimen collection / handling, submission of specimen other  than nasopharyngeal swab, presence of viral mutation(s) within the  areas targeted by this assay, and inadequate number of viral copies  (<250 copies / mL). A negative result must be combined with clinical  observations, patient history, and epidemiological information. If result is POSITIVE SARS-CoV-2 target nucleic acids are DETECTED. The SARS-CoV-2 RNA is generally detectable in upper and lower  respiratory specimens dur ing the acute phase of infection.  Positive  results are indicative of active infection with SARS-CoV-2.  Clinical  correlation with patient history and other diagnostic information is  necessary to determine patient infection status.  Positive results do  not rule out bacterial infection or co-infection with other viruses. If result is PRESUMPTIVE POSTIVE SARS-CoV-2 nucleic acids MAY BE PRESENT.   A presumptive positive result was obtained on the submitted specimen  and confirmed on repeat testing.  While 2019 novel coronavirus  (SARS-CoV-2) nucleic acids may be present in the submitted sample  additional confirmatory testing may be necessary for epidemiological  and / or clinical management purposes  to differentiate between  SARS-CoV-2 and other Sarbecovirus currently known to infect humans.  If clinically indicated additional testing with an alternate test  methodology [phone]) is advised. The SARS-CoV-2 RNA is generally  detectable in upper and lower respiratory sp ecimens during the acute  phase of infection. The expected result is Negative. Fact Sheet for Patients:  StrictlyIdeas.no Fact Sheet for Healthcare Providers: BankingDealers.co.za This test is not yet approved or cleared by the [place] FDA and has been authorized for detection and/or diagnosis of SARS-CoV-2  by FDA under an Emergency Use Authorization (EUA).  This EUA will remain in effect (meaning this test can be used) for the duration of the COVID-19 declaration under Section 564(b)(1) of the Act, 21 U.S.C. section 360bbb-3(b)(1), unless the authorization is terminated or revoked sooner. Performed at [facility], [address], [place], [place] [address]     Radiology Studies: Ct Abdomen Pelvis Wo Contrast  Result Date: 02/10/2019 CLINICAL DATA:  Abdominal pain, emesis, poor historian history of colostomy. EXAM: CT ABDOMEN AND PELVIS WITHOUT CONTRAST TECHNIQUE: Multidetector CT imaging of the abdomen and pelvis was performed following the standard protocol without IV contrast. COMPARISON:  CT pelvis 04/28/2017, CT abdomen pelvis 04/18/2017 FINDINGS: Lower chest: Atherosclerotic calcification of the coronary arteries. Calcification of the aortic leaflets. Normal heart size. No pericardial effusion. Hepatobiliary: No focal liver abnormality is seen. Several calcified gallstones without gallbladder wall thickening, or biliary dilatation. Pancreas: Diffuse pancreatic atrophy with a multitude coarse parenchymal calcifications. Spleen: Normal in size without focal abnormality. Adrenals/Urinary Tract: Normal adrenal glands. Mild bilateral nonspecific perinephric stranding, a nonspecific finding though may correlate  with either age or decreased renal function. Stable fluid attenuation cyst in the interpolar region right kidney. Nonobstructing calculus in the lower pole left kidney. No concerning renal masses. No hydronephrosis. Few nonobstructing calculi. Urinary bladder is circumferentially thickened and partially decompressed by inflated suprapubic catheter with some stranding along tubing tract. Stomach/Bowel: Distal esophagus stomach and duodenal sweep are normal. There is air and fluid dilation of several loops of small bowel in the mid abdomen with adjacent hazy mesenteric stranding at the level of a bowel  containing peristomal hernia in the left lower quadrant. The bowel contained within the hernia sac is more normal caliber but does demonstrate adjacent hazy stranding and the bowel exiting the hernia sac is largely decompressed. Much of the colon is decompressed as well to the level of the diverting colostomy and beyond. 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 Vascular/Lymphatic: Atherosclerotic plaque within the normal caliber aorta. Several hazy reactive appearing mid mesenteric lymph nodes are present. Reproductive: Extensive stranding and postsurgical changes prostatectomy bed including several coarse calcifications surgical material. Fluid is seen tracking into the left inguinal canal. 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 Fluid is seen tracking into the left inguinal canal. Additional trace free fluid is present within the peristomal hernia sac. No free gas. No other bowel containing hernia or abdominal wall defect. Injection posteriorly. Mild body wall edema. Musculoskeletal: Multilevel degenerative changes are present in the imaged portions of the spine. IMPRESSION: 1. 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 The bowel contained within the hernia sac is more normal caliber but does demonstrate adjacent hazy mesenteric stranding and reactive appearing mid mesenteric lymph nodes and reactive free fluid which raises concern for a developing closed loop obstruction contained within the sac itself. 2. Extensive postsurgical and postinflammatory changes in the deep pelvis at the site of prior prostatectomy with complicating necrosis. 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 Consider evaluation with urinalysis. 3. Free fluid in the pelvis tracking into the left inguinal canal. 4. Coarse calcifications of the pancreas may reflect sequela of chronic pancreatitis 5. Cholelithiasis without evidence of acute cholecystitis. 6. Aortic Atherosclerosis (ICD10-I70.0). These results were called by telephone at the time of interpretation on 02/10/2019 at 2:27 pm to Dr. Francine Graven , who verbally acknowledged these results. Electronically Signed   By: Lovena Le M.D.   On: 02/10/2019 14:30   Dg Chest 2 View  Result Date: 02/10/2019 CLINICAL DATA:  Vomiting last night and this morning. EXAM: CHEST - 2 VIEW COMPARISON:  PA and lateral chest 07/15/2017. FINDINGS: Lungs are clear. Heart size is normal. No pneumothorax or pleural fluid. Aortic atherosclerosis is noted. No acute or focal bony abnormality. IMPRESSION: No acute disease. Atherosclerosis. Electronically Signed   By: Inge Rise M.D.   On: 02/10/2019 14:00    Scheduled Meds:  insulin aspart  0-9 Units Subcutaneous Q6H   Continuous Infusions:  sodium chloride Stopped (02/10/19 2219)   sodium chloride 50 mL/hr at 02/11/19 0653     LOS: 0 days    Time spent: 30 minutes     Barton Dubois, MD [facility] Pager [phone]   02/11/2019, 2:18 PM

## 2019-02-11 NOTE — Care Management Obs Status (Signed)
[facility] NOTIFICATION   Patient Details  Name: Colin [name] MRN: [id number] Date of Birth: Sep 10, 1940   Medicare Observation Status Notification Given:  Yes    Boneta Lucks, RN 02/11/2019, 4:02 PM

## 2019-02-11 NOTE — Progress Notes (Signed)
I was present with the medical student for this service. I personally verified the history of present illness, performed the physical exam, and made the plan for this encounter. I have verified the medical student's documentation and made modifications where appropriately. I have personally documented in my own words a brief history, physical, and plan below.     Doing well. Eating and diet adv today.  No surgical intervention needed. Urology consult pending.  Curlene Labrum, MD [facility] [place], [place] [address] [phone] (office)    [facility] Progress Note     Subjective: This morning Colin [name] was feeling well and was tolerating clear liquids well. He said he drank everything that was brought to him by the nurses. He did not have any pain, nausea or vomiting or discomfort. He was making liquid stool in his colostomy bag. Patient has suprapubic catheter and UTI was suspected on examination because of putrid scent in room.   Objective: Vital signs in last 24 hours: Temp:  [98.1 F (36.7 C)-98.3 F (36.8 C)] 98.3 F (36.8 C) (08/06 1434) Pulse Rate:  [59-68] 63 (08/06 1434) Resp:  [10-18] 16 (08/06 1434) BP: (97-123)/(59-75) 97/68 (08/06 1434) SpO2:  [94 %-100 %] 100 % (08/06 1434) Weight:  [69.5 kg] 69.5 kg (08/05 2104) Last BM Date: 02/10/19  Intake/Output from previous day: 08/05 0701 - 08/06 0700 In: 1045.1 [P.O.:240; I.V.:805.1] Out: 300 [Stool:300] Intake/Output this shift: Total I/O In: 480 [P.O.:480] Out: -   Physical Exam:  General appearance: alert, cooperative and in no distress  Head: normocephalic, no obvious abnormalities  Resp: clear to auscultation bilaterally  Cardio: regular rate and rhythm, normal S1 normal S2, no mumurs  GI: no tenderness upon palpation, no distention, colostomy bag with bile colored fluid,  peristomal hernia noted around colostomy site which was soft and  reducible upon palpation, ostomy site pink with has no signs of obstruction, no erythema or color changes around ostomy  GU: suprapubic catheter in place   MSK: full ROM  Skin: chronic healing wound on lateral aspect of right lower extremity  Neuro: A&OX4 Psych: Normal affect    Lab Results:  Recent Labs    02/10/19 1200 02/11/19 0522  WBC 11.0* 10.4  HGB 11.6* 11.3*  HCT 36.8* 37.5*  PLT 309 268   BMET Recent Labs    02/10/19 1200 02/11/19 0522  NA 137 134*  K 4.1 4.0  CL 108 110  CO2 17* 20*  GLUCOSE 308* 109*  BUN 25* 22  CREATININE 1.62* 1.26*  CALCIUM 8.6* 8.3*   PT/INR Recent Labs    02/10/19 1200  LABPROT 14.5  INR 1.1    Studies/Results: Ct Abdomen Pelvis Wo Contrast  Result Date: 02/10/2019 CLINICAL DATA:  Abdominal pain, emesis, poor historian history of colostomy. EXAM: CT ABDOMEN AND PELVIS WITHOUT CONTRAST TECHNIQUE: Multidetector CT imaging of the abdomen and pelvis was performed following the standard protocol without IV contrast. COMPARISON:  CT pelvis 04/28/2017, CT abdomen pelvis 04/18/2017 FINDINGS: Lower chest: Atherosclerotic calcification of the coronary arteries. Calcification of the aortic leaflets. Normal heart size. No pericardial effusion. Hepatobiliary: No focal liver abnormality is seen. Several calcified gallstones without gallbladder wall thickening, or biliary dilatation. Pancreas: Diffuse pancreatic atrophy with a multitude coarse parenchymal calcifications. Spleen: Normal in size without focal abnormality. Adrenals/Urinary Tract: Normal adrenal glands. Mild bilateral nonspecific perinephric stranding, a nonspecific finding though may correlate with either age or decreased renal function. Stable fluid attenuation cyst in the  interpolar region right kidney. Nonobstructing calculus in the lower pole left kidney. No concerning renal masses. No hydronephrosis. Few nonobstructing calculi. Urinary bladder is circumferentially thickened and  partially decompressed by inflated suprapubic catheter with some stranding along tubing tract. Stomach/Bowel: Distal esophagus stomach and duodenal sweep are normal. 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 The bowel contained within the hernia sac is more normal caliber but does demonstrate adjacent hazy stranding and the bowel exiting the hernia sac is largely decompressed. Much of the colon is decompressed as well to the level of the diverting colostomy and beyond. 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 Vascular/Lymphatic: Atherosclerotic plaque within the normal caliber aorta. Several hazy reactive appearing mid mesenteric lymph nodes are present. Reproductive: Extensive stranding and postsurgical changes prostatectomy bed including several coarse calcifications surgical material. Fluid is seen tracking into the left inguinal canal. 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 Fluid is seen tracking into the left inguinal canal. Additional trace free fluid is present within the peristomal hernia sac. No free gas. No other bowel containing hernia or abdominal wall defect. Injection posteriorly. Mild body wall edema. Musculoskeletal: Multilevel degenerative changes are present in the imaged portions of the spine. IMPRESSION: 1. 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 The bowel contained within the hernia sac is more normal caliber but does demonstrate adjacent hazy mesenteric stranding and reactive appearing  mid mesenteric lymph nodes and reactive free fluid which raises concern for a developing closed loop obstruction contained within the sac itself. 2. Extensive postsurgical and postinflammatory changes in the deep pelvis at the site of prior prostatectomy with complicating necrosis. 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 Consider evaluation with urinalysis. 3. Free fluid in the pelvis tracking into the left inguinal canal. 4. Coarse calcifications of the pancreas may reflect sequela of chronic pancreatitis 5. Cholelithiasis without evidence of acute cholecystitis. 6. Aortic Atherosclerosis (ICD10-I70.0). These results were called by telephone at the time of interpretation on 02/10/2019 at 2:27 pm to Dr. Francine Graven , who verbally acknowledged these results. Electronically Signed   By: Lovena Le M.D.   On: 02/10/2019 14:30   Dg Chest 2 View  Result Date: 02/10/2019 CLINICAL DATA:  Vomiting last night and this morning. EXAM: CHEST - 2 VIEW COMPARISON:  PA and lateral chest 07/15/2017. FINDINGS: Lungs are clear. Heart size is normal. No pneumothorax or pleural fluid. Aortic atherosclerosis is noted. No acute or focal bony abnormality. IMPRESSION: No acute disease. Atherosclerosis. Electronically Signed   By: Inge Rise M.D.   On: 02/10/2019 14:00    Anti-infectives: Anti-infectives (From admission, onward)   None      Assessment/Plan: Colin [name] is a 78 yo male with medical history of diabetes, hypertension, hepatitis C, MRSA bacteremia, prostate cancer, chronic suprapubic catheter, CKD3 and a peristomal hernia who presented to the ED with vomiting and pain from his colostomy site. This morning Mr. Cregg was doing well and tolerating his clear liquid diet. His osteomy bag had fluid in it and was non obstructed.  Pt has paristomal hernia that was reducible. Pt had no distention, or  abdominal pain and abdomen soft upon palpation with no nausea. Lactate was no longer elevated at 1.7 and he was non-toxic appearing. Low likelyhood of obstruction at this time.  - advance diet as needed    LOS: 0 days    Mai-Tram Riquier] 02/11/2019

## 2019-02-11 NOTE — Progress Notes (Signed)
Patient has small hole like area to right knee, brown in color. Draining a moderate amount of purulent drainage. No odor. Per patient it has been draining since he had his last knee replacement. Area cleansed and foam applied.

## 2019-02-12 DIAGNOSIS — N400 Enlarged prostate without lower urinary tract symptoms: Secondary | ICD-10-CM | POA: Diagnosis not present

## 2019-02-12 DIAGNOSIS — K435 Parastomal hernia without obstruction or  gangrene: Secondary | ICD-10-CM | POA: Diagnosis not present

## 2019-02-12 DIAGNOSIS — N179 Acute kidney failure, unspecified: Secondary | ICD-10-CM | POA: Diagnosis not present

## 2019-02-12 DIAGNOSIS — C61 Malignant neoplasm of prostate: Secondary | ICD-10-CM | POA: Diagnosis not present

## 2019-02-12 DIAGNOSIS — R112 Nausea with vomiting, unspecified: Secondary | ICD-10-CM | POA: Diagnosis not present

## 2019-02-12 DIAGNOSIS — K56609 Unspecified intestinal obstruction, unspecified as to partial versus complete obstruction: Secondary | ICD-10-CM | POA: Diagnosis not present

## 2019-02-12 DIAGNOSIS — I10 Essential (primary) hypertension: Secondary | ICD-10-CM | POA: Diagnosis not present

## 2019-02-12 DIAGNOSIS — R1084 Generalized abdominal pain: Secondary | ICD-10-CM | POA: Diagnosis not present

## 2019-02-12 LAB — GLUCOSE, CAPILLARY
Glucose-Capillary: 191 mg/dL — ABNORMAL HIGH (ref 70–99)
Glucose-Capillary: 250 mg/dL — ABNORMAL HIGH (ref 70–99)
Glucose-Capillary: 96 mg/dL (ref 70–99)

## 2019-02-12 MED ORDER — TORSEMIDE 20 MG PO TABS: 40.0000 mg | ORAL_TABLET | Freq: Two times a day (BID) | ORAL | Status: AC

## 2019-02-12 MED ORDER — NAPROXEN SODIUM 220 MG PO TABS: 440.0000 mg | ORAL_TABLET | Freq: Every day | ORAL | Status: AC | PRN

## 2019-02-12 MED ORDER — GLIPIZIDE 5 MG PO TABS: 2.5000 mg | ORAL_TABLET | Freq: Every day | ORAL | 3 refills | Status: AC

## 2019-02-12 MED ORDER — PANTOPRAZOLE SODIUM 20 MG PO TBEC: 20.0000 mg | DELAYED_RELEASE_TABLET | Freq: Every day | ORAL | 1 refills | Status: AC

## 2019-02-12 NOTE — Discharge Summary (Signed)
Physician Discharge Summary  LANELL DUBIE JJH:[id number] DOB: August 20, 1940 DOA: 02/10/2019  PCP: Redmond School, MD  Admit date: 02/10/2019 Discharge date: 02/12/2019  Time spent: 35 minutes  Recommendations for Outpatient Follow-up:  1. Repeat basic metabolic panel to follow electrolytes and renal function 2. Close follow-up the patient CBGs and A1c with further adjustment on hypoglycemic medication as needed   Discharge Diagnoses:  Active Problems:   Essential hypertension   SBO (small bowel obstruction) (HCC)   Generalized abdominal pain   Nausea and vomiting in adult patient   Parastomal hernia without obstruction or gangrene   AKI (acute kidney injury) ([place])   Benign prostatic hyperplasia without lower urinary tract symptoms   Discharge Condition: Stable and improved.  Discharged home with instruction to follow-up with PCP and urology as an outpatient.  Diet recommendation: Heart healthy diet.  Filed Weights   02/10/19 0928 02/10/19 2104  Weight: 79.4 kg 69.5 kg    History of present illness:  As per H&P written by Dr. Denton Brick on 02/10/19 78 y.o.malewith medical history significant fordiabetes mellitus, hypertension, hepatitis C, MRSA bacteremia, prostate cancer, chronic suprapubic catheter, CKD 3.Presented to the ED with complaints of vomiting last night and this morning,and pain from his colostomy site. Patient answers a few questions appropriately,and shows me his colostomy bag which has about25mlsof greenish fluid with small amount of stool, buthe isnot able to give a lot of details. He denies pain at this time,no difficulty breathing.  ED Course:Vitals stable. WBC 11. Creatinine about baseline 1.6. Lactic acid 2 >> 1.7.Abdominal CT shows small bowel obstruction transition point at the level of the bowel containing. Peristomal hernia in the left lower quadrant.Extensive postsurgical and postinflammatory changes in the deep pelvis at the site of prior  prostatectomy with complicating necrosis.  Hospital Course:  1-partial SBO -Patient reports abdominal pain, vomiting prior to admission -CT scan has show transition point at the level of bowel containing peri-ostomy hernia in the left lower quadrant -After fluid resuscitation and antiemetics patient is feeling better -Has been seen by Dr. Constance Haw general surgery, recommendations have been given to slowly advance diet and continue symptomatic management. -Today no further episodes of nausea, vomiting or abdominal pain -Patient tolerating diet without any problems -Reducible parastomal hernia, air and fecal material inside ostomy bag. -No surgical intervention required [place] hospitalization.  2-type 2 diabetes mellitus with hyperglycemia -Sliding scale insulin was used while inpatient -A1c 8.8 -Was not using any medications prior to admission. -Patient discharged on glipizide daily -Advised to monitor his sugar at home and to follow modified carbohydrate diet.  3-essential hypertension -Patient using torsemide and Flomax as an outpatient -Vital signs are stable and well-controlled -Will continue home antihypertensive regimen -Advised to maintain adequate hydration and to follow heart healthy diet.  4-recurrent prostate cancer, suprapubic catheter -Patient actively leaking around suprapubic catheter -Discussed with urology (Dr. Junious Silk) recommendations given to exchange suprapubic catheter and if leakage continue to start oxybutynin. -Patient's catheter was exchanged and he will follow-up with urology service as an outpatient -Abnormalities were seen on repeat CT scan and further decision regarding treatment will be decided on his follow-up visit. -Patient is asymptomatic and denies any pain.. -will continue Flomax.  5-chronic kidney disease stage III -Baseline creatinine 1.6-1.8 -Currently appears to be at baseline -Will and advise patient to maintain adequate hydration.   -Creatinine at discharge 1.26.  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 -There is no signs of superimposed infection appreciated on exam.   -Continue daily dressing changes.    Procedures:  See below for x-ray reports  Consultations:  General surgery   Discharge Exam: Vitals:   02/11/19 2050 02/12/19 0500  BP: 110/61 108/68  Pulse: 66 64  Resp: 16 16  Temp: 98.5 F (36.9 C) 98.4 F (36.9 C)  SpO2: 99% 100%    General: Afebrile, no chest pain, no shortness of breath, no nausea, no vomiting, no abdominal pain. Cardiovascular: S1-S2, no rubs, no gallops Respiratory: Clear to auscultation bilaterally. Abdomen: Nondistended, soft, without guarding.  Left ostomy with parastomal hernia (reducible) in place.  Ostomy bag with fecal material inside.  Positive suprapubic catheter. Extremities: No cyanosis or clubbing.  Right lateral knee wound with dressing in place; no tenderness on palpation.  Serosanguineous drainage appreciated.  Discharge Instructions   Discharge Instructions    Diet - low sodium heart healthy   Complete by: As directed    Discharge instructions   Complete by: As directed    Maintain adequate hydration Take medications as prescribed Arrange follow-up with PCP in 10 days Make sure to follow-up with urology service as instructed.     Allergies as of 02/12/2019   No Known Allergies     Medication List    TAKE these medications   ferrous sulfate 325 (65 FE) MG tablet Take 325 mg by mouth 3 (three) times daily with meals.   glipiZIDE 5 MG tablet Commonly known as: Glucotrol Take 0.5 tablets (2.5 mg total) by mouth daily.   multivitamin with minerals tablet Take 1 tablet by mouth daily.   naproxen sodium 220 MG tablet Commonly known as: ALEVE Take 2 tablets (440 mg total) by mouth  daily as needed (Severe pain). What changed:   how much to take  reasons to take this   pantoprazole 20 MG tablet Commonly known as: Protonix Take 1 tablet (20 mg total) by mouth daily.   tamsulosin 0.4 MG Caps capsule Commonly known as: FLOMAX Take 0.4 mg by mouth daily.   torsemide 20 MG tablet Commonly known as: DEMADEX Take 2 tablets (40 mg total) by mouth 2 (two) times daily. What changed: when to take this      No Known Allergies Follow-up Information    Redmond School, MD. Schedule an appointment as soon as possible for a visit in 10 day(s).   Specialty: Internal Medicine Contact information: [address] [place] [address] [phone]           The results of significant diagnostics from this hospitalization (including imaging, microbiology, ancillary and laboratory) are listed below for reference.    Significant Diagnostic Studies: Ct Abdomen Pelvis Wo Contrast  Result Date: 02/10/2019 CLINICAL DATA:  Abdominal pain, emesis, poor historian history of colostomy. EXAM: CT ABDOMEN AND PELVIS WITHOUT CONTRAST TECHNIQUE: Multidetector CT imaging of the abdomen and pelvis was performed following the standard protocol without IV contrast. COMPARISON:  CT pelvis 04/28/2017, CT abdomen pelvis 04/18/2017 FINDINGS: Lower chest: Atherosclerotic calcification of the coronary arteries. Calcification of the aortic leaflets. Normal heart size. No pericardial effusion. Hepatobiliary: No focal liver abnormality is seen. Several calcified gallstones without gallbladder wall thickening, or biliary dilatation. Pancreas: Diffuse pancreatic atrophy with a multitude coarse parenchymal calcifications. Spleen: Normal in size without focal abnormality. Adrenals/Urinary Tract: Normal adrenal glands. Mild bilateral nonspecific perinephric stranding, a nonspecific finding though may correlate with either age or decreased renal function. Stable  fluid attenuation cyst in the interpolar  region right kidney. Nonobstructing calculus in the lower pole left kidney. No concerning renal masses. No hydronephrosis. Few nonobstructing calculi. Urinary bladder is circumferentially thickened and partially decompressed by inflated suprapubic catheter with some stranding along tubing tract. Stomach/Bowel: Distal esophagus stomach and duodenal sweep are normal. 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 The bowel contained within the hernia sac is more normal caliber but does demonstrate adjacent hazy stranding and the bowel exiting the hernia sac is largely decompressed. Much of the colon is decompressed as well to the level of the diverting colostomy and beyond. 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 Vascular/Lymphatic: Atherosclerotic plaque within the normal caliber aorta. Several hazy reactive appearing mid mesenteric lymph nodes are present. Reproductive: Extensive stranding and postsurgical changes prostatectomy bed including several coarse calcifications surgical material. Fluid is seen tracking into the left inguinal canal. 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 Fluid is seen tracking into the left inguinal canal. Additional trace free fluid is present within the peristomal hernia sac. No free gas. No other bowel containing hernia or abdominal wall defect. Injection posteriorly. Mild body wall edema. Musculoskeletal: Multilevel degenerative changes are present in the imaged portions of the spine. IMPRESSION: 1. Small-bowel obstruction with transition point at the level of a bowel  containing peristomal hernia in the left lower quadrant. The bowel contained within the hernia sac is more normal caliber but does demonstrate adjacent hazy mesenteric stranding and reactive appearing mid mesenteric lymph nodes and reactive free fluid which raises concern for a developing closed loop obstruction contained within the sac itself. 2. Extensive postsurgical and postinflammatory changes in the deep pelvis at the site of prior prostatectomy with complicating necrosis. 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 Consider evaluation with urinalysis. 3. Free fluid in the pelvis tracking into the left inguinal canal. 4. Coarse calcifications of the pancreas may reflect sequela of chronic pancreatitis 5. Cholelithiasis without evidence of acute cholecystitis. 6. Aortic Atherosclerosis (ICD10-I70.0). These results were called by telephone at the time of interpretation on 02/10/2019 at 2:27 pm to Dr. Francine Graven , who verbally acknowledged these results. Electronically Signed   By: Lovena Le M.D.   On: 02/10/2019 14:30   Dg Chest 2 View  Result Date: 02/10/2019 CLINICAL DATA:  Vomiting last night and this morning. EXAM: CHEST - 2 VIEW COMPARISON:  PA and lateral chest 07/15/2017. FINDINGS: Lungs are clear. Heart size is normal. No pneumothorax or pleural fluid. Aortic atherosclerosis is noted. No acute or focal bony abnormality. IMPRESSION: No acute disease. Atherosclerosis. Electronically Signed   By: Inge Rise M.D.   On: 02/10/2019 14:00    Microbiology: Recent Results (from the past 240 hour(s))  Urine culture     Status: None   Collection Time: 02/10/19 11:25 AM   Specimen: Urine, Random  Result Value Ref Range Status   Specimen Description   Final    URINE, RANDOM Performed at [facility], [address], [place], [place] [address]    Special Requests   Final     NONE Performed at  [facility], [address] [place], [place] [address]    Culture   Final    Multiple bacterial morphotypes present, none predominant. Suggest appropriate recollection if clinically indicated.   Report Status 02/11/2019 FINAL  Final  SARS Coronavirus 2 [facility] order, Performed in [facility] hospital lab) Nasopharyngeal Nasopharyngeal Swab     Status: None   Collection Time: 02/10/19  2:54 PM   Specimen: Nasopharyngeal Swab  Result Value Ref Range Status   SARS Coronavirus 2 NEGATIVE NEGATIVE Final    Comment: (NOTE) If result is NEGATIVE SARS-CoV-2 target nucleic acids are NOT DETECTED. The SARS-CoV-2 RNA is generally detectable in upper and lower  respiratory specimens during the acute phase of infection. The lowest  concentration of SARS-CoV-2 viral copies this assay can detect is 250  copies / mL. A negative result does not preclude SARS-CoV-2 infection  and should not be used as the sole basis for treatment or other  patient management decisions.  A negative result may occur with  improper specimen collection / handling, submission of specimen other  than nasopharyngeal swab, presence of viral mutation(s) within the  areas targeted by this assay, and inadequate number of viral copies  (<250 copies / mL). A negative result must be combined with clinical  observations, patient history, and epidemiological information. If result is POSITIVE SARS-CoV-2 target nucleic acids are DETECTED. The SARS-CoV-2 RNA is generally detectable in upper and lower  respiratory specimens dur ing the acute phase of infection.  Positive  results are indicative of active infection with SARS-CoV-2.  Clinical  correlation with patient history and other diagnostic information is  necessary to determine patient infection status.  Positive results do  not rule out bacterial infection or co-infection with other viruses. If result is PRESUMPTIVE POSTIVE SARS-CoV-2 nucleic acids MAY BE  PRESENT.   A presumptive positive result was obtained on the submitted specimen  and confirmed on repeat testing.  While 2019 novel coronavirus  (SARS-CoV-2) nucleic acids may be present in the submitted sample  additional confirmatory testing may be necessary for epidemiological  and / or clinical management purposes  to differentiate between  SARS-CoV-2 and other Sarbecovirus currently known to infect humans.  If clinically indicated additional testing with an alternate test  methodology [phone]) is advised. The SARS-CoV-2 RNA is generally  detectable in upper and lower respiratory sp ecimens during the acute  phase of infection. The expected result is Negative. Fact Sheet for Patients:  StrictlyIdeas.no Fact Sheet for Healthcare Providers: BankingDealers.co.za This test is not yet approved or cleared by the [place] FDA and has been authorized for detection and/or diagnosis of SARS-CoV-2 by FDA under an Emergency Use Authorization (EUA).  This EUA will remain in effect (meaning this test can be used) for the duration of the COVID-19 declaration under Section 564(b)(1) of the Act, 21 U.S.C. section 360bbb-3(b)(1), unless the authorization is terminated or revoked sooner. Performed at [facility], [address] [place], [place] [address]      Labs: Basic Metabolic Panel: Recent Labs  Lab 02/10/19 1200 02/11/19 0522  NA 137 134*  K 4.1 4.0  CL 108 110  CO2 17* 20*  GLUCOSE 308* 109*  BUN 25* 22  CREATININE 1.62* 1.26*  CALCIUM 8.6* 8.3*   Liver Function Tests: Recent Labs  Lab 02/10/19 1200  AST 18  ALT 21  ALKPHOS 145*  BILITOT 0.8  PROT 7.0  ALBUMIN 3.1*   Recent Labs  Lab 02/10/19 1200  LIPASE 39  CBC: Recent Labs  Lab 02/10/19 1200 02/11/19 0522  WBC 11.0* 10.4  NEUTROABS 9.2*  --   HGB 11.6* 11.3*  HCT 36.8* 37.5*  MCV 87.2 91.7  PLT 309 268    CBG: Recent Labs  Lab 02/11/19 1134  02/11/19 1613 02/11/19 2359 02/12/19 0458 02/12/19 1139  GLUCAP 216* 140* 191* 96 250*    Signed:  Barton Dubois MD.  [facility] 02/12/2019, 12:42 PM

## 2019-02-12 NOTE — Progress Notes (Signed)
Nsg Discharge Note  Admit Date:  02/10/2019 Discharge date: 02/12/2019   Colin [name] to be D/C'd Home per MD order.  AVS completed.   Patient able to verbalize understanding.  Discharge Medication: Allergies as of 02/12/2019   No Known Allergies     Medication List    TAKE these medications   ferrous sulfate 325 (65 FE) MG tablet Take 325 mg by mouth 3 (three) times daily with meals.   glipiZIDE 5 MG tablet Commonly known as: Glucotrol Take 0.5 tablets (2.5 mg total) by mouth daily.   multivitamin with minerals tablet Take 1 tablet by mouth daily.   naproxen sodium 220 MG tablet Commonly known as: ALEVE Take 2 tablets (440 mg total) by mouth daily as needed (Severe pain). What changed:   how much to take  reasons to take this   pantoprazole 20 MG tablet Commonly known as: Protonix Take 1 tablet (20 mg total) by mouth daily.   tamsulosin 0.4 MG Caps capsule Commonly known as: FLOMAX Take 0.4 mg by mouth daily.   torsemide 20 MG tablet Commonly known as: DEMADEX Take 2 tablets (40 mg total) by mouth 2 (two) times daily. What changed: when to take this       Discharge Assessment: Vitals:   02/11/19 2050 02/12/19 0500  BP: 110/61 108/68  Pulse: 66 64  Resp: 16 16  Temp: 98.5 F (36.9 C) 98.4 F (36.9 C)  SpO2: 99% 100%   Skin clean, dry and intact without evidence of skin break down, no evidence of skin tears noted. IV catheter discontinued intact. Site without signs and symptoms of complications - no redness or edema noted at insertion site, patient denies c/o pain - only slight tenderness at site.  Dressing with slight pressure applied.  D/c Instructions-Education: Discharge instructions given to patient with verbalized understanding. D/c education completed with patient including follow up instructions, medication list, d/c activities limitations if indicated, with other d/c instructions as indicated by MD - patient able to verbalize understanding, all  questions fully answered. Patient instructed to return to ED, call 911, or call MD for any changes in condition.  Patient escorted via [place], and D/C home via private auto.  [name] Loletha Grayer, RN 02/12/2019 2:20 PM

## 2019-02-17 DIAGNOSIS — I10 Essential (primary) hypertension: Secondary | ICD-10-CM | POA: Diagnosis not present

## 2019-02-17 DIAGNOSIS — E1159 Type 2 diabetes mellitus with other circulatory complications: Secondary | ICD-10-CM | POA: Diagnosis not present

## 2019-02-17 DIAGNOSIS — N400 Enlarged prostate without lower urinary tract symptoms: Secondary | ICD-10-CM | POA: Diagnosis not present

## 2019-02-17 DIAGNOSIS — K56609 Unspecified intestinal obstruction, unspecified as to partial versus complete obstruction: Secondary | ICD-10-CM | POA: Diagnosis not present

## 2019-02-17 DIAGNOSIS — Z6823 Body mass index (BMI) 23.0-23.9, adult: Secondary | ICD-10-CM | POA: Diagnosis not present

## 2019-03-08 DIAGNOSIS — C61 Malignant neoplasm of prostate: Secondary | ICD-10-CM | POA: Diagnosis not present

## 2019-03-08 DIAGNOSIS — E119 Type 2 diabetes mellitus without complications: Secondary | ICD-10-CM | POA: Diagnosis not present

## 2019-03-08 DIAGNOSIS — G894 Chronic pain syndrome: Secondary | ICD-10-CM | POA: Diagnosis not present

## 2019-03-08 DIAGNOSIS — N183 Chronic kidney disease, stage 3 (moderate): Secondary | ICD-10-CM | POA: Diagnosis not present

## 2019-06-08 ENCOUNTER — Ambulatory Visit: Payer: Medicare HMO | Admitting: Urology

## 2019-08-15 ENCOUNTER — Emergency Department (HOSPITAL_COMMUNITY): Payer: Medicare Other

## 2019-08-15 ENCOUNTER — Inpatient Hospital Stay (HOSPITAL_COMMUNITY)
Admission: EM | Admit: 2019-08-15 | Discharge: 2019-09-06 | DRG: 871 | Disposition: E | Payer: Medicare Other | Attending: Critical Care Medicine | Admitting: Critical Care Medicine

## 2019-08-15 DIAGNOSIS — N1832 Chronic kidney disease, stage 3b: Secondary | ICD-10-CM | POA: Diagnosis not present

## 2019-08-15 DIAGNOSIS — Z9079 Acquired absence of other genital organ(s): Secondary | ICD-10-CM | POA: Diagnosis not present

## 2019-08-15 DIAGNOSIS — R64 Cachexia: Secondary | ICD-10-CM | POA: Diagnosis present

## 2019-08-15 DIAGNOSIS — M25461 Effusion, right knee: Secondary | ICD-10-CM | POA: Diagnosis not present

## 2019-08-15 DIAGNOSIS — L03115 Cellulitis of right lower limb: Secondary | ICD-10-CM | POA: Diagnosis present

## 2019-08-15 DIAGNOSIS — Z96653 Presence of artificial knee joint, bilateral: Secondary | ICD-10-CM | POA: Diagnosis not present

## 2019-08-15 DIAGNOSIS — Z9841 Cataract extraction status, right eye: Secondary | ICD-10-CM

## 2019-08-15 DIAGNOSIS — R404 Transient alteration of awareness: Secondary | ICD-10-CM | POA: Diagnosis not present

## 2019-08-15 DIAGNOSIS — Z9842 Cataract extraction status, left eye: Secondary | ICD-10-CM

## 2019-08-15 DIAGNOSIS — K8681 Exocrine pancreatic insufficiency: Secondary | ICD-10-CM | POA: Diagnosis present

## 2019-08-15 DIAGNOSIS — E1110 Type 2 diabetes mellitus with ketoacidosis without coma: Secondary | ICD-10-CM | POA: Diagnosis present

## 2019-08-15 DIAGNOSIS — Z961 Presence of intraocular lens: Secondary | ICD-10-CM | POA: Diagnosis present

## 2019-08-15 DIAGNOSIS — Z20822 Contact with and (suspected) exposure to covid-19: Secondary | ICD-10-CM | POA: Diagnosis not present

## 2019-08-15 DIAGNOSIS — F1011 Alcohol abuse, in remission: Secondary | ICD-10-CM | POA: Diagnosis present

## 2019-08-15 DIAGNOSIS — R6521 Severe sepsis with septic shock: Secondary | ICD-10-CM | POA: Diagnosis not present

## 2019-08-15 DIAGNOSIS — E43 Unspecified severe protein-calorie malnutrition: Secondary | ICD-10-CM | POA: Diagnosis not present

## 2019-08-15 DIAGNOSIS — L89896 Pressure-induced deep tissue damage of other site: Secondary | ICD-10-CM | POA: Diagnosis present

## 2019-08-15 DIAGNOSIS — K739 Chronic hepatitis, unspecified: Secondary | ICD-10-CM | POA: Diagnosis not present

## 2019-08-15 DIAGNOSIS — Z79899 Other long term (current) drug therapy: Secondary | ICD-10-CM

## 2019-08-15 DIAGNOSIS — R296 Repeated falls: Secondary | ICD-10-CM | POA: Diagnosis not present

## 2019-08-15 DIAGNOSIS — Z4659 Encounter for fitting and adjustment of other gastrointestinal appliance and device: Secondary | ICD-10-CM

## 2019-08-15 DIAGNOSIS — Z515 Encounter for palliative care: Secondary | ICD-10-CM | POA: Diagnosis not present

## 2019-08-15 DIAGNOSIS — G9340 Encephalopathy, unspecified: Secondary | ICD-10-CM

## 2019-08-15 DIAGNOSIS — T68XXXA Hypothermia, initial encounter: Secondary | ICD-10-CM

## 2019-08-15 DIAGNOSIS — J9601 Acute respiratory failure with hypoxia: Secondary | ICD-10-CM

## 2019-08-15 DIAGNOSIS — I129 Hypertensive chronic kidney disease with stage 1 through stage 4 chronic kidney disease, or unspecified chronic kidney disease: Secondary | ICD-10-CM | POA: Diagnosis present

## 2019-08-15 DIAGNOSIS — K8020 Calculus of gallbladder without cholecystitis without obstruction: Secondary | ICD-10-CM | POA: Diagnosis not present

## 2019-08-15 DIAGNOSIS — Z681 Body mass index (BMI) 19 or less, adult: Secondary | ICD-10-CM | POA: Diagnosis not present

## 2019-08-15 DIAGNOSIS — Z933 Colostomy status: Secondary | ICD-10-CM | POA: Diagnosis not present

## 2019-08-15 DIAGNOSIS — N210 Calculus in bladder: Secondary | ICD-10-CM | POA: Diagnosis present

## 2019-08-15 DIAGNOSIS — E1122 Type 2 diabetes mellitus with diabetic chronic kidney disease: Secondary | ICD-10-CM | POA: Diagnosis not present

## 2019-08-15 DIAGNOSIS — G92 Toxic encephalopathy: Secondary | ICD-10-CM | POA: Diagnosis present

## 2019-08-15 DIAGNOSIS — Z8546 Personal history of malignant neoplasm of prostate: Secondary | ICD-10-CM

## 2019-08-15 DIAGNOSIS — N179 Acute kidney failure, unspecified: Secondary | ICD-10-CM

## 2019-08-15 DIAGNOSIS — L89121 Pressure ulcer of left upper back, stage 1: Secondary | ICD-10-CM | POA: Diagnosis present

## 2019-08-15 DIAGNOSIS — E876 Hypokalemia: Secondary | ICD-10-CM | POA: Diagnosis not present

## 2019-08-15 DIAGNOSIS — Z66 Do not resuscitate: Secondary | ICD-10-CM | POA: Diagnosis not present

## 2019-08-15 DIAGNOSIS — E1165 Type 2 diabetes mellitus with hyperglycemia: Secondary | ICD-10-CM | POA: Diagnosis not present

## 2019-08-15 DIAGNOSIS — Z87891 Personal history of nicotine dependence: Secondary | ICD-10-CM

## 2019-08-15 DIAGNOSIS — H9190 Unspecified hearing loss, unspecified ear: Secondary | ICD-10-CM | POA: Diagnosis present

## 2019-08-15 DIAGNOSIS — A419 Sepsis, unspecified organism: Secondary | ICD-10-CM

## 2019-08-15 DIAGNOSIS — R4182 Altered mental status, unspecified: Secondary | ICD-10-CM

## 2019-08-15 DIAGNOSIS — E861 Hypovolemia: Secondary | ICD-10-CM | POA: Diagnosis present

## 2019-08-15 DIAGNOSIS — L8990 Pressure ulcer of unspecified site, unspecified stage: Secondary | ICD-10-CM | POA: Insufficient documentation

## 2019-08-15 DIAGNOSIS — L8941 Pressure ulcer of contiguous site of back, buttock and hip, stage 1: Secondary | ICD-10-CM | POA: Diagnosis not present

## 2019-08-15 DIAGNOSIS — K861 Other chronic pancreatitis: Secondary | ICD-10-CM | POA: Diagnosis not present

## 2019-08-15 DIAGNOSIS — Z9359 Other cystostomy status: Secondary | ICD-10-CM

## 2019-08-15 DIAGNOSIS — L89151 Pressure ulcer of sacral region, stage 1: Secondary | ICD-10-CM | POA: Diagnosis present

## 2019-08-15 DIAGNOSIS — K922 Gastrointestinal hemorrhage, unspecified: Secondary | ICD-10-CM

## 2019-08-15 DIAGNOSIS — K2971 Gastritis, unspecified, with bleeding: Secondary | ICD-10-CM | POA: Diagnosis present

## 2019-08-15 DIAGNOSIS — R627 Adult failure to thrive: Secondary | ICD-10-CM | POA: Diagnosis present

## 2019-08-15 DIAGNOSIS — E870 Hyperosmolality and hypernatremia: Secondary | ICD-10-CM

## 2019-08-15 DIAGNOSIS — L539 Erythematous condition, unspecified: Secondary | ICD-10-CM

## 2019-08-15 DIAGNOSIS — G9389 Other specified disorders of brain: Secondary | ICD-10-CM | POA: Diagnosis not present

## 2019-08-15 DIAGNOSIS — Z7984 Long term (current) use of oral hypoglycemic drugs: Secondary | ICD-10-CM

## 2019-08-15 DIAGNOSIS — R000 Tachycardia, unspecified: Secondary | ICD-10-CM | POA: Diagnosis not present

## 2019-08-15 DIAGNOSIS — I10 Essential (primary) hypertension: Secondary | ICD-10-CM | POA: Diagnosis not present

## 2019-08-15 DIAGNOSIS — N183 Chronic kidney disease, stage 3 unspecified: Secondary | ICD-10-CM | POA: Diagnosis not present

## 2019-08-15 DIAGNOSIS — R29718 NIHSS score 18: Secondary | ICD-10-CM | POA: Diagnosis present

## 2019-08-15 DIAGNOSIS — Z4682 Encounter for fitting and adjustment of non-vascular catheter: Secondary | ICD-10-CM | POA: Diagnosis not present

## 2019-08-15 DIAGNOSIS — R29818 Other symptoms and signs involving the nervous system: Secondary | ICD-10-CM | POA: Diagnosis not present

## 2019-08-15 DIAGNOSIS — R0689 Other abnormalities of breathing: Secondary | ICD-10-CM | POA: Diagnosis not present

## 2019-08-15 DIAGNOSIS — I959 Hypotension, unspecified: Secondary | ICD-10-CM | POA: Diagnosis not present

## 2019-08-15 LAB — POC OCCULT BLOOD, ED: Fecal Occult Bld: POSITIVE — AB

## 2019-08-15 LAB — RESPIRATORY PANEL BY RT PCR (FLU A&B, COVID)
Influenza A by PCR: NEGATIVE
Influenza B by PCR: NEGATIVE
SARS Coronavirus 2 by RT PCR: NEGATIVE

## 2019-08-15 LAB — BLOOD GAS, ARTERIAL
Acid-base deficit: 25.9 mmol/L — ABNORMAL HIGH (ref 0.0–2.0)
Bicarbonate: 6.7 mmol/L — ABNORMAL LOW (ref 20.0–28.0)
FIO2: 100
O2 Saturation: 99 %
Patient temperature: 37
pCO2 arterial: 11.3 mmHg — CL (ref 32.0–48.0)
pH, Arterial: 7.056 — CL (ref 7.350–7.450)
pO2, Arterial: 291 mmHg — ABNORMAL HIGH (ref 83.0–108.0)

## 2019-08-15 LAB — CBC WITH DIFFERENTIAL/PLATELET
Abs Immature Granulocytes: 0.64 10*3/uL — ABNORMAL HIGH (ref 0.00–0.07)
Basophils Absolute: 0.1 10*3/uL (ref 0.0–0.1)
Basophils Relative: 0 %
Eosinophils Absolute: 0.5 10*3/uL (ref 0.0–0.5)
Eosinophils Relative: 2 %
HCT: 41.3 % (ref 39.0–52.0)
Hemoglobin: 13.1 g/dL (ref 13.0–17.0)
Immature Granulocytes: 2 %
Lymphocytes Relative: 4 %
Lymphs Abs: 1.2 10*3/uL (ref 0.7–4.0)
MCH: 28.5 pg (ref 26.0–34.0)
MCHC: 31.7 g/dL (ref 30.0–36.0)
MCV: 89.8 fL (ref 80.0–100.0)
Monocytes Absolute: 1.9 10*3/uL — ABNORMAL HIGH (ref 0.1–1.0)
Monocytes Relative: 6 %
Neutro Abs: 25.4 10*3/uL — ABNORMAL HIGH (ref 1.7–7.7)
Neutrophils Relative %: 86 %
Platelets: 476 10*3/uL — ABNORMAL HIGH (ref 150–400)
RBC: 4.6 MIL/uL (ref 4.22–5.81)
RDW: 19 % — ABNORMAL HIGH (ref 11.5–15.5)
WBC: 29.8 10*3/uL — ABNORMAL HIGH (ref 4.0–10.5)
nRBC: 0.2 % (ref 0.0–0.2)

## 2019-08-15 LAB — LACTIC ACID, PLASMA: Lactic Acid, Venous: 3.4 mmol/L (ref 0.5–1.9)

## 2019-08-15 LAB — COMPREHENSIVE METABOLIC PANEL
ALT: 20 U/L (ref 0–44)
AST: 22 U/L (ref 15–41)
Albumin: 2.8 g/dL — ABNORMAL LOW (ref 3.5–5.0)
Alkaline Phosphatase: 153 U/L — ABNORMAL HIGH (ref 38–126)
BUN: 90 mg/dL — ABNORMAL HIGH (ref 8–23)
CO2: <7 mmol/L — ABNORMAL LOW (ref 22–32)
Calcium: 8.4 mg/dL — ABNORMAL LOW (ref 8.9–10.3)
Chloride: 129 mmol/L — ABNORMAL HIGH (ref 98–111)
Creatinine, Ser: 5.15 mg/dL — ABNORMAL HIGH (ref 0.61–1.24)
GFR calc Af Amer: 12 mL/min — ABNORMAL LOW (ref >60)
GFR calc non Af Amer: 10 mL/min — ABNORMAL LOW (ref >60)
Glucose, Bld: 171 mg/dL — ABNORMAL HIGH (ref 70–99)
Potassium: 4 mmol/L (ref 3.5–5.1)
Sodium: 150 mmol/L — ABNORMAL HIGH (ref 135–145)
Total Bilirubin: 0.8 mg/dL (ref 0.3–1.2)
Total Protein: 6.3 g/dL — ABNORMAL LOW (ref 6.5–8.1)

## 2019-08-15 LAB — CBG MONITORING, ED: Glucose-Capillary: 85 mg/dL (ref 70–99)

## 2019-08-15 MED ORDER — VANCOMYCIN HCL IN DEXTROSE 1-5 GM/200ML-% IV SOLN
1000.0000 mg | Freq: Once | INTRAVENOUS | Status: AC
  Administered 2019-08-15: 1000 mg via INTRAVENOUS
  Filled 2019-08-15: qty 200

## 2019-08-15 MED ORDER — METRONIDAZOLE IN NACL 5-0.79 MG/ML-% IV SOLN
500.0000 mg | Freq: Once | INTRAVENOUS | Status: AC
  Administered 2019-08-15: 500 mg via INTRAVENOUS
  Filled 2019-08-15: qty 100

## 2019-08-15 MED ORDER — SODIUM CHLORIDE 0.9 % IV SOLN
80.0000 mg | Freq: Once | INTRAVENOUS | Status: AC
  Administered 2019-08-15: 80 mg via INTRAVENOUS
  Filled 2019-08-15: qty 80

## 2019-08-15 MED ORDER — LACTATED RINGERS IV BOLUS (SEPSIS)
250.0000 mL | Freq: Once | INTRAVENOUS | Status: AC
  Administered 2019-08-15: 22:00:00 250 mL via INTRAVENOUS

## 2019-08-15 MED ORDER — NOREPINEPHRINE BITARTRATE 1 MG/ML IV SOLN: 10.0000 ug/kg/min | Freq: Once | INTRAVENOUS | Status: DC

## 2019-08-15 MED ORDER — SODIUM CHLORIDE 0.9 % IV SOLN
1.0000 g | INTRAVENOUS | Status: DC
  Administered 2019-08-16 – 2019-08-17 (×2): 1 g via INTRAVENOUS
  Filled 2019-08-15 (×3): qty 1

## 2019-08-15 MED ORDER — SODIUM BICARBONATE 8.4 % IV SOLN
INTRAVENOUS | Status: AC
  Filled 2019-08-15: qty 150

## 2019-08-15 MED ORDER — NOREPINEPHRINE 4 MG/250ML-% IV SOLN
0.0000 ug/min | INTRAVENOUS | Status: DC
  Administered 2019-08-15: 2 ug/min via INTRAVENOUS
  Administered 2019-08-16: 12:00:00 7 ug/min via INTRAVENOUS
  Filled 2019-08-15 (×2): qty 250

## 2019-08-15 MED ORDER — LACTATED RINGERS IV BOLUS (SEPSIS)
1000.0000 mL | Freq: Once | INTRAVENOUS | Status: AC
  Administered 2019-08-15: 1000 mL via INTRAVENOUS

## 2019-08-15 MED ORDER — SODIUM CHLORIDE 0.9 % IV SOLN
2.0000 g | Freq: Once | INTRAVENOUS | Status: AC
  Administered 2019-08-15: 2 g via INTRAVENOUS
  Filled 2019-08-15: qty 2

## 2019-08-15 MED ORDER — VANCOMYCIN HCL 750 MG/150ML IV SOLN: 750.0000 mg | INTRAVENOUS | Status: DC

## 2019-08-15 MED ORDER — PANTOPRAZOLE SODIUM 40 MG IV SOLR
INTRAVENOUS | Status: AC
  Filled 2019-08-15: qty 80

## 2019-08-15 MED ORDER — STERILE WATER FOR INJECTION IV SOLN
Freq: Once | INTRAVENOUS | Status: AC
  Filled 2019-08-15: qty 850

## 2019-08-15 MED ORDER — LACTATED RINGERS IV BOLUS (SEPSIS)
500.0000 mL | Freq: Once | INTRAVENOUS | Status: AC
  Administered 2019-08-15: 22:00:00 500 mL via INTRAVENOUS

## 2019-08-15 NOTE — Progress Notes (Signed)
Pharmacy Antibiotic Note  Colin [name] is a 79 y.o. male admitted on 09/01/2019 with sepsis.  Pharmacy has been consulted for Vancomycin and cefepime dosing.  Plan: Vancomycin 1000mg  IV loading dose, then 750mg  IV every 48 hours.  Goal trough 15-20 mcg/mL.  Cefepime 2gm IV loading dose, then 1gm IV q24h F/U cxs and clinical progress Monitor V/S, labs and levels as indicated  Height: 5\' 10"  (177.8 cm) Weight: 121 lb 4.1 oz (55 kg) IBW/kg (Calculated) : 73  Temp (24hrs), Avg:89.9 F (32.2 C), Min:89.9 F (32.2 C), Max:89.9 F (32.2 C)  Recent Labs  Lab 08/30/2019 2021  WBC 29.8*  CREATININE 5.15*    Estimated Creatinine Clearance: 9.3 mL/min (A) (by C-G formula based on SCr of 5.15 mg/dL (H)).    No Known Allergies  Antimicrobials this admission: Cefepime 2/7 >>  Vancomycin 2/7 >>   Dose adjustments this admission: prn  Microbiology results: 2/7 BCx: pending 2/7 UCx: pending  MRSA PCR:   Thank you for allowing pharmacy to be a part of this patient's care.  Isac Sarna, BS [place], [place] Clinical Pharmacist Pager [phone] 08/11/2019 9:40 PM

## 2019-08-15 NOTE — ED Notes (Signed)
Called ac for protonix
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
## 2019-08-15 NOTE — ED Notes (Signed)
Delay in IV abx due to lack of access

## 2019-08-15 NOTE — ED Notes (Signed)
US Bladder scan by edp revealed no urine

## 2019-08-15 NOTE — Progress Notes (Signed)
eLink Physician-Brief Progress Note Patient Name: Colin [name] DOB: [birth year] MRN: [id number]   Date of Service  08/11/2019  HPI/Events of Note  Called by carelink regarding this patient who is at [facility] ED with what appears to be septic shock c/b both renal failure and severe acidosis. He has received antibiotics and is currently intubated and on levophed.   eICU Interventions  Given acuity of the patient's illness I agree that transfer to [facility] ICU (once patient is deemed stable enough to transport by the referring physician) is appropriate. Accepting physician will be Dr. Gilford Raid.     Intervention Category Minor Interventions: Communication with other healthcare providers and/or family  MOIR BANEY 08/26/2019, 10:54 PM

## 2019-08-15 NOTE — ED Notes (Signed)
CXR in progress  

## 2019-08-15 NOTE — ED Notes (Signed)
Date and time results received: 09/03/2019 11:02 PM (use smartphrase ".now" to insert current time)  Test: lactic Critical Value: 3.4  Name of Provider Notified: wentz  Orders Received? Or Actions Taken?:

## 2019-08-15 NOTE — ED Notes (Signed)
CVC placed by EDP due to lack of IV access

## 2019-08-15 NOTE — ED Provider Notes (Signed)
[facility] EMERGENCY DEPARTMENT Provider Note   CSN: [id number] Arrival date & time: 08/24/2019  1958     History Chief Complaint  Patient presents with  . Code Sepsis    Colin [name] is a 79 y.o. male.  HPI Patient presents by EMS, they suspect that he has sepsis.  They were called to his home for altered mental status.  Patient was found covered in dried feces.  He has a suprapubic catheter.  Patient's son was there in the home.  Reportedly, the patient lives alone.  He is unable to give any history.  Level 5 caveat-altered mental status    Past Medical History:  Diagnosis Date  . Arthritis   . Bilateral lower extremity edema   . Chronic venous insufficiency   . CKD (chronic kidney disease), stage III ([place])   . History of alcohol abuse    none since 2013 per pt  . History of chronic hepatitis    Genotype 1, FO/F1-- treated w/ harvoni (completed May 2016)  . History of external beam radiation therapy    2007 prostate  . History of intravenous drug use in remission    per pt in remission since 1980's  . HOH (hard of hearing)   . Hypertension   . Normocytic anemia   . Recurrent prostate cancer [facility]) urologist-  dr Junious Silk   dx 2007  s/p  external beam radioation therapy/ recurrent 12-2015  Gleason 4+4  . Type 2 diabetes, diet controlled ([place])    no meds since 2010 approx    Patient Active Problem List   Diagnosis Date Noted  . Sepsis ([place]) 08/18/2019  . AKI (acute kidney injury) ([place])   . Benign prostatic hyperplasia without lower urinary tract symptoms   . Generalized abdominal pain   . Nausea and vomiting in adult patient   . Parastomal hernia without obstruction or gangrene   . SBO (small bowel obstruction) ([place]) 02/10/2019  . Recurrent falls 05/07/2017  . Full code status 05/07/2017  . Subdural hematoma ([place]) 05/06/2017  . Blood in stool   . Diarrhea in adult patient   . Heme positive stool   . Perineal abscess   . Recto-prostatic fistula   . MRSA  bacteremia   . Staphylococcus aureus bacteremia with sepsis ([place])   . Severe sepsis with septic shock ([place])   . Acute renal failure ([place])   . Metabolic acidosis   . Melena   . Altered mental status 04/18/2017  . 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 Normocytic anemia 05/16/2014  . Colon cancer screening 01/14/2014  . Lymphedema 02/25/2013  . Leg edema, right 05/04/2012  . Varicose veins of lower extremities with other complications 96/22/2979  . Hepatitis C 08/28/2011  . Essential hypertension 01/10/2010  . NEOPLASM, MALIGNANT, PROSTATE, HX OF 01/10/2010  . DIABETES MELLITUS, TYPE II 01/04/2010  . 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    Past Surgical History:  Procedure Laterality Date  . CATARACT EXTRACTION W/PHACO Left 12/14/2012   Procedure: CATARACT EXTRACTION PHACO AND INTRAOCULAR LENS PLACEMENT (IOC);  Surgeon: Tonny Branch, MD;  Location: [facility];  Service: Ophthalmology;  Laterality: Left;  CDE: 13.74  . CATARACT EXTRACTION W/PHACO Right 12/28/2012   Procedure: CATARACT EXTRACTION PHACO AND INTRAOCULAR LENS PLACEMENT (IOC);  Surgeon: Tonny Branch, MD;  Location: [facility];  Service: Ophthalmology;  Laterality: Right;  CDE:12.80  . COLONOSCOPY N/A 02/14/2014   GXQ:JJHERDEY colonic polyps-removed as described above Colonic diverticulosis (tubular adenoma)  . CRYOABLATION N/A 05/24/2016  Procedure: CRYO ABLATION PROSTATE;  Surgeon: Matthew Eskridge, MD;  Location: [facility];  Service: Urology;  Laterality: N/A;  . ENDOVENOUS ABLATION SAPHENOUS VEIN W/ LASER  2013   Right leg X 2 by Dr. Hilty  . ESOPHAGOGASTRODUODENOSCOPY N/A 02/14/2014   RMR:Single large pedunculated gastric polyp (hyperplastic)  . INCISION AND DRAINAGE OF PERITONSILLAR ABCESS  04/24/2017   Procedure: INCISION AND DRAINAGE OF PERINEUM ABCESS;  Surgeon: Dahlstedt, Stephen, MD;  Location: [facility];  Service: Urology;;  . INSERTION OF SUPRAPUBIC CATHETER N/A 04/24/2017   Procedure: OPEN INSERTION OF  SUPRAPUBIC CATHETER;  Surgeon: Dahlstedt, Stephen, MD;  Location: [facility];  Service: Urology;  Laterality: N/A;  . IR CATHETER TUBE CHANGE  01/01/2018  . IR FLUORO RM 30-60 MIN  09/09/2017  . LAPAROSCOPIC DIVERTED COLOSTOMY N/A 04/24/2017   Procedure: LAPAROSCOPIC SIGMOID COLOSTOMY;  Surgeon: White, Christopher M, MD;  Location: [facility];  Service: General;  Laterality: N/A;  . REIMPLANTATION OF TOTAL KNEE Left 03/16/2010  . TONSILLECTOMY  child  . TOTAL KNEE  PROSTHESIS REMOVAL W/ SPACER INSERTION Left 12/29/2009  . TOTAL KNEE ARTHROPLASTY Bilateral left 2007/   right 2005 approx       Family History  Problem Relation Age of Onset  . Colon cancer Neg Hx   . Liver disease Neg Hx     Social History   Tobacco Use  . Smoking status: Former Smoker    Years: 12.00    Quit date: 05/04/1972    Years since quitting: 47.3  . Smokeless tobacco: Never Used  Substance Use Topics  . Alcohol use: No    Alcohol/week: 0.0 standard drinks    Comment: hx alcohol abuse-- per pt  none since 2013.  . Drug use: No    Comment: Previous IV drug user ovr 30 yrs ago--  per pt quit 1980's    Home Medications Prior to Admission medications   Medication Sig Start Date End Date Taking? Authorizing Provider  ferrous sulfate 325 (65 FE) MG tablet Take 325 mg by mouth 3 (three) times daily with meals.    [provider]  glipiZIDE (GLUCOTROL) 5 MG tablet Take 0.5 tablets (2.5 mg total) by mouth daily. 02/12/19 02/12/20  Madera, Carlos, MD  Multiple Vitamins-Minerals (MULTIVITAMIN WITH MINERALS) tablet Take 1 tablet by mouth daily.      [provider]  naproxen sodium (ALEVE) 220 MG tablet Take 2 tablets (440 mg total) by mouth daily as needed (Severe pain). 02/12/19   Madera, Carlos, MD  pantoprazole (PROTONIX) 20 MG tablet Take 1 tablet (20 mg total) by mouth daily. 02/12/19 02/12/20  Madera, Carlos, MD  tamsulosin (FLOMAX) 0.4 MG CAPS capsule Take 0.4 mg by mouth daily. 06/16/17   [provider]  torsemide (DEMADEX) 20 MG tablet Take 2 tablets (40 mg total) by mouth 2 (two) times daily. 02/12/19   Madera, Carlos, MD    Allergies    Patient has no known allergies.  Review of Systems   Review of Systems  Unable to perform ROS: Mental status change    Physical Exam Updated Vital Signs BP 111/69   Pulse 78   Temp (!) 88.7 F (31.5 C) (Rectal)   Resp 17   Ht 5' 10" (1.778 m)   Wt 55 kg   SpO2 100%   BMI 17.40 kg/m   Physical Exam Vitals and nursing note reviewed.  Constitutional:      General: He is in acute distress.     Appearance:   He is well-developed. He is ill-appearing and toxic-appearing.     Comments: He is emaciated  HENT:     Head: Normocephalic and atraumatic.     Right Ear: External ear normal.     Left Ear: External ear normal.     Mouth/Throat:     Mouth: Mucous membranes are dry.  Eyes:     Extraocular Movements: Extraocular movements intact.     Conjunctiva/sclera: Conjunctivae normal.     Pupils: Pupils are equal, round, and reactive to light.  Neck:     Trachea: Phonation normal.  Cardiovascular:     Rate and Rhythm: Normal rate and regular rhythm.     Pulses: Normal pulses.  Pulmonary:     Effort: No respiratory distress.     Breath sounds: No stridor.  Abdominal:     General: There is no distension.     Palpations: Abdomen is soft.     Tenderness: There is no abdominal tenderness.     Comments: Suprapubic catheter in midline, dry feces around lower abdomen and around catheter ostomy.  Colostomy, left lower quadrant, does not have a device to collect stool in it.  The ostomy otherwise appears normal.  After cleansing an ostomy bag was placed.  Musculoskeletal:        General: Normal range of motion.     Cervical back: Normal range of motion and neck supple.  Skin:    General: Skin is warm and dry.  Neurological:     Mental Status: He is alert.     Cranial Nerves: No cranial nerve deficit.     Motor: No abnormal muscle tone.      Comments: Eyes open, seems responsive to environment.  Does not speak, only groans.  Arms flexed at elbows, with mild spasticity.  Lower extremities extended.  Psychiatric:     Comments: Grounds, responsive.     ED Results / Procedures / Treatments   Labs (all labs ordered are listed, but only abnormal results are displayed) Labs Reviewed  COMPREHENSIVE METABOLIC PANEL - Abnormal; Notable for the following components:      Result Value   Sodium 150 (*)    Chloride 129 (*)    CO2 <7 (*)    Glucose, Bld 171 (*)    BUN 90 (*)    Creatinine, Ser 5.15 (*)    Calcium 8.4 (*)    Total Protein 6.3 (*)    Albumin 2.8 (*)    Alkaline Phosphatase 153 (*)    GFR calc non Af Amer 10 (*)    GFR calc Af Amer 12 (*)    All other components within normal limits  CBC WITH DIFFERENTIAL/PLATELET - Abnormal; Notable for the following components:   WBC 29.8 (*)    RDW 19.0 (*)    Platelets 476 (*)    Neutro Abs 25.4 (*)    Monocytes Absolute 1.9 (*)    Abs Immature Granulocytes 0.64 (*)    All other components within normal limits  BLOOD GAS, ARTERIAL - Abnormal; Notable for the following components:   pH, Arterial 7.056 (*)    pCO2 arterial 11.3 (*)    pO2, Arterial 291 (*)    Bicarbonate 6.7 (*)    Acid-base deficit 25.9 (*)    Allens test (pass/fail) NOT INDICATED (*)    All other components within normal limits  LACTIC ACID, PLASMA - Abnormal; Notable for the following components:   Lactic Acid, Venous 3.4 (*)    All other components   within normal limits  POC OCCULT BLOOD, ED - Abnormal; Notable for the following components:   Fecal Occult Bld POSITIVE (*)    All other components within normal limits  RESPIRATORY PANEL BY RT PCR (FLU A&B, COVID)  CULTURE, BLOOD (ROUTINE X 2)  CULTURE, BLOOD (ROUTINE X 2)  URINE CULTURE  URINALYSIS, ROUTINE W REFLEX MICROSCOPIC  LACTIC ACID, PLASMA  CBG MONITORING, ED    EKG EKG Interpretation  Date/Time:  Sunday August 15 2019 20:28:54 EST  Ventricular Rate:  71 PR Interval:    QRS Duration: 160 QT Interval:  489 QTC Calculation: 532 R Axis:   53 Text Interpretation: Sinus rhythm Left bundle branch block Since last tracing Left bundle branch block is new Otherwise normal ECG Confirmed by Daleen Bo [phone]) on 08/29/2019 10:08:34 PM   Radiology DG Chest Port 1 View  Result Date: 08/28/2019 CLINICAL DATA:  Sepsis EXAM: PORTABLE CHEST 1 VIEW COMPARISON:  February 10, 2019 FINDINGS: The heart size and mediastinal contours are within normal limits. Aortic knob calcifications. A right-sided PICC seen with the tip at the superior cavoatrial junction. No pneumothorax or large airspace consolidation. No acute osseous abnormality. IMPRESSION: Right-sided PICC with the tip at the superior cavoatrial junction. Electronically Signed   By: Prudencio Pair M.D.   On: 08/10/2019 21:32    Procedures .Critical Care Performed by: Daleen Bo, MD Authorized by: Daleen Bo, MD   Critical care provider statement:    Critical care time (minutes):  105   Critical care start time:  08/27/2019 7:55 PM   Critical care end time:  08/18/2019 10:31 PM   Critical care time was exclusive of:  Separately billable procedures and treating other patients   Critical care was necessary to treat or prevent imminent or life-threatening deterioration of the following conditions:  Sepsis   Critical care was time spent personally by me on the following activities:  Blood draw for specimens, development of treatment plan with patient or surrogate, discussions with consultants, evaluation of patient's response to treatment, examination of patient, obtaining history from patient or surrogate, ordering and performing treatments and interventions, ordering and review of laboratory studies, pulse oximetry, re-evaluation of patient's condition, review of old charts and ordering and review of radiographic studies   (including critical care time)  Medications Ordered in ED  Medications  vancomycin (VANCOCIN) IVPB 1000 mg/200 mL premix (1,000 mg Intravenous New Bag/Given (Non-Interop) 08/27/2019 2246)  norepinephrine (LEVOPHED) 30m in 2579mpremix infusion (5 mcg/min Intravenous Rate/Dose Change 08/30/2019 2303)  vancomycin (VANCOREADY) IVPB 750 mg/150 mL (has no administration in time range)  ceFEPIme (MAXIPIME) 1 g in sodium chloride 0.9 % 100 mL IVPB (has no administration in time range)  pantoprazole (PROTONIX) 80 mg in sodium chloride 0.9 % 100 mL IVPB (has no administration in time range)  sodium bicarbonate 150 mEq in sterile water 1,000 mL infusion (has no administration in time range)  lactated ringers bolus 1,000 mL (0 mLs Intravenous Stopped 09/01/2019 2249)    And  lactated ringers bolus 500 mL (0 mLs Intravenous Stopped 08/25/2019 2249)    And  lactated ringers bolus 250 mL (0 mLs Intravenous Stopped 08/20/2019 2249)  metroNIDAZOLE (FLAGYL) IVPB 500 mg (0 mg Intravenous Stopped 09/03/2019 2250)  ceFEPIme (MAXIPIME) 2 g in sodium chloride 0.9 % 100 mL IVPB (0 g Intravenous Stopped 08/29/2019 2249)    ED Course  I have reviewed the triage vital signs and the nursing notes.  Pertinent labs & imaging results that were available during  my care of the patient were reviewed by me and considered in my medical decision making (see chart for details).  Clinical Course as of Aug 14 2324  Sun Aug 15, 2019  2128 Normal  Respiratory Panel by RT PCR (Flu A&B, Covid) - Nasopharyngeal Swab [EW]  2205 Normal except sodium high, chloride high, CO2 low, glucose high, BUN high, creatinine high, calcium low, total protein low, albumin low, alk phos stays high, GFR low  Comprehensive metabolic panel(!) [EW]  9528 Abnormal, pH low, PCO2 low, acid-base high  Blood gas, arterial(!!) [EW]  2206 Normal except white count high, platelets high, neutrophils high  CBC WITH DIFFERENTIAL(!) [EW]  2207 No CHF or infiltrate, no pneumothorax, subclavian central line, at the distal SVT/atrial junction  DG  Chest Port 1 View [EW]  2210 He remains hypotensive but is talking better.  Will order additional liter of LR.  Patient appears to be in acute renal failure.  No overt signs of infection at this time.  He has been started on broad-spectrum antibiotics.   [EW]  2214 Blood pressure now 97/59 on Levophed drip.  Bladder scan done by me, urinary bladder is present, with likely about 15 cc of urine in it.  At this time the ostomy is draining dark material that is Hemoccult positive.  Protonix ordered.   [EW]  2241 Case discussed with pulmonary critical care, who accepts patient in transfer to [facility], admitting doctor, [place].   [EW]    Clinical Course User Index [EW] Daleen Bo, MD   MDM Rules/Calculators/A&P                       Patient Vitals for the past 24 hrs:  BP Temp Temp src Pulse Resp SpO2 Height Weight  08/21/2019 2325 111/69 - - 78 17 100 % - -  08/28/2019 2320 113/65 - - 79 20 100 % - -  09/01/2019 2315 (!) 109/51 - - 78 20 100 % - -  08/21/2019 2305 (!) 106/59 - - 79 20 100 % - -  08/30/2019 2256 (!) 143/113 - - 77 20 100 % - -  08/21/2019 2250 97/63 - - 76 17 100 % - -  09/04/2019 2245 (!) 103/46 - - 75 17 100 % - -  09/03/2019 2242 119/86 - - 76 20 100 % - -  08/16/2019 2226 123/69 - - 75 18 100 % - -  08/20/2019 2221 - (!) 88.7 F (31.5 C) Rectal 77 19 100 % - -  08/10/2019 2220 (!) 97/59 - - 78 20 100 % - -  08/29/2019 2219 - - - 77 19 100 % - -  09/02/2019 2218 111/76 - - 78 (!) 26 100 % - -  08/12/2019 2217 - - - 81 19 100 % - -  08/30/2019 2216 - - - 78 (!) 28 100 % - -  08/13/2019 2215 (!) 50/37 - - 72 (!) 22 100 % - -  08/14/2019 2201 (!) 49/18 - - 68 (!) 23 100 % - -  08/17/2019 2145 - - - 68 (!) 21 97 % - -  09/02/2019 2140 - - - 71 (!) 23 98 % - -  08/16/2019 2131 (!) 74/52 - - - - - - -  08/22/2019 2130 (!) 40/18 - - (!) 45 18 (!) 85 % - -  09/03/2019 2127 - - - 73 20 100 % - -  08/13/2019 2100 - - - - 15 - - -  08/21/2019 2051 - (!) 89.9 F (32.2 C) Rectal - - - - -  09/02/2019 2011 - - -  - - - 5' 10" (1.778 m) 55 kg  08/20/2019 2000 - - - - - - 5' 10" (1.778 m) 55 kg     Medical Decision Making: Elderly patient who lives alone, has had gradual worsening symptoms over at least 2 days, and is found to be severely dehydrated, hypotensive, hypothermic.  He appears to be in acute renal failure, and likely very volume depleted.  No overt signs for infection.  Patient required central line access, vasopressors, and was treated with broad-spectrum antibiotics.  He was placed on a warming blanket for severe hypothermia.  After fluid resuscitation begun, patient began to produce fluid in his ostomy, which was dark in color and Hemoccult positive.  Discussions with patient's son, indicates that the patient is full code although in the past he has been DNR.  Colin [name] was evaluated in Emergency Department on 08/17/2019 for the symptoms described in the history of present illness. He was evaluated in the context of the global COVID-19 pandemic, which necessitated consideration that the patient might be at risk for infection with the SARS-CoV-2 virus that causes COVID-19. Institutional protocols and algorithms that pertain to the evaluation of patients at risk for COVID-19 are in a state of rapid change based on information released by regulatory bodies including the CDC and federal and state organizations. These policies and algorithms were followed during the patient's care in the ED.  CRITICAL CARE-yes Performed by: Daleen Bo   Nursing Notes Reviewed/ Care Coordinated Applicable Imaging Reviewed Interpretation of Laboratory Data incorporated into ED treatment  10:31 PM-requested callback from intensivist service to admit patient.  Discussed with Dr. Gillermina Phy, he agrees to admit patient at [facility].  Admission orders placed.  Patient to be transferred when available transport truck.    Plan: Admit   Final Clinical Impression(s) / ED Diagnoses Final diagnoses:  Altered mental  status, unspecified altered mental status type  Acute renal failure, unspecified acute renal failure type ([place])  Gastrointestinal hemorrhage, unspecified gastrointestinal hemorrhage type  Hypernatremia  Sepsis with acute renal failure and septic shock, due to unspecified organism, unspecified acute renal failure type ([place])  Hypothermia, initial encounter    Rx / DC Orders ED Discharge Orders    None       Daleen Bo, MD 08/26/2019 2327

## 2019-08-16 ENCOUNTER — Inpatient Hospital Stay (HOSPITAL_COMMUNITY): Payer: Medicare Other

## 2019-08-16 DIAGNOSIS — R4182 Altered mental status, unspecified: Secondary | ICD-10-CM

## 2019-08-16 DIAGNOSIS — N179 Acute kidney failure, unspecified: Secondary | ICD-10-CM

## 2019-08-16 DIAGNOSIS — T68XXXA Hypothermia, initial encounter: Secondary | ICD-10-CM

## 2019-08-16 DIAGNOSIS — E870 Hyperosmolality and hypernatremia: Secondary | ICD-10-CM

## 2019-08-16 DIAGNOSIS — G9340 Encephalopathy, unspecified: Secondary | ICD-10-CM

## 2019-08-16 DIAGNOSIS — Z933 Colostomy status: Secondary | ICD-10-CM

## 2019-08-16 DIAGNOSIS — R6521 Severe sepsis with septic shock: Secondary | ICD-10-CM

## 2019-08-16 DIAGNOSIS — L8941 Pressure ulcer of contiguous site of back, buttock and hip, stage 1: Secondary | ICD-10-CM

## 2019-08-16 DIAGNOSIS — K922 Gastrointestinal hemorrhage, unspecified: Secondary | ICD-10-CM

## 2019-08-16 DIAGNOSIS — M25461 Effusion, right knee: Secondary | ICD-10-CM

## 2019-08-16 DIAGNOSIS — Z9359 Other cystostomy status: Secondary | ICD-10-CM

## 2019-08-16 DIAGNOSIS — L8990 Pressure ulcer of unspecified site, unspecified stage: Secondary | ICD-10-CM | POA: Insufficient documentation

## 2019-08-16 DIAGNOSIS — A419 Sepsis, unspecified organism: Principal | ICD-10-CM

## 2019-08-16 DIAGNOSIS — Z9079 Acquired absence of other genital organ(s): Secondary | ICD-10-CM

## 2019-08-16 LAB — BASIC METABOLIC PANEL
Anion gap: 18 — ABNORMAL HIGH (ref 5–15)
Anion gap: 19 — ABNORMAL HIGH (ref 5–15)
Anion gap: 14 (ref 5–15)
BUN: 77 mg/dL — ABNORMAL HIGH (ref 8–23)
BUN: 75 mg/dL — ABNORMAL HIGH (ref 8–23)
BUN: 75 mg/dL — ABNORMAL HIGH (ref 8–23)
CO2: 8 mmol/L — ABNORMAL LOW (ref 22–32)
CO2: 9 mmol/L — ABNORMAL LOW (ref 22–32)
CO2: 14 mmol/L — ABNORMAL LOW (ref 22–32)
Calcium: 6.8 mg/dL — ABNORMAL LOW (ref 8.9–10.3)
Calcium: 6.7 mg/dL — ABNORMAL LOW (ref 8.9–10.3)
Calcium: 6.4 mg/dL — CL (ref 8.9–10.3)
Chloride: 126 mmol/L — ABNORMAL HIGH (ref 98–111)
Chloride: 125 mmol/L — ABNORMAL HIGH (ref 98–111)
Chloride: 120 mmol/L — ABNORMAL HIGH (ref 98–111)
Creatinine, Ser: 4.51 mg/dL — ABNORMAL HIGH (ref 0.61–1.24)
Creatinine, Ser: 4.48 mg/dL — ABNORMAL HIGH (ref 0.61–1.24)
Creatinine, Ser: 4.32 mg/dL — ABNORMAL HIGH (ref 0.61–1.24)
GFR calc Af Amer: 14 mL/min — ABNORMAL LOW (ref >60)
GFR calc Af Amer: 14 mL/min — ABNORMAL LOW (ref >60)
GFR calc Af Amer: 14 mL/min — ABNORMAL LOW (ref >60)
GFR calc non Af Amer: 12 mL/min — ABNORMAL LOW (ref >60)
GFR calc non Af Amer: 12 mL/min — ABNORMAL LOW (ref >60)
GFR calc non Af Amer: 12 mL/min — ABNORMAL LOW (ref >60)
Glucose, Bld: 158 mg/dL — ABNORMAL HIGH (ref 70–99)
Glucose, Bld: 247 mg/dL — ABNORMAL HIGH (ref 70–99)
Glucose, Bld: 291 mg/dL — ABNORMAL HIGH (ref 70–99)
Potassium: 3.5 mmol/L (ref 3.5–5.1)
Potassium: 3 mmol/L — ABNORMAL LOW (ref 3.5–5.1)
Potassium: 2.7 mmol/L — CL (ref 3.5–5.1)
Sodium: 152 mmol/L — ABNORMAL HIGH (ref 135–145)
Sodium: 153 mmol/L — ABNORMAL HIGH (ref 135–145)
Sodium: 148 mmol/L — ABNORMAL HIGH (ref 135–145)

## 2019-08-16 LAB — MAGNESIUM
Magnesium: 2.4 mg/dL (ref 1.7–2.4)
Magnesium: 1.7 mg/dL (ref 1.7–2.4)

## 2019-08-16 LAB — POCT I-STAT 7, (LYTES, BLD GAS, ICA,H+H)
Acid-base deficit: 24 mmol/L — ABNORMAL HIGH (ref 0.0–2.0)
Acid-base deficit: 18 mmol/L — ABNORMAL HIGH (ref 0.0–2.0)
Bicarbonate: 4.4 mmol/L — ABNORMAL LOW (ref 20.0–28.0)
Bicarbonate: 7.1 mmol/L — ABNORMAL LOW (ref 20.0–28.0)
Calcium, Ion: 1.23 mmol/L (ref 1.15–1.40)
Calcium, Ion: 1.1 mmol/L — ABNORMAL LOW (ref 1.15–1.40)
HCT: 39 % (ref 39.0–52.0)
HCT: 30 % — ABNORMAL LOW (ref 39.0–52.0)
Hemoglobin: 13.3 g/dL (ref 13.0–17.0)
Hemoglobin: 10.2 g/dL — ABNORMAL LOW (ref 13.0–17.0)
O2 Saturation: 93 %
O2 Saturation: 93 %
Patient temperature: 89
Patient temperature: 94
Potassium: 3.4 mmol/L — ABNORMAL LOW (ref 3.5–5.1)
Potassium: 2.7 mmol/L — CL (ref 3.5–5.1)
Sodium: 153 mmol/L — ABNORMAL HIGH (ref 135–145)
Sodium: 155 mmol/L — ABNORMAL HIGH (ref 135–145)
TCO2: <5 mmol/L — ABNORMAL LOW (ref 22–32)
TCO2: 8 mmol/L — ABNORMAL LOW (ref 22–32)
pCO2 arterial: <15 mmHg — CL (ref 32.0–48.0)
pCO2 arterial: <15 mmHg — CL (ref 32.0–48.0)
pH, Arterial: 7.129 — CL (ref 7.350–7.450)
pH, Arterial: 7.273 — ABNORMAL LOW (ref 7.350–7.450)
pO2, Arterial: 67 mmHg — ABNORMAL LOW (ref 83.0–108.0)
pO2, Arterial: 66 mmHg — ABNORMAL LOW (ref 83.0–108.0)

## 2019-08-16 LAB — OSMOLALITY: Osmolality: 344 mOsm/kg (ref 275–295)

## 2019-08-16 LAB — BLOOD GAS, ARTERIAL
Acid-base deficit: 18.3 mmol/L — ABNORMAL HIGH (ref 0.0–2.0)
Bicarbonate: 6.7 mmol/L — ABNORMAL LOW (ref 20.0–28.0)
FIO2: 36
O2 Saturation: 98.3 %
Patient temperature: 37.1
pCO2 arterial: <19 mmHg — CL (ref 32.0–48.0)
pH, Arterial: 7.364 (ref 7.350–7.450)
pO2, Arterial: 110 mmHg — ABNORMAL HIGH (ref 83.0–108.0)

## 2019-08-16 LAB — COMPREHENSIVE METABOLIC PANEL
ALT: 19 U/L (ref 0–44)
AST: 35 U/L (ref 15–41)
Albumin: 2 g/dL — ABNORMAL LOW (ref 3.5–5.0)
Alkaline Phosphatase: 117 U/L (ref 38–126)
Anion gap: 19 — ABNORMAL HIGH (ref 5–15)
BUN: 80 mg/dL — ABNORMAL HIGH (ref 8–23)
CO2: 7 mmol/L — ABNORMAL LOW (ref 22–32)
Calcium: 7.1 mg/dL — ABNORMAL LOW (ref 8.9–10.3)
Chloride: 127 mmol/L — ABNORMAL HIGH (ref 98–111)
Creatinine, Ser: 4.62 mg/dL — ABNORMAL HIGH (ref 0.61–1.24)
GFR calc Af Amer: 13 mL/min — ABNORMAL LOW (ref >60)
GFR calc non Af Amer: 11 mL/min — ABNORMAL LOW (ref >60)
Glucose, Bld: 116 mg/dL — ABNORMAL HIGH (ref 70–99)
Potassium: 2.8 mmol/L — ABNORMAL LOW (ref 3.5–5.1)
Sodium: 153 mmol/L — ABNORMAL HIGH (ref 135–145)
Total Bilirubin: 1.3 mg/dL — ABNORMAL HIGH (ref 0.3–1.2)
Total Protein: 4.8 g/dL — ABNORMAL LOW (ref 6.5–8.1)

## 2019-08-16 LAB — GLUCOSE, CAPILLARY
Glucose-Capillary: 104 mg/dL — ABNORMAL HIGH (ref 70–99)
Glucose-Capillary: 105 mg/dL — ABNORMAL HIGH (ref 70–99)
Glucose-Capillary: 137 mg/dL — ABNORMAL HIGH (ref 70–99)
Glucose-Capillary: 148 mg/dL — ABNORMAL HIGH (ref 70–99)
Glucose-Capillary: 185 mg/dL — ABNORMAL HIGH (ref 70–99)
Glucose-Capillary: 229 mg/dL — ABNORMAL HIGH (ref 70–99)
Glucose-Capillary: 232 mg/dL — ABNORMAL HIGH (ref 70–99)

## 2019-08-16 LAB — C DIFFICILE QUICK SCREEN W PCR REFLEX
C Diff antigen: NEGATIVE
C Diff interpretation: NOT DETECTED
C Diff toxin: NEGATIVE

## 2019-08-16 LAB — PROTIME-INR
INR: 1.6 — ABNORMAL HIGH (ref 0.8–1.2)
Prothrombin Time: 19.2 seconds — ABNORMAL HIGH (ref 11.4–15.2)

## 2019-08-16 LAB — MRSA PCR SCREENING: MRSA by PCR: NEGATIVE

## 2019-08-16 LAB — LACTIC ACID, PLASMA: Lactic Acid, Venous: 1.8 mmol/L (ref 0.5–1.9)

## 2019-08-16 LAB — TYPE AND SCREEN
ABO/RH(D): O POS
Antibody Screen: NEGATIVE

## 2019-08-16 LAB — HEMOGLOBIN AND HEMATOCRIT, BLOOD
HCT: 35.2 % — ABNORMAL LOW (ref 39.0–52.0)
HCT: 33 % — ABNORMAL LOW (ref 39.0–52.0)
Hemoglobin: 11.9 g/dL — ABNORMAL LOW (ref 13.0–17.0)
Hemoglobin: 11.4 g/dL — ABNORMAL LOW (ref 13.0–17.0)

## 2019-08-16 LAB — ETHANOL: Alcohol, Ethyl (B): <10 mg/dL (ref <10)

## 2019-08-16 LAB — BETA-HYDROXYBUTYRIC ACID: Beta-Hydroxybutyric Acid: 3.74 mmol/L — ABNORMAL HIGH (ref 0.05–0.27)

## 2019-08-16 LAB — TSH: TSH: 1.131 u[IU]/mL (ref 0.350–4.500)

## 2019-08-16 LAB — AMMONIA: Ammonia: 49 umol/L — ABNORMAL HIGH (ref 9–35)

## 2019-08-16 LAB — APTT: aPTT: 43 seconds — ABNORMAL HIGH (ref 24–36)

## 2019-08-16 LAB — LIPASE, BLOOD: Lipase: 53 U/L — ABNORMAL HIGH (ref 11–51)

## 2019-08-16 LAB — SALICYLATE LEVEL: Salicylate Lvl: <7 mg/dL — ABNORMAL LOW (ref 7.0–30.0)

## 2019-08-16 LAB — CK: Total CK: 665 U/L — ABNORMAL HIGH (ref 49–397)

## 2019-08-16 LAB — PHOSPHORUS: Phosphorus: 2.6 mg/dL (ref 2.5–4.6)

## 2019-08-16 MED ORDER — HEPARIN SODIUM (PORCINE) 5000 UNIT/ML IJ SOLN: 5000.0000 [IU] | Freq: Three times a day (TID) | INTRAMUSCULAR | Status: DC

## 2019-08-16 MED ORDER — THIAMINE HCL 100 MG/ML IJ SOLN
100.0000 mg | Freq: Every day | INTRAMUSCULAR | Status: DC
  Administered 2019-08-16: 100 mg via INTRAVENOUS
  Filled 2019-08-16: qty 2

## 2019-08-16 MED ORDER — NOREPINEPHRINE 16 MG/250ML-% IV SOLN
0.0000 ug/min | INTRAVENOUS | Status: DC
  Administered 2019-08-16: 10 ug/min via INTRAVENOUS
  Filled 2019-08-16: qty 250

## 2019-08-16 MED ORDER — PHENYLEPHRINE CONCENTRATED 100MG/250ML (0.4 MG/ML) INFUSION SIMPLE
0.0000 ug/min | INTRAVENOUS | Status: DC
  Administered 2019-08-16: 100 ug/min via INTRAVENOUS
  Administered 2019-08-17: 210 ug/min via INTRAVENOUS
  Administered 2019-08-17: 240 ug/min via INTRAVENOUS
  Administered 2019-08-17: 270 ug/min via INTRAVENOUS
  Administered 2019-08-18: 390 ug/min via INTRAVENOUS
  Administered 2019-08-18 (×2): 400 ug/min via INTRAVENOUS
  Filled 2019-08-16 (×11): qty 250

## 2019-08-16 MED ORDER — SODIUM BICARBONATE 8.4 % IV SOLN
50.0000 meq | Freq: Once | INTRAVENOUS | Status: AC
  Administered 2019-08-16: 50 meq via INTRAVENOUS
  Filled 2019-08-16: qty 50

## 2019-08-16 MED ORDER — INSULIN ASPART 100 UNIT/ML ~~LOC~~ SOLN
0.0000 [IU] | SUBCUTANEOUS | Status: DC
  Administered 2019-08-16: 20:00:00 3 [IU] via SUBCUTANEOUS
  Administered 2019-08-16: 2 [IU] via SUBCUTANEOUS

## 2019-08-16 MED ORDER — PANTOPRAZOLE SODIUM 40 MG IV SOLR
40.0000 mg | Freq: Every day | INTRAVENOUS | Status: DC
  Administered 2019-08-16: 40 mg via INTRAVENOUS
  Filled 2019-08-16: qty 40

## 2019-08-16 MED ORDER — FOLIC ACID 5 MG/ML IJ SOLN
1.0000 mg | Freq: Every day | INTRAMUSCULAR | Status: DC
  Administered 2019-08-16 – 2019-08-18 (×3): 1 mg via INTRAVENOUS
  Filled 2019-08-16 (×3): qty 0.2

## 2019-08-16 MED ORDER — LIDOCAINE HCL URETHRAL/MUCOSAL 2 % EX GEL
1.0000 "application " | Freq: Once | CUTANEOUS | Status: AC
  Administered 2019-08-16: 1 via TOPICAL
  Filled 2019-08-16 (×2): qty 20

## 2019-08-16 MED ORDER — SODIUM BICARBONATE 8.4 % IV SOLN
100.0000 meq | Freq: Once | INTRAVENOUS | Status: AC
  Administered 2019-08-16: 100 meq via INTRAVENOUS
  Filled 2019-08-16: qty 100

## 2019-08-16 MED ORDER — POTASSIUM CHLORIDE 10 MEQ/50ML IV SOLN
10.0000 meq | INTRAVENOUS | Status: AC
  Administered 2019-08-16 (×4): 10 meq via INTRAVENOUS
  Filled 2019-08-16 (×4): qty 50

## 2019-08-16 MED ORDER — INSULIN REGULAR(HUMAN) IN NACL 100-0.9 UT/100ML-% IV SOLN
INTRAVENOUS | Status: DC
  Filled 2019-08-16: qty 100

## 2019-08-16 MED ORDER — SODIUM CHLORIDE 0.9 % IV BOLUS
1000.0000 mL | Freq: Once | INTRAVENOUS | Status: AC
  Administered 2019-08-16: 1000 mL via INTRAVENOUS

## 2019-08-16 MED ORDER — SODIUM BICARBONATE-DEXTROSE 150-5 MEQ/L-% IV SOLN
150.0000 meq | INTRAVENOUS | Status: DC
  Administered 2019-08-16 – 2019-08-17 (×4): 150 meq via INTRAVENOUS
  Filled 2019-08-16 (×6): qty 1000

## 2019-08-16 MED ORDER — PHENYLEPHRINE HCL-NACL 10-0.9 MG/250ML-% IV SOLN
0.0000 ug/min | INTRAVENOUS | Status: DC
  Administered 2019-08-16 (×2): 100 ug/min via INTRAVENOUS
  Administered 2019-08-16: 170 ug/min via INTRAVENOUS
  Filled 2019-08-16 (×4): qty 250

## 2019-08-16 MED ORDER — DEXTROSE 50 % IV SOLN: 0.0000 mL | INTRAVENOUS | Status: DC | PRN

## 2019-08-16 MED ORDER — DEXTROSE 5 % IV SOLN: INTRAVENOUS | Status: DC

## 2019-08-16 MED ORDER — SODIUM BICARBONATE 8.4 % IV SOLN
INTRAVENOUS | Status: DC
  Filled 2019-08-16 (×2): qty 150

## 2019-08-16 MED ORDER — CHLORHEXIDINE GLUCONATE CLOTH 2 % EX PADS
6.0000 | MEDICATED_PAD | Freq: Every day | CUTANEOUS | Status: DC
  Administered 2019-08-16 – 2019-08-18 (×3): 6 via TOPICAL

## 2019-08-16 MED ORDER — SODIUM CHLORIDE 0.9 % IV SOLN
25.0000 ug/h | INTRAVENOUS | Status: DC
  Filled 2019-08-16 (×2): qty 50

## 2019-08-16 MED ORDER — THIAMINE HCL 100 MG/ML IJ SOLN
500.0000 mg | Freq: Every day | INTRAVENOUS | Status: DC
  Filled 2019-08-16: qty 5

## 2019-08-16 MED ORDER — M.V.I. ADULT IV INJ
Freq: Once | INTRAVENOUS | Status: AC
  Filled 2019-08-16: qty 10

## 2019-08-16 MED ORDER — LIDOCAINE HCL (PF) 1 % IJ SOLN
INTRAMUSCULAR | Status: AC
  Administered 2019-08-16: 5 mL
  Filled 2019-08-16: qty 5

## 2019-08-16 MED ORDER — POTASSIUM CHLORIDE 10 MEQ/50ML IV SOLN
10.0000 meq | INTRAVENOUS | Status: AC
  Administered 2019-08-16 – 2019-08-17 (×3): 10 meq via INTRAVENOUS
  Filled 2019-08-16 (×3): qty 50

## 2019-08-16 MED ORDER — FENTANYL BOLUS VIA INFUSION
25.0000 ug | INTRAVENOUS | Status: DC | PRN
  Filled 2019-08-16: qty 25

## 2019-08-16 MED ORDER — POTASSIUM CHLORIDE 10 MEQ/100ML IV SOLN: 10.0000 meq | INTRAVENOUS | Status: DC

## 2019-08-16 MED ORDER — DEXTROSE-NACL 5-0.9 % IV SOLN: INTRAVENOUS | Status: DC

## 2019-08-16 MED ORDER — LIDOCAINE HCL (PF) 1 % IJ SOLN
INTRAMUSCULAR | Status: AC
  Administered 2019-08-16: 16:00:00 5 mL
  Filled 2019-08-16: qty 5

## 2019-08-16 MED ORDER — FENTANYL CITRATE (PF) 100 MCG/2ML IJ SOLN: 25.0000 ug | Freq: Once | INTRAMUSCULAR | Status: DC

## 2019-08-16 MED ORDER — METRONIDAZOLE IN NACL 5-0.79 MG/ML-% IV SOLN
500.0000 mg | Freq: Three times a day (TID) | INTRAVENOUS | Status: DC
  Administered 2019-08-16 – 2019-08-18 (×7): 500 mg via INTRAVENOUS
  Filled 2019-08-16 (×7): qty 100

## 2019-08-16 MED ORDER — PHENYLEPHRINE HCL-NACL 10-0.9 MG/250ML-% IV SOLN
INTRAVENOUS | Status: AC
  Administered 2019-08-16: 08:00:00 20 ug/min via INTRAVENOUS
  Filled 2019-08-16: qty 250

## 2019-08-16 MED ORDER — PANTOPRAZOLE SODIUM 40 MG IV SOLR
40.0000 mg | Freq: Two times a day (BID) | INTRAVENOUS | Status: DC
  Administered 2019-08-16 – 2019-08-18 (×4): 40 mg via INTRAVENOUS
  Filled 2019-08-16 (×4): qty 40

## 2019-08-16 MED ORDER — LACTATED RINGERS IV SOLN: INTRAVENOUS | Status: DC

## 2019-08-16 MED ORDER — THIAMINE HCL 100 MG/ML IJ SOLN
100.0000 mg | Freq: Every day | INTRAMUSCULAR | Status: DC
  Administered 2019-08-17 – 2019-08-18 (×2): 100 mg via INTRAVENOUS
  Filled 2019-08-16 (×2): qty 2

## 2019-08-16 MED ORDER — MAGNESIUM SULFATE 2 GM/50ML IV SOLN
2.0000 g | Freq: Once | INTRAVENOUS | Status: AC
  Administered 2019-08-16: 15:00:00 2 g via INTRAVENOUS
  Filled 2019-08-16: qty 50

## 2019-08-16 MED ORDER — STERILE WATER FOR INJECTION IV SOLN
INTRAVENOUS | Status: DC
  Filled 2019-08-16 (×2): qty 850

## 2019-08-16 MED ORDER — LIDOCAINE HCL (PF) 1 % IJ SOLN
5.0000 mL | Freq: Once | INTRAMUSCULAR | Status: AC
  Administered 2019-08-16: 5 mL via INTRADERMAL
  Filled 2019-08-16: qty 5

## 2019-08-16 MED ORDER — LIDOCAINE HCL (PF) 1 % IJ SOLN
INTRAMUSCULAR | Status: AC
  Administered 2019-08-16: 18:00:00 10 mL
  Filled 2019-08-16: qty 20

## 2019-08-16 NOTE — Progress Notes (Signed)
Received a phone call from Adult protective services, Antonietta Breach. She said to reach out when patient is able to talk/ be interviewed and to also call [phone] when patient is getting ready to leave the hospital.   Dewaine Oats, RN

## 2019-08-16 NOTE — Consult Note (Signed)
Neurology Consultation  Reason for Consult: Stroke Referring Physician: Sid  CC: Right pontine stroke  History is obtained from: Chart  HPI: Colin [name] is a 79 y.o. male past medical history significant for diabetes, CKD stage III, EtOH abuse in remission, IV drug abuse and admission, diabetes, prostate cancer, who lives alone.  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   Per son, he mostly stays in bed but will get up to perform ADLs and go to the store occasionally.  Son went to see him on February 3rd and noted patient did not seem to be feeling very well.  He then checked him on him again on February 6th and patient was able to eat but was not very energetic, at that time was alert and oriented.  The next day he was unable to reach patient by phone, so son went to check on him and found him in bed covered in feces and minimally responsive brought into the emergency department.    In the ED, patient was initially very hypotensive with blood pressure 50/30 and hypothermic.  Labs significant for significant metabolic acidosis, hypenatremia with sodium of 150, white blood cell count 29.8, lactic acid 0.4 and creatinine 5.15 above baseline of 1.2-1.6.  Covid-19 and respiratory viral panel were negative, chest x-ray without infiltrates, no urine obtained from Foley bag.  He was covered with Vanc, cefepime and Flagyl and given 30 cc/kg IV fluids and transferred to [facility] for ICU admission.  Currently he is nonvocal.  He does wince to pain in all 4 extremities.  LKW: Unknown tpa given?: no, out of window Premorbid modified Rankin scale (mRS): 0 NIH stroke score: 18   Past Medical History:  Diagnosis Date  . Arthritis   . Bilateral lower extremity edema   . Chronic venous insufficiency   . CKD (chronic kidney disease), stage III (HCC)   . History of alcohol abuse    none since 2013 per pt  . History of chronic hepatitis    Genotype 1, FO/F1--  treated w/ harvoni (completed May 2016)  . History of external beam radiation therapy    2007 prostate  . History of intravenous drug use in remission    per pt in remission since 1980's  . HOH (hard of hearing)   . Hypertension   . Normocytic anemia   . Recurrent prostate cancer [facility]) urologist-  dr Mena Goes   dx 2007  s/p  external beam radioation therapy/ recurrent 12-2015  Gleason 4+4  . Type 2 diabetes, diet controlled (HCC)    no meds since 2010 approx    Family History  Problem Relation Age of Onset  . Colon cancer Neg Hx   . Liver disease Neg Hx    Social History:   reports that he quit smoking about 47 years ago. He quit after 12.00 years of use. He has never used smokeless tobacco. He reports that he does not drink alcohol or use drugs.  Medications  Current Facility-Administered Medications:  .  ceFEPIme (MAXIPIME) 1 g in sodium chloride 0.9 % 100 mL IVPB, 1 g, Intravenous, Q24H, Gleason, Darcella Gasman, PA-C .  Chlorhexidine Gluconate Cloth 2 % PADS 6 each, 6 each, Topical, Daily, Scatliffe, Kristen D, MD .  folic acid injection 1 mg, 1 mg, Intravenous, Daily, Duayne Cal, NP, 1 mg at 08/16/19 1118 .  lidocaine (PF) (XYLOCAINE) 1 % injection 5 mL, 5 mL, Intradermal, Once, Federico Flake,  MD .  magnesium sulfate IVPB 2 g 50 mL, 2 g, Intravenous, Once, Sood, Vineet, MD, Last Rate: 50 mL/hr at 08/16/19 1447, 2 g at 08/16/19 1447 .  metroNIDAZOLE (FLAGYL) IVPB 500 mg, 500 mg, Intravenous, Q8H, Stretch, Marveen Reeks, MD, Last Rate: 100 mL/hr at 08/16/19 1225, 500 mg at 08/16/19 1225 .  norepinephrine (LEVOPHED) 16 mg in premix infusion, 0-40 mcg/min, Intravenous, Titrated, Sood, Vineet, MD .  pantoprazole (PROTONIX) injection 40 mg, 40 mg, Intravenous, Q12H, Hoffman, Clarene Critchley, NP .  phenylephrine CONCENTRATED 100mg  in sodium chloride 0.9% (0.4mg /mL) infusion, 0-400 mcg/min, Intravenous, Titrated, Sood, Vineet, MD .  sodium bicarbonate 150 mEq in dextrose 5% 1000 mL  infusion, 150 mEq, Intravenous, Continuous, Duayne Cal, NP, Last Rate: 125 mL/hr at 08/16/19 1153, 150 mEq at 08/16/19 1153 .  thiamine (B-1) injection 100 mg, 100 mg, Intravenous, Daily, Duayne Cal, NP .  Melene Muller ON 08/17/2019] vancomycin (VANCOREADY) IVPB 750 mg/150 mL, 750 mg, Intravenous, Q48H, Gleason, Darcella Gasman, PA-C  ROS:  Unable to obtain due to altered mental status.    Exam: Current vital signs: BP (!) 103/59   Pulse (!) 106   Temp 98.8 F (37.1 C) (Axillary)   Resp (!) 31   Ht 5\' 10"  (1.778 m)   Wt 53.7 kg   SpO2 100%   BMI 16.99 kg/m  Vital signs in last 24 hours: Temp:  [88.7 F (31.5 C)-98.8 F (37.1 C)] 98.8 F (37.1 C) (02/08 1156) Pulse Rate:  [45-154] 106 (02/08 1515) Resp:  [15-32] 31 (02/08 1515) BP: (40-143)/(18-113) 103/59 (02/08 1515) SpO2:  [85 %-100 %] 100 % (02/08 1515) Weight:  [53.7 kg-55 kg] 53.7 kg (02/08 0500)   Constitutional: Cachectic Eyes: No scleral injection HENT: No OP obstrucion Head: Normocephalic.  Cardiovascular: Normal rate and regular rhythm.  Respiratory: Effort normal, non-labored breathing GI: Soft.  No distension. There is no tenderness.  Skin: Multiple bruises  Neuro: Mental Status: Mental Status: Patient will look at practitioner when talked to.  Her Mrs. to deep sternal rub.  Does not follow verbal commands.  No verbalizations noted.  Cranial Nerves: II: patient does respond confrontation bilaterally,  III,IV,VI: doll's response present bilaterally. pupils right 2 mm, left 2 mm,and intact unresponsive bilaterally V,VII: corneal reflex present bilaterally  VIII: We will look at practitioner in follow-up practitioner to verbal stimuli XII: tongue  Motor: Normal tone on the right upper and lower extremity.  Decreased tone on the left upper and lower extremity Sensory: Does respond to noxious stimuli in any extremity. Deep Tendon Reflexes:  No ankle jerk, decreased knee jerk and 2+ in the upper  extremity Plantars: Mute bilaterally Cerebellar: Unable to perform  Labs I have reviewed labs in epic and the results pertinent to this consultation are:   CBC    Component Value Date/Time   WBC 29.8 (H) 08-17-2019 2021   RBC 4.60 Aug 17, 2019 2021   HGB 11.9 (L) 08/16/2019 0844   HCT 35.2 (L) 08/16/2019 0844   PLT 476 (H) 2019-08-17 2021   MCV 89.8 08/17/2019 2021   MCH 28.5 2019/08/17 2021   MCHC 31.7 08-17-2019 2021   RDW 19.0 (H) 08-17-2019 2021   LYMPHSABS 1.2 2019/08/17 2021   MONOABS 1.9 (H) 08-17-2019 2021   EOSABS 0.5 August 17, 2019 2021   BASOSABS 0.1 08/17/19 2021    CMP     Component Value Date/Time   NA 152 (H) 08/16/2019 1211   NA 137 05/09/2017 0000   K 3.5 08/16/2019  1211   CL 126 (H) 08/16/2019 1211   CO2 8 (L) 08/16/2019 1211   GLUCOSE 158 (H) 08/16/2019 1211   BUN 77 (H) 08/16/2019 1211   BUN 14 05/09/2017 0000   CREATININE 4.51 (H) 08/16/2019 1211   CREATININE 2.66 (H) 01/27/2015 0832   CALCIUM 6.8 (L) 08/16/2019 1211   PROT 4.8 (L) 08/16/2019 0739   ALBUMIN 2.0 (L) 08/16/2019 0739   AST 35 08/16/2019 0739   ALT 19 08/16/2019 0739   ALKPHOS 117 08/16/2019 0739   BILITOT 1.3 (H) 08/16/2019 0739   GFRNONAA 12 (L) 08/16/2019 1211   GFRAA 14 (L) 08/16/2019 1211    Lipid Panel  No results found for: CHOL, TRIG, HDL, CHOLHDL, VLDL, LDLCALC, LDLDIRECT   Imaging I have reviewed the images obtained:  CT-scan of the brain shows evidence of right brain stem infarct in the pons new since 2018   Felicie Morn PA-C [facility] [phone]  M-F  (9:00 am- 5:00 PM)  08/16/2019, 3:28 PM     Assessment:  79 year old male with multiple metabolic and infectious issues.  These include hypernatremia of 57, creatinine of 4.51, BUN of 77, white blood cell count of 29.7, slowly resolving hypokalemia from 2.7 now normalized.  Patient is being currently hospitalized for septic shock with possible sources of lung, wounds, urine and currently on  antibiotics.  Patient now found on CT to have a subacute right pontine infarct new from 2018.  At this time I do not believe that the pontine infarct is causing patient's altered mental status.  Most likely cause is infectious and metabolic etiology.  However given the fact that he is septic patient could have possibly had a septic embolic shower.   Impression: -Sepsis -Toxic/metabolic encephalopathy -Possible  Subacute infarct in the right pons  Recommendations: -Treat underlying infection -Treat underlying metabolic abnormalities -MRI brain to evaluate for possible septic embolic shower  NEUROHOSPITALIST ADDENDUM Performed a face to face diagnostic evaluation.   I have reviewed the contents of history and physical exam as documented by PA/ARNP/Resident and agree with above documentation.  I have discussed and formulated the above plan as documented. Edits to the note have been made as needed.  79 year old male with past medical history significant for IV drug abuse, alcohol abuse in remission, prostate cancer status post prostatectomy with suprapubic catheter and bowel obstruction status post colostomy transferred [facility] to ICU in the setting of septic shock.  Neurology consulted and CT head showed hypodensity in the right pons concerning for acute stroke.  I examined the patient, exam is nonlocalizing.  Tracks examiner but is nonverbal and does not follow any commands.   CN: Blinks to threat bilaterally, pupils are equal and reactive no obvious facial droop.  Gag reflex present.   Motor: Does move both arms spontaneously antigravity, at times he feels he moves left side more than the right.  No withdrawal in bilateral lower extremities.  Plantars are upgoing. Sensory: Withdraws to noxious stimulus bilaterally Cerebellar: Unable to assess  Ammonia elevated 49, BUN elevated at 80.  Impression Toxic metabolic encephalopathy Possible subacute ischemic stroke vs chronic  infarction in pons -MRI brain pending, if MRI brain does shows acute stroke, will make further recommendations.   Neurology will follow.   Georgiana Spinner Aamir Mclinden MD [facility] [phone]   If 7pm to 7am, please call on call as listed on AMION.

## 2019-08-16 NOTE — ED Notes (Addendum)
Pt alert and responsive; able to voice name and answer simple questions. Pt is very hard of hearing

## 2019-08-16 NOTE — Progress Notes (Signed)
CRITICAL VALUE ALERT  Critical Value:  Potassium 2.7  Date & Time Notied:  08/16/2019  Provider Notified: Warren Lacy
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
## 2019-08-16 NOTE — Progress Notes (Signed)
CRITICAL VALUE ALERT  Critical Value:  Serum osmolality 344  Date & Time Notied:  2/8  0416  Provider Notified: E-link MD via nurse [place]  Orders Received/Actions taken: awaiting orders

## 2019-08-16 NOTE — Plan of Care (Signed)
Mr. Treadwell came from an OSH. He appears emaciated and unable to properly complete ADLs. Multiple pressure injuries found. He is alert, but experiencing expressive aphasia and his ability to follow commands repetitively is absent. BP maintained on Levophed gtt, but is having to be titrated upward. No UO. SVT noted by the RN and Elink contacted, but EKG unable to be obtained to capture rhythm. Report given to day shift RN and updated on POC.   Problem: Respiratory: Goal: Ability to maintain adequate ventilation will improve Outcome: Progressing   Problem: Nutrition Goal: Nutritional status is improving Description: Monitor and assess patient for malnutrition (ex- brittle hair, bruises, dry skin, pale skin and conjunctiva, muscle wasting, smooth red tongue, and disorientation). Collaborate with interdisciplinary team and initiate plan and interventions as ordered.  Monitor patient's weight and dietary intake as ordered or per policy. Utilize nutrition screening tool and intervene per policy. Determine patient's food preferences and provide high-protein, high-caloric foods as appropriate.  Outcome: Not Progressing   Problem: Clinical Measurements: Goal: Ability to avoid or minimize complications of infection will improve Outcome: Not Progressing   Problem: Skin Integrity: Goal: Skin integrity will improve Outcome: Not Progressing   Problem: Fluid Volume: Goal: Hemodynamic stability will improve Outcome: Not Progressing   Problem: Clinical Measurements: Goal: Signs and symptoms of infection will decrease Outcome: Not Progressing

## 2019-08-16 NOTE — Progress Notes (Signed)
CRITICAL VALUE ALERT  Critical Value:  Calcium 6.4  Date & Time Notied:  08/16/2019  Provider Notified: Warren Lacy

## 2019-08-16 NOTE — H&P (Addendum)
NAME:  Colin [name], MRN:  [id number], DOB:  May 12, 1941, LOS: 1 ADMISSION DATE:  08/10/2019, CONSULTATION DATE:  08/16/19  CHIEF COMPLAINT:  sepsis   Brief History   79 y.o. M with past medical history significant for diabetes, CKD stage III, EtOH abuse in remission, IV drug abuse and admission, diabetes, prostate cancer, bowel obstruction status post diverting colostomy indwelling suprapubic catheter who lives alone and who had not been heard from and around 1 day and was found confused in bed covered in feces.  Septic and acidotic and transferred to [facility] for ICU admission.  History of present illness   Colin [name] is a 79 year old male with past medical history significant for diabetes, CKD stage III, EtOH abuse in remission, IV drug abuse and admission, diabetes, prostate cancer, bowel obstruction status post diverting colostomy who lives alone.  Per son, he mostly stays in bed but will get up to perform ADLs and go to the store occasionally.  His dog died 2 weeks ago and he seemed more depressed, son went to see him on February 3rd and patient did not seem to be feeling very well.  He checked him on him again on February 6th and patient was able to eat but was not very energetic, at that time was alert and oriented.  The next day he was unable to reach patient by phone, so went to check on him and found him in bed covered in feces and minimally responsive brought into the emergency department.   In the ED, patient was initially very hypotensive with blood pressure 50/30 and hypothermic.  Labs significant for significant metabolic acidosis, hyponatremia with sodium of 150, white blood cell count 29.8, lactic acid 0.4 and creatinine 5.15 above baseline of 1.2-1.6.  Covid-19 and respiratory viral panel were negative, chest x-ray without infiltrates, no urine obtained from Foley bag.  He was covered with Vanc, cefepime and Flagyl and given 30 cc/kg IV fluids and transferred to [facility] for ICU  admission.   Past Medical History   has a past medical history of Arthritis, Bilateral lower extremity edema, Chronic venous insufficiency, CKD (chronic kidney disease), stage III (HCC), History of alcohol abuse, History of chronic hepatitis, History of external beam radiation therapy, History of intravenous drug use in remission, HOH (hard of hearing), Hypertension, Normocytic anemia, Recurrent prostate cancer [facility]) (urologist-  dr Mena Goes), and Type 2 diabetes, diet controlled (HCC).   Significant Hospital Events   2/8 Transfer from [facility] for admission to [facility]  Consults:  nephrology  Procedures:  R picc line placed in ED  Significant Diagnostic Tests:    Micro Data:  2/7 Sars-CoV-2 and flu>>negative 2/7 BCx2>> 2/7 UC>>  Antimicrobials:  Cefepime 2/7- Vancomycin 2/7- Flagyl 2/7-  Interim history/subjective:  Pt arrived to [facility] on Lev[facility], awake but confused  Objective   Blood pressure 113/70, pulse 80, temperature (!) 88.7 F (31.5 C), temperature source Rectal, resp. rate (!) 21, height 5\' 10"  (1.778 m), weight 55 kg, SpO2 100 %.       No intake or output data in the 24 hours ending 08/16/19 0130 Filed Weights   08/12/2019 2000 08/30/2019 2011  Weight: 55 kg 55 kg   General:  Elderly, malnourished M in no acute distress HEENT: MM pink/dry Neuro: eyes open and tracking, not following commands, moaning CV: s1s2 rrr, no m/r/g PULM:  CTAB GI: soft, bsx4 active  Extremities: warm/dry, R knee erythema and edema, with lateral wound draining purulent drainage, stage 1  sacral decub Skin: no rashes or lesions          Resolved Hospital Problem list     Assessment & Plan:   Septic shock with unclear source, possibly right knee cellulitis but consider intra-abdominal etiology -Per prior notes, 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 Diff -Continue broad empiric coverage with vancomycin, flagyl and cefepime -Plain films of right knee, may need Ortho consult and MRI or CT -Received 30 cc/kg IV fluids, however appears significantly volume depleted on exam and on bedside ultrasound IVC measures 1.2 and is variable and collapsible so we will give additional 2 L normal saline and reevaluate, free water deficit 2.4 L  Oliguric acute on chronic renal insufficiency -Creatinine 1.2-1.6, 5.15 on admission likely secondary to prerenal volume depletion P: -Monitor urine output, will need nephrology consult, follow BMP and avoid nephrotoxic medications  Anion gap metabolic acidosis -May be partially explained by lactic acidosis and uremia, however anion gap significantly elevated at 17 P: -pH on arrival 7.1, given 2 A of bicarb and bicarb gtt. initiated -Check serum osms, alcohol and salicylate levels and beta hydroxybutyrate to evaluate for euvolemic DKA -Repeat ABG later this morning  Hypovolemic hypernatremia  -NA 150 on arrival P: -Follow every 4 hours BMP after IV fluids to avoid overcorrection, goal reduction 8 to 10 mmol in 24 hours  Encephalopathy -Baseline alert and oriented per son, likely metabolic P: -Check CT head, TSH, ammonia   Failure to thrive -Stage I decubitus ulcers and poor nutrition P: -We will likely need case management consult and possible placement -Start thiamine, check phosphorus and magnesium level, will be at risk for re feeding syndrome  Type 2 diabetes -Hold home glipizide P: -Follow beta hydroxybutyrate, may need low-dose insulin gtt., check hemoglobin A1c and start sliding scale insulin  GI bleed -Dark stool in colostomy bag, heme-positive P: -Obtain type and screen, start Protonix, repeat CBC later  this morning -Transfuse as needed, depending on patient's course may need GI consult when more stable  *I spoke with patient's son, he would like patient to remain full code at this time, but knows his father would not want to live on life support.  He will be up to visit his father tomorrow and will try to clarify CODE STATUS if his father is oriented enough to discuss  Best practice:  Diet: N.p.o. for right now Pain/Anxiety/Delirium protocol (if indicated): N/A VAP protocol (if indicated): N/A DVT prophylaxis: SCDs GI prophylaxis: Protonix Glucose control: Pending scale insulin Mobility: Bedrest Code Status: Full code Family Communication: Patient son updated Disposition: ICU  Labs   CBC: Recent Labs  Lab 2019/09/09 2021  WBC 29.8*  NEUTROABS 25.4*  HGB 13.1  HCT 41.3  MCV 89.8  PLT 476*    Basic Metabolic Panel: Recent Labs  Lab 09/09/2019 2021  NA 150*  K 4.0  CL 129*  CO2 <7*  GLUCOSE 171*  BUN 90*  CREATININE 5.15*  CALCIUM 8.4*   GFR: Estimated Creatinine Clearance: 9.3 mL/min (A) (by C-G formula based on SCr of 5.15 mg/dL (H)). Recent Labs  Lab September 09, 2019 2021  WBC 29.8*  LATICACIDVEN 3.4*    Liver Function Tests: Recent Labs  Lab 09-09-2019 2021  AST  22  ALT 20  ALKPHOS 153*  BILITOT 0.8  PROT 6.3*  ALBUMIN 2.8*   No results for input(s): LIPASE, AMYLASE in the last 168 hours. No results for input(s): AMMONIA in the last 168 hours.  ABG    Component Value Date/Time   PHART 7.056 (LL) 08/22/2019 2024   PCO2ART 11.3 (LL) 08/26/2019 2024   PO2ART 291 (H) 08/28/2019 2024   HCO3 6.7 (L) 09/01/2019 2024   TCO2 28 05/24/2016 0638   ACIDBASEDEF 25.9 (H) 08/28/2019 2024   O2SAT 99.0 08/30/2019 2024     Coagulation Profile: No results for input(s): INR, PROTIME in the last 168 hours.  Cardiac Enzymes: No results for input(s): CKTOTAL, CKMB, CKMBINDEX, TROPONINI in the last 168 hours.  HbA1C: Hgb A1c MFr Bld  Date/Time Value Ref Range Status   02/10/2019 01:16 PM 8.8 (H) 4.8 - 5.6 % Final    Comment:    (NOTE) Pre diabetes:          5.7%-6.4% Diabetes:              >6.4% Glycemic control for   <7.0% adults with diabetes   12/29/2009 09:30 AM (H) <5.7 % Final   9.6 (NOTE)                                                                       According to the ADA Clinical Practice Recommendations for 2011, when HbA1c is used as a screening test:   >=6.5%   Diagnostic of Diabetes Mellitus           (if abnormal result  is confirmed)  5.7-6.4%   Increased risk of developing Diabetes Mellitus  References:Diagnosis and Classification of Diabetes Mellitus,Diabetes Care,2011,34(Suppl 1):S62-S69 and Standards of Medical Care in         Diabetes - 2011,Diabetes Care,2011,34  (Suppl 1):S11-S61.    CBG: Recent Labs  Lab 08/27/2019 2008  GLUCAP 85    Review of Systems:   Unable to obtain secondary to encephalopathy  Past Medical History  He,  has a past medical history of Arthritis, Bilateral lower extremity edema, Chronic venous insufficiency, CKD (chronic kidney disease), stage III (HCC), History of alcohol abuse, History of chronic hepatitis, History of external beam radiation therapy, History of intravenous drug use in remission, HOH (hard of hearing), Hypertension, Normocytic anemia, Recurrent prostate cancer [facility]) (urologist-  dr Mena Goes), and Type 2 diabetes, diet controlled (HCC).   Surgical History    Past Surgical History:  Procedure Laterality Date  . CATARACT EXTRACTION W/PHACO Left 12/14/2012   Procedure: CATARACT EXTRACTION PHACO AND INTRAOCULAR LENS PLACEMENT (IOC);  Surgeon: Gemma Payor, MD;  Location: [facility];  Service: Ophthalmology;  Laterality: Left;  CDE: 13.74  . CATARACT EXTRACTION W/PHACO Right 12/28/2012   Procedure: CATARACT EXTRACTION PHACO AND INTRAOCULAR LENS PLACEMENT (IOC);  Surgeon: Gemma Payor, MD;  Location: [facility];  Service: Ophthalmology;  Laterality: Right;  CDE:12.80  . COLONOSCOPY N/A 02/14/2014    [id number] colonic polyps-removed as described above Colonic diverticulosis (tubular adenoma)  . CRYOABLATION N/A 05/24/2016   Procedure: CRYO ABLATION PROSTATE;  Surgeon: Jerilee Field, MD;  Location: [facility];  Service: Urology;  Laterality: N/A;  . ENDOVENOUS ABLATION SAPHENOUS VEIN W/ LASER  2013  Right leg X 2 by Dr. Rennis Golden  . ESOPHAGOGASTRODUODENOSCOPY N/A 02/14/2014   [id number] large pedunculated gastric polyp (hyperplastic)  . INCISION AND DRAINAGE OF PERITONSILLAR ABCESS  04/24/2017   Procedure: INCISION AND DRAINAGE OF PERINEUM ABCESS;  Surgeon: Marcine Matar, MD;  Location: [facility] OR;  Service: Urology;;  . INSERTION OF SUPRAPUBIC CATHETER N/A 04/24/2017   Procedure: OPEN INSERTION OF SUPRAPUBIC CATHETER;  Surgeon: Marcine Matar, MD;  Location: [facility] OR;  Service: Urology;  Laterality: N/A;  . IR CATHETER TUBE CHANGE  01/01/2018  . IR FLUORO RM 30-60 MIN  09/09/2017  . LAPAROSCOPIC DIVERTED COLOSTOMY N/A 04/24/2017   Procedure: LAPAROSCOPIC SIGMOID COLOSTOMY;  Surgeon: Andria Meuse, MD;  Location: [facility] OR;  Service: General;  Laterality: N/A;  . REIMPLANTATION OF TOTAL KNEE Left 03/16/2010  . TONSILLECTOMY  child  . TOTAL KNEE  PROSTHESIS REMOVAL W/ SPACER INSERTION Left 12/29/2009  . TOTAL KNEE ARTHROPLASTY Bilateral left 2007/   right 2005 approx     Social History   reports that he quit smoking about 47 years ago. He quit after 12.00 years of use. He has never used smokeless tobacco. He reports that he does not drink alcohol or use drugs.   Family History   His family history is negative for Colon cancer and Liver disease.   Allergies No Known Allergies   Home Medications  Prior to Admission medications   Medication Sig Start Date End Date Taking? Authorizing Provider  ferrous sulfate 325 (65 FE) MG tablet Take 325 mg by mouth 3 (three) times daily with meals.    [provider]  glipiZIDE (GLUCOTROL) 5 MG tablet Take 0.5 tablets  (2.5 mg total) by mouth daily. 02/12/19 02/12/20  Vassie Loll, MD  Multiple Vitamins-Minerals (MULTIVITAMIN WITH MINERALS) tablet Take 1 tablet by mouth daily.      [provider]  naproxen sodium (ALEVE) 220 MG tablet Take 2 tablets (440 mg total) by mouth daily as needed (Severe pain). 02/12/19   Vassie Loll, MD  pantoprazole (PROTONIX) 20 MG tablet Take 1 tablet (20 mg total) by mouth daily. 02/12/19 02/12/20  Vassie Loll, MD  tamsulosin (FLOMAX) 0.4 MG CAPS capsule Take 0.4 mg by mouth daily. 06/16/17   [provider]  torsemide (DEMADEX) 20 MG tablet Take 2 tablets (40 mg total) by mouth 2 (two) times daily. 02/12/19   Vassie Loll, MD     Critical care time: 65 minutes     CRITICAL CARE Performed by: Darcella Gasman Ragna Kramlich   Total critical care time: 65 minutes  Critical care time was exclusive of separately billable procedures and treating other patients.  Critical care was necessary to treat or prevent imminent or life-threatening deterioration.  Critical care was time spent personally by me on the following activities: development of treatment plan with patient and/or surrogate as well as nursing, discussions with consultants, evaluation of patient's response to treatment, examination of patient, obtaining history from patient or surrogate, ordering and performing treatments and interventions, ordering and review of laboratory studies, ordering and review of radiographic studies, pulse oximetry and re-evaluation of patient's condition.  Darcella Gasman Arleene Settle, PA-C

## 2019-08-16 NOTE — Consult Note (Signed)
Urology Consult Note   Requesting Attending Physician:  Chesley Mires, MD Service Providing Consult: Urology  Consulting Attending: Lovena Neighbours  Primary Urologist: [place]   Reason for Consult:  Encrusted suprapubic catheter  HPI: Colin [name] is seen in consultation for reasons noted above at the request of Chesley Mires, MD  79 year old unhealthy male with history of diabetes, CKD, alcohol abuse, drug abuse, prostate cancer, rectourethral fistula   Diagnosed with prostate cancer in 2007.  Underwent external beam brachytherapy.  PSA recurrence with Gleason 4+4 disease and underwent salvage cryoablation on 05/24/2016.  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  This was managed with a diverting colostomy and open suprapubic tube in 2018.  He was last seen in urology clinic on 12/08/2018.  At that time he was extremely unkempt, disheveled, and covered in stool and social service consult was placed.  His suprapubic catheter was exchanged with significant difficulty due to encrustation of the catheter.  Plan was to follow-up for catheter exchange which he did not follow through with.  His baseline is that he lives alone but will get up to perform daily activities.  He was found in bed covered in feces and was minimally responsive and brought to the emergency room on 09/05/2019 by his son.   He is currently being worked up and treated for sepsis.  A CT scan of his abdomen pelvis revealed a encrusted catheter with a 1.1 cm bladder stone adherent to the tip.  Suprapubic catheter could not be removed at bedside and urology was consulted.  The balloon inflated and deflated easily but when I attempted to remove the suprapubic it would not come out.  I spoke to the son and consented for a complex suprapubic catheter removal.  After sterile prepping and draping 20 cc of 1% lidocaine without epinephrine was injected in and around the suprapubic site.  A small skin incision  was made with an 11 blade scalpel.  Hemostats were then used to trace the path of the catheter below the fascia.  Hemostats were then spread to enlarge the fascial opening.  Suprapubic catheter was then able to be removed, although it took excessive pulling.  The catheter tip was completely encrusted. SP site was irrigated with 30cc of sterile saline.   After changing gloves and re-prepping the area a new 20 French catheter was placed via the suprapubic tract.  10 cc of sterile water were placed in the balloon and the catheter was irrigated with 200 cc of sterile water.  It appeared to flush well indicating appropriate placement.   Past Medical History: Past Medical History:  Diagnosis Date  . Arthritis   . Bilateral lower extremity edema   . Chronic venous insufficiency   . CKD (chronic kidney disease), stage III ([place])   . History of alcohol abuse    none since 2013 per pt  . History of chronic hepatitis    Genotype 1, FO/F1-- treated w/ harvoni (completed May 2016)  . History of external beam radiation therapy    2007 prostate  . History of intravenous drug use in remission    per pt in remission since 1980's  . HOH (hard of hearing)   . Hypertension   . Normocytic anemia   . Recurrent prostate cancer [facility]) urologist-  dr Junious Silk   dx 2007  s/p  external beam radioation therapy/ recurrent 12-2015  Gleason 4+4  . Type 2 diabetes, diet controlled ([place])    no meds since 2010  approx    Past Surgical History:  Past Surgical History:  Procedure Laterality Date  . CATARACT EXTRACTION W/PHACO Left 12/14/2012   Procedure: CATARACT EXTRACTION PHACO AND INTRAOCULAR LENS PLACEMENT (IOC);  Surgeon: Tonny Branch, MD;  Location: [facility];  Service: Ophthalmology;  Laterality: Left;  CDE: 13.74  . CATARACT EXTRACTION W/PHACO Right 12/28/2012   Procedure: CATARACT EXTRACTION PHACO AND INTRAOCULAR LENS PLACEMENT (IOC);  Surgeon: Tonny Branch, MD;  Location: [facility];  Service: Ophthalmology;  Laterality:  Right;  CDE:12.80  . COLONOSCOPY N/A 02/14/2014   [id number] colonic polyps-removed as described above Colonic diverticulosis (tubular adenoma)  . CRYOABLATION N/A 05/24/2016   Procedure: CRYO ABLATION PROSTATE;  Surgeon: Festus Aloe, MD;  Location: [facility];  Service: Urology;  Laterality: N/A;  . ENDOVENOUS ABLATION SAPHENOUS VEIN W/ LASER  2013   Right leg X 2 by Dr. Debara Pickett  . ESOPHAGOGASTRODUODENOSCOPY N/A 02/14/2014   [id number] large pedunculated gastric polyp (hyperplastic)  . INCISION AND DRAINAGE OF PERITONSILLAR ABCESS  04/24/2017   Procedure: INCISION AND DRAINAGE OF PERINEUM ABCESS;  Surgeon: Franchot Gallo, MD;  Location: [facility];  Service: Urology;;  . INSERTION OF SUPRAPUBIC CATHETER N/A 04/24/2017   Procedure: OPEN INSERTION OF SUPRAPUBIC CATHETER;  Surgeon: Franchot Gallo, MD;  Location: [facility];  Service: Urology;  Laterality: N/A;  . IR CATHETER TUBE CHANGE  01/01/2018  . IR FLUORO RM 30-60 MIN  09/09/2017  . LAPAROSCOPIC DIVERTED COLOSTOMY N/A 04/24/2017   Procedure: LAPAROSCOPIC SIGMOID COLOSTOMY;  Surgeon: Ileana Roup, MD;  Location: [facility];  Service: General;  Laterality: N/A;  . REIMPLANTATION OF TOTAL KNEE Left 03/16/2010  . TONSILLECTOMY  child  . TOTAL KNEE  PROSTHESIS REMOVAL W/ SPACER INSERTION Left 12/29/2009  . TOTAL KNEE ARTHROPLASTY Bilateral left 2007/   right 2005 approx    Medication: Current Facility-Administered Medications  Medication Dose Route Frequency Provider Last Rate Last Admin  . ceFEPIme (MAXIPIME) 1 g in sodium chloride 0.9 % 100 mL IVPB  1 g Intravenous Q24H Gleason, Otilio Carpen, PA-C      . Chlorhexidine Gluconate Cloth 2 % PADS 6 each  6 each Topical Daily Scatliffe, Kristen D, MD      . folic acid injection 1 mg  1 mg Intravenous Daily Corey Harold, NP   1 mg at 08/16/19 1118  . magnesium sulfate IVPB 2 g 50 mL  2 g Intravenous Once Chesley Mires, MD 50 mL/hr at 08/16/19 1447 2 g at 08/16/19 1447  .  metroNIDAZOLE (FLAGYL) IVPB 500 mg  500 mg Intravenous Q8H Stretch, Marily Lente, MD 100 mL/hr at 08/16/19 1225 500 mg at 08/16/19 1225  . norepinephrine (LEVOPHED) 16 mg in 265mL premix infusion  0-40 mcg/min Intravenous Titrated Chesley Mires, MD      . pantoprazole (PROTONIX) injection 40 mg  40 mg Intravenous Q12H Corey Harold, NP      . phenylephrine CONCENTRATED 100mg  in sodium chloride 0.9% 275mL (0.4mg /mL) infusion  0-400 mcg/min Intravenous Titrated Sood, Vineet, MD      . sodium bicarbonate 150 mEq in dextrose 5% 1000 mL infusion  150 mEq Intravenous Continuous Corey Harold, NP 125 mL/hr at 08/16/19 1153 150 mEq at 08/16/19 1153  . thiamine (B-1) injection 100 mg  100 mg Intravenous Daily Corey Harold, NP      . Derrill Memo ON 08/17/2019] vancomycin (VANCOREADY) IVPB 750 mg/150 mL  750 mg Intravenous Q48H Gleason, Otilio Carpen, PA-C        Allergies: No  Known Allergies  Social History: Social History   Tobacco Use  . Smoking status: Former Smoker    Years: 12.00    Quit date: 05/04/1972    Years since quitting: 47.3  . Smokeless tobacco: Never Used  Substance Use Topics  . Alcohol use: No    Alcohol/week: 0.0 standard drinks    Comment: hx alcohol abuse-- per pt  none since 2013.  . Drug use: No    Comment: Previous IV drug user ovr 30 yrs ago--  per pt quit 1980's    Family History Family History  Problem Relation Age of Onset  . Colon cancer Neg Hx   . Liver disease Neg Hx     Review of Systems 10 systems were reviewed and are negative except as noted specifically in the HPI.  Objective   Vital signs in last 24 hours: BP 108/62   Pulse (!) 114   Temp 98.8 F (37.1 C) (Axillary)   Resp (!) 32   Ht 5\' 10"  (1.778 m)   Wt 53.7 kg   SpO2 100%   BMI 16.99 kg/m   Physical Exam General Appearance: Minimally responsive pulmonary: Normal respiratory effort on nasal cannula Cardiovascular: Intermittently tachycardic Abdomen: 20 French suprapubic tube.  No evidence of  infection around site. GU: Suprapubic tube DID NOT flush initially.  Now with new 20 French suprapubic tube  neurologic: Responded to painful stimuli    Most Recent Labs: Lab Results  Component Value Date   WBC 29.8 (H) 08/20/2019   HGB 11.9 (L) 08/16/2019   HCT 35.2 (L) 08/16/2019   PLT 476 (H) 08/26/2019    Lab Results  Component Value Date   NA 152 (H) 08/16/2019   K 3.5 08/16/2019   CL 126 (H) 08/16/2019   CO2 8 (L) 08/16/2019   BUN 77 (H) 08/16/2019   CREATININE 4.51 (H) 08/16/2019   CALCIUM 6.8 (L) 08/16/2019   MG 1.7 08/16/2019   PHOS 2.6 08/16/2019    Lab Results  Component Value Date   INR 1.6 (H) 08/16/2019   APTT 43 (H) 08/16/2019     IMAGING: CT ABDOMEN PELVIS WO CONTRAST  Result Date: 08/16/2019 CLINICAL DATA:  Sepsis, CKD stage 3, ETOH abuse in remission, IV drug abuse EXAM: CT ABDOMEN AND PELVIS WITHOUT CONTRAST TECHNIQUE: Multidetector CT imaging of the abdomen and pelvis was performed following the standard protocol without IV contrast. COMPARISON:  CT February 10, 2019 FINDINGS: Lower chest: Multifocal patchy airspace disease in the lung bases most pronounced in the medial left lower lobe. Normal heart size. No pericardial effusion. Coronary artery calcifications are present. Hepatobiliary: Normal hepatic attenuation. No focal hepatic lesions. There is distention of the gallbladder with a calcified gallstone within the gallbladder lumen. No pericholecystic fluid or inflammation. No biliary ductal dilatation. Pancreas: Diffuse parenchymal calcifications of the pancreas with larger coarse calcifications of the pancreatic head. No peripancreatic inflammation or visible ductal dilatation. Spleen: Normal in size without focal abnormality. Adrenals/Urinary Tract: Normal adrenal glands. Mild bilateral symmetric perinephric stranding, a nonspecific finding though may correlate with either age or decreased renal function given similarity to comparison study. Calculus present  in the left renal pelvis does not appear frankly obstructive in the absence of hydronephrosis. No visible or contour deforming renal lesions no frankly obstructive urolithiasis or hydronephrosis. Bladder is decompressed by a an inflated suprapubic catheter. Radiodense calcifications are present in the urinary bladder lumen which are similar to slightly increased in size from comparison study February 10, 2019. Stomach/Bowel:  Distal esophagus, stomach and duodenal sweep are unremarkable. No proximal small bowel dilatation or wall thickening. Mild circumferential thickening of the terminal ileum with pericolonic stranding. The appendix is not visualized. Mild diffuse colonic wall thickening and pericolonic stranding. Findings extend to the level of the left lower quadrant end colostomy. There has been interval reduction of a peristomal hernia seen on comparison CT. There is a terminal colonic pouch which closely apposes the posterior wall of the urinary bladder but without focal inflammation of this segment of bowel. Vascular/Lymphatic: Atherosclerotic plaque within the normal caliber aorta. Some reactive mesenteric nodes are present. No pathologically enlarged abdominopelvic lymph nodes. Reproductive: Stranding and postsurgical changes in the prostatectomy bed, likely reflecting scarring. Other: Small amount of fluid within the left inguinal canal. Trace fluid also present within a tiny umbilical hernia as well. There is mid mesenteric stranding likely reflective of the bowel inflammation detailed above. No free fluid or free air is seen in the abdomen or pelvis. Left lower quadrant ostomy is detailed above. Mild circumferential body wall edema is noted. Musculoskeletal: Multilevel degenerative changes are present in the imaged portions of the spine. No acute osseous abnormality or suspicious osseous lesion. IMPRESSION: 1. Multifocal patchy airspace disease in the lung bases most pronounced in the medial left lower lobe  concerning for multifocal pneumonia. 2. Mild diffuse colonic wall thickening and pericolonic stranding extending to the level of the left lower quadrant end colostomy. Findings are compatible with enterocolitis which may be infectious or inflammatory in etiology. 3. Cholelithiasis without CT evidence of acute cholecystitis. 4. Changes of chronic pancreatitis. 5. Coarse radiodense calcifications seen at the bladder base, possibly intraluminal or postsurgical related to prior prostatectomy. 6. Terminal colonic pouch remains in close proximity of the posterior wall the bladder with history of prior fistula. 7. Interval reduction of a peristomal hernia seen on comparison CT. 8. Aortic Atherosclerosis (ICD10-I70.0). Electronically Signed   By: Lovena Le M.D.   On: 08/16/2019 04:50   CT HEAD WO CONTRAST  Result Date: 08/16/2019 CLINICAL DATA:  79 year old male with encephalopathy. EXAM: CT HEAD WITHOUT CONTRAST TECHNIQUE: Contiguous axial images were obtained from the base of the skull through the vertex without intravenous contrast. COMPARISON:  04/18/2017 head CT. FINDINGS: Brain: Low-density left subdural hematoma seen in 2018 is resolved. Some generalized cerebral volume loss is suspected since that time. No midline shift, mass effect, or evidence of intracranial mass lesion. No acute intracranial hemorrhage identified. No ventriculomegaly. There is conspicuous hypodensity in the right pons on 2 consecutive images (series 2, images 11 and 12) new since 2018. Elsewhere there is patchy cerebral white matter hypodensity which appears not significantly changed from 2018. No cortically based cerebral hemisphere infarct or cortical encephalomalacia identified. Vascular: Mild Calcified atherosclerosis at the skull base. No suspicious intracranial vascular hyperdensity. Skull: Negative. Sinuses/Orbits: There is some chronic sinus periosteal thickening but the paranasal sinuses and mastoids are clear. Other: No acute  orbit or scalp soft tissue finding. IMPRESSION: 1. 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 Query new left side weakness. 2. No other acute intracranial abnormality identified. Electronically Signed   By: Genevie Ann M.D.   On: 08/16/2019 04:39   DG Chest Port 1 View  Result Date: 08/17/2019 CLINICAL DATA:  Sepsis EXAM: PORTABLE CHEST 1 VIEW COMPARISON:  February 10, 2019 FINDINGS: The heart size and mediastinal contours are within normal limits. Aortic knob calcifications. A right-sided PICC seen with the tip at the superior  cavoatrial junction. No pneumothorax or large airspace consolidation. No acute osseous abnormality. IMPRESSION: Right-sided PICC with the tip at the superior cavoatrial junction. Electronically Signed   By: Prudencio Pair M.D.   On: 08/28/2019 21:32   DG Knee Right Port  Result Date: 08/16/2019 CLINICAL DATA:  79 year old male with air a male a. EXAM: PORTABLE RIGHT KNEE - 1-2 VIEW COMPARISON:  Right knee series 07/15/2017. FINDINGS: AP and cross-table lateral views. Chronic right total knee arthroplasty. Increased lucency along the posterior margin of the femoral component. Evidence of small joint effusion. Hardware appears intact and normally aligned. Elsewhere Stable visualized osseous structures about the knee. IMPRESSION: 1. Nonspecific bone resorption along the posterior margin of the right femoral arthroplasty component since 2019. 2. Evidence of a small joint effusion. 3. No other acute radiographic finding. Electronically Signed   By: Genevie Ann M.D.   On: 08/16/2019 06:15    ------  Assessment:  79 y.o. 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  Currently managed with an end colostomy and SPtube.  Suprapubic tube last exchanged in June 2020 but is now completely encrusted.  Able to be removed with hemostat spread of the fascia/cystotomy.  New 20 French suprapubic catheter placed on 08/16/2019.  He  will likely drain part of his urine from the catheter and the other part through the fistula (rectum).  As long as his bladder is decompressed it does not matter which way the urine drains   Recommendations: -Suprapubic catheter to drainage  - If no urine output for 4 hours perform bladder scan to ensure he is emptying.  Call urology for volumes greater than 200 cc. -Discussed care with patient's son.  For now we will keep the suprapubic catheter in place but possibility of removing it to prevent further complications  Thank you for this consult. Please contact the urology consult pager with any further questions/concerns.

## 2019-08-16 NOTE — Progress Notes (Signed)
Unable to remove current suprapubic catheter. Resistance was met after deflating balloon. Deflated and retrieved 43m of water. Cath care was done. Referred to urologist. OAmanda Cockayne LPN

## 2019-08-16 NOTE — Progress Notes (Addendum)
NAME:  Colin [name], MRN:  [id number], DOB:  [birth year], LOS: 1 ADMISSION DATE:  08/23/2019, CONSULTATION DATE:  08/16/19  CHIEF COMPLAINT:  sepsis   Brief History   79 y.o. M with past medical history significant for diabetes, CKD stage III, EtOH abuse in remission, IV drug abuse and admission, diabetes, prostate cancer, bowel obstruction status post diverting colostomy indwelling suprapubic catheter who lives alone and who had not been heard from and around 1 day and was found confused in bed covered in feces.  Septic and acidotic and transferred to [facility] for ICU admission.  History of present illness   Colin [name] is a 79 year old male with past medical history significant for diabetes, CKD stage III, EtOH abuse in remission, IV drug abuse and admission, diabetes, prostate cancer, bowel obstruction status post diverting colostomy who lives alone.  Per son, he mostly stays in bed but will get up to perform ADLs and go to the store occasionally.  His dog died 2 weeks ago and he seemed more depressed, son went to see him on February 3rd and patient did not seem to be feeling very well.  He checked him on him again on February 6th and patient was able to eat but was not very energetic, at that time was alert and oriented.  The next day he was unable to reach patient by phone, so went to check on him and found him in bed covered in feces and minimally responsive brought into the emergency department.   In the ED, patient was initially very hypotensive with blood pressure 50/30 and hypothermic.  Labs significant for significant metabolic acidosis, hyponatremia with sodium of 150, white blood cell count 29.8, lactic acid 0.4 and creatinine 5.15 above baseline of 1.2-1.6.  Covid-19 and respiratory viral panel were negative, chest x-ray without infiltrates, no urine obtained from Foley bag.  He was covered with Vanc, cefepime and Flagyl and given 30 cc/kg IV fluids and transferred to [facility] for ICU  admission.   Past Medical History   has a past medical history of Arthritis, Bilateral lower extremity edema, Chronic venous insufficiency, CKD (chronic kidney disease), stage III ([place]), History of alcohol abuse, History of chronic hepatitis, History of external beam radiation therapy, History of intravenous drug use in remission, HOH (hard of hearing), Hypertension, Normocytic anemia, Recurrent prostate cancer [facility]) (urologist-  dr Junious Silk), and Type 2 diabetes, diet controlled ([place]).   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 New since 2018.  CT AP 2/8 > multifocal airspace disease in the lung bases. Diffuse colonic wall thickening. Cholelithiasis without evidence of cholecystitis. Changes consistent with chronic pancreatitis. Terminal colonic pouch remains in close proximity of the posterior wall the bladder with history of prior fistula. MRI brain 2/8 >  Micro Data:  2/7 Sars-CoV-2 and flu > negative 2/7 BCx2 >> 2/7 UC >>  Antimicrobials:  Cefepime 2/7 > Vancomycin 2/7 > Flagyl 2/7 >  Interim history/subjective:  Remains encephalopathic. Tachycardic up to the 150s transiently. Acidosis improving.  Objective   Blood pressure (!) 87/71, pulse 88, temperature (!) 93.7 F (34.3 C), temperature source Axillary, resp. rate (!) 25, height 5\' 10"  (1.778 m), weight 53.7 kg, SpO2 100 %.        Intake/Output Summary (Last 24 hours)  at 08/16/2019 0730 Last data filed at 08/16/2019 0600 Gross per 24 hour  Intake 2724.71 ml  Output 0 ml  Net 2724.71 ml   Filed Weights   09/04/2019 2000 08/11/2019 2011 08/16/19 0500  Weight: 55 kg 55 kg 53.7 kg   Physical Exam General:  Frail elderly male in NAD Neuro:  Awake, alert, but oriented x 0.  HEENT:  [place]/AT, No JVD noted, PERRL Cardiovascular:  RRR, no  MRG Lungs:  Clear respirations. Deep rapid breathing.  Abdomen:  Soft, concave. Some guarding noted. Colostomy and suprapubic catheter.  Musculoskeletal:  No acute deformity Skin:  Sacral stage 1.  Pressure Injury 08/16/19 Sacrum Medial Deep Tissue Pressure Injury - Purple or maroon localized area of discolored intact skin or blood-filled blister due to damage of underlying soft tissue from pressure and/or shear. (Active)  08/16/19 0200  Location: Sacrum  Location Orientation: Medial  Staging: Deep Tissue Pressure Injury - Purple or maroon localized area of discolored intact skin or blood-filled blister due to damage of underlying soft tissue from pressure and/or shear.  Wound Description (Comments):   Present on Admission: Yes     Pressure Injury 08/16/19 Back Upper;Left Stage 1 -  Intact skin with non-blanchable redness of a localized area usually over a bony prominence. (Active)  08/16/19 0200  Location: Back  Location Orientation: Upper;Left  Staging: Stage 1 -  Intact skin with non-blanchable redness of a localized area usually over a bony prominence.  Wound Description (Comments):   Present on Admission: Yes     Pressure Injury 08/16/19 Ischial tuberosity Right Deep Tissue Pressure Injury - Purple or maroon localized area of discolored intact skin or blood-filled blister due to damage of underlying soft tissue from pressure and/or shear. (Active)  08/16/19 0200  Location: Ischial tuberosity  Location Orientation: Right  Staging: Deep Tissue Pressure Injury - Purple or maroon localized area of discolored intact skin or blood-filled blister due to damage of underlying soft tissue from pressure and/or shear.  Wound Description (Comments):   Present on Admission: Yes     Pressure Injury 08/16/19 Ischial tuberosity Left Stage 2 -  Partial thickness loss of dermis presenting as a shallow open injury with a red, pink wound bed without slough. (Active)  08/16/19 0200  Location: Ischial  tuberosity  Location Orientation: Left  Staging: Stage 2 -  Partial thickness loss of dermis presenting as a shallow open injury with a red, pink wound bed without slough.  Wound Description (Comments):   Present on Admission: Yes               Resolved Hospital Problem list     Assessment & Plan:   Septic shock with unclear source: possibly right knee cellulitis but consider intra-abdominal etiology. CT demonstrates bibasilar pneumonia. 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 There is now erythema and edema with purulent drainage, no significant effusion on bedside needle tap. P: -Follow blood cultures, obtain UA and urine cultures if patient makes urine -Continue broad empiric coverage with vancomycin, flagyl and cefepime -Consider imaging of R knee. X-rays show only small effusion.  -Continued volume resuscitation -Levophed to keep MAP 65 > transition to Neo as tachycardia has been a reoccurring issue. -Trend lactic acid  Acute on chronic renal insufficiency: Creatinine 1.2-1.6 baseline, 5.15 on admission likely secondary to prerenal volume depletion Hypokalemia P: - Give volume - replete K - may warrant nephrology consultation if repeat labs not improved.   Anion  gap metabolic acidosis: Multifactorial. Lactic acidosis, uremia, and likely starvation ketoacidosis. Consider euglycemic DKA P: -Volume resuscitation -Follow BMP, ABG -Give dextrose -Have to hold off on insulin infusion as K is 2.7.   Hypovolemic hypernatremia: NA 150 on arrival P: -Follow every 4 hours BMP after IV fluids to avoid overcorrection, goal reduction 8 to 10 mmol in 24 hours - Repeat sodium higher, will start d5  Encephalopathy: Baseline alert and oriented per son, likely metabolic P: - neuro checks - Follow closely as we correct his metabolic abnormalities.   Failure to thrive: Stage I decubitus ulcer and poor nutrition P: -We will  likely need case management consult and possible placement -At risk for re-feeding syndrome.   Acute vs chronic pancreatitis: findings described on CT and lipase mildly elevated - NPO  Type 2 diabetes -Hold home glipizide P: - CBG monitoring and SSI - Some concern for euglycemic DKA. Will hold off on insulin infusion for now.   GI bleed: Dark stool in colostomy bag, heme-positive P: - Protonix BID   Best practice:  Diet: NPO Pain/Anxiety/Delirium protocol (if indicated): N/A VAP protocol (if indicated): N/A DVT prophylaxis: SCDs GI prophylaxis: Protonix BID Glucose control: SSI Mobility: Bedrest Code Status: Full code Family Communication: Son updated over phone. Indicates the patient should be DNR and would likely not want RRT.  Disposition: ICU  Labs   CBC: Recent Labs  Lab 08/13/2019 2021 08/16/19 0203 08/16/19 0557  WBC 29.8*  --   --   NEUTROABS 25.4*  --   --   HGB 13.1 13.3 10.2*  HCT 41.3 39.0 30.0*  MCV 89.8  --   --   PLT 476*  --   --     Basic Metabolic Panel: Recent Labs  Lab 08/31/2019 2021 08/16/19 0203 08/16/19 0214 08/16/19 0557  NA 150* 153*  --  155*  K 4.0 3.4*  --  2.7*  CL 129*  --   --   --   CO2 <7*  --   --   --   GLUCOSE 171*  --   --   --   BUN 90*  --   --   --   CREATININE 5.15*  --   --   --   CALCIUM 8.4*  --   --   --   MG  --   --  2.4  --    GFR: Estimated Creatinine Clearance: 9.1 mL/min (A) (by C-G formula based on SCr of 5.15 mg/dL (H)). Recent Labs  Lab 08/25/2019 2021 08/16/19 0237  WBC 29.8*  --   LATICACIDVEN 3.4* 1.8    Liver Function Tests: Recent Labs  Lab 08/09/2019 2021  AST 22  ALT 20  ALKPHOS 153*  BILITOT 0.8  PROT 6.3*  ALBUMIN 2.8*   Recent Labs  Lab 08/16/19 0214  LIPASE 53*   Recent Labs  Lab 08/16/19 0214  AMMONIA 49*    ABG    Component Value Date/Time   PHART 7.273 (L) 08/16/2019 0557   PCO2ART <15.0 (LL) 08/16/2019 0557   PO2ART 66.0 (L) 08/16/2019 0557   HCO3 7.1 (L)  08/16/2019 0557   TCO2 8 (L) 08/16/2019 0557   ACIDBASEDEF 18.0 (H) 08/16/2019 0557   O2SAT 93.0 08/16/2019 0557     Coagulation Profile: No results for input(s): INR, PROTIME in the last 168 hours.  Cardiac Enzymes: No results for input(s): CKTOTAL, CKMB, CKMBINDEX, TROPONINI in the last 168 hours.  HbA1C: Hgb A1c MFr Bld  Date/Time  Value Ref Range Status  02/10/2019 01:16 PM 8.8 (H) 4.8 - 5.6 % Final    Comment:    (NOTE) Pre diabetes:          5.7%-6.4% Diabetes:              >6.4% Glycemic control for   <7.0% adults with diabetes   12/29/2009 09:30 AM (H) <5.7 % Final   9.6 (NOTE)                                                                       According to the ADA Clinical Practice Recommendations for 2011, when HbA1c is used as a screening test:   >=6.5%   Diagnostic of Diabetes Mellitus           (if abnormal result  is confirmed)  5.7-6.4%   Increased risk of developing Diabetes Mellitus  References:Diagnosis and Classification of Diabetes Mellitus,Diabetes [id number] 1):S62-S69 and Standards of Medical Care in         Diabetes - 2011,Diabetes Care,2011,34  (Suppl 1):S11-S61.    CBG: Recent Labs  Lab 08/30/2019 2008 08/16/19 0229  GLUCAP 85 148*     Critical care time: 45 minutes     CRITICAL CARE Performed by: Corey Harold   Total critical care time: 65 minutes  Critical care time was exclusive of separately billable procedures and treating other patients.  Critical care was necessary to treat or prevent imminent or life-threatening deterioration.  Critical care was time spent personally by me on the following activities: development of treatment plan with patient and/or surrogate as well as nursing, discussions with consultants, evaluation of patient's response to treatment, examination of patient, obtaining history from patient or surrogate, ordering and performing treatments and interventions, ordering and review of laboratory studies,  ordering and review of radiographic studies, pulse oximetry and re-evaluation of patient's condition.   Georgann Housekeeper, AGACNP-BC [place]  See Amion for personal pager PCCM on call pager [phone]  08/16/2019 8:02 AM

## 2019-08-16 NOTE — Progress Notes (Signed)
CSW received consult for patient to identify resources that he may benefit from and concerns for possible unsafe living situation. Per notes, patient was found by his son in his home covered in feces and unresponsive.   CSW will meet with patient to complete full assessment after his mentation and speech become more clear.  Madilyn Fireman, MSW, LCSW-A Transitions of Care  Clinical Social Worker  [facility] Emergency Departments  Medical ICU [phone]

## 2019-08-16 NOTE — Consult Note (Signed)
[place] Nurse ostomy follow up Patient receiving care in [facility] 2M15.  Primary RN present at the time of my visit Stoma type/location: LUQ colostomy Stomal assessment/size: 1 1/8 inches, round, slightly budded.  Urostomy pouch in place at the time of my visit. Pouch is almost full of liquid black effluent. Peristomal assessment: deferred.  Supplies have to be ordered and obtained from [employer]. Treatment options for stomal/peristomal skin: barrier ring Output as described Ostomy pouching: 2pc. high output pouch order placed  No wound care needs identified by primary RN at the time of my visit. Val Riles, RN, MSN, CWOCN, CNS-BC, pager [phone]

## 2019-08-16 NOTE — Progress Notes (Signed)
[facility] Progress Note Patient Name: Colin [name] DOB: 06/14/1941 MRN: [id number]   Date of Service  08/16/2019  HPI/Events of Note  K+ 3.0, CO2 9  eICU Interventions  KCL 10 meq iv Q 1 hour x 3, Sodium bicarbonate 50 meq iv x 1        Javel Hersh U Kenidy [place] 08/16/2019, 11:15 PM

## 2019-08-17 ENCOUNTER — Inpatient Hospital Stay (HOSPITAL_COMMUNITY): Payer: Medicare Other

## 2019-08-17 DIAGNOSIS — E43 Unspecified severe protein-calorie malnutrition: Secondary | ICD-10-CM | POA: Insufficient documentation

## 2019-08-17 LAB — BASIC METABOLIC PANEL
Anion gap: 12 (ref 5–15)
Anion gap: 15 (ref 5–15)
BUN: 71 mg/dL — ABNORMAL HIGH (ref 8–23)
BUN: 61 mg/dL — ABNORMAL HIGH (ref 8–23)
CO2: 18 mmol/L — ABNORMAL LOW (ref 22–32)
CO2: 25 mmol/L (ref 22–32)
Calcium: 6.5 mg/dL — ABNORMAL LOW (ref 8.9–10.3)
Calcium: 6.2 mg/dL — CL (ref 8.9–10.3)
Chloride: 123 mmol/L — ABNORMAL HIGH (ref 98–111)
Chloride: 114 mmol/L — ABNORMAL HIGH (ref 98–111)
Creatinine, Ser: 4.4 mg/dL — ABNORMAL HIGH (ref 0.61–1.24)
Creatinine, Ser: 3.72 mg/dL — ABNORMAL HIGH (ref 0.61–1.24)
GFR calc Af Amer: 14 mL/min — ABNORMAL LOW (ref >60)
GFR calc Af Amer: 17 mL/min — ABNORMAL LOW (ref >60)
GFR calc non Af Amer: 12 mL/min — ABNORMAL LOW (ref >60)
GFR calc non Af Amer: 15 mL/min — ABNORMAL LOW (ref >60)
Glucose, Bld: 313 mg/dL — ABNORMAL HIGH (ref 70–99)
Glucose, Bld: 258 mg/dL — ABNORMAL HIGH (ref 70–99)
Potassium: 3 mmol/L — ABNORMAL LOW (ref 3.5–5.1)
Potassium: 2.6 mmol/L — CL (ref 3.5–5.1)
Sodium: 153 mmol/L — ABNORMAL HIGH (ref 135–145)
Sodium: 154 mmol/L — ABNORMAL HIGH (ref 135–145)

## 2019-08-17 LAB — GLUCOSE, CAPILLARY
Glucose-Capillary: 219 mg/dL — ABNORMAL HIGH (ref 70–99)
Glucose-Capillary: 230 mg/dL — ABNORMAL HIGH (ref 70–99)
Glucose-Capillary: 201 mg/dL — ABNORMAL HIGH (ref 70–99)
Glucose-Capillary: 218 mg/dL — ABNORMAL HIGH (ref 70–99)
Glucose-Capillary: 140 mg/dL — ABNORMAL HIGH (ref 70–99)

## 2019-08-17 LAB — CBC
HCT: 29.6 % — ABNORMAL LOW (ref 39.0–52.0)
Hemoglobin: 10.7 g/dL — ABNORMAL LOW (ref 13.0–17.0)
MCH: 28.7 pg (ref 26.0–34.0)
MCHC: 36.1 g/dL — ABNORMAL HIGH (ref 30.0–36.0)
MCV: 79.4 fL — ABNORMAL LOW (ref 80.0–100.0)
Platelets: 258 10*3/uL (ref 150–400)
RBC: 3.73 MIL/uL — ABNORMAL LOW (ref 4.22–5.81)
RDW: 16.9 % — ABNORMAL HIGH (ref 11.5–15.5)
WBC: 28.8 10*3/uL — ABNORMAL HIGH (ref 4.0–10.5)
nRBC: 0.5 % — ABNORMAL HIGH (ref 0.0–0.2)

## 2019-08-17 LAB — MAGNESIUM
Magnesium: 2.1 mg/dL (ref 1.7–2.4)
Magnesium: 1.9 mg/dL (ref 1.7–2.4)

## 2019-08-17 LAB — HEMOGLOBIN A1C
Hgb A1c MFr Bld: 5.7 % — ABNORMAL HIGH (ref 4.8–5.6)
Hgb A1c MFr Bld: 5.8 % — ABNORMAL HIGH (ref 4.8–5.6)
Mean Plasma Glucose: 117 mg/dL
Mean Plasma Glucose: 120 mg/dL

## 2019-08-17 LAB — PHOSPHORUS
Phosphorus: <1 mg/dL — CL (ref 2.5–4.6)
Phosphorus: 2.9 mg/dL (ref 2.5–4.6)

## 2019-08-17 LAB — CORTISOL: Cortisol, Plasma: 36.7 ug/dL

## 2019-08-17 MED ORDER — POTASSIUM PHOSPHATES 15 MMOLE/5ML IV SOLN
30.0000 mmol | Freq: Once | INTRAVENOUS | Status: AC
  Administered 2019-08-17: 30 mmol via INTRAVENOUS
  Filled 2019-08-17: qty 10

## 2019-08-17 MED ORDER — VITAL AF 1.2 CAL PO LIQD
1000.0000 mL | ORAL | Status: DC
  Administered 2019-08-17 – 2019-08-18 (×2): 1000 mL

## 2019-08-17 MED ORDER — VITAL HIGH PROTEIN PO LIQD: 1000.0000 mL | ORAL | Status: DC

## 2019-08-17 MED ORDER — PRO-STAT SUGAR FREE PO LIQD: 30.0000 mL | Freq: Two times a day (BID) | ORAL | Status: DC

## 2019-08-17 MED ORDER — INSULIN ASPART 100 UNIT/ML ~~LOC~~ SOLN
2.0000 [IU] | SUBCUTANEOUS | Status: DC
  Administered 2019-08-17 (×2): 2 [IU] via SUBCUTANEOUS

## 2019-08-17 MED ORDER — POTASSIUM CHLORIDE 20 MEQ/15ML (10%) PO SOLN
40.0000 meq | Freq: Four times a day (QID) | ORAL | Status: AC
  Administered 2019-08-17 – 2019-08-18 (×3): 40 meq
  Filled 2019-08-17 (×3): qty 30

## 2019-08-17 MED ORDER — FREE WATER
200.0000 mL | Freq: Four times a day (QID) | Status: DC
  Administered 2019-08-17 – 2019-08-18 (×4): 200 mL

## 2019-08-17 MED ORDER — INSULIN ASPART 100 UNIT/ML ~~LOC~~ SOLN
2.0000 [IU] | SUBCUTANEOUS | Status: DC
  Administered 2019-08-17 (×3): 6 [IU] via SUBCUTANEOUS

## 2019-08-17 MED ORDER — POTASSIUM CHLORIDE 10 MEQ/50ML IV SOLN
10.0000 meq | INTRAVENOUS | Status: AC
  Administered 2019-08-17 (×4): 10 meq via INTRAVENOUS
  Filled 2019-08-17 (×3): qty 50

## 2019-08-17 MED ORDER — INSULIN ASPART 100 UNIT/ML ~~LOC~~ SOLN
0.0000 [IU] | SUBCUTANEOUS | Status: DC
  Administered 2019-08-17: 13:00:00 5 [IU] via SUBCUTANEOUS
  Administered 2019-08-17: 2 [IU] via SUBCUTANEOUS
  Administered 2019-08-17 – 2019-08-18 (×3): 5 [IU] via SUBCUTANEOUS

## 2019-08-17 MED ORDER — DEXTROSE-NACL 5-0.45 % IV SOLN: INTRAVENOUS | Status: DC

## 2019-08-17 NOTE — Progress Notes (Signed)
Initial Nutrition Assessment  DOCUMENTATION CODES:   Severe malnutrition in context of chronic illness  INTERVENTION:    Vital AF 1.2 at 25 ml/h increase by 10 ml every 12 hours to goal rate of 65 ml/h (1560 ml per day)   Provides 1872 kcal, 117 gm protein, 1265 ml free water daily   Monitor magnesium, potassium, and phosphorus levels, MD to replete as needed, as pt is at risk for refeeding syndrome given severe PCM.  NUTRITION DIAGNOSIS:   Severe Malnutrition related to chronic illness(CKD) as evidenced by severe fat depletion, severe muscle depletion, percent weight loss(13% weight loss within 6 months).  GOAL:   Patient will meet greater than or equal to 90% of their needs  MONITOR:   TF tolerance, Skin, Labs  REASON FOR ASSESSMENT:   Consult Enteral/tube feeding initiation and management  ASSESSMENT:   79 yo male admitted after being found at home, confused in bed, and covered with feces. PMH includes DM-2, HOH, HTN, CKD 3b, ETOH and IV drug abuse in remission, prostate cancer, bowel obstruction, colostomy.   Patient remains encephalopathic. CT abd 2/8 showed chronic pancreatitis. MRI brain 2/8 showed no acute infarct, moderate atrophy Requiring pressors to keep MAP >65. Per MD discussion with family, patient would likely not want RRT and should be DNR.   Cortrak was placed today, tip is in the stomach.  Labs reviewed. Phos <1 (L), mag 2.1 WNL, K 3 (L), Na 153 (H) CBG's: 219-230-201  Medications reviewed and include folic acid, novolog, thiamine, protonix, phenylephrine, levophed, K Phos, sodium bicarb.  Patient has lost 13% of usual weight within the past 6 months, which is significant for the time frame. Severe depletion of muscle and subcutaneous fat mass noted. Patient is severely malnourished and at risk for refeeding syndrome.   NUTRITION - FOCUSED PHYSICAL EXAM:    Most Recent Value  Orbital Region  Severe depletion  Upper Arm Region  Severe  depletion  Thoracic and Lumbar Region  Severe depletion  Buccal Region  Severe depletion  Temple Region  Severe depletion  Clavicle Bone Region  Severe depletion  Clavicle and Acromion Bone Region  Severe depletion  Scapular Bone Region  Severe depletion  Dorsal Hand  Severe depletion  Patellar Region  Severe depletion  Anterior Thigh Region  Severe depletion  Posterior Calf Region  Severe depletion  Edema (RD Assessment)  Moderate  Hair  Reviewed  Eyes  Unable to assess  Mouth  Unable to assess  Skin  Reviewed  Nails  Reviewed       Diet Order:   Diet Order            Diet NPO time specified  Diet effective now              EDUCATION NEEDS:   Not appropriate for education at this time  Skin:  Skin Assessment: Skin Integrity Issues: Skin Integrity Issues:: Stage I, Stage II, DTI DTI: R ischial tuberosity, sacrum Stage I: L upper back Stage II: L ischial tuberosity  Last BM:  2/9 (colostomy)  Height:   Ht Readings from Last 1 Encounters:  08/31/2019 5\' 10"  (1.778 m)    Weight:   Wt Readings from Last 1 Encounters:  08/17/19 60.2 kg    Ideal Body Weight:  75.5 kg  BMI:  Body mass index is 19.04 kg/m.  Estimated Nutritional Needs:   Kcal:  1800-2100  Protein:  90-110 gm  Fluid:  >/= 1.8 L    Molli Barrows,  RD, LDN, CNSC Contact information can be found in Amion.

## 2019-08-17 NOTE — Progress Notes (Signed)
1600- Patient HR sustained to 140-150s. BP dropped. Patient on quad strength phenylephrine. Increased drip by 10 mcg. BP increased to 82/58 (67). Patient has increased use of accessory muscle to breath. On room air, dropping 02 sats to 80s. Placed 6L nasal canula, still not maintaining. MD Sood called to bedside to assess- ordered a 12 lead. Patient placed on NRB 15L. Oxygen increased to about high 88-89%. Called MD Sood about oxygen demand and the rhonci. Stated no chest xray and stated he was going to call the son to update about father's decline. Will continue to monitor.

## 2019-08-17 NOTE — Progress Notes (Signed)
eLink Physician-Brief Progress Note Patient Name: Colin [name] DOB: [birth year] MRN: [id number]   Date of Service  08/17/2019  HPI/Events of Note  Patient continues to decline,  son wanted to discuss next steps, per my conversation with him, his sister is coming in tomorrow, and he would like to defer any decisions until then, Pt remains DNR/ DNI but current active measures to continue pending his sister's arrival.  eICU Interventions  No change in current plan.        Kerry Kass Naven Giambalvo 08/17/2019, 8:46 PM

## 2019-08-17 NOTE — Progress Notes (Addendum)
Reviewed MRI brain: No acute/subacute stroke seen. The hypodensity in pons on CT is artifactual. This is not unexpected as evaluation of brainstem/posterior fossa lesions is usually limited on CT scan.   Suspect altered mental status due to patient's sepsis, hyperammonemia, uremia and other metabolic abnormalities. We will be available if there any questions and if there is no clinical improvement despite corrected of the above medical conditions.

## 2019-08-17 NOTE — Progress Notes (Signed)
NAME:  Colin [name], MRN:  [id number], DOB:  03/04/1941, LOS: 2 ADMISSION DATE:  08/18/2019, CONSULTATION DATE:  08/17/19  CHIEF COMPLAINT:  sepsis   Brief History   79 yo male found confused at home covered in feces.  In ER noted to have hypotension, acidosis, and concerns for septic shock.  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  CT AP 2/8 > multifocal airspace disease in the lung bases. Diffuse colonic wall thickening. Cholelithiasis without evidence of cholecystitis. Changes consistent with chronic pancreatitis. Terminal colonic pouch remains in close proximity of the posterior wall the bladder with history of prior fistula. MRI brain 2/8 > no acute infarct, moderate atrophy  Micro Data:  2/7 Sars-CoV-2 and flu > negative 2/7 BCx2 >> 2/7 UC >> 2/8 C diff >> negative  Antimicrobials:  Cefepime 2/7 > Vancomycin 2/7 > 2/9 Flagyl 2/7 >  Interim history/subjective:  Remains on pressors.  Objective   Blood pressure (!) 74/60, pulse 78, temperature 98.2 F (36.8 C), temperature source Axillary, resp. rate 14, height 5\' 10"  (1.778 m), weight 60.2 kg, SpO2 100 %.        Intake/Output Summary (Last 24 hours) at 08/17/2019 0925 Last data filed at 08/17/2019 0800 Gross per 24 hour  Intake 3555.6 ml  Output 1507 ml  Net 2048.6 ml   Filed Weights   08/10/2019 2011 08/16/19 0500 08/17/19 0500  Weight: 55 kg 53.7 kg 60.2 kg   Physical Exam  General - non verbal, cachectic Eyes - pupils reactive ENT - no sinus tenderness, no stridor Cardiac - regular rate/rhythm, no murmur Chest - equal breath sounds b/l, no wheezing or rales Abdomen - soft, non tender, + bowel  sounds, suprapubic catheter and colostomy in place Extremities - decreased muscle bulk Skin - pressure ulcers Neuro - moves extremities, not following commands   Resolved Hospital Problem list   Lactic acidosis  Assessment & Plan:   Septic shock. - likely from UTI, Rt knee cellulitis, and/or colitis - day 3 of ABx - pressors to keep MAP > 65 - check cortisol  AKI from hypovolemia. Hypernatremia. Hypokalemia, hypophosphatemia, hypomagnesemia. Non gap metabolic acidosis. - continue HCO3 in IV fluid - f/u BMET - replace electrolytes - defer renal replacement >> family doesn't think he would want HD  Suprapubic catheter. - replaced by urology [id number]  Acute metabolic encephalopathy 2nd to uremia and sepsis. - monitor mental status  Severe protein calorie malnutrition with hx of chronic pancreatitis. - place cortrak and start tube feeds  DM type 2 poorly controlled. - SSI  Upper GI bleed likely from uremia induced gastritis. Hx of colostomy. - protonix BID - colostomy care  Anemia of critical illness and chronic disease. - f/u CBC - transfuse for Hb < 7 or significant bleeding   Pressure injuries. - sacrum, present on admission - Lt upper back, present on admission - Rt ischial tuberosity, present on admission - Lt ischial tuberosity, present on admission - wound care   Best practice:  Diet: NPO DVT prophylaxis: SCDs GI prophylaxis: Protonix BID Mobility: Bedrest Code Status: DNR, no renal replacement Disposition: ICU  Labs    CMP Latest Ref  Rng & Units 08/17/2019 08/16/2019 08/16/2019  Glucose 70 - 99 mg/dL 313(H) 291(H) 247(H)  BUN 8 - 23 mg/dL 71(H) 75(H) 75(H)  Creatinine 0.61 - 1.24 mg/dL 4.40(H) 4.32(H) 4.48(H)  Sodium 135 - 145 mmol/L 153(H) 148(H) 153(H)  Potassium 3.5 - 5.1 mmol/L 3.0(L) 2.7(LL) 3.0(L)  Chloride 98 - 111 mmol/L 123(H) 120(H) 125(H)  CO2 22 - 32 mmol/L 18(L) 14(L) 9(L)  Calcium 8.9 - 10.3 mg/dL 6.5(L) 6.4(LL) 6.7(L)  Total Protein  6.5 - 8.1 g/dL - - -  Total Bilirubin 0.3 - 1.2 mg/dL - - -  Alkaline Phos 38 - 126 U/L - - -  AST 15 - 41 U/L - - -  ALT 0 - 44 U/L - - -    CBC Latest Ref Rng & Units 08/17/2019 08/16/2019 08/16/2019  WBC 4.0 - 10.5 K/uL 28.8(H) - -  Hemoglobin 13.0 - 17.0 g/dL 10.7(L) 11.4(L) 11.9(L)  Hematocrit 39.0 - 52.0 % 29.6(L) 33.0(L) 35.2(L)  Platelets 150 - 400 K/uL 258 - -    ABG    Component Value Date/Time   PHART 7.364 08/16/2019 1200   PCO2ART <19.0 (LL) 08/16/2019 1200   PO2ART 110 (H) 08/16/2019 1200   HCO3 6.7 (L) 08/16/2019 1200   TCO2 8 (L) 08/16/2019 0557   ACIDBASEDEF 18.3 (H) 08/16/2019 1200   O2SAT 98.3 08/16/2019 1200    CBG (last 3)  Recent Labs    08/16/19 2334 08/17/19 0328 08/17/19 0748  GLUCAP 232* 219* 230*    CC time 34 minutes  Chesley Mires, MD [place] 08/17/2019, 9:41 AM

## 2019-08-17 NOTE — Progress Notes (Signed)
Inpatient Diabetes Program Recommendations  AACE/ADA: New Consensus Statement on Inpatient Glycemic Control   Target Ranges:  Prepandial:   less than 140 mg/dL      Peak postprandial:   less than 180 mg/dL (1-2 hours)      Critically ill patients:  140 - 180 mg/dL  Results for [name], Colin [name] (MRN [id number]) as of 08/17/2019 09:32  Ref. Range 08/16/2019 08:02 08/16/2019 11:54 08/16/2019 15:34 08/16/2019 19:32 08/16/2019 23:34 08/17/2019 03:28 08/17/2019 07:48  Glucose-Capillary Latest Ref Range: 70 - 99 mg/dL 104 (H) 105 (H) 185 (H) 229 (H) 232 (H) 219 (H) 230 (H)    Review of Glycemic Control  Diabetes history: DM2 Outpatient Diabetes medications: Glipizide 2.5 mg daily Current orders for Inpatient glycemic control: Novolog 2-6 units Q4H  Inpatient Diabetes Program Recommendations:   Insulin - Basal: Please consider ordering Levemir 9 units Q24H (based on 60 kg x 0.15 units).  ICU Glycemic Control order set: If Levemir is added as recommended and glucose remains consistently greater than 200 mg/dl, will need to transition to ICU Phase 2 (IV insulin) to improve glycemic control and help determine insulin needs.  Thanks, Barnie Alderman, RN, MSN, CDE Diabetes Coordinator Inpatient Diabetes Program [phone] (Team Pager from 8am to 5pm)

## 2019-08-17 NOTE — Procedures (Signed)
Cortrak  Person Inserting Tube:  Lyrik Buresh, Creola Corn, RD Tube Type:  Cortrak - 43 inches Tube Location:  Left nare Initial Placement:  Stomach Secured by: Bridle Technique Used to Measure Tube Placement:  Documented cm marking at nare/ corner of mouth Cortrak Secured At:  63 cm   Cortrak Tube Team Note:  Consult received to place a Cortrak feeding tube.  Pt required multiple staff members to assist with tube placement. X-ray obtained.  X-ray is required, abdominal x-ray has been ordered by the Cortrak team. Please confirm tube placement before using the Cortrak tube.   If the tube becomes dislodged please keep the tube and contact the Cortrak team at [email or web address] (password [email or web address]) for replacement.  If after hours and replacement cannot be delayed, place a NG tube and confirm placement with an abdominal x-ray.    Larkin Ina, MS, RD, LDN RD pager number and weekend/on-call pager number located in [place].

## 2019-08-17 NOTE — Progress Notes (Signed)
eLink Physician-Brief Progress Note Patient Name: Colin [name] DOB: [birth year] MRN: [id number]   Date of Service  08/17/2019  HPI/Events of Note  K+ 2.7, Calcium 6.4, hyperglycemia  eICU Interventions  Blood sugar correction protocol switched to standard scale, KCL being replaced,  Ionized calcium will be checked.        Kerry Kass Pearlena Ow 08/17/2019, 12:22 AM

## 2019-08-17 NOTE — Progress Notes (Signed)
Spoke with pt's son.  Updated about current status.  Explained that he appears to be getting worse and we should be prepared that he might continue to decline.  He will inform his sister, and then try to return to the hospital.  Pt is DNR/DNI.  Chesley Mires, MD [facility] Pulmonary/Critical Care 08/17/2019, 4:33 PM

## 2019-08-17 NOTE — Progress Notes (Signed)
CRITICAL VALUE ALERT  Critical Value:  Potassium 2.6 and calcium 6.2  Date & Time Notied:  2/9 1800  Provider Notified: MD Halford Chessman  Orders Received/Actions taken: awaiting orders

## 2019-08-18 LAB — COMPREHENSIVE METABOLIC PANEL
ALT: 18 U/L (ref 0–44)
AST: 28 U/L (ref 15–41)
Albumin: 1.7 g/dL — ABNORMAL LOW (ref 3.5–5.0)
Alkaline Phosphatase: 90 U/L (ref 38–126)
Anion gap: 18 — ABNORMAL HIGH (ref 5–15)
BUN: 57 mg/dL — ABNORMAL HIGH (ref 8–23)
CO2: 19 mmol/L — ABNORMAL LOW (ref 22–32)
Calcium: 6 mg/dL — CL (ref 8.9–10.3)
Chloride: 116 mmol/L — ABNORMAL HIGH (ref 98–111)
Creatinine, Ser: 3.63 mg/dL — ABNORMAL HIGH (ref 0.61–1.24)
GFR calc Af Amer: 18 mL/min — ABNORMAL LOW (ref >60)
GFR calc non Af Amer: 15 mL/min — ABNORMAL LOW (ref >60)
Glucose, Bld: 224 mg/dL — ABNORMAL HIGH (ref 70–99)
Potassium: 4.3 mmol/L (ref 3.5–5.1)
Sodium: 153 mmol/L — ABNORMAL HIGH (ref 135–145)
Total Bilirubin: 1.6 mg/dL — ABNORMAL HIGH (ref 0.3–1.2)
Total Protein: 4.7 g/dL — ABNORMAL LOW (ref 6.5–8.1)

## 2019-08-18 LAB — URINALYSIS, ROUTINE W REFLEX MICROSCOPIC
Bilirubin Urine: NEGATIVE
Glucose, UA: NEGATIVE mg/dL
Ketones, ur: NEGATIVE mg/dL
Nitrite: NEGATIVE
Protein, ur: 30 mg/dL — AB
Specific Gravity, Urine: 1.011 (ref 1.005–1.030)
WBC, UA: >50 WBC/hpf — ABNORMAL HIGH (ref 0–5)
pH: 7 (ref 5.0–8.0)

## 2019-08-18 LAB — CBC
HCT: 37 % — ABNORMAL LOW (ref 39.0–52.0)
Hemoglobin: 12.8 g/dL — ABNORMAL LOW (ref 13.0–17.0)
MCH: 28 pg (ref 26.0–34.0)
MCHC: 34.6 g/dL (ref 30.0–36.0)
MCV: 81 fL (ref 80.0–100.0)
Platelets: 205 10*3/uL (ref 150–400)
RBC: 4.57 MIL/uL (ref 4.22–5.81)
RDW: 17.7 % — ABNORMAL HIGH (ref 11.5–15.5)
WBC: 32 10*3/uL — ABNORMAL HIGH (ref 4.0–10.5)
nRBC: 0.4 % — ABNORMAL HIGH (ref 0.0–0.2)

## 2019-08-18 LAB — MAGNESIUM: Magnesium: 1.6 mg/dL — ABNORMAL LOW (ref 1.7–2.4)

## 2019-08-18 LAB — GLUCOSE, CAPILLARY
Glucose-Capillary: 110 mg/dL — ABNORMAL HIGH (ref 70–99)
Glucose-Capillary: 204 mg/dL — ABNORMAL HIGH (ref 70–99)
Glucose-Capillary: 224 mg/dL — ABNORMAL HIGH (ref 70–99)

## 2019-08-18 LAB — CALCIUM, IONIZED: Calcium, Ionized, Serum: 4.1 mg/dL — ABNORMAL LOW (ref 4.5–5.6)

## 2019-08-18 LAB — PHOSPHORUS: Phosphorus: 2 mg/dL — ABNORMAL LOW (ref 2.5–4.6)

## 2019-08-18 MED ORDER — SODIUM PHOSPHATES 45 MMOLE/15ML IV SOLN
10.0000 mmol | Freq: Once | INTRAVENOUS | Status: AC
  Administered 2019-08-18: 10 mmol via INTRAVENOUS
  Filled 2019-08-18: qty 3.33

## 2019-08-18 MED ORDER — MAGNESIUM SULFATE 2 GM/50ML IV SOLN
2.0000 g | Freq: Once | INTRAVENOUS | Status: AC
  Administered 2019-08-18: 2 g via INTRAVENOUS
  Filled 2019-08-18: qty 50

## 2019-08-18 MED ORDER — CALCIUM GLUCONATE-NACL 1-0.675 GM/50ML-% IV SOLN
1.0000 g | Freq: Once | INTRAVENOUS | Status: AC
  Administered 2019-08-18: 1000 mg via INTRAVENOUS
  Filled 2019-08-18: qty 50

## 2019-08-18 MED ORDER — FENTANYL 2500MCG IN NS 250ML (10MCG/ML) PREMIX INFUSION
0.0000 ug/h | INTRAVENOUS | Status: DC
  Administered 2019-08-18: 50 ug/h via INTRAVENOUS
  Administered 2019-08-18: 375 ug/h via INTRAVENOUS
  Filled 2019-08-18 (×2): qty 250

## 2019-08-18 NOTE — Progress Notes (Signed)
Patient is now comfort care. Fentanyl drip started. Pressors stopped. Family at bedside.

## 2019-08-18 NOTE — Progress Notes (Signed)
Received call from patient's son, Colin [name], stating that he is aware of his father's declining status. Would want to go comfort care measures but family wants to see patient first. I explained the visiting protocol. He understood. I updated MD Ruthann Cancer. Will continue to monitor.

## 2019-08-18 NOTE — Progress Notes (Signed)
NAME:  Colin [name], MRN:  [id number], DOB:  28-Jan-1941, LOS: 3 ADMISSION DATE:  08/29/2019, CONSULTATION DATE:  08/18/19  CHIEF COMPLAINT:  sepsis   Brief History   79 yo male found confused at home covered in feces.  In ER noted to have hypotension, hypothermia, acidosis, and concerns for septic shock.  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 Not seen on MRI and thought to be artifactual. CT AP 2/8 > multifocal airspace disease in the lung bases. Diffuse colonic wall thickening. Cholelithiasis without evidence of cholecystitis. Changes consistent with chronic pancreatitis. Terminal colonic pouch remains in close proximity of the posterior wall the bladder with history of prior fistula. MRI brain 2/8 > no acute infarct, moderate atrophy  Micro Data:  2/7 Sars-CoV-2 and flu > negative 2/7 BCx2 >> no growth at 2 days 2/7 UC >> 2/8 C diff >> negative  Antimicrobials:  Cefepime 2/7 > Vancomycin 2/7 > 2/9 Flagyl 2/7 >  Interim history/subjective:  O/N severe electrolyte abnormalities, pt tachy.  Nurse s/w family and they are switching him to comfort care today.  Objective   Blood pressure 104/86, pulse 97, temperature (!) 100.4 F (38 C), temperature source Axillary, resp. rate 19, height 5\' 10"  (1.778 m), weight 63 kg, SpO2 100 %.        Intake/Output Summary (Last 24 hours) at 08/18/2019 0944 Last data filed at 08/18/2019 0900 Gross per 24 hour  Intake 4815.79 ml  Output 3101 ml  Net 1714.79 ml   Filed Weights   08/16/19 0500 08/17/19 0500 08/18/19 0500  Weight: 53.7 kg 60.2 kg 63 kg   Physical Exam  General - non verbal, cachectic Cardiac - sinus  tachy Pulm - tachypneic, right lung CTA, no lung sounds Abdomen - soft, non tender, suprapubic catheter and colostomy in place Extremities - decreased muscle bulk Skin - pressure ulcers Neuro - not following commands   Resolved Hospital Problem list     Assessment & Plan:   Septic shock. Unclear etiology.  UTI vs. R knee cellulitis vs. Colitis vs. Pneumonia. -comfort care  AKI from hypovolemia. Hypernatremia. Hypokalemia, hypophosphatemia, hypomagnesemia. Non gap metabolic acidosis. -comfort care  Suprapubic catheter. - replaced by urology [id number]  Acute metabolic encephalopathy 2nd to uremia and sepsis. - monitor mental status  Severe protein calorie malnutrition with hx of chronic pancreatitis. - place cortrak and start tube feeds  DM type 2 poorly controlled. - SSI  Upper GI bleed likely from uremia induced gastritis. Hx of colostomy. - colostomy care  Anemia of critical illness and chronic disease.   Pressure injuries. - sacrum, present on admission - Lt upper back, present on admission - Rt ischial tuberosity, present on admission - Lt ischial tuberosity, present on admission - wound care  Best practice:  Diet: NPO DVT prophylaxis: SCDs GI prophylaxis: Protonix BID Mobility: Bedrest Code Status: DNR, no renal replacement Disposition: ICU  Labs    CMP Latest Ref Rng & Units 08/18/2019 08/17/2019 08/17/2019  Glucose 70 - 99 mg/dL 224(H) 258(H) 313(H)  BUN 8 - 23 mg/dL 57(H) 61(H) 71(H)  Creatinine 0.61 - 1.24 mg/dL 3.63(H) 3.72(H) 4.40(H)  Sodium 135 - 145  mmol/L 153(H) 154(H) 153(H)  Potassium 3.5 - 5.1 mmol/L 4.3 2.6(LL) 3.0(L)  Chloride 98 - 111 mmol/L 116(H) 114(H) 123(H)  CO2 22 - 32 mmol/L 19(L) 25 18(L)  Calcium 8.9 - 10.3 mg/dL 6.0(LL) 6.2(LL) 6.5(L)  Total Protein 6.5 - 8.1 g/dL 4.7(L) - -  Total Bilirubin 0.3 - 1.2 mg/dL 1.6(H) - -  Alkaline Phos 38 - 126 U/L 90 - -  AST 15 - 41 U/L 28 - -  ALT 0 - 44 U/L 18 - -    CBC Latest Ref Rng &  Units 08/18/2019 08/17/2019 08/16/2019  WBC 4.0 - 10.5 K/uL 32.0(H) 28.8(H) -  Hemoglobin 13.0 - 17.0 g/dL 12.8(L) 10.7(L) 11.4(L)  Hematocrit 39.0 - 52.0 % 37.0(L) 29.6(L) 33.0(L)  Platelets 150 - 400 K/uL 205 258 -    ABG    Component Value Date/Time   PHART 7.364 08/16/2019 1200   PCO2ART <19.0 (LL) 08/16/2019 1200   PO2ART 110 (H) 08/16/2019 1200   HCO3 6.7 (L) 08/16/2019 1200   TCO2 8 (L) 08/16/2019 0557   ACIDBASEDEF 18.3 (H) 08/16/2019 1200   O2SAT 98.3 08/16/2019 1200    CBG (last 3)  Recent Labs    08/17/19 2359 08/18/19 0357 08/18/19 0805  GLUCAP 110* 204* 224*    CC time 40 minutes  Mallie Darting, MS4

## 2019-08-18 NOTE — Progress Notes (Signed)
eLink Physician-Brief Progress Note Patient Name: Colin [name] DOB: 1941-03-27 MRN: [id number]   Date of Service  08/18/2019  HPI/Events of Note  Pt currently in sinus tachycardia  eICU Interventions  No intervention at this time.        Kerry Kass Ogan 08/18/2019, 6:24 AM

## 2019-08-18 NOTE — Progress Notes (Signed)
eLink Physician-Brief Progress Note Patient Name: Colin [name] DOB: 05-01-1941 MRN: [id number]   Date of Service  08/18/2019  HPI/Events of Note  Pt transitioned to comfort measures by family today.  eICU Interventions  I have discontinued all non-comfort measures orders.        Kerry Kass Daneka Lantigua 08/18/2019, 8:35 PM

## 2019-08-18 NOTE — Progress Notes (Signed)
CRITICAL VALUE ALERT  Critical Value:  Calcium 6.0  Date & Time Notied:  08/18/2019 @ [id number]  Provider Notified: Warren Lacy

## 2019-08-18 NOTE — Progress Notes (Signed)
eLink Physician-Brief Progress Note Patient Name: Colin [name] DOB: 1940/11/21 MRN: [id number]   Date of Service  08/18/2019  HPI/Events of Note  Mg++ 1.6, PHOS 2.0, Calcium 6.0, Albumin 1.7  eICU Interventions  Electrolyte replacement protocol ordered .        Meela Wareing U Izabelle Daus 08/18/2019, 5:00 AM

## 2019-08-20 LAB — CULTURE, BLOOD (ROUTINE X 2)
Culture: NO GROWTH
Culture: NO GROWTH
Special Requests: ADEQUATE
Special Requests: ADEQUATE

## 2019-09-06 NOTE — Progress Notes (Signed)
Patient expired at [place].  Witnessed by ascultation and two nurse verification: Rito Ehrlich, RN and Allegra Grana, RN.

## 2019-09-06 NOTE — Death Summary Note (Signed)
DEATH SUMMARY   Patient Details  Name: Colin [name] MRN: [id number] DOB: 08/06/1940  Admission/Discharge Information   Admit Date:  21-Aug-2019  Date of Death: Date of Death: 08/25/2019  Time of Death: Time of Death: 0157  Length of Stay: 4  Referring Physician: Redmond School, MD   Reason(s) for Hospitalization  septic shock  Diagnoses  Preliminary cause of death: Septic shock ([place]) Secondary Diagnoses (including complications and co-morbidities):  Active Problems:   Sepsis with acute renal failure and septic shock (HCC)   Acute kidney injury ([place])   Septic shock (HCC)   Encephalopathy acute   H/O prostatectomy   Colostomy present (HCC)   Knee effusion, right   Hypernatremia   Suprapubic catheter (HCC)   Hypothermia   Pressure injury of skin   Gastrointestinal hemorrhage   Protein-calorie malnutrition, severe  Of note pt was transitioned to comfort care the morning of my assumption of care. Please see hospital chart for complete details.  Brief Hospital Course (including significant findings, care, treatment, and services provided and events leading to death)  Colin [name] is a 79 y.o. year old male who has a past medical history significant for diabetes, CKD stage III, EtOH abuse in remission, IV drug abuse and admission, diabetes, prostate cancer, bowel obstruction status post diverting colostomy who lives alone.  Per son, he mostly stays in bed but will get up to perform ADLs and go to the store occasionally.  His dog died 2 weeks ago and he seemed more depressed, son went to see him on February 3rd and patient did not seem to be feeling very well.  He checked him on him again on February 6th and patient was able to eat but was not very energetic, at that time was alert and oriented.  The next day he was unable to reach patient by phone, so went to check on him and found him in bed covered in feces and minimally responsive brought into the emergency department.   In the ED,  patient was initially very hypotensive with blood pressure 50/30 and hypothermic.  Labs significant for significant metabolic acidosis, hyponatremia with sodium of 150, white blood cell count 29.8, lactic acid 0.4 and creatinine 5.15 above baseline of 1.2-1.6.  Covid-19 and respiratory viral panel were negative, chest x-ray without infiltrates, no urine obtained from Foley bag.  He was covered with Vanc, cefepime and Flagyl and given 30 cc/kg IV fluids and transferred to [facility] for ICU admission.  Unfortunately pt did not make improvements he was on escalating pressors. On the evening of 08/17/19 Dr Halford Chessman was able to make the family aware that the pt was continuing to decline and on the morning of 2/10 they had decided to pursue comfort measures. Once they were able to be at bedside pt was transitioned. He passed away peacefully and with dignity on 2/ 11.   Pertinent Labs and Studies  Significant Diagnostic Studies CT ABDOMEN PELVIS WO CONTRAST  Result Date: 08/16/2019 CLINICAL DATA:  Sepsis, CKD stage 3, ETOH abuse in remission, IV drug abuse EXAM: CT ABDOMEN AND PELVIS WITHOUT CONTRAST TECHNIQUE: Multidetector CT imaging of the abdomen and pelvis was performed following the standard protocol without IV contrast. COMPARISON:  CT February 10, 2019 FINDINGS: Lower chest: Multifocal patchy airspace disease in the lung bases most pronounced in the medial left lower lobe. Normal heart size. No pericardial effusion. Coronary artery calcifications are present. Hepatobiliary: Normal hepatic attenuation. No focal hepatic lesions. There is distention of the  gallbladder with a calcified gallstone within the gallbladder lumen. No pericholecystic fluid or inflammation. No biliary ductal dilatation. Pancreas: Diffuse parenchymal calcifications of the pancreas with larger coarse calcifications of the pancreatic head. No peripancreatic inflammation or visible ductal dilatation. Spleen: Normal in size without focal  abnormality. Adrenals/Urinary Tract: Normal adrenal glands. Mild bilateral symmetric perinephric stranding, a nonspecific finding though may correlate with either age or decreased renal function given similarity to comparison study. Calculus present in the left renal pelvis does not appear frankly obstructive in the absence of hydronephrosis. No visible or contour deforming renal lesions no frankly obstructive urolithiasis or hydronephrosis. Bladder is decompressed by a an inflated suprapubic catheter. Radiodense calcifications are present in the urinary bladder lumen which are similar to slightly increased in size from comparison study February 10, 2019. Stomach/Bowel: Distal esophagus, stomach and duodenal sweep are unremarkable. No proximal small bowel dilatation or wall thickening. Mild circumferential thickening of the terminal ileum with pericolonic stranding. The appendix is not visualized. Mild diffuse colonic wall thickening and pericolonic stranding. Findings extend to the level of the left lower quadrant end colostomy. There has been interval reduction of a peristomal hernia seen on comparison CT. There is a terminal colonic pouch which closely apposes the posterior wall of the urinary bladder but without focal inflammation of this segment of bowel. Vascular/Lymphatic: Atherosclerotic plaque within the normal caliber aorta. Some reactive mesenteric nodes are present. No pathologically enlarged abdominopelvic lymph nodes. Reproductive: Stranding and postsurgical changes in the prostatectomy bed, likely reflecting scarring. Other: Small amount of fluid within the left inguinal canal. Trace fluid also present within a tiny umbilical hernia as well. There is mid mesenteric stranding likely reflective of the bowel inflammation detailed above. No free fluid or free air is seen in the abdomen or pelvis. Left lower quadrant ostomy is detailed above. Mild circumferential body wall edema is noted. Musculoskeletal:  Multilevel degenerative changes are present in the imaged portions of the spine. No acute osseous abnormality or suspicious osseous lesion. IMPRESSION: 1. Multifocal patchy airspace disease in the lung bases most pronounced in the medial left lower lobe concerning for multifocal pneumonia. 2. Mild diffuse colonic wall thickening and pericolonic stranding extending to the level of the left lower quadrant end colostomy. Findings are compatible with enterocolitis which may be infectious or inflammatory in etiology. 3. Cholelithiasis without CT evidence of acute cholecystitis. 4. Changes of chronic pancreatitis. 5. Coarse radiodense calcifications seen at the bladder base, possibly intraluminal or postsurgical related to prior prostatectomy. 6. Terminal colonic pouch remains in close proximity of the posterior wall the bladder with history of prior fistula. 7. Interval reduction of a peristomal hernia seen on comparison CT. 8. Aortic Atherosclerosis (ICD10-I70.0). Electronically Signed   By: Lovena Le M.D.   On: 08/16/2019 04:50   CT HEAD WO CONTRAST  Result Date: 08/16/2019 CLINICAL DATA:  79 year old male with encephalopathy. EXAM: CT HEAD WITHOUT CONTRAST TECHNIQUE: Contiguous axial images were obtained from the base of the skull through the vertex without intravenous contrast. COMPARISON:  04/18/2017 head CT. FINDINGS: Brain: Low-density left subdural hematoma seen in 2018 is resolved. Some generalized cerebral volume loss is suspected since that time. No midline shift, mass effect, or evidence of intracranial mass lesion. No acute intracranial hemorrhage identified. No ventriculomegaly. There is conspicuous hypodensity in the right pons on 2 consecutive images (series 2, images 11 and 12) new since 2018. Elsewhere there is patchy cerebral white matter hypodensity which appears not significantly changed from 2018. No cortically based cerebral  hemisphere infarct or cortical encephalomalacia identified.  Vascular: Mild Calcified atherosclerosis at the skull base. No suspicious intracranial vascular hyperdensity. Skull: Negative. Sinuses/Orbits: There is some chronic sinus periosteal thickening but the paranasal sinuses and mastoids are clear. Other: No acute orbit or scalp soft tissue finding. IMPRESSION: 1. 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 Query new left side weakness. 2. No other acute intracranial abnormality identified. Electronically Signed   By: Genevie Ann M.D.   On: 08/16/2019 04:39   MR BRAIN WO CONTRAST  Result Date: 08/16/2019 CLINICAL DATA:  Neuro deficit, acute, stroke suspected. EXAM: MRI HEAD WITHOUT CONTRAST TECHNIQUE: Multiplanar, multiecho pulse sequences of the brain and surrounding structures were obtained without intravenous contrast. COMPARISON:  Head CT 08/16/2019, head CT 04/18/2017 FINDINGS: Brain: The examination is motion degraded, limiting evaluation. Most notably there is moderate motion degradation of the sagittal T1 weighted sequence, moderate motion degradation of the axial T2 weighted sequence, moderate motion degradation of the axial T1 weighted sequence and severe motion degradation of the coronal acquired T2 weighted sequence. There is no evidence of acute infarct. No evidence of intracranial mass. No midline shift or extra-axial fluid collection. No chronic intracranial blood products. No significant white matter disease for age. Moderate generalized parenchymal atrophy without lobar predominance. Vascular: Flow voids maintained within the proximal large arterial vessels. Skull and upper cervical spine: Within the limitations of motion degradation, no focal marrow lesion is identified. Sinuses/Orbits: Visualized orbits demonstrate no acute abnormality. Trace ethmoid sinus mucosal thickening. Left mastoid effusion. IMPRESSION: 1. Motion degraded and limited examination as described. 2. The diffusion-weighted imaging is of good  quality. No evidence of acute infarct or other acute intracranial abnormality. 3. No findings to account for the apparent right pontine infarct described on head CT performed earlier the same day. This was artifactual. 4. Moderate generalized parenchymal atrophy. 5. Left mastoid effusion. Electronically Signed   By: Kellie Simmering DO   On: 08/16/2019 21:14   DG Chest Port 1 View  Result Date: 08/09/2019 CLINICAL DATA:  Sepsis EXAM: PORTABLE CHEST 1 VIEW COMPARISON:  February 10, 2019 FINDINGS: The heart size and mediastinal contours are within normal limits. Aortic knob calcifications. A right-sided PICC seen with the tip at the superior cavoatrial junction. No pneumothorax or large airspace consolidation. No acute osseous abnormality. IMPRESSION: Right-sided PICC with the tip at the superior cavoatrial junction. Electronically Signed   By: Prudencio Pair M.D.   On: 08/31/2019 21:32   DG Knee Right Port  Result Date: 08/16/2019 CLINICAL DATA:  79 year old male with air a male a. EXAM: PORTABLE RIGHT KNEE - 1-2 VIEW COMPARISON:  Right knee series 07/15/2017. FINDINGS: AP and cross-table lateral views. Chronic right total knee arthroplasty. Increased lucency along the posterior margin of the femoral component. Evidence of small joint effusion. Hardware appears intact and normally aligned. Elsewhere Stable visualized osseous structures about the knee. IMPRESSION: 1. Nonspecific bone resorption along the posterior margin of the right femoral arthroplasty component since 2019. 2. Evidence of a small joint effusion. 3. No other acute radiographic finding. Electronically Signed   By: Genevie Ann M.D.   On: 08/16/2019 06:15   DG Abd Portable 1V  Result Date: 08/17/2019 CLINICAL DATA:  Encounter for feeding tube placement EXAM: PORTABLE ABDOMEN - 1 VIEW COMPARISON:  Portable exam 1150 hours compared to CT abdomen and pelvis of 08/16/2019 FINDINGS: Tip of feeding tube projects over distal gastric antrum. Nonobstructive bowel  gas pattern. Multiple pancreatic  calcifications consistent with chronic calcific pancreatitis. Bones demineralized. IMPRESSION: Tip of feeding tube projects over distal gastric antrum. Chronic calcific pancreatitis. Electronically Signed   By: Lavonia Dana M.D.   On: 08/17/2019 11:59    Microbiology Recent Results (from the past 240 hour(s))  Blood Culture (routine x 2)     Status: None (Preliminary result)   Collection Time: 09/05/2019  8:21 PM   Specimen: Left Antecubital; Blood  Result Value Ref Range Status   Specimen Description LEFT ANTECUBITAL  Final   Special Requests   Final    BOTTLES DRAWN AEROBIC AND ANAEROBIC Blood Culture adequate volume   Culture   Final    NO GROWTH 4 DAYS Performed at [facility], [address], [place], [place] [address]    Report Status PENDING  Incomplete  Blood Culture (routine x 2)     Status: None (Preliminary result)   Collection Time: 08/30/2019  8:21 PM   Specimen: Right Antecubital; Blood  Result Value Ref Range Status   Specimen Description RIGHT ANTECUBITAL  Final   Special Requests   Final    BOTTLES DRAWN AEROBIC AND ANAEROBIC Blood Culture adequate volume   Culture   Final    NO GROWTH 4 DAYS Performed at [facility], [address], [place], [place] [address]    Report Status PENDING  Incomplete  Respiratory Panel by RT PCR (Flu A&B, Covid) - Nasopharyngeal Swab     Status: None   Collection Time: 08/29/2019  8:25 PM   Specimen: Nasopharyngeal Swab  Result Value Ref Range Status   SARS Coronavirus 2 by RT PCR NEGATIVE NEGATIVE Final    Comment: (NOTE) SARS-CoV-2 target nucleic acids are NOT DETECTED. The SARS-CoV-2 RNA is generally detectable in upper respiratoy specimens during the acute phase of infection. The lowest concentration of SARS-CoV-2 viral copies this assay can detect is 131 copies/mL. A negative result does not preclude SARS-Cov-2 infection and should not be used as the sole basis for treatment or other patient  management decisions. A negative result may occur with  improper specimen collection/handling, submission of specimen other than nasopharyngeal swab, presence of viral mutation(s) within the areas targeted by this assay, and inadequate number of viral copies (<131 copies/mL). A negative result must be combined with clinical observations, patient history, and epidemiological information. The expected result is Negative. Fact Sheet for Patients:  PinkCheek.be Fact Sheet for Healthcare Providers:  GravelBags.it This test is not yet ap proved or cleared by the [place] FDA and  has been authorized for detection and/or diagnosis of SARS-CoV-2 by FDA under an Emergency Use Authorization (EUA). This EUA will remain  in effect (meaning this test can be used) for the duration of the COVID-19 declaration under Section 564(b)(1) of the Act, 21 U.S.C. section 360bbb-3(b)(1), unless the authorization is terminated or revoked sooner.    Influenza A by PCR NEGATIVE NEGATIVE Final   Influenza B by PCR NEGATIVE NEGATIVE Final    Comment: (NOTE) The Xpert Xpress SARS-CoV-2/FLU/RSV assay is intended as an aid in  the diagnosis of influenza from Nasopharyngeal swab specimens and  should not be used as a sole basis for treatment. Nasal washings and  aspirates are unacceptable for Xpert Xpress SARS-CoV-2/FLU/RSV  testing. Fact Sheet for Patients: PinkCheek.be Fact Sheet for Healthcare Providers: GravelBags.it This test is not yet approved or cleared by the [place] FDA and  has been authorized for detection and/or diagnosis of SARS-CoV-2 by  FDA under an Emergency Use Authorization (EUA). This  EUA will remain  in effect (meaning this test can be used) for the duration of the  Covid-19 declaration under Section 564(b)(1) of the Act, 21  U.S.C. section 360bbb-3(b)(1), unless the  authorization is  terminated or revoked. Performed at [facility], [address] [place], [place] [address]   C difficile quick scan w PCR reflex     Status: None   Collection Time: 08/16/19  8:44 AM   Specimen: STOOL  Result Value Ref Range Status   C Diff antigen NEGATIVE NEGATIVE Final   C Diff toxin NEGATIVE NEGATIVE Final   C Diff interpretation No C. difficile detected.  Final    Comment: Performed at [facility], [place] [address], [place], [place] [address]  MRSA PCR Screening     Status: None   Collection Time: 08/16/19  2:00 PM   Specimen: Nasal Mucosa; Nasopharyngeal  Result Value Ref Range Status   MRSA by PCR NEGATIVE NEGATIVE Final    Comment:        The GeneXpert MRSA Assay (FDA approved for NASAL specimens only), is one component of a comprehensive MRSA colonization surveillance program. It is not intended to diagnose MRSA infection nor to guide or monitor treatment for MRSA infections. Performed at [facility], [place] [address], [place], [place] [address]     Lab Basic Metabolic Panel: Recent Labs  Lab 08/16/19 0214 08/16/19 [id number] 08/16/19 [id number] 08/16/19 0844 08/16/19 1211 08/16/19 1211 08/16/19 1706 08/16/19 2227 08/17/19 0023 08/17/19 0352 08/17/19 1700 08/18/19 0358  NA  --    < >   < >  --  152*   < > 153* 148* 153*  --  154* 153*  K  --    < >   < >  --  3.5   < > 3.0* 2.7* 3.0*  --  2.6* 4.3  CL  --    < >   < >  --  126*   < > 125* 120* 123*  --  114* 116*  CO2  --    < >   < >  --  8*   < > 9* 14* 18*  --  25 19*  GLUCOSE  --    < >   < >  --  158*   < > 247* 291* 313*  --  258* 224*  BUN  --    < >   < >  --  77*   < > 75* 75* 71*  --  61* 57*  CREATININE  --    < >   < >  --  4.51*   < > 4.48* 4.32* 4.40*  --  3.72* 3.63*  CALCIUM  --    < >   < >  --  6.8*   < > 6.7* 6.4* 6.5*  --  6.2* 6.0*  MG 2.4  --   --   --  1.7  --   --   --   --  2.1 1.9 1.6*  PHOS  --   --   --  2.6  --   --   --   --   --  <1.0* 2.9 2.0*   < > =  values in this interval not displayed.   Liver Function Tests: Recent Labs  Lab 08/21/2019 2021 08/16/19 0739 08/18/19 0358  AST 22 35 28  ALT 20 19 18   ALKPHOS 153* 117 90  BILITOT 0.8 1.3* 1.6*  PROT 6.3* 4.8* 4.7*  ALBUMIN 2.8* 2.0* 1.7*   Recent Labs  Lab 08/16/19 0214  LIPASE 53*   Recent Labs  Lab 08/16/19 0214  AMMONIA 49*   CBC: Recent Labs  Lab 08/11/2019 2021 08/16/19 0203 08/16/19 0557 08/16/19 0844 08/16/19 1706 08/17/19 0352 08/18/19 0358  WBC 29.8*  --   --   --   --  28.8* 32.0*  NEUTROABS 25.4*  --   --   --   --   --   --   HGB 13.1   < > 10.2* 11.9* 11.4* 10.7* 12.8*  HCT 41.3   < > 30.0* 35.2* 33.0* 29.6* 37.0*  MCV 89.8  --   --   --   --  79.4* 81.0  PLT 476*  --   --   --   --  258 205   < > = values in this interval not displayed.   Cardiac Enzymes: Recent Labs  Lab 08/16/19 1211  CKTOTAL 665*   Sepsis Labs: Recent Labs  Lab 08/18/2019 2021 08/16/19 0237 08/17/19 0352 08/18/19 0358  WBC 29.8*  --  28.8* 32.0*  LATICACIDVEN 3.4* 1.8  --   --     Procedures/Operations  See chart   Audria Nine 07-Sep-2019, 1:55 PM

## 2019-09-06 NOTE — Progress Notes (Signed)
Patient's son Toluwalase Mitrovic called and updated of patient expiration.

## 2019-09-06 NOTE — Progress Notes (Signed)
15 CC of Fentanyl Wasted in Sink.  Witnessed by Tyrone Sage, RN

## 2019-09-06 DEATH — deceased
